# Patient Record
Sex: Female | Born: 1940
Health system: Southern US, Community
[De-identification: ages and names within clinical notes are randomized; demographics above are authoritative.]

## PROBLEM LIST (undated history)

## (undated) DIAGNOSIS — R102 Pelvic and perineal pain unspecified side: Secondary | ICD-10-CM

## (undated) DIAGNOSIS — M5136 Other intervertebral disc degeneration, lumbar region: Secondary | ICD-10-CM

## (undated) DIAGNOSIS — M5126 Other intervertebral disc displacement, lumbar region: Secondary | ICD-10-CM

## (undated) DIAGNOSIS — M51369 Other intervertebral disc degeneration, lumbar region without mention of lumbar back pain or lower extremity pain: Secondary | ICD-10-CM

## (undated) DIAGNOSIS — E039 Hypothyroidism, unspecified: Secondary | ICD-10-CM

## (undated) DIAGNOSIS — K219 Gastro-esophageal reflux disease without esophagitis: Secondary | ICD-10-CM

## (undated) DIAGNOSIS — E7800 Pure hypercholesterolemia, unspecified: Secondary | ICD-10-CM

## (undated) DIAGNOSIS — T148XXA Other injury of unspecified body region, initial encounter: Secondary | ICD-10-CM

## (undated) DIAGNOSIS — M8580 Other specified disorders of bone density and structure, unspecified site: Secondary | ICD-10-CM

## (undated) HISTORY — DX: Other specified disorders of bone density and structure, unspecified site: M85.80

## (undated) HISTORY — DX: Pure hypercholesterolemia, unspecified: E78.00

## (undated) HISTORY — PX: BLADDER SUSPENSION: SHX72

## (undated) HISTORY — DX: Hypothyroidism, unspecified: E03.9

---
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
## 1999-11-11 ENCOUNTER — Encounter: Payer: Self-pay | Admitting: *Deleted

## 1999-11-11 ENCOUNTER — Inpatient Hospital Stay (HOSPITAL_COMMUNITY): Admission: EM | Admit: 1999-11-11 | Discharge: 1999-11-12 | Payer: Self-pay | Admitting: *Deleted

## 2001-02-25 ENCOUNTER — Ambulatory Visit (HOSPITAL_COMMUNITY): Admission: RE | Admit: 2001-02-25 | Discharge: 2001-02-25 | Payer: Self-pay | Admitting: Family Medicine

## 2001-02-25 ENCOUNTER — Encounter: Payer: Self-pay | Admitting: Family Medicine

## 2001-03-04 ENCOUNTER — Other Ambulatory Visit: Admission: RE | Admit: 2001-03-04 | Discharge: 2001-03-04 | Payer: Self-pay | Admitting: Family Medicine

## 2001-04-17 ENCOUNTER — Encounter: Payer: Self-pay | Admitting: Family Medicine

## 2001-04-17 ENCOUNTER — Ambulatory Visit (HOSPITAL_COMMUNITY): Admission: RE | Admit: 2001-04-17 | Discharge: 2001-04-17 | Payer: Self-pay | Admitting: Family Medicine

## 2001-07-07 ENCOUNTER — Ambulatory Visit (HOSPITAL_COMMUNITY): Admission: RE | Admit: 2001-07-07 | Discharge: 2001-07-07 | Payer: Self-pay | Admitting: General Surgery

## 2002-02-19 ENCOUNTER — Encounter: Payer: Self-pay | Admitting: Family Medicine

## 2002-02-19 ENCOUNTER — Ambulatory Visit (HOSPITAL_COMMUNITY): Admission: RE | Admit: 2002-02-19 | Discharge: 2002-02-19 | Payer: Self-pay | Admitting: Family Medicine

## 2002-02-25 ENCOUNTER — Encounter: Payer: Self-pay | Admitting: Family Medicine

## 2002-02-25 ENCOUNTER — Ambulatory Visit (HOSPITAL_COMMUNITY): Admission: RE | Admit: 2002-02-25 | Discharge: 2002-02-25 | Payer: Self-pay | Admitting: Family Medicine

## 2002-03-24 ENCOUNTER — Encounter: Admission: RE | Admit: 2002-03-24 | Discharge: 2002-03-24 | Payer: Self-pay | Admitting: Neurosurgery

## 2002-03-24 ENCOUNTER — Encounter: Payer: Self-pay | Admitting: Neurosurgery

## 2002-04-13 ENCOUNTER — Encounter: Payer: Self-pay | Admitting: Neurosurgery

## 2002-04-13 ENCOUNTER — Encounter: Admission: RE | Admit: 2002-04-13 | Discharge: 2002-04-13 | Payer: Self-pay | Admitting: Neurosurgery

## 2003-05-09 ENCOUNTER — Encounter: Payer: Self-pay | Admitting: Family Medicine

## 2003-05-09 ENCOUNTER — Ambulatory Visit (HOSPITAL_COMMUNITY): Admission: RE | Admit: 2003-05-09 | Discharge: 2003-05-09 | Payer: Self-pay | Admitting: Family Medicine

## 2003-09-01 ENCOUNTER — Ambulatory Visit (HOSPITAL_COMMUNITY): Admission: RE | Admit: 2003-09-01 | Discharge: 2003-09-01 | Payer: Self-pay | Admitting: Pulmonary Disease

## 2003-09-05 ENCOUNTER — Emergency Department (HOSPITAL_COMMUNITY): Admission: EM | Admit: 2003-09-05 | Discharge: 2003-09-05 | Payer: Self-pay | Admitting: Emergency Medicine

## 2004-07-04 ENCOUNTER — Ambulatory Visit (HOSPITAL_COMMUNITY): Admission: RE | Admit: 2004-07-04 | Discharge: 2004-07-04 | Payer: Self-pay | Admitting: Family Medicine

## 2005-07-10 ENCOUNTER — Ambulatory Visit (HOSPITAL_COMMUNITY): Admission: RE | Admit: 2005-07-10 | Discharge: 2005-07-10 | Payer: Self-pay | Admitting: Pulmonary Disease

## 2005-08-20 ENCOUNTER — Other Ambulatory Visit: Admission: RE | Admit: 2005-08-20 | Discharge: 2005-08-20 | Payer: Self-pay | Admitting: Obstetrics and Gynecology

## 2006-08-28 ENCOUNTER — Ambulatory Visit (HOSPITAL_COMMUNITY): Admission: RE | Admit: 2006-08-28 | Discharge: 2006-08-28 | Payer: Self-pay | Admitting: Pulmonary Disease

## 2007-08-31 ENCOUNTER — Ambulatory Visit (HOSPITAL_COMMUNITY): Admission: RE | Admit: 2007-08-31 | Discharge: 2007-08-31 | Payer: Self-pay | Admitting: Family Medicine

## 2007-09-16 ENCOUNTER — Ambulatory Visit (HOSPITAL_COMMUNITY): Admission: RE | Admit: 2007-09-16 | Discharge: 2007-09-16 | Payer: Self-pay | Admitting: Family Medicine

## 2007-11-12 ENCOUNTER — Inpatient Hospital Stay (HOSPITAL_COMMUNITY): Admission: RE | Admit: 2007-11-12 | Discharge: 2007-11-13 | Payer: Self-pay | Admitting: Obstetrics and Gynecology

## 2007-12-26 ENCOUNTER — Emergency Department (HOSPITAL_COMMUNITY): Admission: EM | Admit: 2007-12-26 | Discharge: 2007-12-27 | Payer: Self-pay | Admitting: Emergency Medicine

## 2008-01-22 ENCOUNTER — Ambulatory Visit (HOSPITAL_COMMUNITY): Admission: RE | Admit: 2008-01-22 | Discharge: 2008-01-22 | Payer: Self-pay | Admitting: Orthopedic Surgery

## 2008-01-25 ENCOUNTER — Encounter (HOSPITAL_COMMUNITY): Admission: RE | Admit: 2008-01-25 | Discharge: 2008-02-24 | Payer: Self-pay | Admitting: Orthopedic Surgery

## 2008-02-05 ENCOUNTER — Ambulatory Visit (HOSPITAL_COMMUNITY): Admission: RE | Admit: 2008-02-05 | Discharge: 2008-02-05 | Payer: Self-pay | Admitting: Orthopedic Surgery

## 2008-09-07 ENCOUNTER — Ambulatory Visit (HOSPITAL_COMMUNITY): Admission: RE | Admit: 2008-09-07 | Discharge: 2008-09-07 | Payer: Self-pay | Admitting: Obstetrics and Gynecology

## 2008-12-26 ENCOUNTER — Ambulatory Visit (HOSPITAL_COMMUNITY): Admission: RE | Admit: 2008-12-26 | Discharge: 2008-12-26 | Payer: Self-pay | Admitting: Family Medicine

## 2009-10-02 ENCOUNTER — Ambulatory Visit (HOSPITAL_COMMUNITY): Admission: RE | Admit: 2009-10-02 | Discharge: 2009-10-02 | Payer: Self-pay | Admitting: Family Medicine

## 2009-11-16 ENCOUNTER — Ambulatory Visit (HOSPITAL_COMMUNITY): Admission: RE | Admit: 2009-11-16 | Discharge: 2009-11-16 | Payer: Self-pay | Admitting: Urology

## 2009-11-26 ENCOUNTER — Emergency Department (HOSPITAL_COMMUNITY): Admission: EM | Admit: 2009-11-26 | Discharge: 2009-11-26 | Payer: Self-pay | Admitting: Emergency Medicine

## 2009-11-27 ENCOUNTER — Ambulatory Visit (HOSPITAL_COMMUNITY): Admission: RE | Admit: 2009-11-27 | Discharge: 2009-11-27 | Payer: Self-pay | Admitting: Emergency Medicine

## 2010-01-24 ENCOUNTER — Encounter: Admission: RE | Admit: 2010-01-24 | Discharge: 2010-01-24 | Payer: Self-pay | Admitting: Obstetrics and Gynecology

## 2010-09-30 ENCOUNTER — Encounter: Payer: Self-pay | Admitting: Pulmonary Disease

## 2010-11-13 ENCOUNTER — Other Ambulatory Visit (HOSPITAL_COMMUNITY): Payer: Self-pay | Admitting: Family Medicine

## 2010-11-13 DIAGNOSIS — M8580 Other specified disorders of bone density and structure, unspecified site: Secondary | ICD-10-CM

## 2010-11-20 ENCOUNTER — Ambulatory Visit (HOSPITAL_COMMUNITY)
Admission: RE | Admit: 2010-11-20 | Discharge: 2010-11-20 | Disposition: A | Discharge status: Home / Self Care | Payer: Medicare Other | Source: Ambulatory Visit | Attending: Family Medicine | Admitting: Family Medicine

## 2010-11-20 DIAGNOSIS — M8580 Other specified disorders of bone density and structure, unspecified site: Secondary | ICD-10-CM

## 2010-11-20 DIAGNOSIS — M899 Disorder of bone, unspecified: Secondary | ICD-10-CM | POA: Insufficient documentation

## 2010-11-20 DIAGNOSIS — Z780 Asymptomatic menopausal state: Secondary | ICD-10-CM | POA: Insufficient documentation

## 2010-11-30 ENCOUNTER — Other Ambulatory Visit (HOSPITAL_COMMUNITY): Payer: Self-pay | Admitting: Family Medicine

## 2010-11-30 DIAGNOSIS — Z139 Encounter for screening, unspecified: Secondary | ICD-10-CM

## 2010-12-03 ENCOUNTER — Ambulatory Visit (HOSPITAL_COMMUNITY)
Admission: RE | Admit: 2010-12-03 | Discharge: 2010-12-03 | Disposition: A | Discharge status: Home / Self Care | Payer: Medicare Other | Source: Ambulatory Visit | Attending: Family Medicine | Admitting: Family Medicine

## 2010-12-03 DIAGNOSIS — Z139 Encounter for screening, unspecified: Secondary | ICD-10-CM

## 2010-12-03 DIAGNOSIS — Z1231 Encounter for screening mammogram for malignant neoplasm of breast: Secondary | ICD-10-CM | POA: Insufficient documentation

## 2010-12-03 LAB — DIFFERENTIAL
Basophils Absolute: 0 10*3/uL (ref 0.0–0.1)
Basophils Relative: 1 % (ref 0–1)
Eosinophils Absolute: 0.2 10*3/uL (ref 0.0–0.7)
Eosinophils Relative: 3 % (ref 0–5)
Monocytes Absolute: 0.6 10*3/uL (ref 0.1–1.0)
Monocytes Relative: 11 % (ref 3–12)
Neutro Abs: 2.7 10*3/uL (ref 1.7–7.7)

## 2010-12-03 LAB — BASIC METABOLIC PANEL
CO2: 25 mEq/L (ref 19–32)
Calcium: 9.7 mg/dL (ref 8.4–10.5)
Chloride: 101 mEq/L (ref 96–112)
GFR calc Af Amer: >60 mL/min (ref >60)
Glucose, Bld: 91 mg/dL (ref 70–99)
Sodium: 138 mEq/L (ref 135–145)

## 2010-12-03 LAB — HEPATIC FUNCTION PANEL
Albumin: 4.3 g/dL (ref 3.5–5.2)
Alkaline Phosphatase: 40 U/L (ref 39–117)
Bilirubin, Direct: 0.1 mg/dL (ref 0.0–0.3)
Indirect Bilirubin: 0.9 mg/dL (ref 0.3–0.9)
Total Bilirubin: 1 mg/dL (ref 0.3–1.2)

## 2010-12-03 LAB — CBC
Hemoglobin: 14.2 g/dL (ref 12.0–15.0)
MCHC: 34.6 g/dL (ref 30.0–36.0)
MCV: 91 fL (ref 78.0–100.0)
RBC: 4.51 MIL/uL (ref 3.87–5.11)
RDW: 12.5 % (ref 11.5–15.5)

## 2010-12-03 LAB — LIPASE, BLOOD: Lipase: 24 U/L (ref 11–59)

## 2010-12-03 LAB — POCT CARDIAC MARKERS: Troponin i, poc: <0.05 ng/mL (ref 0.00–0.09)

## 2011-01-22 NOTE — Op Note (Signed)
NAME:  Terry Allen, Terry Allen         ACCOUNT NO.:  [id number]   MEDICAL RECORD NO.:  [id number]          PATIENT TYPE:  OIB   LOCATION:  9302                          FACILITY:  [facility]   PHYSICIAN:  Daleen Bo. Gaetano Net, M.D. DATE OF BIRTH:  20-May-1941   DATE OF PROCEDURE:  DATE OF DISCHARGE:                               OPERATIVE REPORT   PREOPERATIVE DIAGNOSES:  1. Pelvic relaxation.  2. Stress urinary incontinence   POSTOPERATIVE DIAGNOSES:  1. Pelvic relaxation.  2. Stress urinary incontinence.   PROCEDURE:  Anterior vaginal repair with Pinnacle graft, transobturator  mid urethral tape, posterior vaginal repair, and colpopexy.   SURGEON:  Everlene Farrier, MD.   ASSISTANT:  Evette Cristal, MD.   ANESTHESIA:  General with LMA.   ESTIMATED BLOOD LOSS:  100 mL.   INDICATIONS AND CONSENT:  The patient is a 70 year old divorced white  female G1, P1 with complaints of leaking urine as well as discomfort  vaginally.  Details are dictated in the history and physical.  Anterior-  posterior vaginal repair with grafts and a mid urethral sling is  discussed preoperatively.  Potential risks and complications have been  reviewed preoperatively including but not limited to infection, organ  damage, bleeding requiring transfusion of blood products with possible  HIV and hepatitis acquisition, DVT, PE, pneumonia, delayed healing,  erosion, vaginal narrowing, pelvic pain, recurrent relaxation, recurrent  stress urinary incontinence, perineal pain.  Possible prolonged  catheterization and need to return to the OR has also been reviewed.  All questions have been answered and consent is signed on the chart.   PROCEDURE:  The patient is taken to operating room, where she is  identified, placed in dorsal supine position and general anesthesia is  induced via LMA.  She is then placed in the dorsal lithotomy position,  where she is prepped, the bladder is straight catheterized and she is  draped in a  sterile fashion.  The anterior vaginal wall is injected with  0.50% lidocaine with 1:200,000 epinephrine.  The midline incision is  then made approximately 4 cm below the urethral meatus down to a point  about 2 cm superior to the cervix.  This dissection is carried out  bilaterally sharply and bluntly.  The ischial spines and the  sacrospinous ligament can be palpated bilaterally.  Then using the  Pinnacle graft with the Capio needle passer, the graft is passed through  the sacrospinous ligaments bilaterally.  This was done at least 1  fingerbreadth away from the spine.  The white arms are then passed  bilaterally through the white line approximately 1 fingerbreadths away  from the ischial spine as well.  These respective anchors are then  pulled through the tissue which passes the arms of the graft as well.  Prior to placement the posterior half of the graft had been trimmed  away.  After the graft is seated in place, a single 0 Monocryl stitch is  used to anchor the superior portion of the graft in the midline to the  vaginal mucosa.  The distal portion of the graft is trimmed to fit as  well and a single  suture is used in the midline as well distally to hold  it in place.  After it is properly seated, the sheaths are removed.  Excess polypropylene on the arms is trimmed.  The vaginal mucosa is then  closed with a running locking 2-0 Monocryl suture.  A Foley catheter is  placed in the bladder.  The bladder is drained and the catheter is left  in place.  The vaginal mucosa below the urethra, as well as the perineum  at the injection sites, are injected with the same solution.  The  suburethral midline incision is made.  Dissection is carried out  bilaterally to the urogenital diaphragm.  Then using the halo needle,  the needles were passed through the obturator foramen with the passage  of the needle tip being guided by the examining finger bilaterally.  After passage of the needles the  Foley catheter is removed.  Cystoscopy  is then carried out with a 70-degree scope.  Inspection in a 360-degree  manner reveals the bladder to be intact with no evidence of perforation  or foreign body.  A good puff of urine is noted bilaterally from the  ureters.  The cystoscope is removed.  Foley catheter is replaced.  The  bladder is drained and the catheter is left in place.  The polypropylene  mesh is then attached to the needles and they are withdrawn bilaterally  through the perineal incisions.  The sheath is then removed.  Careful  examination reveals zero tension on the graft.  It is flat and there is  at least 2-3 mm below the urethra to the graft.  Excess arms are trimmed  at the level of the skin.  The vaginal mucosa is closed in a running  locking fashion with 2-0 Monocryl suture.  Dermabond is placed on the  skin incisions.  Examination posteriorly revealed some laxity to the  posterior rectovaginal septum.  A small wedge of tissue is then removed  from the perineal body.  Dissection is carried out the midline and  bilaterally, and 0 Monocryl suture is used to plicate the fascia in the  midline.  The mucosa is closed again in a running fashion with locking 2-  0 Monocryl suture.  One inch of vaginal packing with estrogen cream is  placed.  All counts are correct.  The patient is awakened, taken to  recovery room in stable condition.      Daleen Bo Gaetano Net, M.D.  Electronically Signed     JET/MEDQ  D:  11/11/2007  T:  11/11/2007  Job:  [id number]

## 2011-01-22 NOTE — H&P (Signed)
NAME:  Terry Allen, Terry Allen         ACCOUNT NO.:  [id number]   MEDICAL RECORD NO.:  [id number]          PATIENT TYPE:  AMB   LOCATION:  [place]                           FACILITY:  [facility]   PHYSICIAN:  Daleen Bo. Gaetano Net, M.D. DATE OF BIRTH:  Jul 08, 1941   DATE OF ADMISSION:  11/11/2007  DATE OF DISCHARGE:                              HISTORY & PHYSICAL   CHIEF COMPLAINT:  Pelvic relaxation and urinary incontinence.   HISTORY OF PRESENT ILLNESS:  This patient is a 70 year old, divorced,  white female, G1, P1 who complains of leaking urine with coughing or  sneezing.  Urodynamic studies are consistent with stress urinary  incontinence.  On examination, she also is found to have pelvic  relaxation.  After discussion of options, she is being admitted for  anterior and posterior colporrhaphy with grafts and a TOT mid urethral  sling.  The potential risks and complications have been discussed  preoperatively.   PAST MEDICAL HISTORY:  1. Hyperlipidemia.  2. Hypothyroidism.   PAST SURGICAL HISTORY:  Negative.   FAMILY HISTORY:  Positive for diabetes in mother, cancer in father,  chronic hypertension in mother, stroke and heart disease in parents,  lung disease in father.   MEDICATIONS:  1. Simvastatin 40 mg daily.  2. Levothyroxine 75 mcg daily.   ALLERGIES:  No known drug allergies.   SOCIAL HISTORY:  Denies tobacco, alcohol or drug abuse   REVIEW OF SYSTEMS:  NEURO:  Denies headache.  CARDIO:  Denies chest  pain.  PULMONARY:  Denies shortness of breath. GI:  Denies recent  changes in bowel habits.   PHYSICAL EXAMINATION:  Height 5 feet 1-3/4 inches, weight 131 pounds,  blood pressure 136/80.  HEENT:  Without thyromegaly.  LUNGS:  Clear to auscultation.  HEART:  Regular rate and rhythm.  BACK:  Without CVA tenderness.  BREASTS:  Without mass or discharge.  ABDOMEN:  Soft, nontender without masses.  PELVIC:  Vulva, vagina and cervix without lesion.  The anterior vaginal  wall  presents at the vaginal introitus.  The uterus is normal size,  mobile with fair support.  Adnexa nontender without masses.  Rectovaginal exam reveals adequate rectal sphincter tone.  Rectovaginal  septum is attenuated.  EXTREMITIES:  Grossly within normal limits.  NEUROLOGICAL:  Grossly within normal limits.   ASSESSMENT:  Pelvic relaxation and stress urinary continence.   PLAN:  Anterior-posterior vaginal repair with graft and a transobturator  mid urethral sling.      Daleen Bo Gaetano Net, M.D.  Electronically Signed     JET/MEDQ  D:  11/04/2007  T:  11/05/2007  Job:  [id number]

## 2011-01-22 NOTE — Discharge Summary (Signed)
NAME:  Terry Allen, Terry Allen         ACCOUNT NO.:  [id number]   MEDICAL RECORD NO.:  [id number]          PATIENT TYPE:  INP   LOCATION:  9302                          FACILITY:  [facility]   PHYSICIAN:  Daleen Bo. Gaetano Net, M.D. DATE OF BIRTH:  June 01, 1941   DATE OF ADMISSION:  11/11/2007  DATE OF DISCHARGE:  11/13/2007                               DISCHARGE SUMMARY   ADMITTING DIAGNOSIS:  1. Pelvic relaxation.  2. Stress urinary continence.   DISCHARGE DIAGNOSIS:  1. Pelvic relaxation.  2. Stress urinary continence.   PROCEDURE:  On November 11, 2007 anterior colporrhaphy with pinnacle graft,  transobturator mid urethral sling, posterior colporrhaphy and colpopexy.   REASON FOR ADMISSION:  The patient is 69 year old white female with  symptoms of stress urinary continence and pelvic relaxation.  The  details are dictated in the history and physical.  She is admitted for  definitive surgical management.   HOSPITAL COURSE:  The patient is admitted to the hospital and undergoes  the above procedure.  On the evening of surgery, she has good pain  control, stable vital signs with clear urine output.  On the first  postoperative day, she is tolerating a regular diet and passing flatus  with good pain control.  Hemoglobin is 11.6.  Foley catheter was out  that morning.  Vital signs are stable and she is afebrile.  At one point  during the day, she was unable to void.  She was straight catheterized  for approximately 600 mL of urine.  However, following that she has done  well with all residual under 200 mL. She feels good.  She is ambulating  well, tolerating a regular diet. Vital signs remain stable and she is  afebrile.   CONDITION ON DISCHARGE:  Good.   DIET:  Regular as tolerated.   ACTIVITY:  No lifting, no operation of automobiles, no vaginal entry.  She is to call the office for problems including but not limited to  temperature of 101 degrees, heavy vaginal bleeding, persistent  nausea,  vomiting or increasing pain.  Double voiding instructions were reviewed  as well.   MEDICATIONS:  1. Ibuprofen 600 mg q.6 h p.r.n.  2. Percocet 5/325 #40 one to two p.o. q.6 h p.r.n.  3. Colace daily.  4. Multivitamin.   Follow-up is in the office in 2 weeks.      Daleen Bo Gaetano Net, M.D.  Electronically Signed     JET/MEDQ  D:  11/13/2007  T:  11/14/2007  Job:  [id number]

## 2011-01-25 NOTE — Cardiovascular Report (Signed)
[place]. [facility]  Patient:    Terry Allen, Terry Allen                 MRN: [id number] Proc. Date: 11/12/99 Adm. Date:  [id number] Disc. Date: [id number] Attending:  Octavia Heir CC:         Octavia Heir, M.D.             Leslie Andrea, M.D.                        Cardiac Catheterization  PROCEDURES: 1. Left heart catheterization. 2. Coronary angiography. 3. Left ventriculogram.  COMPLICATIONS:  None.  INDICATIONS:  Ms. Niang is a 70 year old white female with a history of hypercholesterolemia and positive family history of CAD, who presented to [facility] Room on November 11, 1999, complaining of substernal chest pain radiating into her left neck.  This was unrelieved with sublingual nitroglycerin. She had no ECG changes.  She was started on IV heparin, given O2 and IV morphine with the relief of her pain.  She is now transferred for cardiac catheterization.  DESCRIPTION OF PROCEDURE:  After given informed written consent, the patient was brought to the cardiac catheterization lab where her right and left groins were  shaved, prepped, and draped in the usual sterile fashion.  ECG monitoring was established.  Using modified Seldinger technique, a #6 French arterial sheath was inserted into the right femoral artery.  A 6 French diagnostic catheter was then used to perform diagnostic angiography.  This revealed a medium sized left main with no significant disease.  The LAD is a medium sized vessel which coursed to the apex and gave rise to two  diagonal branches.  The LAD was noted to have a 20% mid vessel stenotic lesion ust after the takeoff of the second diagonal.  The first and second diagonals are medium sized vessels with no significant disease.  The left circumflex is a medium sized vessel which coursed in the AV groove that gave rise to one obtuse marginal branch.  The AV groove circumflex has no significant  disease.  The first OM is a medium sized vessel with no significant  disease.  The right coronary artery is a medium sized vessel which is dominant and gives ise to both the PDA as well as the posterolateral branch.  The RCA, PDA, and posterolateral branch had no significant disease.  LEFT VENTRICULOGRAM:  The left ventriculogram reveals a preserved EF calculated at 60%.  There is no significant MR.  HEMODYNAMICS:  Systemic arterial pressure 106/58, LV systemic pressure 106/110.  LVEDP of 16.  A Perclose device was then used to close the right femoral artery without complications.  Ancef 1 g was given prophylactically.  CONCLUSIONS: 1. Essentially normal coronary arteries. 2. Normal left ventricular systolic function. 3. Successful closure of the right femoral artery using a Perclose device. DD:  11/12/99 TD:  11/13/99 Job: [id number]

## 2011-01-25 NOTE — H&P (Signed)
[place]. [facility]  Patient:    Terry Allen, Terry Allen                   MRN: [id number] Adm. Date:  11/11/99 Attending:  Alla German, M.D. Dictator:   Otilio Carpen. Dorene Ar, F.N.P.C. CC:         Alla German, M.D.             Dr. Joen Laura, [place], [place]                         History and Physical  CHIEF COMPLAINT:  Chest pain.  HISTORY OF PRESENT ILLNESS:  The patient was transferred from [facility] after presenting to the emergency room there earlier today.  She is a 70 year old white female with treatment of high cholesterol and positive family history of coronary artery disease who presented to [facility] Emergency Room this evening with substernal chest pain and radiation to the left neck.  Resolved with sublingual nitroglycerin.  She had no EKG changes.  The patient was seen in the  office by Dr. Tami Ribas earlier this week with a normal EKG and scheduled for outpatient echocardiogram and Cardiolite for tomorrow.  On 11/11/99, the patient developed pain, lasted approximately two hours and resolved spontaneously.  The pain returned at noon, and the patient presented to the ER t that time.  She had no associated symptoms, was started on heparin and nitroglycerin.  No previous history of coronary disease, no congestive heart failure.  She has had chest discomfort off and on for two weeks, but more frequent episodes this past week.  OUTPATIENT MEDICATIONS: 1. Evista 60 mg daily. 2. Aspirin 325 mg daily. 3. Zocor 20 mg daily. 4. Synthroid 0.075 mg daily. 5. Calcium. 6. Vitamin E.  ALLERGIES:  No known drug allergies.  SOCIAL HISTORY:  Married.  Does not use tobacco or alcohol.  Employed at [employer].  FAMILY HISTORY:  Mother died in her 28s with myocardial infarction and cerebrovascular accident.  PHYSICAL EXAMINATION:  VITAL SIGNS:  Temperature 98.8, pulse 74, respirations 20, blood  pressure 135/72, weight 58.1 kg.  GENERAL:  Alert and oriented white female in no acute distress.  HEENT:  Pupils are equal, round and reactive to light.  Extraocular movements intact.  Pharynx clear.  NECK:  Supple, no JVD, no bruits.  CHEST:  Clear to auscultation bilaterally.  HEART:  Regular rate and rhythm, S1, S2, no S3 or gallop.  ABDOMEN:  Soft, nontender, positive bowel sounds, negative for pulsatile masses.  EXTREMITIES:  No edema, 2+ pedal pulses bilaterally.  LABORATORY DATA:  EKG showed sinus rhythm, a rate of 77, no ST or T-wave changes, questionable old myocardial infarction.  Pleural chest x-ray pending. Hemoglobin 13, hematocrit 40, platelets 249, white blood cell count 6.2, sodium 140, potassium 4.1, BUN 16, creatinine 0.7, glucose 86.  CK 55, troponin-I was negative, MB was 1.  IMPRESSION: 1. Chest pain, rule out cardiac disease, rule out myocardial infarction. 2. Positive hyperlipidemia. 3. Positive family history.  PLAN:  IV heparin and IV nitroglycerin.  Plan for a cardiac catheterization in he morning.  Dr. Tami Ribas saw her and assessed her. DD:  11/11/99 TD:  11/11/99 Job: [id number]

## 2011-01-25 NOTE — Discharge Summary (Signed)
[place]. [facility]  Patient:    Terry Allen, Terry Allen                 MRN: [id number] Adm. Date:  [id number] Disc. Date: [id number] Attending:  Octavia Heir Dictator:   Otilio Carpen. Dorene Ar, F.N.P.C. CC:         Alla German, M.D.             Leslie Andrea, M.D., c/o [facility], [place],                           Discharge Summary  DISCHARGE DIAGNOSES: 1. Chest pain.    a. Negative for myocardial infarction.    b. Normal coronary arteries.    c. Normal left ventricular function. 2. Hyperlipidemia. 3. Positive family history for coronary disease.  DISCHARGE CONDITION:  Improved.  PROCEDURE:  November 12, 1999, combined left heart catheterization by Dr. Alla German.  DISCHARGE MEDICATIONS: 1. Prevacid 30 mg one tablet twice a day. 2. Zocor 20 mg one every evening. 3. Evista 60 mg every evening. 4. Synthroid 75 mcg daily. 5. Hold aspirin for now until you see Dr. Leslie Andrea. 6. Vitamin E as before. 7. Darvocet-N 100 one or two every four to six hours as needed for pain.  DISCHARGE INSTRUCTIONS: 1. No strenuous activity, no lifting over 10 pounds and no sexual activity for    three days. 2. Low-fat, low-salt diet. 3. May shower.  If any bleeding, swelling or drainage at cath site, call the    office.  Do not take tub baths for one week. 4. Proceed with echocardiogram as previously instructed, Thursday at 11 a.m. 5. Follow up with Dr. Karie Kirks this week for possible referral to GI    physician.  HISTORY OF PRESENT ILLNESS:  Patient was transferred from [facility] after presenting there and to the emergency room on November 11, 1999.  She is a 70 year old white female with treatment of high cholesterol and strong family history of coronary disease.  She had seen Dr. Alla German earlier the week of admission with a normal EKG and was scheduled for outpatient echocardiogram and Cardiolite.  On the morning of November 11, 1999, she developed chest pain lasting two hours and resolved spontaneously.  The pain then returned at noon and the patient presented to the ER at [facility].  No associated symptoms.  She was started on IV heparin and IV nitroglycerin.  She had no previous history of coronary disease and no congestive failure.  She has had chest discomfort off and on for two weeks, but more frequent episodes this past week.  OUTPATIENT MEDICATIONS: 1. Evista 60 mg daily. 2. Aspirin 325 mg daily. 3. Zocor 20 mg daily. 4. Synthroid 75 mcg daily. 5. Calcium daily. 6. Vitamin E daily.  ALLERGIES:  No known allergies.  SOCIAL HISTORY AND FAMILY HISTORY:  Please see H&P.  PHYSICAL EXAMINATION AT DISCHARGE:  Blood pressure 104/64, pulse 68, respirations 20, temperature 97.  Room air oxygen saturation 95%.  GENERAL: Alert and oriented white female in no acute distress.  SKIN:  Warm and dry with brisk capillary refill.  LUNGS:  Clear, without rales, rhonchi or wheezing.  ABDOMEN:  Right groin wound is stable.  No hematoma.  EXTREMITIES: Pedals 2+ bilaterally.  She had ambulated without difficulty.  LABORATORY AND X-RAY FINDINGS:  Hemoglobin 11.5, hematocrit 34, WBC 5.8, MCV 92, platelets 210,000.  Pro time 14.5, INR  of 1.3, PTT on heparin was greater than 200.  Chemistries:  Sodium 143, potassium 4.2, chloride 105, CO2 31, glucose 97, BUN 15, creatinine 0.7, calcium 8.8.  Cardiac enzymes:  CK 63, MB 0.7 and troponin less than 0.03 x 2.  Cholesterol levels:  Cholesterol 227, triglycerides 108, HDL 63, LDL 142.  Chest x-ray:  Prominent right hilum, may be vascular in origin; however, PA and lateral chest recommended for further delineation.  EKG:  Sinus bradycardia; otherwise, normal EKG.  Cardiac catheterization, November 12, 1999, revealed 20% LAD stenosis just distal to the second diagonal.  EF was 60%.  HOSPITAL COURSE:  Ms. Harmelink was admitted November 11, 1999, after being transferred from [facility] for substernal chest pain.  She was admitted on IV heparin, IV nitroglycerin and planned for cardiac catheterization on November 12, 1999, which she underwent, and findings were as above.  She was stable and by the evening of November 12, 1999, was discharged home after she ambulated without difficulty.  She was placed on Prevacid and a GI cocktail was given and she was to follow up with her primary care physician.  ADDENDUM:  The GI cocktail did relieve her discomfort. DD:  01/27/00 TD:  01/30/00 Job: [id number]

## 2011-02-25 ENCOUNTER — Ambulatory Visit (HOSPITAL_COMMUNITY)
Admission: RE | Admit: 2011-02-25 | Discharge: 2011-02-25 | Disposition: A | Discharge status: Home / Self Care | Payer: Medicare Other | Source: Ambulatory Visit | Attending: Orthopedic Surgery | Admitting: Orthopedic Surgery

## 2011-02-25 DIAGNOSIS — R262 Difficulty in walking, not elsewhere classified: Secondary | ICD-10-CM | POA: Insufficient documentation

## 2011-02-25 DIAGNOSIS — M25569 Pain in unspecified knee: Secondary | ICD-10-CM | POA: Insufficient documentation

## 2011-02-25 DIAGNOSIS — IMO0001 Reserved for inherently not codable concepts without codable children: Secondary | ICD-10-CM | POA: Insufficient documentation

## 2011-02-25 DIAGNOSIS — M6281 Muscle weakness (generalized): Secondary | ICD-10-CM | POA: Insufficient documentation

## 2011-06-03 LAB — COMPREHENSIVE METABOLIC PANEL
Albumin: 3.6
Alkaline Phosphatase: 45
BUN: 14
Creatinine, Ser: 0.76
Glucose, Bld: 100 — ABNORMAL HIGH
Potassium: 3 — ABNORMAL LOW
Total Protein: 6.7

## 2011-06-03 LAB — CBC
HCT: 41.3
Hemoglobin: 14.1
MCHC: 34.2
MCHC: 34.6
MCV: 91.7
Platelets: 196
RBC: 3.67 — ABNORMAL LOW
RDW: 12.7
RDW: 13

## 2011-06-03 LAB — URINE MICROSCOPIC-ADD ON

## 2011-06-03 LAB — URINALYSIS, ROUTINE W REFLEX MICROSCOPIC
Glucose, UA: NEGATIVE
Leukocytes, UA: NEGATIVE
Protein, ur: NEGATIVE
Specific Gravity, Urine: <1.005 — ABNORMAL LOW
Urobilinogen, UA: 0.2

## 2011-11-19 ENCOUNTER — Telehealth: Payer: Self-pay | Admitting: *Deleted

## 2011-11-19 NOTE — Telephone Encounter (Signed)
A user error has taken place: wrong patient

## 2011-12-14 ENCOUNTER — Other Ambulatory Visit (HOSPITAL_COMMUNITY): Payer: Self-pay | Admitting: Family Medicine

## 2011-12-14 DIAGNOSIS — Z0000 Encounter for general adult medical examination without abnormal findings: Secondary | ICD-10-CM

## 2011-12-17 ENCOUNTER — Ambulatory Visit (HOSPITAL_COMMUNITY)
Admission: RE | Admit: 2011-12-17 | Discharge: 2011-12-17 | Disposition: A | Discharge status: Home / Self Care | Payer: Medicare Other | Source: Ambulatory Visit | Attending: Family Medicine | Admitting: Family Medicine

## 2011-12-17 DIAGNOSIS — Z0000 Encounter for general adult medical examination without abnormal findings: Secondary | ICD-10-CM

## 2011-12-17 DIAGNOSIS — Z1231 Encounter for screening mammogram for malignant neoplasm of breast: Secondary | ICD-10-CM | POA: Insufficient documentation

## 2012-02-24 ENCOUNTER — Other Ambulatory Visit (HOSPITAL_COMMUNITY): Payer: Self-pay | Admitting: Family Medicine

## 2012-02-24 ENCOUNTER — Ambulatory Visit (HOSPITAL_COMMUNITY)
Admission: RE | Admit: 2012-02-24 | Discharge: 2012-02-24 | Disposition: A | Discharge status: Home / Self Care | Payer: Medicare Other | Source: Ambulatory Visit | Attending: Family Medicine | Admitting: Family Medicine

## 2012-02-24 DIAGNOSIS — J209 Acute bronchitis, unspecified: Secondary | ICD-10-CM

## 2012-02-24 DIAGNOSIS — R059 Cough, unspecified: Secondary | ICD-10-CM | POA: Insufficient documentation

## 2012-02-24 DIAGNOSIS — R0602 Shortness of breath: Secondary | ICD-10-CM | POA: Insufficient documentation

## 2012-02-24 DIAGNOSIS — R05 Cough: Secondary | ICD-10-CM | POA: Insufficient documentation

## 2012-10-20 ENCOUNTER — Other Ambulatory Visit (HOSPITAL_COMMUNITY): Payer: Self-pay | Admitting: Family Medicine

## 2012-10-20 ENCOUNTER — Ambulatory Visit (HOSPITAL_COMMUNITY)
Admission: RE | Admit: 2012-10-20 | Discharge: 2012-10-20 | Disposition: A | Discharge status: Home / Self Care | Payer: Medicare Other | Source: Ambulatory Visit | Attending: Family Medicine | Admitting: Family Medicine

## 2012-10-20 DIAGNOSIS — M5430 Sciatica, unspecified side: Secondary | ICD-10-CM

## 2012-10-20 DIAGNOSIS — M545 Low back pain, unspecified: Secondary | ICD-10-CM | POA: Insufficient documentation

## 2012-10-20 DIAGNOSIS — M5137 Other intervertebral disc degeneration, lumbosacral region: Secondary | ICD-10-CM | POA: Insufficient documentation

## 2012-10-20 DIAGNOSIS — M79609 Pain in unspecified limb: Secondary | ICD-10-CM | POA: Insufficient documentation

## 2012-10-20 DIAGNOSIS — M51379 Other intervertebral disc degeneration, lumbosacral region without mention of lumbar back pain or lower extremity pain: Secondary | ICD-10-CM | POA: Insufficient documentation

## 2012-11-04 ENCOUNTER — Other Ambulatory Visit (HOSPITAL_COMMUNITY): Payer: Self-pay | Admitting: Family Medicine

## 2012-11-04 DIAGNOSIS — M5430 Sciatica, unspecified side: Secondary | ICD-10-CM

## 2012-11-09 ENCOUNTER — Ambulatory Visit (HOSPITAL_COMMUNITY)
Admission: RE | Admit: 2012-11-09 | Discharge: 2012-11-09 | Disposition: A | Discharge status: Home / Self Care | Payer: Medicare Other | Source: Ambulatory Visit | Attending: Family Medicine | Admitting: Family Medicine

## 2012-11-09 DIAGNOSIS — M545 Low back pain, unspecified: Secondary | ICD-10-CM | POA: Insufficient documentation

## 2012-11-09 DIAGNOSIS — M5126 Other intervertebral disc displacement, lumbar region: Secondary | ICD-10-CM | POA: Insufficient documentation

## 2012-11-09 DIAGNOSIS — M5430 Sciatica, unspecified side: Secondary | ICD-10-CM

## 2012-11-09 DIAGNOSIS — M79609 Pain in unspecified limb: Secondary | ICD-10-CM | POA: Insufficient documentation

## 2012-11-17 ENCOUNTER — Other Ambulatory Visit: Payer: Self-pay | Admitting: Family Medicine

## 2012-11-17 DIAGNOSIS — M5430 Sciatica, unspecified side: Secondary | ICD-10-CM

## 2012-11-19 ENCOUNTER — Ambulatory Visit
Admission: RE | Admit: 2012-11-19 | Discharge: 2012-11-19 | Disposition: A | Discharge status: Home / Self Care | Payer: Medicare Other | Source: Ambulatory Visit | Attending: Family Medicine | Admitting: Family Medicine

## 2012-11-19 ENCOUNTER — Other Ambulatory Visit: Payer: Medicare Other

## 2012-11-19 VITALS — BP 142/73 | HR 66 | Ht 62.0 in | Wt 132.0 lb

## 2012-11-19 DIAGNOSIS — M5126 Other intervertebral disc displacement, lumbar region: Secondary | ICD-10-CM

## 2012-11-19 DIAGNOSIS — M5430 Sciatica, unspecified side: Secondary | ICD-10-CM

## 2012-11-19 MED ORDER — METHYLPREDNISOLONE ACETATE 40 MG/ML INJ SUSP (RADIOLOG
120.0000 mg | Freq: Once | INTRAMUSCULAR | Status: AC
  Administered 2012-11-19: 120 mg via EPIDURAL

## 2012-11-19 MED ORDER — IOHEXOL 180 MG/ML  SOLN
1.0000 mL | Freq: Once | INTRAMUSCULAR | Status: AC | PRN
  Administered 2012-11-19: 1 mL via EPIDURAL

## 2012-12-08 ENCOUNTER — Other Ambulatory Visit: Payer: Self-pay | Admitting: Family Medicine

## 2012-12-08 DIAGNOSIS — M549 Dorsalgia, unspecified: Secondary | ICD-10-CM

## 2012-12-10 ENCOUNTER — Ambulatory Visit
Admission: RE | Admit: 2012-12-10 | Discharge: 2012-12-10 | Disposition: A | Discharge status: Home / Self Care | Payer: Medicare Other | Source: Ambulatory Visit | Attending: Family Medicine | Admitting: Family Medicine

## 2012-12-10 VITALS — BP 130/66 | HR 63

## 2012-12-10 DIAGNOSIS — M549 Dorsalgia, unspecified: Secondary | ICD-10-CM

## 2012-12-10 MED ORDER — IOHEXOL 180 MG/ML  SOLN
1.0000 mL | Freq: Once | INTRAMUSCULAR | Status: AC | PRN
  Administered 2012-12-10: 1 mL via EPIDURAL

## 2012-12-10 MED ORDER — METHYLPREDNISOLONE ACETATE 40 MG/ML INJ SUSP (RADIOLOG
120.0000 mg | Freq: Once | INTRAMUSCULAR | Status: AC
  Administered 2012-12-10: 120 mg via EPIDURAL

## 2013-01-11 ENCOUNTER — Other Ambulatory Visit (HOSPITAL_COMMUNITY): Payer: Self-pay | Admitting: Family Medicine

## 2013-01-11 DIAGNOSIS — Z139 Encounter for screening, unspecified: Secondary | ICD-10-CM

## 2013-01-12 ENCOUNTER — Ambulatory Visit (HOSPITAL_COMMUNITY)
Admission: RE | Admit: 2013-01-12 | Discharge: 2013-01-12 | Disposition: A | Discharge status: Home / Self Care | Payer: Medicare Other | Source: Ambulatory Visit | Attending: Family Medicine | Admitting: Family Medicine

## 2013-01-12 DIAGNOSIS — Z1231 Encounter for screening mammogram for malignant neoplasm of breast: Secondary | ICD-10-CM | POA: Insufficient documentation

## 2013-01-12 DIAGNOSIS — Z139 Encounter for screening, unspecified: Secondary | ICD-10-CM

## 2013-01-21 ENCOUNTER — Other Ambulatory Visit: Payer: Self-pay | Admitting: Family Medicine

## 2013-01-21 DIAGNOSIS — M549 Dorsalgia, unspecified: Secondary | ICD-10-CM

## 2013-01-26 ENCOUNTER — Ambulatory Visit
Admission: RE | Admit: 2013-01-26 | Discharge: 2013-01-26 | Disposition: A | Discharge status: Home / Self Care | Payer: Medicare Other | Source: Ambulatory Visit | Attending: Family Medicine | Admitting: Family Medicine

## 2013-01-26 DIAGNOSIS — M549 Dorsalgia, unspecified: Secondary | ICD-10-CM

## 2013-01-26 MED ORDER — METHYLPREDNISOLONE ACETATE 40 MG/ML INJ SUSP (RADIOLOG
120.0000 mg | Freq: Once | INTRAMUSCULAR | Status: AC
  Administered 2013-01-26: 120 mg via EPIDURAL

## 2013-01-26 MED ORDER — IOHEXOL 180 MG/ML  SOLN
1.0000 mL | Freq: Once | INTRAMUSCULAR | Status: AC | PRN
  Administered 2013-01-26: 1 mL via EPIDURAL

## 2013-06-21 ENCOUNTER — Emergency Department (HOSPITAL_COMMUNITY)
Admission: EM | Admit: 2013-06-21 | Discharge: 2013-06-21 | Disposition: A | Discharge status: Home / Self Care | Payer: Medicare Other | Attending: Emergency Medicine | Admitting: Emergency Medicine

## 2013-06-21 ENCOUNTER — Encounter (HOSPITAL_COMMUNITY): Payer: Self-pay | Admitting: Emergency Medicine

## 2013-06-21 DIAGNOSIS — R5383 Other fatigue: Secondary | ICD-10-CM | POA: Insufficient documentation

## 2013-06-21 DIAGNOSIS — Z8719 Personal history of other diseases of the digestive system: Secondary | ICD-10-CM | POA: Insufficient documentation

## 2013-06-21 DIAGNOSIS — R109 Unspecified abdominal pain: Secondary | ICD-10-CM | POA: Insufficient documentation

## 2013-06-21 DIAGNOSIS — R197 Diarrhea, unspecified: Secondary | ICD-10-CM | POA: Insufficient documentation

## 2013-06-21 DIAGNOSIS — Z8739 Personal history of other diseases of the musculoskeletal system and connective tissue: Secondary | ICD-10-CM | POA: Insufficient documentation

## 2013-06-21 DIAGNOSIS — R5381 Other malaise: Secondary | ICD-10-CM | POA: Insufficient documentation

## 2013-06-21 HISTORY — DX: Gastro-esophageal reflux disease without esophagitis: K21.9

## 2013-06-21 HISTORY — DX: Other intervertebral disc displacement, lumbar region: M51.26

## 2013-06-21 HISTORY — DX: Other injury of unspecified body region, initial encounter: T14.8XXA

## 2013-06-21 HISTORY — DX: Other intervertebral disc degeneration, lumbar region without mention of lumbar back pain or lower extremity pain: M51.369

## 2013-06-21 HISTORY — DX: Other intervertebral disc degeneration, lumbar region: M51.36

## 2013-06-21 LAB — CBC WITH DIFFERENTIAL/PLATELET
Basophils Absolute: 0.1 10*3/uL (ref 0.0–0.1)
Basophils Relative: 1 % (ref 0–1)
Eosinophils Absolute: 0.2 10*3/uL (ref 0.0–0.7)
Eosinophils Relative: 3 % (ref 0–5)
HCT: 43.3 % (ref 36.0–46.0)
Hemoglobin: 14.2 g/dL (ref 12.0–15.0)
MCH: 31.3 pg (ref 26.0–34.0)
MCHC: 32.8 g/dL (ref 30.0–36.0)
MCV: 95.6 fL (ref 78.0–100.0)
Monocytes Absolute: 0.5 10*3/uL (ref 0.1–1.0)
Monocytes Relative: 10 % (ref 3–12)
Neutro Abs: 3 10*3/uL (ref 1.7–7.7)
RDW: 12.9 % (ref 11.5–15.5)

## 2013-06-21 LAB — BASIC METABOLIC PANEL
BUN: 13 mg/dL (ref 6–23)
Calcium: 9.8 mg/dL (ref 8.4–10.5)
Chloride: 103 mEq/L (ref 96–112)
Creatinine, Ser: 0.67 mg/dL (ref 0.50–1.10)
GFR calc Af Amer: >90 mL/min (ref >90)
GFR calc non Af Amer: 86 mL/min — ABNORMAL LOW (ref >90)

## 2013-06-21 LAB — OCCULT BLOOD, POC DEVICE: Fecal Occult Bld: NEGATIVE

## 2013-06-21 NOTE — ED Notes (Signed)
nad noted prior to dc. Dc instructions reviewed and explained. Pt voiced understanding for f/u care.

## 2013-06-21 NOTE — ED Notes (Signed)
Pt reports black stools daily for  3 weeks, +nausea at times. Denies any dizziness. Has been having decreased energy. Was sent by her pmd for further eval.

## 2013-06-21 NOTE — ED Provider Notes (Signed)
CSN: [id number]     Arrival date & time 06/21/13  1347 History  This chart was scribed for Sharyon Cable, MD by Donato Schultz, ED Scribe. This patient was seen in room APA18/APA18 and the patient's care was started at 5:18 PM.     Chief Complaint  Patient presents with  . Melena    Patient is a 72 y.o. female presenting with diarrhea. The history is provided by the patient. No language interpreter was used.  Diarrhea Quality:  Black and tarry Severity:  Mild Duration:  3 weeks Timing:  Constant Progression:  Unchanged Relieved by:  Nothing Worsened by:  Nothing tried Ineffective treatments:  None tried Associated symptoms: abdominal pain   Associated symptoms: no fever and no vomiting    HPI Comments: Terry Allen is a 73 y.o. female who presents to the Emergency Department complaining of melena that has persisted for the past three weeks.  The patient confirms abdominal pain and weakness as associated symptoms. The patient denies emesis, chest pain, and SOB as associated symptoms.   The patient denies taking Ibuprofen recently.  She states that she has been taking Aspirin but denies being on any other anticoagulants.    The patient's PCP is Dr. Karie Kirks.    Past Medical History  Diagnosis Date  . Bulging lumbar disc   . Acid reflux   . Nerve damage    Past Surgical History  Procedure Laterality Date  . Bladded surgery     No family history on file. History  Substance Use Topics  . Smoking status: Never Smoker   . Smokeless tobacco: Not on file  . Alcohol Use: No   OB History   Grav Para Term Preterm Abortions TAB SAB Ect Mult Living                 Review of Systems  Constitutional: Negative for fever.  Respiratory: Negative for shortness of breath.   Cardiovascular: Negative for chest pain.  Gastrointestinal: Positive for abdominal pain and diarrhea. Negative for vomiting.       Melena   All other systems reviewed and are  negative.    Allergies  Review of patient's allergies indicates no known allergies.  Home Medications  No current outpatient prescriptions on file.  Triage Vitals: BP 127/81  Pulse 75  Temp(Src) 98.3 F (36.8 C) (Oral)  Resp 17  Ht 5\' 2"  (1.575 m)  Wt 132 lb (59.875 kg)  BMI 24.14 kg/m2  SpO2 100%  Physical Exam CONSTITUTIONAL: Well developed/well nourished HEAD: Normocephalic/atraumatic EYES: EOMI/PERRL, conjunctiva pink  ENMT: Mucous membranes moist NECK: supple no meningeal signs SPINE:entire spine nontender CV: S1/S2 noted, no murmurs/rubs/gallops noted LUNGS: Lungs are clear to auscultation bilaterally, no apparent distress ABDOMEN: soft, nontender, no rebound or guarding RECTAL: Stool color dark green, no blood, no melena, hemoccult negative GU:no cva tenderness NEURO: Pt is awake/alert, moves all extremitiesx4 EXTREMITIES: pulses normal, full ROM SKIN: warm, color normal PSYCH: no abnormalities of mood noted  ED Course  Procedures (including critical care time)  DIAGNOSTIC STUDIES: Oxygen Saturation is 100% on room air, normal by my interpretation.    COORDINATION OF CARE:    Labs Review Labs Reviewed  BASIC METABOLIC PANEL - Abnormal; Notable for the following:    GFR calc non Af Amer 86 (*)    All other components within normal limits  CBC WITH DIFFERENTIAL   Imaging Review No results found.  EKG Interpretation   None     no signs  of GI bleed here Advised to hold ASA, and to f/u with PCP and also with GI Her abdominal exam is unremarkable, I doubt acute abdominal process at this time   MDM  No diagnosis found. Nursing notes including past medical history and social history reviewed and considered in documentation Labs/vital reviewed and considered  I personally performed the services described in this documentation, which was scribed in my presence. The recorded information has been reviewed and is accurate.       Sharyon Cable, MD 06/21/13 1900

## 2014-02-28 ENCOUNTER — Other Ambulatory Visit (HOSPITAL_COMMUNITY): Payer: Self-pay | Admitting: Family Medicine

## 2014-02-28 DIAGNOSIS — Z1231 Encounter for screening mammogram for malignant neoplasm of breast: Secondary | ICD-10-CM

## 2014-03-03 ENCOUNTER — Inpatient Hospital Stay (HOSPITAL_COMMUNITY): Admission: RE | Admit: 2014-03-03 | Payer: Medicare Other | Source: Ambulatory Visit

## 2014-03-28 ENCOUNTER — Ambulatory Visit (HOSPITAL_COMMUNITY)
Admission: RE | Admit: 2014-03-28 | Discharge: 2014-03-28 | Disposition: A | Discharge status: Home / Self Care | Payer: Medicare Other | Source: Ambulatory Visit | Attending: Family Medicine | Admitting: Family Medicine

## 2014-03-28 DIAGNOSIS — Z1231 Encounter for screening mammogram for malignant neoplasm of breast: Secondary | ICD-10-CM

## 2014-11-16 ENCOUNTER — Encounter (INDEPENDENT_AMBULATORY_CARE_PROVIDER_SITE_OTHER): Payer: Self-pay | Admitting: *Deleted

## 2014-12-12 ENCOUNTER — Ambulatory Visit (INDEPENDENT_AMBULATORY_CARE_PROVIDER_SITE_OTHER): Payer: Commercial Managed Care - HMO | Admitting: Internal Medicine

## 2014-12-12 VITALS — BP 102/58 | HR 76 | Temp 98.1°F | Ht 62.0 in | Wt 136.1 lb

## 2014-12-12 DIAGNOSIS — E038 Other specified hypothyroidism: Secondary | ICD-10-CM

## 2014-12-12 DIAGNOSIS — K2270 Barrett's esophagus without dysplasia: Secondary | ICD-10-CM | POA: Diagnosis not present

## 2014-12-12 DIAGNOSIS — E7800 Pure hypercholesterolemia, unspecified: Secondary | ICD-10-CM

## 2014-12-12 DIAGNOSIS — K219 Gastro-esophageal reflux disease without esophagitis: Secondary | ICD-10-CM | POA: Diagnosis not present

## 2014-12-13 ENCOUNTER — Encounter (INDEPENDENT_AMBULATORY_CARE_PROVIDER_SITE_OTHER): Payer: Self-pay | Admitting: Internal Medicine

## 2014-12-13 DIAGNOSIS — K2270 Barrett's esophagus without dysplasia: Secondary | ICD-10-CM | POA: Insufficient documentation

## 2014-12-13 DIAGNOSIS — E7800 Pure hypercholesterolemia, unspecified: Secondary | ICD-10-CM | POA: Insufficient documentation

## 2014-12-13 DIAGNOSIS — K219 Gastro-esophageal reflux disease without esophagitis: Secondary | ICD-10-CM | POA: Insufficient documentation

## 2014-12-13 DIAGNOSIS — E039 Hypothyroidism, unspecified: Secondary | ICD-10-CM | POA: Insufficient documentation

## 2014-12-13 NOTE — Progress Notes (Signed)
   Subjective:    Patient ID: Terry Allen, female    DOB: 09/06/1941, 74 y.o.   MRN: [id number]  HPI Referred to our office by Dr. Karie Kirks for GERD/Barrett's esophagus. She tells me she has pain from her upper esophagus to her epigastric rgion.  She feels tired. She is taking Protonix BID which she has been on for a while. She says the Protonix controls her acid reflux.   Her appetite is okay. No weight loss. She avoids spicy foods. Occasionally drinks a Pepsi.  Usually has a BM x 2 a day. No change in her stools.  No melena or BRRB.  She under went an EGD by Dr. Ferdinand Lango at [facility] she thinks in ? 2011 which revealed Barrett's esophagus.  I will try to locate those records.     Review of Systems Divorced. One child in good health with DM.     Past Medical History  Diagnosis Date  . Bulging lumbar disc   . Acid reflux   . Nerve damage   . Hypothyroidism   . High cholesterol     Past Surgical History  Procedure Laterality Date  . Bladded surgery      No Known Allergies  No current outpatient prescriptions on file prior to visit.   No current facility-administered medications on file prior to visit.   Current outpatient prescriptions:  .  amitriptyline (ELAVIL) 50 MG tablet, Take 50 mg by mouth at bedtime., Disp: , Rfl:  .  aspirin 81 MG tablet, Take 81 mg by mouth daily., Disp: , Rfl:  .  calcium carbonate (OS-CAL) 600 MG TABS tablet, Take 600 mg by mouth 2 (two) times daily with a meal., Disp: , Rfl:  .  gabapentin (NEURONTIN) 300 MG capsule, Take 300 mg by mouth 3 (three) times daily., Disp: , Rfl:  .  HYDROcodone-acetaminophen (NORCO) 10-325 MG per tablet, Take 1 tablet by mouth every 6 (six) hours as needed., Disp: , Rfl:  .  levothyroxine (SYNTHROID, LEVOTHROID) 100 MCG tablet, Take 100 mcg by mouth. X 4 a weeks. Skip W-S-Sun, Disp: , Rfl:  .  Multiple Vitamin (MULTIVITAMIN) tablet, Take 1 tablet by mouth daily., Disp: , Rfl:  .  pantoprazole  (PROTONIX) 40 MG tablet, Take 40 mg by mouth 2 (two) times daily., Disp: , Rfl:  .  simvastatin (ZOCOR) 40 MG tablet, Take 40 mg by mouth daily., Disp: , Rfl:        Objective:   Physical Exam Blood pressure 102/58, pulse 76, temperature 98.1 F (36.7 C), height 5\' 2"  (1.575 m), weight 136 lb 1.6 oz (61.735 kg).  Alert and oriented. Skin warm and dry. Oral mucosa is moist.   . Sclera anicteric, conjunctivae is pink. Thyroid not enlarged. No cervical lymphadenopathy. Lungs clear. Heart regular rate and rhythm.  Abdomen is soft. Bowel sounds are positive. No hepatomegaly. No abdominal masses felt. No tenderness.  No edema to lower extremities.        Assessment & Plan:  GERD. Presently taking Protonix BID. Barrett'sL needs surveillance.  I have asked Butch Penny to locate last EGD report.

## 2014-12-13 NOTE — Patient Instructions (Signed)
EGD. The risks and benefits such as perforation, bleeding, and infection were reviewed with the patient and is agreeable. 

## 2014-12-14 ENCOUNTER — Other Ambulatory Visit (INDEPENDENT_AMBULATORY_CARE_PROVIDER_SITE_OTHER): Payer: Self-pay | Admitting: *Deleted

## 2014-12-14 ENCOUNTER — Encounter (INDEPENDENT_AMBULATORY_CARE_PROVIDER_SITE_OTHER): Payer: Self-pay | Admitting: *Deleted

## 2014-12-14 DIAGNOSIS — K219 Gastro-esophageal reflux disease without esophagitis: Secondary | ICD-10-CM

## 2014-12-21 ENCOUNTER — Encounter (HOSPITAL_COMMUNITY): Payer: Self-pay | Admitting: *Deleted

## 2014-12-21 ENCOUNTER — Ambulatory Visit (HOSPITAL_COMMUNITY)
Admission: RE | Admit: 2014-12-21 | Discharge: 2014-12-21 | Disposition: A | Discharge status: Home / Self Care | Payer: Commercial Managed Care - HMO | Source: Ambulatory Visit | Attending: Internal Medicine | Admitting: Internal Medicine

## 2014-12-21 ENCOUNTER — Encounter (HOSPITAL_COMMUNITY)
Admission: RE | Disposition: A | Discharge status: Home / Self Care | Payer: Self-pay | Source: Ambulatory Visit | Attending: Internal Medicine

## 2014-12-21 DIAGNOSIS — K2270 Barrett's esophagus without dysplasia: Secondary | ICD-10-CM | POA: Diagnosis not present

## 2014-12-21 DIAGNOSIS — Z7982 Long term (current) use of aspirin: Secondary | ICD-10-CM | POA: Insufficient documentation

## 2014-12-21 DIAGNOSIS — M5126 Other intervertebral disc displacement, lumbar region: Secondary | ICD-10-CM | POA: Insufficient documentation

## 2014-12-21 DIAGNOSIS — K317 Polyp of stomach and duodenum: Secondary | ICD-10-CM | POA: Insufficient documentation

## 2014-12-21 DIAGNOSIS — K219 Gastro-esophageal reflux disease without esophagitis: Secondary | ICD-10-CM

## 2014-12-21 HISTORY — DX: Pelvic and perineal pain unspecified side: R10.20

## 2014-12-21 HISTORY — PX: ESOPHAGOGASTRODUODENOSCOPY: SHX5428

## 2014-12-21 HISTORY — DX: Pelvic and perineal pain: R10.2

## 2014-12-21 SURGERY — EGD (ESOPHAGOGASTRODUODENOSCOPY)
Anesthesia: Moderate Sedation

## 2014-12-21 MED ORDER — STERILE WATER FOR IRRIGATION IR SOLN
Status: DC | PRN
  Administered 2014-12-21: 14:00:00

## 2014-12-21 MED ORDER — SODIUM CHLORIDE 0.9 % IV SOLN
INTRAVENOUS | Status: DC
  Administered 2014-12-21: 13:00:00 via INTRAVENOUS

## 2014-12-21 MED ORDER — BUTAMBEN-TETRACAINE-BENZOCAINE 2-2-14 % EX AERO
INHALATION_SPRAY | CUTANEOUS | Status: DC | PRN
  Administered 2014-12-21: 2 via TOPICAL

## 2014-12-21 MED ORDER — MIDAZOLAM HCL 5 MG/5ML IJ SOLN
INTRAMUSCULAR | Status: DC | PRN
  Administered 2014-12-21: 1 mg via INTRAVENOUS
  Administered 2014-12-21 (×2): 2 mg via INTRAVENOUS

## 2014-12-21 MED ORDER — MEPERIDINE HCL 50 MG/ML IJ SOLN
INTRAMUSCULAR | Status: DC | PRN
  Administered 2014-12-21: 25 mg via INTRAVENOUS
  Administered 2014-12-21: 15 mg via INTRAVENOUS

## 2014-12-21 MED ORDER — MEPERIDINE HCL 50 MG/ML IJ SOLN
INTRAMUSCULAR | Status: DC
Start: 2014-12-21 — End: 2014-12-21
  Filled 2014-12-21: qty 1

## 2014-12-21 MED ORDER — MIDAZOLAM HCL 5 MG/5ML IJ SOLN
INTRAMUSCULAR | Status: AC
  Filled 2014-12-21: qty 10

## 2014-12-21 NOTE — H&P (Signed)
Terry Allen is an 74 y.o. female.   Chief Complaint: Patient is here for EGD. HPI: Patient is 74 year old Caucasian female was chronic GERD complicated by Barrett's esophagus and is here for surveillance EGD. Her last exam was in [facility] about 5 years ago. She denies heartburn dysphagia nausea vomiting epigastric pain or melena. She does not smoke cigarettes or drink alcohol.  Past Medical History  Diagnosis Date  . Bulging lumbar disc   . Acid reflux   . Nerve damage   . Hypothyroidism   . High cholesterol   . Pelvic pain in female     due to bladder sling    Past Surgical History  Procedure Laterality Date  . Bladder suspension      History reviewed. No pertinent family history. Social History:  reports that she has never smoked. She does not have any smokeless tobacco history on file. She reports that she does not drink alcohol or use illicit drugs.  Allergies: No Known Allergies  Medications Prior to Admission  Medication Sig Dispense Refill  . amitriptyline (ELAVIL) 50 MG tablet Take 50 mg by mouth at bedtime.    Marland Kitchen aspirin 81 MG tablet Take 81 mg by mouth daily.    . calcium carbonate (OS-CAL) 600 MG TABS tablet Take 600 mg by mouth 2 (two) times daily with a meal.    . gabapentin (NEURONTIN) 300 MG capsule Take 300 mg by mouth 3 (three) times daily.    Marland Kitchen HYDROcodone-acetaminophen (NORCO/VICODIN) 5-325 MG per tablet Take 1 tablet by mouth 3 (three) times daily as needed.    Marland Kitchen levothyroxine (SYNTHROID, LEVOTHROID) 100 MCG tablet Take 100 mcg by mouth daily before breakfast. X 4 a weeks. Skip W-S-Sun    . Multiple Vitamin (MULTIVITAMIN) tablet Take 1 tablet by mouth daily.    . pantoprazole (PROTONIX) 40 MG tablet Take 40 mg by mouth 2 (two) times daily.    . simvastatin (ZOCOR) 40 MG tablet Take 40 mg by mouth daily.      No results found for this or any previous visit (from the past 48 hour(s)). No results found.  ROS  Blood pressure  132/85, pulse 97, temperature 98.1 F (36.7 C), temperature source Oral, resp. rate 18, height 5\' 2"  (1.575 m), weight 134 lb (60.782 kg), SpO2 95 %. Physical Exam  Constitutional:  Well-developed thin Caucasian female in NAD.  HENT:  Mouth/Throat: Oropharynx is clear and moist.  Eyes: Conjunctivae are normal. No scleral icterus.  Neck: No thyromegaly present.  Cardiovascular: Normal rate, regular rhythm and normal heart sounds.   No murmur heard. Respiratory: Effort normal and breath sounds normal.  GI: Soft. She exhibits no distension and no mass. There is no tenderness.  Musculoskeletal: She exhibits no edema.  Neurological: She is alert.  Skin: Skin is warm and dry.     Assessment/Plan Chronic GERD complicated by Barrett's esophagus. Surveillance EGD.  REHMAN,NAJEEB U 12/21/2014, 1:56 PM

## 2014-12-21 NOTE — Discharge Instructions (Signed)
Resume usual medications and diet. No driving for 24 hours. Physician will call with biopsy results.     Esophagogastroduodenoscopy Care After Refer to this sheet in the next few weeks. These instructions provide you with information on caring for yourself after your procedure. Your caregiver may also give you more specific instructions. Your treatment has been planned according to current medical practices, but problems sometimes occur. Call your caregiver if you have any problems or questions after your procedure.  HOME CARE INSTRUCTIONS  Do not eat or drink anything until the numbing medicine (local anesthetic) has worn off and your gag reflex has returned. You will know that the local anesthetic has worn off when you can swallow comfortably.  Do not drive for 12 hours after the procedure or as directed by your caregiver.  Only take medicines as directed by your caregiver. SEEK MEDICAL CARE IF:   You cannot stop coughing.  You are not urinating at all or less than usual. SEEK IMMEDIATE MEDICAL CARE IF:  You have difficulty swallowing.  You cannot eat or drink.  You have worsening throat or chest pain.  You have dizziness, lightheadedness, or you faint.  You have nausea or vomiting.  You have chills.  You have a fever.  You have severe abdominal pain.  You have black, tarry, or bloody stools. Document Released: 08/12/2012 Document Reviewed: 08/12/2012 [facility] Patient Information 2015 [place]. This information is not intended to replace advice given to you by your health care provider. Make sure you discuss any questions you have with your health care provider.

## 2014-12-21 NOTE — Op Note (Signed)
EGD PROCEDURE REPORT  PATIENT:  Terry Allen  MR#:  [id number] Birthdate:  Jan 15, 1941, 74 y.o., female Endoscopist:  Dr. Rogene Houston, MD Referred By:  Dr. Robert Bellow, MD  Procedure Date: 12/21/2014  Procedure:   EGD  Indications:  Patient is 74 year old Caucasian female was chronic GERD complicated by Barrett's esophagus who is here for surveillance EGD. Last exam was in [facility] about 5 years ago. Patient is an pantoprazole 40 mg twice a day with satisfactory control of heartburn.            Informed Consent:  The risks, benefits, alternatives & imponderables which include, but are not limited to, bleeding, infection, perforation, drug reaction and potential missed lesion have been reviewed.  The potential for biopsy, lesion removal, esophageal dilation, etc. have also been discussed.  Questions have been answered.  All parties agreeable.  Please see history & physical in medical record for more information.  Medications:  Demerol 40 mg IV Versed 5 mg IV Cetacaine spray topically for oropharyngeal anesthesia  Description of procedure:  The endoscope was introduced through the mouth and advanced to the second portion of the duodenum without difficulty or limitations. The mucosal surfaces were surveyed very carefully during advancement of the scope and upon withdrawal.  Findings:  Esophagus:  Mucosa of the proximal and middle segment was normal. Distally there was 6-7 mm tall patch of Barrett's involving about 60% the circumference. No ring or stricture noted. GEJ:  39 cmcm Stomach:  Stomach was empty and distended very well with insufflation. Folds in the proximal stomach were normal. Examination of mucosa at gastric body revealed two small hyperplastic appearing polyps. These were left alone. Antral mucosa was normal. Pyloric channel was patent. Angularis fundus and cardia with examined by retroflex of the scope and were normal. Duodenum:  Normal bulbar  and post bulbar mucosa.  Therapeutic/Diagnostic Maneuvers Performed:  Multiple biopsies taken from short segment Barrett's for routine histology.  Complications:  None  Impression: Short segment Barrett's esophagus involving 60% of the circumference and only 6-7 mm tall. Multiple biopsies taken for routine histology. No evidence of erosive esophagitis or peptic ulcer disease. Two small hyperplastic appearing polyps in gastric body. These are left alone.  Recommendations:  Standard instructions given. Continue anti-reflux measures and pantoprazole as before. I will be contacting patient with biopsy results and further recommendations.  Sahara Fujimoto U  12/21/2014  2:22 PM  CC: Dr. Robert Bellow, MD & Dr. Rayne Du ref. provider found

## 2014-12-26 ENCOUNTER — Encounter (HOSPITAL_COMMUNITY): Payer: Self-pay | Admitting: Internal Medicine

## 2014-12-26 ENCOUNTER — Encounter (INDEPENDENT_AMBULATORY_CARE_PROVIDER_SITE_OTHER): Payer: Self-pay | Admitting: *Deleted

## 2015-01-30 ENCOUNTER — Encounter (INDEPENDENT_AMBULATORY_CARE_PROVIDER_SITE_OTHER): Payer: Self-pay

## 2015-03-21 ENCOUNTER — Encounter (INDEPENDENT_AMBULATORY_CARE_PROVIDER_SITE_OTHER): Payer: Self-pay

## 2015-06-01 ENCOUNTER — Emergency Department (HOSPITAL_COMMUNITY)
Admission: EM | Admit: 2015-06-01 | Discharge: 2015-06-01 | Disposition: A | Discharge status: Home / Self Care | Payer: Commercial Managed Care - HMO | Attending: Emergency Medicine | Admitting: Emergency Medicine

## 2015-06-01 ENCOUNTER — Ambulatory Visit (INDEPENDENT_AMBULATORY_CARE_PROVIDER_SITE_OTHER): Payer: Commercial Managed Care - HMO | Admitting: Internal Medicine

## 2015-06-01 ENCOUNTER — Emergency Department (HOSPITAL_COMMUNITY): Payer: Commercial Managed Care - HMO

## 2015-06-01 ENCOUNTER — Encounter (HOSPITAL_COMMUNITY): Payer: Self-pay | Admitting: *Deleted

## 2015-06-01 DIAGNOSIS — R110 Nausea: Secondary | ICD-10-CM | POA: Diagnosis not present

## 2015-06-01 DIAGNOSIS — Z79899 Other long term (current) drug therapy: Secondary | ICD-10-CM | POA: Diagnosis not present

## 2015-06-01 DIAGNOSIS — E039 Hypothyroidism, unspecified: Secondary | ICD-10-CM | POA: Diagnosis not present

## 2015-06-01 DIAGNOSIS — F419 Anxiety disorder, unspecified: Secondary | ICD-10-CM | POA: Diagnosis not present

## 2015-06-01 DIAGNOSIS — E780 Pure hypercholesterolemia: Secondary | ICD-10-CM | POA: Diagnosis not present

## 2015-06-01 DIAGNOSIS — K219 Gastro-esophageal reflux disease without esophagitis: Secondary | ICD-10-CM | POA: Insufficient documentation

## 2015-06-01 DIAGNOSIS — Z7982 Long term (current) use of aspirin: Secondary | ICD-10-CM | POA: Diagnosis not present

## 2015-06-01 DIAGNOSIS — Z8739 Personal history of other diseases of the musculoskeletal system and connective tissue: Secondary | ICD-10-CM | POA: Insufficient documentation

## 2015-06-01 DIAGNOSIS — R1010 Upper abdominal pain, unspecified: Secondary | ICD-10-CM

## 2015-06-01 DIAGNOSIS — K5900 Constipation, unspecified: Secondary | ICD-10-CM | POA: Insufficient documentation

## 2015-06-01 DIAGNOSIS — R1013 Epigastric pain: Secondary | ICD-10-CM | POA: Insufficient documentation

## 2015-06-01 DIAGNOSIS — R109 Unspecified abdominal pain: Secondary | ICD-10-CM | POA: Diagnosis not present

## 2015-06-01 LAB — BASIC METABOLIC PANEL
ANION GAP: 7 (ref 5–15)
BUN: 14 mg/dL (ref 6–20)
CALCIUM: 9.1 mg/dL (ref 8.9–10.3)
CO2: 28 mmol/L (ref 22–32)
Chloride: 106 mmol/L (ref 101–111)
Creatinine, Ser: 0.67 mg/dL (ref 0.44–1.00)
GFR calc Af Amer: >60 mL/min (ref >60)
GLUCOSE: 125 mg/dL — AB (ref 65–99)
Potassium: 3.5 mmol/L (ref 3.5–5.1)
Sodium: 141 mmol/L (ref 135–145)

## 2015-06-01 LAB — CBC
HCT: 41.3 % (ref 36.0–46.0)
Hemoglobin: 13.9 g/dL (ref 12.0–15.0)
MCH: 32.3 pg (ref 26.0–34.0)
MCHC: 33.7 g/dL (ref 30.0–36.0)
MCV: 95.8 fL (ref 78.0–100.0)
PLATELETS: 191 10*3/uL (ref 150–400)
RBC: 4.31 MIL/uL (ref 3.87–5.11)
RDW: 12.2 % (ref 11.5–15.5)
WBC: 5.1 10*3/uL (ref 4.0–10.5)

## 2015-06-01 MED ORDER — ONDANSETRON HCL 4 MG PO TABS
4.0000 mg | ORAL_TABLET | Freq: Once | ORAL | Status: AC
Start: 2015-06-01 — End: 2015-06-01
  Administered 2015-06-01: 4 mg via ORAL
  Filled 2015-06-01: qty 1

## 2015-06-01 MED ORDER — POLYETHYLENE GLYCOL 3350 17 G PO PACK: 17.0000 g | PACK | Freq: Every day | ORAL | Status: DC

## 2015-06-01 MED ORDER — ONDANSETRON HCL 4 MG PO TABS: 4.0000 mg | ORAL_TABLET | Freq: Three times a day (TID) | ORAL | Status: DC | PRN

## 2015-06-01 NOTE — Discharge Instructions (Signed)
Abdominal pain could be coming from the constipation you are having. Please see below relief mearures. We discussed taking Miralax once daily and if not working increase to twice daily Refill on ondansetron given for nausea symptoms You need to follow-up with your doctor for further management of symtpoms   For your constipation, we'll need to try several ways to treat this:  Try first:  Drink plenty of fluid, preferably water, throughout the day  Eat foods high in fiber such as fruits, vegetables, and grains  Exercise, such as walking, is a good way to keep your bowels regular  Drink warm fluids, especially warm prune juice, or decaf coffee  Eat a 1/2 cup of real oatmeal (not instant), 1/2 cup applesauce, and 1/2-1 cup warm prune juice every day  If not helping then try:  If needed, you may take Colace (docusate sodium) stool softener once or twice a day to help keep the stool soft.   If you still are having problems with constipation, you may take Miralax once daily as needed to help keep your bowels regular.  Stop taking it if you have diarrhea.  You can increase this to 3 times a day to help you go as well.  If you still haven't gone to the restroom after three days, it's time to try either a suppository or an enema.  This should make you go.  Continue to take the stool softener and Miralax, even if you use the suppository.  Your goal is to have a soft bowel movement once every other day at the least. Once you start going to the bathroom, cut back until you achieve that goal.     Abdominal Pain Many things can cause belly (abdominal) pain. Most times, the belly pain is not dangerous. Many cases of belly pain can be watched and treated at home. HOME CARE  10. Do not take medicines that help you go poop (laxatives) unless told to by your doctor. 11. Only take medicine as told by your doctor. 12. Eat or drink as told by your doctor. Your doctor will tell you if you should be  on a special diet. GET HELP IF:  You do not know what is causing your belly pain.  You have belly pain while you are sick to your stomach (nauseous) or have runny poop (diarrhea).  You have pain while you pee or poop.  Your belly pain wakes you up at night.  You have belly pain that gets worse or better when you eat.  You have belly pain that gets worse when you eat fatty foods.  You have a fever. GET HELP RIGHT AWAY IF:   The pain does not go away within 2 hours.  You keep throwing up (vomiting).  The pain changes and is only in the right or left part of the belly.  You have bloody or tarry looking poop. MAKE SURE YOU:   Understand these instructions.  Will watch your condition.  Will get help right away if you are not doing well or get worse. Document Released: 02/12/2008 Document Revised: 08/31/2013 Document Reviewed: 05/05/2013 [facility] Patient Information 2015 [place], [place]. This information is not intended to replace advice given to you by your health care provider. Make sure you discuss any questions you have with your health care provider.

## 2015-06-01 NOTE — ED Provider Notes (Signed)
CSN: [id number]     Arrival date & time 06/01/15  1116 History   First MD Initiated Contact with Patient 06/01/15 1132     No chief complaint on file.   HPI Comments: Patient stating that she has been having abdominal pain for months. She has been seen by her PCP office for evaluation. She was told she has severe reflux and that stress/anxiety may be making symptoms worse. Denies any reflux symptoms currently. She states that she has nausea without vomiting. Nausea is making her sick and preventing her from eating. Her doctor gave her Zofran prior and patient states that helped her nausea. Used to follow-up with GI but hasn't gone back in a while. Denies any blood in stool. Does state she is constipated.   Patient is a 74 y.o. female presenting with abdominal pain.  Abdominal Pain Pain location:  Epigastric Pain radiates to:  Does not radiate Progression:  Worsening Associated symptoms: constipation and nausea   Associated symptoms: no chest pain, no fever, no shortness of breath and no vomiting     Past Medical History  Diagnosis Date  . Bulging lumbar disc   . Acid reflux   . Nerve damage   . Hypothyroidism   . High cholesterol   . Pelvic pain in female     due to bladder sling   Past Surgical History  Procedure Laterality Date  . Bladder suspension    . Esophagogastroduodenoscopy N/A 12/21/2014    Procedure: ESOPHAGOGASTRODUODENOSCOPY (EGD);  Surgeon: Rogene Houston, MD;  Location: [facility];  Service: Endoscopy;  Laterality: N/A;  210   No family history on file. Social History  Substance Use Topics  . Smoking status: Never Smoker   . Smokeless tobacco: None  . Alcohol Use: No   OB History    No data available     Review of Systems  Constitutional: Positive for appetite change and unexpected weight change. Negative for fever.  Respiratory: Negative for shortness of breath.   Cardiovascular: Negative for chest pain.  Gastrointestinal: Positive for nausea,  abdominal pain and constipation. Negative for vomiting.  Also per HPI  Allergies  Review of patient's allergies indicates no known allergies.  Home Medications   Prior to Admission medications   Medication Sig Start Date End Date Taking? Authorizing Provider  acetaminophen (TYLENOL) 500 MG tablet Take 1,000 mg by mouth every 6 (six) hours as needed.   Yes Historical Provider, MD  aspirin 81 MG tablet Take 81 mg by mouth daily.   Yes Historical Provider, MD  calcium carbonate (OS-CAL) 600 MG TABS tablet Take 600 mg by mouth 2 (two) times daily with a meal.   Yes Historical Provider, MD  gabapentin (NEURONTIN) 300 MG capsule Take 300 mg by mouth 3 (three) times daily.   Yes Historical Provider, MD  HYDROcodone-acetaminophen (NORCO/VICODIN) 5-325 MG per tablet Take 1 tablet by mouth 3 (three) times daily as needed. 12/07/14  Yes Historical Provider, MD  levothyroxine (SYNTHROID, LEVOTHROID) 100 MCG tablet Take 100 mcg by mouth daily before breakfast. X 4 a weeks. Skip W-S-Sun   Yes Historical Provider, MD  Multiple Vitamin (MULTIVITAMIN) tablet Take 1 tablet by mouth daily.   Yes Historical Provider, MD  pantoprazole (PROTONIX) 40 MG tablet Take 40 mg by mouth 2 (two) times daily.   Yes Historical Provider, MD  simvastatin (ZOCOR) 40 MG tablet Take 40 mg by mouth daily.   Yes Historical Provider, MD  ondansetron (ZOFRAN) 4 MG tablet Take 1 tablet (  4 mg total) by mouth every 8 (eight) hours as needed for nausea or vomiting. 06/01/15   Katheren Shams, DO  polyethylene glycol (MIRALAX / GLYCOLAX) packet Take 17 g by mouth daily. 06/01/15   Katheren Shams, DO   BP 141/83 mmHg  Pulse 73  Temp(Src) 98.1 F (36.7 C) (Oral)  Resp 18  Ht 5\' 2"  (1.575 m)  Wt 130 lb (58.968 kg)  BMI 23.77 kg/m2  SpO2 100% Physical Exam  Constitutional: She is oriented to person, place, and time. She appears well-developed and well-nourished. No distress.  HENT:  Head: Normocephalic and atraumatic.  Mouth/Throat:  Oropharynx is clear and moist.  Eyes: EOM are normal.  Neck: Normal range of motion. Neck supple.  Cardiovascular: Normal rate, regular rhythm and intact distal pulses.   Pulmonary/Chest: Effort normal and breath sounds normal.  Abdominal: Soft. Normal appearance and bowel sounds are normal. She exhibits no distension. There is tenderness in the epigastric area.  Musculoskeletal: Normal range of motion. She exhibits no edema.  Neurological: She is alert and oriented to person, place, and time.  Skin: Skin is warm and dry.  Psychiatric: Her mood appears anxious.    ED Course  Procedures (including critical care time) Labs Review Labs Reviewed  BASIC METABOLIC PANEL - Abnormal; Notable for the following:    Glucose, Bld 125 (*)    All other components within normal limits  CBC    Imaging Review Dg Abd 1 View  06/01/2015   CLINICAL DATA:  Abdominal pain.  EXAM: ABDOMEN - 1 VIEW  COMPARISON:  None.  FINDINGS: Moderate amount of stool throughout the colon. There is no bowel dilatation to suggest obstruction. There is no evidence of pneumoperitoneum, portal venous gas or pneumatosis. There are no pathologic calcifications along the expected course of the ureters.The osseous structures are unremarkable.  IMPRESSION: Negative.   Electronically Signed   By: Kathreen Devoid   On: 06/01/2015 12:57   I have personally reviewed and evaluated these images and lab results as part of my medical decision-making.   EKG Interpretation None      MDM   Final diagnoses:  Pain of upper abdomen  Nausea   Patient stating she has pain located in her upper abdomen with associated nausea. She denies any vomiting. Abdominal pain seems chronic in nature as she has been worked up for this several times by GI and PCP. Has history of severe reflux and esophagitis.   Basic labs in ED were normal. DG abdomen with signs of constipation. Most likely due to narcotic pain medication use without stool softener.  Constipation most likely adding to abdominal discomfort. Discussed the use of Miralax with patient.  Will discharge her home in stable condition. Rx for Zofran and Miralax given. Discussed my assessment and plan with patient. She was agreeable. She was encouraged to follow-up with her PCP and gastroenterologist.    Luiz Blare, DO 06/01/2015, 3:28 PM PGY-2, [place], DO 06/01/15 1536  Leonard Schwartz, MD 06/02/15 0730

## 2015-06-01 NOTE — ED Notes (Signed)
Pt comes in with upper abdomen pain starting beginning of the year.  Pt only has pain and nausea. Denies vomiting or diarrhea. Pt tried to make appt with Dr. Laural Golden but needed a primary referral. NAD noted. Pt is anxious upon assessment.

## 2015-06-06 ENCOUNTER — Other Ambulatory Visit (HOSPITAL_COMMUNITY): Payer: Self-pay | Admitting: Family Medicine

## 2015-06-06 DIAGNOSIS — R1013 Epigastric pain: Secondary | ICD-10-CM

## 2015-06-07 ENCOUNTER — Ambulatory Visit (HOSPITAL_COMMUNITY)
Admission: RE | Admit: 2015-06-07 | Discharge: 2015-06-07 | Disposition: A | Discharge status: Home / Self Care | Payer: Commercial Managed Care - HMO | Source: Ambulatory Visit | Attending: Family Medicine | Admitting: Family Medicine

## 2015-06-07 ENCOUNTER — Other Ambulatory Visit (HOSPITAL_COMMUNITY): Payer: Self-pay | Admitting: Family Medicine

## 2015-06-07 DIAGNOSIS — R1013 Epigastric pain: Secondary | ICD-10-CM | POA: Insufficient documentation

## 2015-06-07 DIAGNOSIS — K760 Fatty (change of) liver, not elsewhere classified: Secondary | ICD-10-CM | POA: Diagnosis not present

## 2015-06-07 DIAGNOSIS — Z1231 Encounter for screening mammogram for malignant neoplasm of breast: Secondary | ICD-10-CM

## 2015-06-07 DIAGNOSIS — K5730 Diverticulosis of large intestine without perforation or abscess without bleeding: Secondary | ICD-10-CM | POA: Insufficient documentation

## 2015-06-07 MED ORDER — IOHEXOL 300 MG/ML  SOLN
100.0000 mL | Freq: Once | INTRAMUSCULAR | Status: AC | PRN
  Administered 2015-06-07: 100 mL via INTRAVENOUS

## 2015-06-08 ENCOUNTER — Ambulatory Visit (INDEPENDENT_AMBULATORY_CARE_PROVIDER_SITE_OTHER): Payer: Commercial Managed Care - HMO | Admitting: Internal Medicine

## 2015-06-08 ENCOUNTER — Encounter (INDEPENDENT_AMBULATORY_CARE_PROVIDER_SITE_OTHER): Payer: Self-pay | Admitting: Internal Medicine

## 2015-06-08 VITALS — BP 130/84 | HR 70 | Temp 97.7°F | Resp 18 | Ht 62.0 in | Wt 128.8 lb

## 2015-06-08 DIAGNOSIS — R1013 Epigastric pain: Secondary | ICD-10-CM

## 2015-06-08 DIAGNOSIS — R110 Nausea: Secondary | ICD-10-CM

## 2015-06-08 LAB — EOSINOPHIL COUNT: Eosinophils Absolute: 0.2 10*3/uL (ref 0.0–0.7)

## 2015-06-08 MED ORDER — DICYCLOMINE HCL 10 MG PO CAPS
10.0000 mg | ORAL_CAPSULE | Freq: Two times a day (BID) | ORAL | Status: DC | PRN
Start: 2015-06-08 — End: 2015-06-16

## 2015-06-08 NOTE — Patient Instructions (Signed)
Physician will call with results of blood work and ultrasound and completed. Dicyclomine by mouth 15-30 minutes before breakfast and lunch daily for few days and then on as-needed basis.

## 2015-06-08 NOTE — Progress Notes (Signed)
Presenting complaint;  Epigastric pain nausea and weight loss.  History of present illness:  Patient is 74 year old Caucasian female who has chronic GERD complicated by short segment Barrett's esophagus and was last seen on 12/12/2014 and following that visit she underwent EGD with biopsy on 12/21/2014. She was felt to have short segment Barrett's but biopsy showed changes of reflux esophagitis and no intestinal metaplasia.  Now she is returning for evaluation of epigastric pain nausea and weight loss. Dr. Karie Kirks called office earlier this week and requested for to be seen. Patient presents with three-month history of epigastric pain which she describes as dull aching pains and she has periods when pain is intense and relieved with pain medication. Pain is mainly in epigastric region but at times it radiates into the lower chest. She states she has been evaluated by her cardiologist and felt not to have angina.  She has experienced nausea without vomiting. Nausea generally is worse in the afternoon and after meals and at times she wakes up at 2 AM. Ondansetron helps alleviate this symptom. She has not had any vomiting. She says heartburns well controlled with PPI. She denies dysphagia melena or rectal bleeding. She has had problems with constipation for which she is taking polyethylene glycol. She does not have good appetite. She says she has lost 8-10 pounds since her symptoms began. She is wondering if she has parasites but she denies diarrhea. She was seen in emergency room at [facility] on 06/01/2015. Plain abdominal film was unremarkable and so was CBC and metabolic 7. Serum calcium was 9.1. She has history of IBS and her symptoms usually included diarrhea alternating with constipation which is not the case now. She does not take OTC NSAIDs. There is no history of peptic ulcer disease. She was given lorazepam by Dr. Karie Kirks but she stopped this medication because of side effects. Patient states her  last colonoscopy was at [place] [facility] in [facility] about 5 years ago. She had flexible sigmoidoscopy in October 2013 for rectal pain and no abnormality noted other than internal hemorrhoids. She had abdominopelvic CT with contrast 2 days ago by Dr. Karie Kirks revealing colonic diverticulosis and thickened gastric wall but stomach was not distended.    Current Medications: Outpatient Encounter Prescriptions as of 06/08/2015  Medication Sig  . acetaminophen (TYLENOL) 500 MG tablet Take 1,000 mg by mouth every 6 (six) hours as needed.  Marland Kitchen aspirin 81 MG tablet Take 81 mg by mouth daily.  . calcium carbonate (OS-CAL) 600 MG TABS tablet Take 600 mg by mouth 2 (two) times daily with a meal.  . gabapentin (NEURONTIN) 300 MG capsule Take 300 mg by mouth 3 (three) times daily.  Marland Kitchen HYDROcodone-acetaminophen (NORCO/VICODIN) 5-325 MG per tablet Take 1 tablet by mouth 3 (three) times daily as needed.  Marland Kitchen levothyroxine (SYNTHROID, LEVOTHROID) 100 MCG tablet Take 100 mcg by mouth daily before breakfast. X 4 a weeks. Skip W-S-Sun  . Multiple Vitamin (MULTIVITAMIN) tablet Take 1 tablet by mouth daily.  . ondansetron (ZOFRAN) 4 MG tablet Take 1 tablet (4 mg total) by mouth every 8 (eight) hours as needed for nausea or vomiting.  . pantoprazole (PROTONIX) 40 MG tablet Take 40 mg by mouth 2 (two) times daily.  . polyethylene glycol (MIRALAX / GLYCOLAX) packet Take 17 g by mouth daily.  . simvastatin (ZOCOR) 40 MG tablet Take 40 mg by mouth daily.   No facility-administered encounter medications on file as of 06/08/2015.   Past medical history: She  had appendectomy 1960. Chronic GERD complicated by short segment Barrett's esophagus. Biopsy from distal esophagus was negative for intestinal metaplasia in April 2016. She has chronic pelvic pain felt to be due to nerve damage. History of IBS. Hypothyroidism of more than 20 years duration. Hyperlipidemia. She has been on medication for about 8  years. She had pelvic sling placed 7 years ago and was partially removed in August 2012.  Allergies: No Known Allergies.  Family history: Vital was diagnosed with lung carcinoma at age 18 and died within one month for diagnosis. Mother was treated for breast carcinoma when she was 43 but died of CVA at age 62. One brother died of renal cell carcinoma at age 42 within 5 months of diagnosis. She has one brother age 63 in good health.  Social history: She is divorced. She is retired Optometrist. She has one son who is not in good health. He is diabetic. She does not smoke cigarettes or drink alcohol.   Objective: Blood pressure 130/84, pulse 70, temperature 97.7 F (36.5 C), temperature source Oral, resp. rate 18, height 5\' 2"  (1.575 m), weight 128 lb 12.8 oz (58.423 kg). Patient is alert and in no acute distress. Conjunctiva is pink. Sclera is nonicteric Oropharyngeal mucosa is normal. No neck masses or thyromegaly noted. Cardiac exam with regular rhythm normal S1 and S2. No murmur or gallop noted. Lungs are clear to auscultation. Abdomen is symmetrical. Bowel sounds are normal. No bruits noted. On palpation abdomen is soft with mild midepigastric tenderness. No organomegaly or masses noted. Rectal examination reveals formed stool in the vault and it is guaiac negative. No LE edema or clubbing noted.  Labs/studies Results: Abdominopelvic CT films from 06/06/2015 reviewed with the patient . Thickening of gastric wall but stomach is nondistended. Left-sided colonic diverticulosis without diverticulitis.   Lab data from 06/01/2015 WBC 5.1, H&H 13.9 and 41.3 and platelet count 191K. Differential not performed. Electrolytes within normal limits. BUN 14 and creatinine 0.67 Calcium 9.1.   Assessment:  #1. Epigastric pain of 3 months duration associated with nausea and 8-10 pound weight loss unresponsive to double dose PPI therapy which she is on primarily for GERD. No evidence of  pancreatic mass or cholelithiasis on CT which reveals thickening to gastric wall but it may be due to poor distention. Last EGD was in April this year and no abnormality noted to gastric wall. She will need an EGD unless an etiology found to account for symptoms. Pain is not typical of IBS. She could have peptic ulcer disease. #2. Chronic GERD. Symptoms are well controlled with PPI.   Recommendations:  Eosinophil count. Serum amylase lipase and LFTs. Upper abdominal ultrasound(last ultrasound was in 2011 suggesting sludge or small stones). Dicyclomine 10 mg by mouth twice a day when necessary. If these studies are negative will proceed with diagnostic EGD.

## 2015-06-09 LAB — HEPATIC FUNCTION PANEL
ALT: 21 U/L (ref 6–29)
AST: 29 U/L (ref 10–35)
Albumin: 4.6 g/dL (ref 3.6–5.1)
Alkaline Phosphatase: 43 U/L (ref 33–130)
Bilirubin, Direct: 0.2 mg/dL (ref <=0.2)
Indirect Bilirubin: 0.6 mg/dL (ref 0.2–1.2)
TOTAL PROTEIN: 7.4 g/dL (ref 6.1–8.1)
Total Bilirubin: 0.8 mg/dL (ref 0.2–1.2)

## 2015-06-09 LAB — LIPASE: LIPASE: 31 U/L (ref 7–60)

## 2015-06-09 LAB — AMYLASE: Amylase: 37 U/L (ref 0–105)

## 2015-06-12 ENCOUNTER — Ambulatory Visit (HOSPITAL_COMMUNITY)
Admission: RE | Admit: 2015-06-12 | Discharge: 2015-06-12 | Disposition: A | Discharge status: Home / Self Care | Payer: Commercial Managed Care - HMO | Source: Ambulatory Visit | Attending: Family Medicine | Admitting: Family Medicine

## 2015-06-12 DIAGNOSIS — Z1231 Encounter for screening mammogram for malignant neoplasm of breast: Secondary | ICD-10-CM

## 2015-06-15 ENCOUNTER — Ambulatory Visit (HOSPITAL_COMMUNITY)
Admission: RE | Admit: 2015-06-15 | Discharge: 2015-06-15 | Disposition: A | Discharge status: Home / Self Care | Payer: Commercial Managed Care - HMO | Source: Ambulatory Visit | Attending: Internal Medicine | Admitting: Internal Medicine

## 2015-06-15 ENCOUNTER — Other Ambulatory Visit (INDEPENDENT_AMBULATORY_CARE_PROVIDER_SITE_OTHER): Payer: Self-pay | Admitting: Internal Medicine

## 2015-06-15 DIAGNOSIS — G8929 Other chronic pain: Secondary | ICD-10-CM

## 2015-06-15 DIAGNOSIS — K760 Fatty (change of) liver, not elsewhere classified: Secondary | ICD-10-CM | POA: Insufficient documentation

## 2015-06-15 DIAGNOSIS — R110 Nausea: Secondary | ICD-10-CM

## 2015-06-15 DIAGNOSIS — R1013 Epigastric pain: Secondary | ICD-10-CM | POA: Diagnosis not present

## 2015-06-16 ENCOUNTER — Encounter (HOSPITAL_COMMUNITY)
Admission: RE | Disposition: A | Discharge status: Home / Self Care | Payer: Self-pay | Source: Ambulatory Visit | Attending: Internal Medicine

## 2015-06-16 ENCOUNTER — Encounter (HOSPITAL_COMMUNITY): Payer: Self-pay | Admitting: *Deleted

## 2015-06-16 ENCOUNTER — Ambulatory Visit (HOSPITAL_COMMUNITY)
Admission: RE | Admit: 2015-06-16 | Discharge: 2015-06-16 | Disposition: A | Discharge status: Home / Self Care | Payer: Commercial Managed Care - HMO | Source: Ambulatory Visit | Attending: Internal Medicine | Admitting: Internal Medicine

## 2015-06-16 DIAGNOSIS — Z8051 Family history of malignant neoplasm of kidney: Secondary | ICD-10-CM | POA: Insufficient documentation

## 2015-06-16 DIAGNOSIS — R1013 Epigastric pain: Secondary | ICD-10-CM | POA: Insufficient documentation

## 2015-06-16 DIAGNOSIS — E7800 Pure hypercholesterolemia, unspecified: Secondary | ICD-10-CM | POA: Diagnosis not present

## 2015-06-16 DIAGNOSIS — K219 Gastro-esophageal reflux disease without esophagitis: Secondary | ICD-10-CM | POA: Insufficient documentation

## 2015-06-16 DIAGNOSIS — K2270 Barrett's esophagus without dysplasia: Secondary | ICD-10-CM | POA: Insufficient documentation

## 2015-06-16 DIAGNOSIS — R110 Nausea: Secondary | ICD-10-CM | POA: Insufficient documentation

## 2015-06-16 DIAGNOSIS — Z79899 Other long term (current) drug therapy: Secondary | ICD-10-CM | POA: Diagnosis not present

## 2015-06-16 DIAGNOSIS — R634 Abnormal weight loss: Secondary | ICD-10-CM | POA: Diagnosis not present

## 2015-06-16 DIAGNOSIS — Z7982 Long term (current) use of aspirin: Secondary | ICD-10-CM | POA: Insufficient documentation

## 2015-06-16 DIAGNOSIS — Z801 Family history of malignant neoplasm of trachea, bronchus and lung: Secondary | ICD-10-CM | POA: Insufficient documentation

## 2015-06-16 DIAGNOSIS — K3189 Other diseases of stomach and duodenum: Secondary | ICD-10-CM | POA: Diagnosis not present

## 2015-06-16 DIAGNOSIS — G8929 Other chronic pain: Secondary | ICD-10-CM

## 2015-06-16 DIAGNOSIS — E039 Hypothyroidism, unspecified: Secondary | ICD-10-CM | POA: Insufficient documentation

## 2015-06-16 DIAGNOSIS — R1111 Vomiting without nausea: Secondary | ICD-10-CM | POA: Diagnosis not present

## 2015-06-16 HISTORY — PX: ESOPHAGOGASTRODUODENOSCOPY: SHX5428

## 2015-06-16 SURGERY — EGD (ESOPHAGOGASTRODUODENOSCOPY)
Anesthesia: Moderate Sedation

## 2015-06-16 MED ORDER — SODIUM CHLORIDE 0.9 % IV SOLN
INTRAVENOUS | Status: DC
  Administered 2015-06-16: 1000 mL via INTRAVENOUS

## 2015-06-16 MED ORDER — MEPERIDINE HCL 50 MG/ML IJ SOLN
INTRAMUSCULAR | Status: AC
  Filled 2015-06-16: qty 1

## 2015-06-16 MED ORDER — MIDAZOLAM HCL 5 MG/5ML IJ SOLN
INTRAMUSCULAR | Status: DC | PRN
Start: 2015-06-16 — End: 2015-06-16
  Administered 2015-06-16 (×2): 2 mg via INTRAVENOUS

## 2015-06-16 MED ORDER — HYDROCODONE-ACETAMINOPHEN 5-325 MG PO TABS: 1.0000 | ORAL_TABLET | Freq: Three times a day (TID) | ORAL | Status: DC | PRN

## 2015-06-16 MED ORDER — BUTAMBEN-TETRACAINE-BENZOCAINE 2-2-14 % EX AERO
INHALATION_SPRAY | CUTANEOUS | Status: DC | PRN
  Administered 2015-06-16: 2 via TOPICAL

## 2015-06-16 MED ORDER — STERILE WATER FOR IRRIGATION IR SOLN
Status: DC | PRN
  Administered 2015-06-16: 14:00:00

## 2015-06-16 MED ORDER — MEPERIDINE HCL 50 MG/ML IJ SOLN
INTRAMUSCULAR | Status: DC | PRN
  Administered 2015-06-16 (×2): 25 mg via INTRAVENOUS

## 2015-06-16 MED ORDER — MIDAZOLAM HCL 5 MG/5ML IJ SOLN
INTRAMUSCULAR | Status: AC
  Filled 2015-06-16: qty 10

## 2015-06-16 NOTE — H&P (Signed)
Terry Allen is an 74 y.o. female.   Chief Complaint: Patient is here for EGD. HPI: Patient is 74 year old Caucasian female who presents with 3 history of epigastric pain associated with nausea and she has lost 8-10 pounds. Most of the time pain is dull and aching pain by the time she has intense pain. Pain may or may not get worse with meals. She was seen in ER on one occasion. Pain has been radiating intermittently into her chest but she's been evaluated by cardiologist and felt not to have angina. She has daily nausea without vomiting. She does not have good appetite. She says heartburns well controlled with PPI. She has history of short segment Barrett's esophagus. Last EGD was in April 2016 but biopsy was negative. She should've and abdominopelvic CT last week by Dr. Karie Kirks reveals thickened gastric wall which could be due to underdistention. She had normal by mouth count serum amylase and lipase and LFTs. She underwent upper abdominal ultrasound yesterday and was negative for cholelithiasis. She denies melena or rectal bleeding. She does not take OTC NSAIDs.  Past Medical History  Diagnosis Date  . Bulging lumbar disc   . Acid reflux   . Nerve damage   . Hypothyroidism   . High cholesterol   . Pelvic pain in female     due to bladder sling    Past Surgical History  Procedure Laterality Date  . Bladder suspension    . Esophagogastroduodenoscopy N/A 12/21/2014    Procedure: ESOPHAGOGASTRODUODENOSCOPY (EGD);  Surgeon: Rogene Houston, MD;  Location: [facility];  Service: Endoscopy;  Laterality: N/A;  210    Family History  Problem Relation Age of Onset  . Breast cancer Mother   . Heart disease Mother   . Diabetes Mother   . Lung cancer Father   . Kidney cancer Brother   . Healthy Brother   . Diabetes Son    Social History:  reports that she has never smoked. She has never used smokeless tobacco. She reports that she does not drink alcohol or use illicit  drugs.  Allergies: No Known Allergies  Medications Prior to Admission  Medication Sig Dispense Refill  . acetaminophen (TYLENOL) 500 MG tablet Take 1,000 mg by mouth every 6 (six) hours as needed.    Marland Kitchen aspirin 81 MG tablet Take 81 mg by mouth daily.    . calcium carbonate (OS-CAL) 600 MG TABS tablet Take 600 mg by mouth 2 (two) times daily with a meal.    . dicyclomine (BENTYL) 10 MG capsule Take 1 capsule (10 mg total) by mouth 2 (two) times daily as needed for spasms. 60 capsule 2  . gabapentin (NEURONTIN) 300 MG capsule Take 300 mg by mouth 3 (three) times daily.    Marland Kitchen levothyroxine (SYNTHROID, LEVOTHROID) 100 MCG tablet Take 100 mcg by mouth daily before breakfast. X 4 a weeks. Skip W-S-Sun    . Multiple Vitamin (MULTIVITAMIN) tablet Take 1 tablet by mouth daily.    . ondansetron (ZOFRAN) 4 MG tablet Take 1 tablet (4 mg total) by mouth every 8 (eight) hours as needed for nausea or vomiting. 30 tablet 0  . pantoprazole (PROTONIX) 40 MG tablet Take 40 mg by mouth 2 (two) times daily.    . simvastatin (ZOCOR) 40 MG tablet Take 40 mg by mouth daily.    Marland Kitchen HYDROcodone-acetaminophen (NORCO/VICODIN) 5-325 MG per tablet Take 1 tablet by mouth 3 (three) times daily as needed.    . polyethylene glycol (MIRALAX /  GLYCOLAX) packet Take 17 g by mouth daily. 30 each 0    No results found for this or any previous visit (from the past 48 hour(s)). US Abdomen Complete  06/15/2015   CLINICAL DATA:  Epigastric pain, nausea.  EXAM: ULTRASOUND ABDOMEN COMPLETE  COMPARISON:  Abdominal pelvic CT scan of June 07, 2015  FINDINGS: Gallbladder: The gallbladder is act adequately distended. There is echogenic bile or sludge in the dependent portion of the gallbladder. There is no gallbladder wall thickening, pericholecystic fluid, or positive sonographic Murphy's sign.  Common bile duct: Diameter: 5.5 mm  Liver: There is increased hepatic echotexture diffusely. There is no focal mass or ductal dilation.  IVC: No  abnormality visualized.  Pancreas: Visualized portion unremarkable.  Spleen: Size and appearance within normal limits.  Right Kidney: Length: 8.6 cm. Echogenicity within normal limits. No mass or hydronephrosis visualized.  Left Kidney: Length: 10.5 cm. Echogenicity within normal limits. No mass or hydronephrosis visualized.  Abdominal aorta: No aneurysm visualized.  Other findings: There is no ascites.  IMPRESSION: 1. Small amount of sludge within the gallbladder. No discrete stones. No sonographic evidence of acute cholecystitis. 2. Fatty infiltrative change of the liver. 3. No acute abnormality demonstrated elsewhere within the abdomen.   Electronically Signed   By: David  Martinique M.D.   On: 06/15/2015 10:01    ROS  Blood pressure 142/71, pulse 72, temperature 98.2 F (36.8 C), temperature source Oral, resp. rate 12, height 5\' 2"  (1.575 m), weight 125 lb (56.7 kg), SpO2 100 %. Physical Exam  Constitutional: She appears well-developed and well-nourished.  HENT:  Mouth/Throat: Oropharynx is clear and moist.  Eyes: Conjunctivae are normal. No scleral icterus.  Neck: No thyromegaly present.  Cardiovascular: Normal rate, regular rhythm and normal heart sounds.   No murmur heard. Respiratory: Effort normal and breath sounds normal.  GI:  Abdomen is symmetrical and soft with mild midepigastric tenderness. No organomegaly or masses.  Musculoskeletal: She exhibits no edema.  Lymphadenopathy:    She has no cervical adenopathy.  Neurological: She is alert.  Skin: Skin is warm and dry.     Assessment/Plan Epigastric pain of 3 months duration associated with nausea and 8-10 pound weight loss negative workup as above. Diagnostic EGD.  Edvardo Honse U 06/16/2015, 1:58 PM

## 2015-06-16 NOTE — Op Note (Signed)
EGD PROCEDURE REPORT  PATIENT:  Terry Allen  MR#:  [id number] Birthdate:  08-31-1941, 74 y.o., female Endoscopist:  Dr. Rogene Houston, MD Referred By:  Dr. Estill Bamberg. Karie Kirks, M.D.  Procedure Date: 06/16/2015  Procedure:   EGD  Indications:  Patient is 74 year old Caucasian female was chronic GERD and history of short segment that is esophagus and her symptoms well controlled with PPI. She now presents with three-month history of epigastric pain associated with postprandial nausea in 8-10 pound weight loss. Lab studies been normal including amylase lipase serum calcium and LFTs. Abdominopelvic CT was unremarkable other than thickening to gastric wall possibly from poor distention. She did not get relief with dicyclomine. Ultrasound reveals sludge but no stones in the gallbladder. She is undergoing diagnostic EGD.            Informed Consent:  The risks, benefits, alternatives & imponderables which include, but are not limited to, bleeding, infection, perforation, drug reaction and potential missed lesion have been reviewed.  The potential for biopsy, lesion removal, esophageal dilation, etc. have also been discussed.  Questions have been answered.  All parties agreeable.  Please see history & physical in medical record for more information.  Medications:  Demerol 50 mg IV Versed 4 mg IV Cetacaine spray topically for oropharyngeal anesthesia  Description of procedure:  The endoscope was introduced through the mouth and advanced to the second portion of the duodenum without difficulty or limitations. The mucosal surfaces were surveyed very carefully during advancement of the scope and upon withdrawal.  Findings:  Esophagus: Mucosa of the esophagus was normal. GE junction was wavy with few tiny islands of salmon colored mucosa. No erosions or ulcers noted. GEJ:  38 cm Stomach:  Stomach was empty and distended very well with insufflation. Folds in the proximal stomach were normal.  Examination mucosa gastric body, antrum, pyloric channel, Gladys fundus and cardia was normal. Duodenum:  Normal bulbar and post bulbar mucosa.  Therapeutic/Diagnostic Maneuvers Performed:  None  Complications:  None  EBL: None  Impression: No evidence of peptic ulcer disease or gastritis. Wavy GE junction with tiny islands of salmon colored mucosa secondary to short segment Barrett's esophagus documented previously.  Recommendations:  Standard instructions given. Discontinue dicyclomine as it is not helping. HIDA scan with [facility] next week. New prescription given for hydrocodone/acetaminophen 5/325 one 3 times a day when necessary 30 doses without refill.  REHMAN,NAJEEB U  06/16/2015  2:29 PM  CC: Dr. Robert Bellow, MD & Dr. Rayne Du ref. provider found

## 2015-06-16 NOTE — Discharge Instructions (Signed)
Discontinue dicyclomine. Resume other medications and diet as before. Prescription for hydrocodone/acetaminophen 5/325 given for 30 doses. Take 1 tablet up to 3 times a day as needed for moderate to severe pain. No driving for 24 hours. HIDA scan with [facility] to be scheduled next week. Please keep a symptom diary    Esophagogastroduodenoscopy, Care After Refer to this sheet in the next few weeks. These instructions provide you with information about caring for yourself after your procedure. Your health care provider may also give you more specific instructions. Your treatment has been planned according to current medical practices, but problems sometimes occur. Call your health care provider if you have any problems or questions after your procedure. WHAT TO EXPECT AFTER THE PROCEDURE After your procedure, it is typical to feel:  Soreness in your throat.  Pain with swallowing.  Sick to your stomach (nauseous).  Bloated.  Dizzy.  Fatigued. HOME CARE INSTRUCTIONS  Do not eat or drink anything until the numbing medicine (local anesthetic) has worn off and your gag reflex has returned. You will know that the local anesthetic has worn off when you can swallow comfortably.  Do not drive or operate machinery until directed by your health care provider.  Take medicines only as directed by your health care provider. SEEK MEDICAL CARE IF:   You cannot stop coughing.  You are not urinating at all or less than usual. SEEK IMMEDIATE MEDICAL CARE IF:  You have difficulty swallowing.  You cannot eat or drink.  You have worsening throat or chest pain.  You have dizziness or lightheadedness or you faint.  You have nausea or vomiting.  You have chills.  You have a fever.  You have severe abdominal pain.  You have black, tarry, or bloody stools.   This information is not intended to replace advice given to you by your health care provider. Make sure you discuss any questions you  have with your health care provider.   Document Released: 08/12/2012 Document Revised: 09/16/2014 Document Reviewed: 08/12/2012 Elsevier Interactive Patient Education [employer].

## 2015-06-21 ENCOUNTER — Other Ambulatory Visit (INDEPENDENT_AMBULATORY_CARE_PROVIDER_SITE_OTHER): Payer: Self-pay | Admitting: Internal Medicine

## 2015-06-21 DIAGNOSIS — R110 Nausea: Secondary | ICD-10-CM

## 2015-06-21 DIAGNOSIS — R1013 Epigastric pain: Principal | ICD-10-CM

## 2015-06-21 DIAGNOSIS — R634 Abnormal weight loss: Secondary | ICD-10-CM

## 2015-06-21 DIAGNOSIS — G8929 Other chronic pain: Secondary | ICD-10-CM

## 2015-06-23 ENCOUNTER — Encounter (HOSPITAL_COMMUNITY)
Admission: RE | Admit: 2015-06-23 | Discharge: 2015-06-23 | Disposition: A | Discharge status: Home / Self Care | Payer: Commercial Managed Care - HMO | Source: Ambulatory Visit | Attending: Internal Medicine | Admitting: Internal Medicine

## 2015-06-23 ENCOUNTER — Encounter (HOSPITAL_COMMUNITY): Payer: Self-pay

## 2015-06-23 DIAGNOSIS — R110 Nausea: Secondary | ICD-10-CM

## 2015-06-23 DIAGNOSIS — R634 Abnormal weight loss: Secondary | ICD-10-CM | POA: Insufficient documentation

## 2015-06-23 DIAGNOSIS — G8929 Other chronic pain: Secondary | ICD-10-CM | POA: Diagnosis not present

## 2015-06-23 DIAGNOSIS — R1013 Epigastric pain: Secondary | ICD-10-CM | POA: Insufficient documentation

## 2015-06-23 MED ORDER — STERILE WATER FOR INJECTION IJ SOLN
INTRAMUSCULAR | Status: AC
  Administered 2015-06-23: 5 mL
  Filled 2015-06-23: qty 10

## 2015-06-23 MED ORDER — SINCALIDE 5 MCG IJ SOLR
INTRAMUSCULAR | Status: AC
  Administered 2015-06-23: 1.14 ug
  Filled 2015-06-23: qty 5

## 2015-06-23 MED ORDER — TECHNETIUM TC 99M MEBROFENIN IV KIT
5.0000 | PACK | Freq: Once | INTRAVENOUS | Status: DC | PRN
  Administered 2015-06-23: 5.5 via INTRAVENOUS
  Filled 2015-06-23: qty 6

## 2015-06-26 ENCOUNTER — Encounter (HOSPITAL_COMMUNITY): Payer: Self-pay | Admitting: Internal Medicine

## 2015-09-21 ENCOUNTER — Encounter: Payer: Self-pay | Admitting: Family Medicine

## 2015-09-21 ENCOUNTER — Telehealth: Payer: Self-pay | Admitting: Family Medicine

## 2015-09-21 ENCOUNTER — Encounter: Payer: Self-pay | Admitting: Physician Assistant

## 2015-09-21 ENCOUNTER — Ambulatory Visit (INDEPENDENT_AMBULATORY_CARE_PROVIDER_SITE_OTHER): Payer: PPO | Admitting: Physician Assistant

## 2015-09-21 VITALS — BP 120/82 | HR 76 | Temp 98.0°F | Resp 18 | Ht 61.0 in | Wt 126.0 lb

## 2015-09-21 DIAGNOSIS — R39198 Other difficulties with micturition: Secondary | ICD-10-CM

## 2015-09-21 DIAGNOSIS — Z87440 Personal history of urinary (tract) infections: Secondary | ICD-10-CM | POA: Diagnosis not present

## 2015-09-21 LAB — URINALYSIS, ROUTINE W REFLEX MICROSCOPIC
Bilirubin Urine: NEGATIVE
Glucose, UA: NEGATIVE
KETONES UR: NEGATIVE
LEUKOCYTES UA: NEGATIVE
NITRITE: NEGATIVE
Protein, ur: NEGATIVE
SPECIFIC GRAVITY, URINE: 1.02 (ref 1.001–1.035)
pH: 7.5 (ref 5.0–8.0)

## 2015-09-21 LAB — URINALYSIS, MICROSCOPIC ONLY
CRYSTALS: NONE SEEN [HPF]
Casts: NONE SEEN [LPF]
WBC UA: NONE SEEN WBC/HPF (ref <=5)
Yeast: NONE SEEN [HPF]

## 2015-09-21 NOTE — Progress Notes (Signed)
Patient ID: KADIAN MURREY MRN: [id number], DOB: 1940/10/30, 75 y.o. Date of Encounter: 09/21/2015, 11:33 AM    Chief Complaint:  Chief Complaint  Patient presents with  . c/o UTI    new pt has est care visit coming up, here today for sick visit     HPI: 75 y.o. year old white female presents with above.   Has been accepted as a new patient and is scheduled for a complete physical next month. However prior to being seen for that "new patient visit", she developed this issue and was added to my schedule to be evaluated today.  She says that she has been having some achy discomfort around her low back and low abdomen and points with her finger all around her entire lower abdomen and back. Says that when she took the dogs out, she decided that she would go ahead and go to the bathroom while they were, so she would have to get back up to go to the bathroom herself later. Says that when she went to the bathroom at that time she "had a hard time getting the flow going and only a little bit of urine came out." Says "It might have been just didn't need to go". Says that also here when she went to leave the urine sample she was not able to leave a full cup full. However says that she had only drank 3 or 4 sips this morning.  Says that she does not feel like her bladder is full. Says that she does not feel that she needs to urinate but is unable to do so.  Also discusses that in the past she was told that she had a kidney infection caused by Escherichia coli. She asked me about this and she was concerned and thought that because she had that problem before that she was at increased risk of having Escherichia coli now. Says that in the past she was just told those terms and wasn't given any explanation felt that the word Escherichia coli sound like something really serious.  Has had no pain at the costophrenic angle region. Has had no fevers or chills.  Points to her low back at  around L5-S1 region bilaterally as area of achy discomfort.     Home Meds:   Outpatient Prescriptions Prior to Visit  Medication Sig Dispense Refill  . acetaminophen (TYLENOL) 500 MG tablet Take 1,000 mg by mouth every 6 (six) hours as needed.    Marland Kitchen aspirin 81 MG tablet Take 81 mg by mouth daily.    . calcium carbonate (OS-CAL) 600 MG TABS tablet Take 600 mg by mouth 2 (two) times daily with a meal.    . esomeprazole (NEXIUM) 40 MG capsule Take 40 mg by mouth daily.  0  . gabapentin (NEURONTIN) 300 MG capsule Take 300 mg by mouth 3 (three) times daily.    Marland Kitchen HYDROcodone-acetaminophen (NORCO/VICODIN) 5-325 MG tablet Take 1 tablet by mouth 3 (three) times daily as needed for moderate pain. 30 tablet 0  . levothyroxine (SYNTHROID, LEVOTHROID) 75 MCG tablet take 1 tablet by mouth every morning ON AN EMPTY STOMACH  0  . Multiple Vitamin (MULTIVITAMIN) tablet Take 1 tablet by mouth daily.    . ondansetron (ZOFRAN) 4 MG tablet Take 1 tablet (4 mg total) by mouth every 8 (eight) hours as needed for nausea or vomiting. 30 tablet 0  . polyethylene glycol (MIRALAX / GLYCOLAX) packet Take 17 g by mouth daily. 30 each 0  .  simvastatin (ZOCOR) 40 MG tablet Take 40 mg by mouth daily.     No facility-administered medications prior to visit.    Allergies: No Known Allergies    Review of Systems: See HPI for pertinent ROS. All other ROS negative.    Physical Exam: Blood pressure 120/82, pulse 76, temperature 98 F (36.7 C), temperature source Oral, resp. rate 18, height 5\' 1"  (1.549 m), weight 126 lb (57.153 kg)., Body mass index is 23.82 kg/(m^2). General:  WNWD WF. Appears in no acute distress. Neck: Supple. No thyromegaly. No lymphadenopathy. Lungs: Clear bilaterally to auscultation without wheezes, rales, or rhonchi. Breathing is unlabored. Heart: Regular rhythm. No murmurs, rubs, or gallops. Abdomen: Soft, non-tender, non-distended with normoactive bowel sounds. No hepatomegaly. No  rebound/guarding. No obvious abdominal masses. Msk:  Strength and tone normal for age. No tenderness with percussion of costophrenic angles bilaterally. Extremities/Skin: Warm and dry. Neuro: Alert and oriented X 3. Moves all extremities spontaneously. Gait is normal. CNII-XII grossly in tact. Psych:  Responds to questions appropriately with a normal affect.     ASSESSMENT AND PLAN:  75 y.o. year old female with  1. Difficulty urinating  Reassured patient that urine dipstick and urine microscopic evaluation indicate no infection. She says that in the past the Escherichia coli was shown up on culture so will go ahead and send culture just to give her further reassurance.  Told her to go home and drink large amounts of water. If she has difficulty voiding following this intake, then follow-up.  - Urinalysis, Routine w reflex microscopic (not at [facility]) - Urine culture  2. History of UTI - Urine culture   Signed, [facility] [place], [place], [facility] 09/21/2015 11:33 AM

## 2015-09-22 LAB — URINE CULTURE
Colony Count: NO GROWTH
Organism ID, Bacteria: NO GROWTH

## 2015-10-02 ENCOUNTER — Ambulatory Visit (INDEPENDENT_AMBULATORY_CARE_PROVIDER_SITE_OTHER): Payer: PPO | Admitting: Family Medicine

## 2015-10-02 ENCOUNTER — Encounter: Payer: Self-pay | Admitting: Family Medicine

## 2015-10-02 VITALS — BP 118/70 | HR 77 | Temp 98.2°F | Resp 16 | Ht 65.0 in | Wt 125.0 lb

## 2015-10-02 DIAGNOSIS — E038 Other specified hypothyroidism: Secondary | ICD-10-CM

## 2015-10-02 DIAGNOSIS — Z13220 Encounter for screening for lipoid disorders: Secondary | ICD-10-CM | POA: Diagnosis not present

## 2015-10-02 DIAGNOSIS — Z0000 Encounter for general adult medical examination without abnormal findings: Secondary | ICD-10-CM

## 2015-10-02 DIAGNOSIS — Z124 Encounter for screening for malignant neoplasm of cervix: Secondary | ICD-10-CM | POA: Diagnosis not present

## 2015-10-02 DIAGNOSIS — Z1151 Encounter for screening for human papillomavirus (HPV): Secondary | ICD-10-CM | POA: Diagnosis not present

## 2015-10-02 NOTE — Progress Notes (Signed)
Subjective:    Patient ID: Terry Allen, female    DOB: 1940/10/01, 75 y.o.   MRN: [id number]  HPI Patient is here today to establish care. Mammogram is up-to-date. Colonoscopy is up-to-date per the patient's report. She is due for a Pap smear. She's had Pneumovax 23 along with her flu shot. She is due for Prevnar 13 along with the shingles vaccine. She is also due for a bone density.  She also reports fatigue and she is concerned that she is not on the appropriate dose of levothyroxine.  She also reports chronic pelvic pain. In the past she had a bladder sling surgery. Last year she developed severe burning pelvic pain. Workup included EGD, ultrasound of the abdomen, CT scan of abdomen and pelvis. Workup was unremarkable. She ultimately underwent surgical resection of the bladder sling material by Dr. Burke Keels at [facility] which seemed to help with the burning pain in her pelvis and in her vagina. She was also placed on gabapentin 300 mg by mouth 3 times a day which also helped. Unfortunately the burning pain is returning. The pain is located inside the vagina. It is significantly exacerbated with bimanual exam today and pelvic exam. She reports adnexal tenderness bilaterally, cervical motion tenderness. She is physically tender to palpation near the posterior surgical changes palpated in the posterior fornix.  She denies fever or chills hematuria dysuria or vaginal discharge. She is not sexually active. She is divorced and not dating.  Past Medical History  Diagnosis Date  . Bulging lumbar disc   . Nerve damage   . Hypothyroidism   . High cholesterol   . Pelvic pain in female     due to bladder sling  . Acid reflux     Patient reports Barretts   Past Surgical History  Procedure Laterality Date  . Bladder suspension    . Esophagogastroduodenoscopy N/A 12/21/2014    Procedure: ESOPHAGOGASTRODUODENOSCOPY (EGD);  Surgeon: Rogene Houston, MD;  [facility]: [facility];  Service:  Endoscopy;  Laterality: N/A;  210  . Esophagogastroduodenoscopy N/A 06/16/2015    Procedure: ESOPHAGOGASTRODUODENOSCOPY (EGD);  Surgeon: Rogene Houston, MD;  Location: [facility];  Service: Endoscopy;  Laterality: N/A;  1250  . Appendectomy  1960   Current Outpatient Prescriptions on File Prior to Visit  Medication Sig Dispense Refill  . acetaminophen (TYLENOL) 500 MG tablet Take 1,000 mg by mouth every 6 (six) hours as needed.    Marland Kitchen aspirin 81 MG tablet Take 81 mg by mouth daily.    . calcium carbonate (OS-CAL) 600 MG TABS tablet Take 600 mg by mouth 2 (two) times daily with a meal.    . gabapentin (NEURONTIN) 300 MG capsule Take 300 mg by mouth 3 (three) times daily.    Marland Kitchen HYDROcodone-acetaminophen (NORCO/VICODIN) 5-325 MG tablet Take 1 tablet by mouth 3 (three) times daily as needed for moderate pain. 30 tablet 0  . levothyroxine (SYNTHROID, LEVOTHROID) 75 MCG tablet take 1 tablet by mouth every morning ON AN EMPTY STOMACH  0  . Multiple Vitamin (MULTIVITAMIN) tablet Take 1 tablet by mouth daily.    . ondansetron (ZOFRAN) 4 MG tablet Take 1 tablet (4 mg total) by mouth every 8 (eight) hours as needed for nausea or vomiting. 30 tablet 0   No current facility-administered medications on file prior to visit.   No Known Allergies Social History   Social History  . Marital Status: Divorced    Spouse Name: N/A  . Number of  Children: N/A  . Years of Education: N/A   Occupational History  . Not on file.   Social History Main Topics  . Smoking status: Never Smoker   . Smokeless tobacco: Never Used  . Alcohol Use: No  . Drug Use: No  . Sexual Activity: No   Other Topics Concern  . Not on file   Social History Narrative   Family History  Problem Relation Age of Onset  . Breast cancer Mother   . Heart disease Mother   . Diabetes Mother   . Cancer Mother     breast  . Stroke Mother   . Hypertension Mother   . Lung cancer Father   . Stroke Father   . Cancer Father     oat  cell cancer/lung  . Kidney cancer Brother   . Cancer Brother     renal cell cancer  . Healthy Brother   . Diabetes Son       Review of Systems  All other systems reviewed and are negative.      Objective:   Physical Exam  Constitutional: She is oriented to person, place, and time. She appears well-developed and well-nourished. No distress.  HENT:  Head: Normocephalic and atraumatic.  Right Ear: External ear normal.  Left Ear: External ear normal.  Nose: Nose normal.  Mouth/Throat: Oropharynx is clear and moist. No oropharyngeal exudate.  Eyes: Conjunctivae and EOM are normal. Pupils are equal, round, and reactive to light. Right eye exhibits no discharge. Left eye exhibits no discharge. No scleral icterus.  Neck: Normal range of motion. Neck supple. No JVD present. No tracheal deviation present. No thyromegaly present.  Cardiovascular: Normal rate, regular rhythm, normal heart sounds and intact distal pulses.  Exam reveals no gallop and no friction rub.   No murmur heard. Pulmonary/Chest: Effort normal and breath sounds normal. No stridor. No respiratory distress. She has no wheezes. She has no rales. She exhibits no tenderness.  Abdominal: Soft. Bowel sounds are normal. She exhibits no distension and no mass. There is no tenderness. There is no rebound and no guarding. Hernia confirmed negative in the right inguinal area and confirmed negative in the left inguinal area.  Genitourinary: There is no rash, tenderness, lesion or injury on the right labia. There is no rash, tenderness, lesion or injury on the left labia. Uterus is tender. Cervix exhibits motion tenderness. Right adnexum displays tenderness. Right adnexum displays no mass. Left adnexum displays tenderness. Left adnexum displays no mass. There is tenderness in the vagina.  Musculoskeletal: Normal range of motion. She exhibits no edema or tenderness.  Lymphadenopathy:    She has no cervical adenopathy.       Right: No  inguinal adenopathy present.       Left: No inguinal adenopathy present.  Neurological: She is alert and oriented to person, place, and time. She has normal reflexes. She displays normal reflexes. No cranial nerve deficit. She exhibits normal muscle tone. Coordination normal.  Skin: Skin is warm. No rash noted. She is not diaphoretic. No erythema. No pallor.  Psychiatric: She has a normal mood and affect. Her behavior is normal. Judgment and thought content normal.  Vitals reviewed.         Assessment & Plan:  Routine general medical examination at a health care facility - Plan: PAP, Thin Prep w/HPV rflx HPV Type 16/18  Screening cholesterol level - Plan: CBC with Differential/Platelet, COMPLETE METABOLIC PANEL WITH GFR, Lipid panel  Other specified hypothyroidism - Plan: TSH  I believe the patient's pelvic pain is likely neuropathic in nature. I reviewed the ultrasound reports of the abdomen, the CT reports of the abdomen and pelvis obtained in September which were reassuring. Her pain sounds neuropathic in nature. Her exam certainly supports neuropathic pain as pain is out of proportion any abnormality palpated on exam and she is most tender near postsurgical changes felt in the cervix. Therefore I recommended gradually increasing gabapentin to 600 mg by mouth 3 times a day. I would like her to return fasting for a CBC, CMP, fasting lipid panel, and TSH. I recommended that the patient received Prevnar 13 this fall. Also discussed the shingles vaccine. I will schedule the patient for a bone density test..

## 2015-10-04 ENCOUNTER — Other Ambulatory Visit: Payer: PPO

## 2015-10-04 DIAGNOSIS — E038 Other specified hypothyroidism: Secondary | ICD-10-CM | POA: Diagnosis not present

## 2015-10-04 DIAGNOSIS — Z13220 Encounter for screening for lipoid disorders: Secondary | ICD-10-CM | POA: Diagnosis not present

## 2015-10-04 LAB — CBC WITH DIFFERENTIAL/PLATELET
BASOS PCT: 1 % (ref 0–1)
Basophils Absolute: 0.1 10*3/uL (ref 0.0–0.1)
Eosinophils Absolute: 0.2 10*3/uL (ref 0.0–0.7)
Eosinophils Relative: 3 % (ref 0–5)
HEMATOCRIT: 40.2 % (ref 36.0–46.0)
HEMOGLOBIN: 13.3 g/dL (ref 12.0–15.0)
LYMPHS PCT: 36 % (ref 12–46)
Lymphs Abs: 1.9 10*3/uL (ref 0.7–4.0)
MCH: 31 pg (ref 26.0–34.0)
MCHC: 33.1 g/dL (ref 30.0–36.0)
MCV: 93.7 fL (ref 78.0–100.0)
MONO ABS: 0.5 10*3/uL (ref 0.1–1.0)
MONOS PCT: 9 % (ref 3–12)
MPV: 10.4 fL (ref 8.6–12.4)
NEUTROS ABS: 2.7 10*3/uL (ref 1.7–7.7)
NEUTROS PCT: 51 % (ref 43–77)
Platelets: 229 10*3/uL (ref 150–400)
RBC: 4.29 MIL/uL (ref 3.87–5.11)
RDW: 13.5 % (ref 11.5–15.5)
WBC: 5.2 10*3/uL (ref 4.0–10.5)

## 2015-10-04 LAB — PAP, THIN PREP W/HPV RFLX HPV TYPE 16/18: HPV DNA HIGH RISK: NOT DETECTED

## 2015-10-04 LAB — COMPLETE METABOLIC PANEL WITH GFR
ALBUMIN: 4 g/dL (ref 3.6–5.1)
ALK PHOS: 48 U/L (ref 33–130)
ALT: 14 U/L (ref 6–29)
AST: 26 U/L (ref 10–35)
BILIRUBIN TOTAL: 1.3 mg/dL — AB (ref 0.2–1.2)
BUN: 12 mg/dL (ref 7–25)
CALCIUM: 9.4 mg/dL (ref 8.6–10.4)
CO2: 31 mmol/L (ref 20–31)
Chloride: 101 mmol/L (ref 98–110)
Creat: 0.65 mg/dL (ref 0.60–0.93)
GFR, Est African American: >89 mL/min (ref >=60)
GFR, Est Non African American: 88 mL/min (ref >=60)
GLUCOSE: 71 mg/dL (ref 70–99)
Potassium: 3.7 mmol/L (ref 3.5–5.3)
SODIUM: 141 mmol/L (ref 135–146)
TOTAL PROTEIN: 6.8 g/dL (ref 6.1–8.1)

## 2015-10-04 LAB — LIPID PANEL
CHOLESTEROL: 213 mg/dL — AB (ref 125–200)
HDL: 54 mg/dL (ref >=46)
LDL Cholesterol: 122 mg/dL (ref <130)
Total CHOL/HDL Ratio: 3.9 Ratio (ref <=5.0)
Triglycerides: 187 mg/dL — ABNORMAL HIGH (ref <150)
VLDL: 37 mg/dL — ABNORMAL HIGH (ref <30)

## 2015-10-04 LAB — TSH: TSH: 2.207 u[IU]/mL (ref 0.350–4.500)

## 2015-10-05 ENCOUNTER — Encounter: Payer: Self-pay | Admitting: *Deleted

## 2015-10-05 ENCOUNTER — Encounter: Payer: Self-pay | Admitting: Family Medicine

## 2015-10-10 ENCOUNTER — Ambulatory Visit (INDEPENDENT_AMBULATORY_CARE_PROVIDER_SITE_OTHER): Payer: Commercial Managed Care - HMO | Admitting: Internal Medicine

## 2015-10-10 ENCOUNTER — Other Ambulatory Visit: Payer: Self-pay | Admitting: Family Medicine

## 2015-10-11 ENCOUNTER — Telehealth: Payer: Self-pay | Admitting: Family Medicine

## 2015-10-11 NOTE — Telephone Encounter (Signed)
Dr. Dennard Schaumann wanted pt to take Gabapentin 4x a day. The pharmacy states that they will not be able to fill this quantity without a verbal order from Dr. Dennard Schaumann.  [facility] [place] Pt # [phone]

## 2015-10-11 NOTE — Telephone Encounter (Signed)
?   Ok to give verbally, saw in your last ov notes that you wanted her to gradually take 600mg  TID, is she suppose to take qid?

## 2015-10-12 NOTE — Telephone Encounter (Signed)
^))   tid was my recommendation not qid.

## 2015-10-13 ENCOUNTER — Ambulatory Visit: Payer: PPO | Admitting: Family Medicine

## 2015-10-13 ENCOUNTER — Ambulatory Visit (HOSPITAL_COMMUNITY)
Admission: RE | Admit: 2015-10-13 | Discharge: 2015-10-13 | Disposition: A | Discharge status: Home / Self Care | Payer: PPO | Source: Ambulatory Visit | Attending: Family Medicine | Admitting: Family Medicine

## 2015-10-13 DIAGNOSIS — Z780 Asymptomatic menopausal state: Secondary | ICD-10-CM | POA: Insufficient documentation

## 2015-10-13 DIAGNOSIS — M810 Age-related osteoporosis without current pathological fracture: Secondary | ICD-10-CM | POA: Diagnosis not present

## 2015-10-13 DIAGNOSIS — Z13820 Encounter for screening for osteoporosis: Secondary | ICD-10-CM | POA: Insufficient documentation

## 2015-10-13 DIAGNOSIS — M8580 Other specified disorders of bone density and structure, unspecified site: Secondary | ICD-10-CM | POA: Diagnosis not present

## 2015-10-13 DIAGNOSIS — Z0000 Encounter for general adult medical examination without abnormal findings: Secondary | ICD-10-CM | POA: Diagnosis not present

## 2015-10-16 ENCOUNTER — Encounter: Payer: Self-pay | Admitting: Family Medicine

## 2015-10-17 NOTE — Telephone Encounter (Signed)
Called left message on vm to return my call

## 2015-10-19 ENCOUNTER — Encounter: Payer: Self-pay | Admitting: Family Medicine

## 2015-10-23 ENCOUNTER — Telehealth: Payer: Self-pay | Admitting: Family Medicine

## 2015-10-23 MED ORDER — GABAPENTIN 300 MG PO CAPS: 600.0000 mg | ORAL_CAPSULE | Freq: Three times a day (TID) | ORAL | Status: DC

## 2015-10-23 NOTE — Telephone Encounter (Signed)
Pt states that her Gabapentin was increased to 600mg  tid and needs new rx for this - per LOV it was indeed increased and new rx sent to requested pharm

## 2015-10-23 NOTE — Telephone Encounter (Signed)
[facility] [place]  Patient would like for you to call her regarding her gabapentin and about changing doses  [phone]

## 2015-10-24 ENCOUNTER — Ambulatory Visit (INDEPENDENT_AMBULATORY_CARE_PROVIDER_SITE_OTHER): Payer: PPO | Admitting: Family Medicine

## 2015-10-24 ENCOUNTER — Encounter: Payer: Self-pay | Admitting: Family Medicine

## 2015-10-24 VITALS — BP 146/98 | HR 78 | Temp 97.8°F | Resp 16 | Ht 61.0 in | Wt 132.0 lb

## 2015-10-24 DIAGNOSIS — R102 Pelvic and perineal pain: Secondary | ICD-10-CM | POA: Diagnosis not present

## 2015-10-24 NOTE — Progress Notes (Signed)
Subjective:    Patient ID: Terry Allen, female    DOB: [birth year], 75 y.o.   MRN: [id number]  HPI 10/02/15 Patient is here today to establish care. Mammogram is up-to-date. Colonoscopy is up-to-date per the patient's report. She is due for a Pap smear. She's had Pneumovax 23 along with her flu shot. She is due for Prevnar 13 along with the shingles vaccine. She is also due for a bone density.  She also reports fatigue and she is concerned that she is not on the appropriate dose of levothyroxine.  She also reports chronic pelvic pain. In the past she had a bladder sling surgery. Last year she developed severe burning pelvic pain. Workup included EGD, ultrasound of the abdomen, CT scan of abdomen and pelvis. Workup was unremarkable. She ultimately underwent surgical resection of the bladder sling material by Dr. Burke Keels at [facility] which seemed to help with the burning pain in her pelvis and in her vagina. She was also placed on gabapentin 300 mg by mouth 3 times a day which also helped. Unfortunately the burning pain is returning. The pain is located inside the vagina. It is significantly exacerbated with bimanual exam today and pelvic exam. She reports adnexal tenderness bilaterally, cervical motion tenderness. She is physically tender to palpation near the posterior surgical changes palpated in the posterior fornix.  She denies fever or chills hematuria dysuria or vaginal discharge. She is not sexually active. She is divorced and not dating. At that time, my plan was: I believe the patient's pelvic pain is likely neuropathic in nature. I reviewed the ultrasound reports of the abdomen, the CT reports of the abdomen and pelvis obtained in September which were reassuring. Her pain sounds neuropathic in nature. Her exam certainly supports neuropathic pain as pain is out of proportion any abnormality palpated on exam and she is most tender near postsurgical changes felt in the cervix.  Therefore I recommended gradually increasing gabapentin to 600 mg by mouth 3 times a day. I would like her to return fasting for a CBC, CMP, fasting lipid panel, and TSH. I recommended that the patient received Prevnar 13 this fall. Also discussed the shingles vaccine. I will schedule the patient for a bone density test..  10/24/15  patient continues to have severe pelvic pain. She is also complaining of hemorrhoids. Increasing gabapentin helps a little bit but the pelvic pain seems to be worsening in general. She even has a difficult time sitting. She reports constant burning and stinging inside her vagina. She also has hemorrhoids she complains of perirectal itching and burning with defecation. She is tried Preparation H with no relief Past Medical History  Diagnosis Date  . Bulging lumbar disc   . Nerve damage   . Hypothyroidism   . High cholesterol   . Pelvic pain in female     due to bladder sling  . Acid reflux     Patient reports Barretts  . Osteopenia    Past Surgical History  Procedure Laterality Date  . Bladder suspension    . Esophagogastroduodenoscopy N/A 12/21/2014    Procedure: ESOPHAGOGASTRODUODENOSCOPY (EGD);  Surgeon: Rogene Houston, MD;  [facility]: [facility];  Service: Endoscopy;  Laterality: N/A;  210  . Esophagogastroduodenoscopy N/A 06/16/2015    Procedure: ESOPHAGOGASTRODUODENOSCOPY (EGD);  Surgeon: Rogene Houston, MD;  [facility]: [facility];  Service: Endoscopy;  Laterality: N/A;  1250  . Appendectomy  1960   Current Outpatient Prescriptions on File Prior to Visit  Medication Sig Dispense Refill  . acetaminophen (TYLENOL) 500 MG tablet Take 1,000 mg by mouth every 6 (six) hours as needed.    Marland Kitchen aspirin 81 MG tablet Take 81 mg by mouth daily.    . calcium carbonate (OS-CAL) 600 MG TABS tablet Take 600 mg by mouth 2 (two) times daily with a meal.    . gabapentin (NEURONTIN) 300 MG capsule Take 2 capsules (600 mg total) by mouth 3 (three) times daily. 540 capsule  4  . HYDROcodone-acetaminophen (NORCO/VICODIN) 5-325 MG tablet Take 1 tablet by mouth 3 (three) times daily as needed for moderate pain. 30 tablet 0  . levothyroxine (SYNTHROID, LEVOTHROID) 75 MCG tablet take 1 tablet by mouth every morning ON AN EMPTY STOMACH  0  . Multiple Vitamin (MULTIVITAMIN) tablet Take 1 tablet by mouth daily.    Marland Kitchen omeprazole (PRILOSEC) 20 MG capsule Take 20 mg by mouth daily.    . ondansetron (ZOFRAN) 4 MG tablet Take 1 tablet (4 mg total) by mouth every 8 (eight) hours as needed for nausea or vomiting. 30 tablet 0   No current facility-administered medications on file prior to visit.   No Known Allergies Social History   Social History  . Marital Status: Divorced    Spouse Name: N/A  . Number of Children: N/A  . Years of Education: N/A   Occupational History  . Not on file.   Social History Main Topics  . Smoking status: Never Smoker   . Smokeless tobacco: Never Used  . Alcohol Use: No  . Drug Use: No  . Sexual Activity: No   Other Topics Concern  . Not on file   Social History Narrative   Family History  Problem Relation Age of Onset  . Breast cancer Mother   . Heart disease Mother   . Diabetes Mother   . Cancer Mother     breast  . Stroke Mother   . Hypertension Mother   . Lung cancer Father   . Stroke Father   . Cancer Father     oat cell cancer/lung  . Kidney cancer Brother   . Cancer Brother     renal cell cancer  . Healthy Brother   . Diabetes Son       Review of Systems  All other systems reviewed and are negative.      Objective:   Physical Exam  Constitutional: She is oriented to person, place, and time. She appears well-developed and well-nourished. No distress.  HENT:  Head: Normocephalic and atraumatic.  Right Ear: External ear normal.  Left Ear: External ear normal.  Nose: Nose normal.  Mouth/Throat: Oropharynx is clear and moist. No oropharyngeal exudate.  Eyes: Conjunctivae and EOM are normal. Pupils are  equal, round, and reactive to light. Right eye exhibits no discharge. Left eye exhibits no discharge. No scleral icterus.  Neck: Normal range of motion. Neck supple. No JVD present. No tracheal deviation present. No thyromegaly present.  Cardiovascular: Normal rate, regular rhythm, normal heart sounds and intact distal pulses.  Exam reveals no gallop and no friction rub.   No murmur heard. Pulmonary/Chest: Effort normal and breath sounds normal. No stridor. No respiratory distress. She has no wheezes. She has no rales. She exhibits no tenderness.  Abdominal: Soft. Bowel sounds are normal. She exhibits no distension and no mass. There is no tenderness. There is no rebound and no guarding.  Musculoskeletal: Normal range of motion. She exhibits no edema or tenderness.  Lymphadenopathy:  She has no cervical adenopathy.  Neurological: She is alert and oriented to person, place, and time. She has normal reflexes. No cranial nerve deficit. She exhibits normal muscle tone. Coordination normal.  Skin: Skin is warm. No rash noted. She is not diaphoretic. No erythema. No pallor.  Psychiatric: She has a normal mood and affect. Her behavior is normal. Judgment and thought content normal.  Vitals reviewed.         Assessment & Plan:    patient's pelvic pain is not improving. I will discontinue gabapentin and try the patient on Lyrica 100 mg by mouth twice a day. She can use Anusol HC ointment applied 2-3 times a day to the rectum to help with itching and burning. Recheck in one week

## 2015-10-30 DIAGNOSIS — R102 Pelvic and perineal pain: Secondary | ICD-10-CM | POA: Diagnosis not present

## 2015-10-30 DIAGNOSIS — Z6824 Body mass index (BMI) 24.0-24.9, adult: Secondary | ICD-10-CM | POA: Diagnosis not present

## 2015-10-30 DIAGNOSIS — K594 Anal spasm: Secondary | ICD-10-CM | POA: Diagnosis not present

## 2015-11-01 ENCOUNTER — Ambulatory Visit (INDEPENDENT_AMBULATORY_CARE_PROVIDER_SITE_OTHER): Payer: PPO | Admitting: Internal Medicine

## 2016-01-05 ENCOUNTER — Encounter (INDEPENDENT_AMBULATORY_CARE_PROVIDER_SITE_OTHER): Payer: Self-pay | Admitting: *Deleted

## 2016-02-29 ENCOUNTER — Encounter: Payer: Self-pay | Admitting: Family Medicine

## 2016-02-29 ENCOUNTER — Ambulatory Visit (INDEPENDENT_AMBULATORY_CARE_PROVIDER_SITE_OTHER): Payer: PPO | Admitting: Family Medicine

## 2016-02-29 ENCOUNTER — Encounter (INDEPENDENT_AMBULATORY_CARE_PROVIDER_SITE_OTHER): Payer: Self-pay | Admitting: Internal Medicine

## 2016-02-29 VITALS — BP 142/88 | HR 80 | Temp 98.1°F | Resp 16 | Wt 120.0 lb

## 2016-02-29 DIAGNOSIS — R102 Pelvic and perineal pain: Secondary | ICD-10-CM

## 2016-02-29 DIAGNOSIS — R300 Dysuria: Secondary | ICD-10-CM

## 2016-02-29 DIAGNOSIS — K219 Gastro-esophageal reflux disease without esophagitis: Secondary | ICD-10-CM | POA: Diagnosis not present

## 2016-02-29 LAB — URINALYSIS, MICROSCOPIC ONLY
CASTS: NONE SEEN [LPF]
Crystals: NONE SEEN [HPF]
WBC, UA: >60 WBC/HPF — AB (ref <=5)
YEAST: NONE SEEN [HPF]

## 2016-02-29 LAB — URINALYSIS, ROUTINE W REFLEX MICROSCOPIC
BILIRUBIN URINE: NEGATIVE
GLUCOSE, UA: NEGATIVE
Ketones, ur: NEGATIVE
Nitrite: POSITIVE — AB
Specific Gravity, Urine: 1.025 (ref 1.001–1.035)
pH: 6 (ref 5.0–8.0)

## 2016-02-29 MED ORDER — PANTOPRAZOLE SODIUM 40 MG PO TBEC: 40.0000 mg | DELAYED_RELEASE_TABLET | Freq: Every day | ORAL | Status: DC

## 2016-02-29 MED ORDER — CIPROFLOXACIN HCL 500 MG PO TABS: 500.0000 mg | ORAL_TABLET | Freq: Two times a day (BID) | ORAL | Status: DC

## 2016-02-29 NOTE — Progress Notes (Signed)
Subjective:    Patient ID: Simona Huh, female    DOB: 01-11-1941, 75 y.o.   MRN: [id number]  HPI 10/02/15 Patient is here today to establish care. Mammogram is up-to-date. Colonoscopy is up-to-date per the patient's report. She is due for a Pap smear. She's had Pneumovax 23 along with her flu shot. She is due for Prevnar 13 along with the shingles vaccine. She is also due for a bone density.  She also reports fatigue and she is concerned that she is not on the appropriate dose of levothyroxine.  She also reports chronic pelvic pain. In the past she had a bladder sling surgery. Last year she developed severe burning pelvic pain. Workup included EGD, ultrasound of the abdomen, CT scan of abdomen and pelvis. Workup was unremarkable. She ultimately underwent surgical resection of the bladder sling material by Dr. Burke Keels at [facility] which seemed to help with the burning pain in her pelvis and in her vagina. She was also placed on gabapentin 300 mg by mouth 3 times a day which also helped. Unfortunately the burning pain is returning. The pain is located inside the vagina. It is significantly exacerbated with bimanual exam today and pelvic exam. She reports adnexal tenderness bilaterally, cervical motion tenderness. She is physically tender to palpation near the posterior surgical changes palpated in the posterior fornix.  She denies fever or chills hematuria dysuria or vaginal discharge. She is not sexually active. She is divorced and not dating. At that time, my plan was: I believe the patient's pelvic pain is likely neuropathic in nature. I reviewed the ultrasound reports of the abdomen, the CT reports of the abdomen and pelvis obtained in September which were reassuring. Her pain sounds neuropathic in nature. Her exam certainly supports neuropathic pain as pain is out of proportion any abnormality palpated on exam and she is most tender near postsurgical changes felt in the cervix.  Therefore I recommended gradually increasing gabapentin to 600 mg by mouth 3 times a day. I would like her to return fasting for a CBC, CMP, fasting lipid panel, and TSH. I recommended that the patient received Prevnar 13 this fall. Also discussed the shingles vaccine. I will schedule the patient for a bone density test..  10/24/15  patient continues to have severe pelvic pain. She is also complaining of hemorrhoids. Increasing gabapentin helps a little bit but the pelvic pain seems to be worsening in general. She even has a difficult time sitting. She reports constant burning and stinging inside her vagina. She also has hemorrhoids she complains of perirectal itching and burning with defecation. She is tried Preparation H with no relief.  AT that time, my plan was:  patient's pelvic pain is not improving. I will discontinue gabapentin and try the patient on Lyrica 100 mg by mouth twice a day. She can use Anusol HC ointment applied 2-3 times a day to the rectum to help with itching and burning. Recheck in one week  02/29/16 Patient saw her gynecologist who switched her back to gabapentin 300 mg 3 times a day and added amitriptyline 25 mg daily. This initially helped the pelvic pain although the pain is starting to return. She also reports burning with urination and frequency. Urinalysis is significant for pyuria as well as blood and nitrites. She also reports daily cough and indigestion and substernal discomfort with food. She is on omeprazole 20 mg a day but it no longer seems to be helping Past Medical History  Diagnosis Date  .  Bulging lumbar disc   . Nerve damage   . Hypothyroidism   . High cholesterol   . Pelvic pain in female     due to bladder sling  . Acid reflux     Patient reports Barretts  . Osteopenia    Past Surgical History  Procedure Laterality Date  . Bladder suspension    . Esophagogastroduodenoscopy N/A 12/21/2014    Procedure: ESOPHAGOGASTRODUODENOSCOPY (EGD);  Surgeon: Rogene Houston, MD;  Location: [facility];  Service: Endoscopy;  Laterality: N/A;  210  . Esophagogastroduodenoscopy N/A 06/16/2015    Procedure: ESOPHAGOGASTRODUODENOSCOPY (EGD);  Surgeon: Rogene Houston, MD;  Location: [facility];  Service: Endoscopy;  Laterality: N/A;  1250  . Appendectomy  1960   Current Outpatient Prescriptions on File Prior to Visit  Medication Sig Dispense Refill  . acetaminophen (TYLENOL) 500 MG tablet Take 1,000 mg by mouth every 6 (six) hours as needed.    Marland Kitchen aspirin 81 MG tablet Take 81 mg by mouth daily.    . calcium carbonate (OS-CAL) 600 MG TABS tablet Take 600 mg by mouth 2 (two) times daily with a meal.    . gabapentin (NEURONTIN) 300 MG capsule Take 2 capsules (600 mg total) by mouth 3 (three) times daily. 540 capsule 4  . HYDROcodone-acetaminophen (NORCO/VICODIN) 5-325 MG tablet Take 1 tablet by mouth 3 (three) times daily as needed for moderate pain. 30 tablet 0  . levothyroxine (SYNTHROID, LEVOTHROID) 75 MCG tablet take 1 tablet by mouth every morning ON AN EMPTY STOMACH  0  . Multiple Vitamin (MULTIVITAMIN) tablet Take 1 tablet by mouth daily.    Marland Kitchen omeprazole (PRILOSEC) 20 MG capsule Take 20 mg by mouth daily.    . ondansetron (ZOFRAN) 4 MG tablet Take 1 tablet (4 mg total) by mouth every 8 (eight) hours as needed for nausea or vomiting. 30 tablet 0   No current facility-administered medications on file prior to visit.   No Known Allergies Social History   Social History  . Marital Status: Divorced    Spouse Name: N/A  . Number of Children: N/A  . Years of Education: N/A   Occupational History  . Not on file.   Social History Main Topics  . Smoking status: Never Smoker   . Smokeless tobacco: Never Used  . Alcohol Use: No  . Drug Use: No  . Sexual Activity: No   Other Topics Concern  . Not on file   Social History Narrative   Family History  Problem Relation Age of Onset  . Breast cancer Mother   . Heart disease Mother   . Diabetes  Mother   . Cancer Mother     breast  . Stroke Mother   . Hypertension Mother   . Lung cancer Father   . Stroke Father   . Cancer Father     oat cell cancer/lung  . Kidney cancer Brother   . Cancer Brother     renal cell cancer  . Healthy Brother   . Diabetes Son       Review of Systems  All other systems reviewed and are negative.      Objective:   Physical Exam  Constitutional: She is oriented to person, place, and time. She appears well-developed and well-nourished. No distress.  HENT:  Head: Normocephalic and atraumatic.  Right Ear: External ear normal.  Left Ear: External ear normal.  Nose: Nose normal.  Mouth/Throat: Oropharynx is clear and moist. No oropharyngeal exudate.  Eyes: Conjunctivae and EOM are normal. Pupils are equal, round, and reactive to light. Right eye exhibits no discharge. Left eye exhibits no discharge. No scleral icterus.  Neck: Normal range of motion. Neck supple. No JVD present. No tracheal deviation present. No thyromegaly present.  Cardiovascular: Normal rate, regular rhythm, normal heart sounds and intact distal pulses.  Exam reveals no gallop and no friction rub.   No murmur heard. Pulmonary/Chest: Effort normal and breath sounds normal. No stridor. No respiratory distress. She has no wheezes. She has no rales. She exhibits no tenderness.  Abdominal: Soft. Bowel sounds are normal. She exhibits no distension and no mass. There is no tenderness. There is no rebound and no guarding.  Musculoskeletal: Normal range of motion. She exhibits no edema or tenderness.  Lymphadenopathy:    She has no cervical adenopathy.  Neurological: She is alert and oriented to person, place, and time. She has normal reflexes. No cranial nerve deficit. She exhibits normal muscle tone. Coordination normal.  Skin: Skin is warm. No rash noted. She is not diaphoretic. No erythema. No pallor.  Psychiatric: She has a normal mood and affect. Her behavior is normal. Judgment  and thought content normal.  Vitals reviewed.         Assessment & Plan:  Burning with urination - Plan: Urinalysis, Routine w reflex microscopic (not at [facility]), ciprofloxacin (CIPRO) 500 MG tablet  Gastroesophageal reflux disease without esophagitis - Plan: pantoprazole (PROTONIX) 40 MG tablet  Pelvic pain in female  Begin Cipro 500 mg by mouth twice a day for 3 days for urinary tract infection. Increase amitriptyline to 50 mg a day for her pelvic pain and continue gabapentin. Discontinue omeprazole and replaced with pantoprazole 40 mg by mouth daily for GERD

## 2016-03-13 ENCOUNTER — Telehealth: Payer: Self-pay | Admitting: Family Medicine

## 2016-03-13 NOTE — Telephone Encounter (Signed)
Patient calling to let dr pickard know she is doing better! :)

## 2016-03-28 ENCOUNTER — Ambulatory Visit (INDEPENDENT_AMBULATORY_CARE_PROVIDER_SITE_OTHER): Payer: PPO | Admitting: Internal Medicine

## 2016-04-04 ENCOUNTER — Ambulatory Visit (INDEPENDENT_AMBULATORY_CARE_PROVIDER_SITE_OTHER): Payer: PPO | Admitting: Internal Medicine

## 2016-04-08 DIAGNOSIS — X32XXXA Exposure to sunlight, initial encounter: Secondary | ICD-10-CM | POA: Diagnosis not present

## 2016-04-08 DIAGNOSIS — L820 Inflamed seborrheic keratosis: Secondary | ICD-10-CM | POA: Diagnosis not present

## 2016-04-08 DIAGNOSIS — D225 Melanocytic nevi of trunk: Secondary | ICD-10-CM | POA: Diagnosis not present

## 2016-04-08 DIAGNOSIS — L570 Actinic keratosis: Secondary | ICD-10-CM | POA: Diagnosis not present

## 2016-05-16 ENCOUNTER — Other Ambulatory Visit: Payer: Self-pay | Admitting: Family Medicine

## 2016-05-16 MED ORDER — SIMVASTATIN 40 MG PO TABS: 40.0000 mg | ORAL_TABLET | Freq: Every day | ORAL | 0 refills | Status: DC

## 2016-06-13 ENCOUNTER — Other Ambulatory Visit: Payer: Self-pay | Admitting: Family Medicine

## 2016-06-13 DIAGNOSIS — Z1231 Encounter for screening mammogram for malignant neoplasm of breast: Secondary | ICD-10-CM

## 2016-06-19 ENCOUNTER — Ambulatory Visit (HOSPITAL_COMMUNITY)
Admission: RE | Admit: 2016-06-19 | Discharge: 2016-06-19 | Disposition: A | Discharge status: Home / Self Care | Payer: PPO | Source: Ambulatory Visit | Attending: Family Medicine | Admitting: Family Medicine

## 2016-06-19 DIAGNOSIS — Z1231 Encounter for screening mammogram for malignant neoplasm of breast: Secondary | ICD-10-CM | POA: Diagnosis not present

## 2016-06-30 ENCOUNTER — Other Ambulatory Visit: Payer: Self-pay | Admitting: Family Medicine

## 2016-06-30 DIAGNOSIS — K219 Gastro-esophageal reflux disease without esophagitis: Secondary | ICD-10-CM

## 2016-07-15 ENCOUNTER — Telehealth: Payer: Self-pay | Admitting: Family Medicine

## 2016-07-15 MED ORDER — LEVOTHYROXINE SODIUM 75 MCG PO TABS: ORAL_TABLET | ORAL | 1 refills | Status: DC

## 2016-07-15 NOTE — Telephone Encounter (Signed)
Medication called/sent to requested pharmacy  

## 2016-07-15 NOTE — Telephone Encounter (Signed)
Cb# [phone]  Patient requesting a refill on her synthroid called into [facility] in [place].

## 2016-08-05 ENCOUNTER — Encounter: Payer: Self-pay | Admitting: Physician Assistant

## 2016-08-05 ENCOUNTER — Ambulatory Visit (INDEPENDENT_AMBULATORY_CARE_PROVIDER_SITE_OTHER): Payer: PPO | Admitting: Physician Assistant

## 2016-08-05 VITALS — BP 118/70 | HR 76 | Temp 97.8°F | Resp 16 | Wt 129.0 lb

## 2016-08-05 DIAGNOSIS — N3001 Acute cystitis with hematuria: Secondary | ICD-10-CM | POA: Diagnosis not present

## 2016-08-05 DIAGNOSIS — R309 Painful micturition, unspecified: Secondary | ICD-10-CM

## 2016-08-05 LAB — URINALYSIS, ROUTINE W REFLEX MICROSCOPIC
BILIRUBIN URINE: NEGATIVE
GLUCOSE, UA: NEGATIVE
Ketones, ur: NEGATIVE
Nitrite: NEGATIVE
PH: 7 (ref 5.0–8.0)
Protein, ur: NEGATIVE
SPECIFIC GRAVITY, URINE: 1.01 (ref 1.001–1.035)

## 2016-08-05 LAB — URINALYSIS, MICROSCOPIC ONLY
CRYSTALS: NONE SEEN [HPF]
Casts: NONE SEEN [LPF]
YEAST: NONE SEEN [HPF]

## 2016-08-05 MED ORDER — CIPROFLOXACIN HCL 500 MG PO TABS: 500.0000 mg | ORAL_TABLET | Freq: Two times a day (BID) | ORAL | 0 refills | Status: DC

## 2016-08-05 NOTE — Progress Notes (Signed)
Patient ID: ANNICE JOLLY MRN: [id number], DOB: 1941-05-24, 75 y.o. Date of Encounter: 08/05/2016, 12:46 PM    Chief Complaint:  Chief Complaint  Patient presents with  . painful urination    x 1 wk  . odor     HPI: 75 y.o. year old female presents with above.   Says that she is having pain with urination especially at the end of urination "can hardly stand it ". Has had no fever or chills. No back pain at level of costophrenic angle. No other complaints or concerns.     Home Meds:   Outpatient Medications Prior to Visit  Medication Sig Dispense Refill  . acetaminophen (TYLENOL) 500 MG tablet Take 1,000 mg by mouth every 6 (six) hours as needed.    Marland Kitchen amitriptyline (ELAVIL) 25 MG tablet     . aspirin 81 MG tablet Take 81 mg by mouth daily.    . calcium carbonate (OS-CAL) 600 MG TABS tablet Take 600 mg by mouth 2 (two) times daily with a meal.    . gabapentin (NEURONTIN) 300 MG capsule Take 2 capsules (600 mg total) by mouth 3 (three) times daily. 540 capsule 4  . levothyroxine (SYNTHROID, LEVOTHROID) 75 MCG tablet take 1 tablet by mouth every morning ON AN EMPTY STOMACH 90 tablet 1  . Multiple Vitamin (MULTIVITAMIN) tablet Take 1 tablet by mouth daily.    . pantoprazole (PROTONIX) 40 MG tablet TAKE ONE TABLET BY MOUTH ONCE DAILY 30 tablet 3  . simvastatin (ZOCOR) 40 MG tablet Take 1 tablet (40 mg total) by mouth daily. 30 tablet 0  . ciprofloxacin (CIPRO) 500 MG tablet Take 1 tablet (500 mg total) by mouth 2 (two) times daily. (Patient not taking: Reported on 08/05/2016) 6 tablet 0  . HYDROcodone-acetaminophen (NORCO/VICODIN) 5-325 MG tablet Take 1 tablet by mouth 3 (three) times daily as needed for moderate pain. (Patient not taking: Reported on 08/05/2016) 30 tablet 0  . omeprazole (PRILOSEC) 20 MG capsule Take 20 mg by mouth daily.    . ondansetron (ZOFRAN) 4 MG tablet Take 1 tablet (4 mg total) by mouth every 8 (eight) hours as needed for nausea or vomiting. (Patient  not taking: Reported on 08/05/2016) 30 tablet 0   No facility-administered medications prior to visit.     Allergies: No Known Allergies    Review of Systems: See HPI for pertinent ROS. All other ROS negative.    Physical Exam: Blood pressure 118/70, pulse 76, temperature 97.8 F (36.6 C), temperature source Oral, resp. rate 16, weight 129 lb (58.5 kg), SpO2 98 %., Body mass index is 24.37 kg/m. General:  WNWD WF. Appears in no acute distress. Neck: Supple. No thyromegaly. No lymphadenopathy. Lungs: Clear bilaterally to auscultation without wheezes, rales, or rhonchi. Breathing is unlabored. Heart: Regular rhythm. No murmurs, rubs, or gallops. Abdomen: Soft,  non-distended with normoactive bowel sounds. No hepatomegaly. No rebound/guarding. No obvious abdominal masses. She does report mild tenderness with palpation of the suprapubic region. Msk:  Strength and tone normal for age. No tenderness with percussion to costophrenic angles bilaterally. Extremities/Skin: Warm and dry.  Neuro: Alert and oriented X 3. Moves all extremities spontaneously. Gait is normal. CNII-XII grossly in tact. Psych:  Responds to questions appropriately with a normal affect.   Results for orders placed or performed in visit on 08/05/16  Urinalysis, Routine w reflex microscopic (not at [facility])  Result Value Ref Range   Color, Urine YELLOW YELLOW   APPearance CLOUDY (A) CLEAR  Specific Gravity, Urine 1.010 1.001 - 1.035   pH 7.0 5.0 - 8.0   Glucose, UA NEGATIVE NEGATIVE   Bilirubin Urine NEGATIVE NEGATIVE   Ketones, ur NEGATIVE NEGATIVE   Hgb urine dipstick 2+ (A) NEGATIVE   Protein, ur NEGATIVE NEGATIVE   Nitrite NEGATIVE NEGATIVE   Leukocytes, UA 1+ (A) NEGATIVE  Urine Microscopic  Result Value Ref Range   WBC, UA 40-60 (A) <=5 WBC/HPF   RBC / HPF 3-10 (A) <=2 RBC/HPF   Squamous Epithelial / LPF 0-5 <=5 HPF   Bacteria, UA FEW (A) NONE SEEN HPF   Crystals NONE SEEN NONE SEEN HPF   Casts NONE SEEN  NONE SEEN LPF   Yeast NONE SEEN NONE SEEN HPF     ASSESSMENT AND PLAN:  75 y.o. year old female with  1. Acute cystitis with hematuria She is to start Cipro immediately, take as directed, complete all of it. Will send urine culture and follow-up those results as well. Told her that if she were to develop fever or symptoms worsen significantly then call us immediately. - ciprofloxacin (CIPRO) 500 MG tablet; Take 1 tablet (500 mg total) by mouth 2 (two) times daily.  Dispense: 14 tablet; Refill: 0 - Urine culture  2. Painful urination - Urinalysis, Routine w reflex microscopic (not at [facility]) - Urine culture   Signed, [facility] [place], [place], [facility] 08/05/2016 12:46 PM

## 2016-08-08 LAB — URINE CULTURE

## 2016-09-12 ENCOUNTER — Ambulatory Visit (INDEPENDENT_AMBULATORY_CARE_PROVIDER_SITE_OTHER): Payer: PPO | Admitting: Physician Assistant

## 2016-09-12 ENCOUNTER — Emergency Department (HOSPITAL_COMMUNITY): Payer: PPO

## 2016-09-12 ENCOUNTER — Encounter: Payer: Self-pay | Admitting: Physician Assistant

## 2016-09-12 ENCOUNTER — Encounter (HOSPITAL_COMMUNITY): Payer: Self-pay | Admitting: Emergency Medicine

## 2016-09-12 ENCOUNTER — Emergency Department (HOSPITAL_COMMUNITY)
Admission: EM | Admit: 2016-09-12 | Discharge: 2016-09-12 | Disposition: A | Discharge status: Home / Self Care | Payer: PPO | Attending: Emergency Medicine | Admitting: Emergency Medicine

## 2016-09-12 VITALS — BP 120/70 | HR 82 | Temp 98.1°F | Resp 16 | Wt 128.0 lb

## 2016-09-12 DIAGNOSIS — R079 Chest pain, unspecified: Secondary | ICD-10-CM | POA: Diagnosis not present

## 2016-09-12 DIAGNOSIS — R296 Repeated falls: Secondary | ICD-10-CM | POA: Diagnosis not present

## 2016-09-12 DIAGNOSIS — R05 Cough: Secondary | ICD-10-CM | POA: Diagnosis not present

## 2016-09-12 DIAGNOSIS — S0990XA Unspecified injury of head, initial encounter: Secondary | ICD-10-CM | POA: Diagnosis not present

## 2016-09-12 DIAGNOSIS — E039 Hypothyroidism, unspecified: Secondary | ICD-10-CM | POA: Diagnosis not present

## 2016-09-12 DIAGNOSIS — R531 Weakness: Secondary | ICD-10-CM | POA: Diagnosis not present

## 2016-09-12 DIAGNOSIS — R2689 Other abnormalities of gait and mobility: Secondary | ICD-10-CM | POA: Diagnosis not present

## 2016-09-12 DIAGNOSIS — Z79899 Other long term (current) drug therapy: Secondary | ICD-10-CM | POA: Diagnosis not present

## 2016-09-12 DIAGNOSIS — Z7982 Long term (current) use of aspirin: Secondary | ICD-10-CM | POA: Diagnosis not present

## 2016-09-12 LAB — BASIC METABOLIC PANEL
ANION GAP: 8 (ref 5–15)
BUN: 13 mg/dL (ref 6–20)
CALCIUM: 9.2 mg/dL (ref 8.9–10.3)
CHLORIDE: 99 mmol/L — AB (ref 101–111)
CO2: 31 mmol/L (ref 22–32)
CREATININE: 0.81 mg/dL (ref 0.44–1.00)
GFR calc non Af Amer: >60 mL/min (ref >60)
Glucose, Bld: 106 mg/dL — ABNORMAL HIGH (ref 65–99)
Potassium: 3.3 mmol/L — ABNORMAL LOW (ref 3.5–5.1)
SODIUM: 138 mmol/L (ref 135–145)

## 2016-09-12 LAB — CBC
HCT: 42.1 % (ref 36.0–46.0)
HEMOGLOBIN: 13.9 g/dL (ref 12.0–15.0)
MCH: 32.2 pg (ref 26.0–34.0)
MCHC: 33 g/dL (ref 30.0–36.0)
MCV: 97.5 fL (ref 78.0–100.0)
PLATELETS: 232 10*3/uL (ref 150–400)
RBC: 4.32 MIL/uL (ref 3.87–5.11)
RDW: 13 % (ref 11.5–15.5)
WBC: 7.7 10*3/uL (ref 4.0–10.5)

## 2016-09-12 LAB — TROPONIN I

## 2016-09-12 MED ORDER — LEVOTHYROXINE SODIUM 75 MCG PO TABS: ORAL_TABLET | ORAL | 0 refills | Status: DC

## 2016-09-12 MED ORDER — SIMVASTATIN 40 MG PO TABS: 40.0000 mg | ORAL_TABLET | Freq: Every day | ORAL | 0 refills | Status: DC

## 2016-09-12 MED ORDER — POTASSIUM CHLORIDE CRYS ER 20 MEQ PO TBCR
40.0000 meq | EXTENDED_RELEASE_TABLET | Freq: Once | ORAL | Status: AC
  Administered 2016-09-12: 40 meq via ORAL
  Filled 2016-09-12: qty 2

## 2016-09-12 NOTE — ED Triage Notes (Signed)
Patient states she was sent over from Dr Dennard Schaumann for generalized weakness and falling x 4 months. Also complaining of chest pain and cough "off and on for months." States pain is radiating down left arm today.

## 2016-09-12 NOTE — ED Notes (Signed)
Patient transported to CT 

## 2016-09-12 NOTE — ED Provider Notes (Signed)
[facility] DEPT Provider Note   CSN: [id number] Arrival date & time: 09/12/16  1449  By signing my name below, I, Dora Sims, attest that this documentation has been prepared under the direction and in the presence of physician practitioner, Pattricia Boss, MD. Electronically Signed: Dora Sims, Scribe. 09/12/2016. 8:35 PM.  History   Chief Complaint Chief Complaint  Patient presents with  . Weakness    The history is provided by the patient. No language interpreter was used.     HPI Comments: Terry Allen is a 76 y.o. female brought in by family who presents to the Emergency Department complaining of frequent falls over the last 3 months. She states she has felt "off balance" on her feet but denies any dizziness or lightheadedness. She states she has fallen three times over the last several weeks. Pt reports pain and bruising to her lower extremities as a result of the falls. She denies head trauma or losing consciousness. She notes she has been ambulating with a crutch since 09/02/16 and has not fallen since using the cane. Pt was seen by her PCP for the same earlier today and was advised to come here to be evaluated. She uses several medications on a daily basis and denies any recent medication changes or using new medications. NKDA. She is on aspirin daily. She does not smoke or drink. Pt lives alone at home. She denies focal weakness or any other associated symptoms.  She is also complaining of lower sternal chest pain for several months. She notes associated left shoulder pain. She is unsure if these symptoms are related to her falls. She states she has been eating and drinking normally. She denies SOB, cough, fever, chills, or any other associated symptoms.  She additionally complains of a persistent, worsening headache for about one week. She has tried Tylenol with some improvement of her headache. No other complaints noted at this time.  Past Medical History:    Diagnosis Date  . Acid reflux    Patient reports Barretts  . Bulging lumbar disc   . High cholesterol   . Hypothyroidism   . Nerve damage   . Osteopenia   . Pelvic pain in female    due to bladder sling    Patient Active Problem List   Diagnosis Date Noted  . GERD (gastroesophageal reflux disease) 12/13/2014  . Barrett's esophagus 12/13/2014  . Hypothyroidism 12/13/2014  . High cholesterol 12/13/2014    Past Surgical History:  Procedure Laterality Date  . APPENDECTOMY  1960  . BLADDER SUSPENSION    . ESOPHAGOGASTRODUODENOSCOPY N/A 12/21/2014   Procedure: ESOPHAGOGASTRODUODENOSCOPY (EGD);  Surgeon: Rogene Houston, MD;  [facility];  Service: Endoscopy;  Laterality: N/A;  210  . ESOPHAGOGASTRODUODENOSCOPY N/A 06/16/2015   Procedure: ESOPHAGOGASTRODUODENOSCOPY (EGD);  Surgeon: Rogene Houston, MD;  Location: [facility];  Service: Endoscopy;  Laterality: N/A;  1250    OB History    No data available       Home Medications    Prior to Admission medications   Medication Sig Start Date End Date Taking? Authorizing Provider  acetaminophen (TYLENOL) 500 MG tablet Take 1,000 mg by mouth every 6 (six) hours as needed.    Historical Provider, MD  amitriptyline (ELAVIL) 25 MG tablet  01/29/16   Historical Provider, MD  aspirin 81 MG tablet Take 81 mg by mouth daily.    Historical Provider, MD  calcium carbonate (OS-CAL) 600 MG TABS tablet Take 600 mg by mouth 2 (  two) times daily with a meal.    Historical Provider, MD  ciprofloxacin (CIPRO) 500 MG tablet Take 1 tablet (500 mg total) by mouth 2 (two) times daily. Patient not taking: Reported on 09/12/2016 02/29/16   Susy Frizzle, MD  ciprofloxacin (CIPRO) 500 MG tablet Take 1 tablet (500 mg total) by mouth 2 (two) times daily. Patient not taking: Reported on 09/12/2016 08/05/16   Orlena Sheldon, PA-C  gabapentin (NEURONTIN) 300 MG capsule Take 2 capsules (600 mg total) by mouth 3 (three) times daily. 10/23/15   Susy Frizzle, MD  HYDROcodone-acetaminophen (NORCO/VICODIN) 5-325 MG tablet Take 1 tablet by mouth 3 (three) times daily as needed for moderate pain. 06/16/15   Rogene Houston, MD  levothyroxine (SYNTHROID, LEVOTHROID) 75 MCG tablet take 1 tablet by mouth every morning ON AN EMPTY STOMACH 09/12/16   Orlena Sheldon, PA-C  Multiple Vitamin (MULTIVITAMIN) tablet Take 1 tablet by mouth daily.    Historical Provider, MD  omeprazole (PRILOSEC) 20 MG capsule Take 20 mg by mouth daily.    Historical Provider, MD  ondansetron (ZOFRAN) 4 MG tablet Take 1 tablet (4 mg total) by mouth every 8 (eight) hours as needed for nausea or vomiting. 06/01/15   Katheren Shams, DO  pantoprazole (PROTONIX) 40 MG tablet TAKE ONE TABLET BY MOUTH ONCE DAILY 07/01/16   Susy Frizzle, MD  simvastatin (ZOCOR) 40 MG tablet Take 1 tablet (40 mg total) by mouth daily. 09/12/16   Terry B Dixon, PA-C  ZOSTAVAX 16073 UNT/0.65ML injection Inject 0.65 mLs into the muscle once. 04/28/16   Historical Provider, MD    Family History Family History  Problem Relation Age of Onset  . Breast cancer Mother   . Heart disease Mother   . Diabetes Mother   . Cancer Mother     breast  . Stroke Mother   . Hypertension Mother   . Lung cancer Father   . Stroke Father   . Cancer Father     oat cell cancer/lung  . Kidney cancer Brother   . Cancer Brother     renal cell cancer  . Healthy Brother   . Diabetes Son     Social History Social History  Substance Use Topics  . Smoking status: Never Smoker  . Smokeless tobacco: Never Used  . Alcohol use No     Allergies   Patient has no known allergies.   Review of Systems Review of Systems  All other systems reviewed and are negative.    Physical Exam Updated Vital Signs BP 120/80 (BP Location: Left Arm)   Pulse 74   Temp 98.4 F (36.9 C) (Temporal)   Resp 18   Ht 5\' 2"  (1.575 m)   Wt 128 lb (58.1 kg)   SpO2 99%   BMI 23.41 kg/m   Physical Exam  Constitutional: She is oriented  to person, place, and time. She appears well-developed and well-nourished. No distress.  HENT:  Head: Normocephalic and atraumatic.  Eyes: EOM are normal.  Neck: Normal range of motion.  Cardiovascular: Normal rate, regular rhythm and normal heart sounds.   Pulmonary/Chest: Effort normal and breath sounds normal.  Abdominal: Soft. She exhibits no distension. There is no tenderness.  Musculoskeletal: Normal range of motion.  No tenderness over the C, T, or L spine.  Neurological: She is alert and oriented to person, place, and time.  Steady gait.  Skin: Skin is warm and dry.  Psychiatric: She has a normal mood  and affect. Judgment normal.  Nursing note and vitals reviewed.    ED Treatments / Results  Labs (all labs ordered are listed, but only abnormal results are displayed) Labs Reviewed  BASIC METABOLIC PANEL - Abnormal; Notable for the following:       Result Value   Potassium 3.3 (*)    Chloride 99 (*)    Glucose, Bld 106 (*)    All other components within normal limits  CBC  TROPONIN I    EKG  EKG Interpretation  Date/Time:  Thursday September 12 2016 16:00:56 EST Ventricular Rate:  76 PR Interval:  172 QRS Duration: 74 QT Interval:  396 QTC Calculation: 445 R Axis:   46 Text Interpretation:  Normal sinus rhythm Normal ECG since last tracing no significant change Confirmed by Eulis Foster  MD, ELLIOTT [phone]) on 09/12/2016 5:05:48 PM       Radiology Dg Chest 2 View  Result Date: 09/12/2016 CLINICAL DATA:  The patient reports generalized weakness and episodes of falling for the past 4 months. Intermittent chest pain and cough as well. Patient reports chest pain radiating down the left arm today. EXAM: CHEST  2 VIEW COMPARISON:  Chest x-Sebrena Engh of February 24, 2012 FINDINGS: The lungs are well-expanded. There is no focal infiltrate. There is no pleural effusion. There is minimal linear density in the left lateral costophrenic angle likely reflecting scarring or atelectasis. There is  no pneumothorax or pneumomediastinum. The heart and pulmonary vascularity are normal. The mediastinum is normal in width. There is levocurvature centered at the thoracolumbar junction. There is mild multilevel degenerative disc disease of the thoracic spine. IMPRESSION: Minimal chronic bronchitic changes, stable. There is no acute cardiopulmonary abnormality. Electronically Signed   By: David  Martinique M.D.   On: 09/12/2016 16:26    Procedures Procedures (including critical care time)  DIAGNOSTIC STUDIES: Oxygen Saturation is 99% on RA, normal by my interpretation.    COORDINATION OF CARE: 8:44 PM Will order blood work and basic labs. Will order chest x-Cordia Miklos and CT Head w/o contrast. Will administer potassium chloride for hypokalemia. Discussed treatment plan with pt at bedside and pt agreed to plan.  Medications Ordered in ED Medications - No data to display   Initial Impression / Assessment and Plan / ED Course  I have reviewed the triage vital signs and the nursing notes.  Pertinent labs & imaging results that were available during my care of the patient were reviewed by me and considered in my medical decision making (see chart for details).  Clinical Course    Patient well appearing here and ambulated with normal gait.  Patient advised regarding fall precautions- specifically not to ambulate without her cane or walker.  Patient given neurology referral and voices understanding.   Final Clinical Impressions(s) / ED Diagnoses   Final diagnoses:  Weakness  Balance problem    New Prescriptions New Prescriptions   No medications on file   I personally performed the services described in this documentation, which was scribed in my presence. The recorded information has been reviewed and considered.    Pattricia Boss, MD 09/16/16 [phone]

## 2016-09-12 NOTE — Progress Notes (Signed)
Patient ID: CHARLESTON VIERLING MRN: [id number], DOB: 02-Nov-1940, 76 y.o. Date of Encounter: @DATE @  Chief Complaint:  Chief Complaint  Patient presents with  . Chest Pain  . left arm pain  . Nausea  . having fallen 3 times    right before christmas    HPI: 76 y.o. year old female  presents with above.  She states that she has fallen 3 times within the past 1-1/2 months. Says "it comes on with no warning " I asked if she thinks she is passing out and she says she doesn't think so. Says that she is using a cane now when she walks because she is afraid she will fall. Says that she has been feeling staggery and dizzy for several months.  Says that she thinks that the pain in her left arm and chest are secondary to the falls.   Past Medical History:  Diagnosis Date  . Acid reflux    Patient reports Barretts  . Bulging lumbar disc   . High cholesterol   . Hypothyroidism   . Nerve damage   . Osteopenia   . Pelvic pain in female    due to bladder sling     Home Meds: Outpatient Medications Prior to Visit  Medication Sig Dispense Refill  . acetaminophen (TYLENOL) 500 MG tablet Take 1,000 mg by mouth every 6 (six) hours as needed.    Marland Kitchen amitriptyline (ELAVIL) 25 MG tablet     . aspirin 81 MG tablet Take 81 mg by mouth daily.    . calcium carbonate (OS-CAL) 600 MG TABS tablet Take 600 mg by mouth 2 (two) times daily with a meal.    . gabapentin (NEURONTIN) 300 MG capsule Take 2 capsules (600 mg total) by mouth 3 (three) times daily. 540 capsule 4  . HYDROcodone-acetaminophen (NORCO/VICODIN) 5-325 MG tablet Take 1 tablet by mouth 3 (three) times daily as needed for moderate pain. 30 tablet 0  . levothyroxine (SYNTHROID, LEVOTHROID) 75 MCG tablet take 1 tablet by mouth every morning ON AN EMPTY STOMACH 90 tablet 1  . Multiple Vitamin (MULTIVITAMIN) tablet Take 1 tablet by mouth daily.    Marland Kitchen omeprazole (PRILOSEC) 20 MG capsule Take 20 mg by mouth daily.    . ondansetron (ZOFRAN) 4  MG tablet Take 1 tablet (4 mg total) by mouth every 8 (eight) hours as needed for nausea or vomiting. 30 tablet 0  . pantoprazole (PROTONIX) 40 MG tablet TAKE ONE TABLET BY MOUTH ONCE DAILY 30 tablet 3  . simvastatin (ZOCOR) 40 MG tablet Take 1 tablet (40 mg total) by mouth daily. 30 tablet 0  . ZOSTAVAX 41937 UNT/0.65ML injection Inject 0.65 mLs into the muscle once.    . ciprofloxacin (CIPRO) 500 MG tablet Take 1 tablet (500 mg total) by mouth 2 (two) times daily. (Patient not taking: Reported on 09/12/2016) 6 tablet 0  . ciprofloxacin (CIPRO) 500 MG tablet Take 1 tablet (500 mg total) by mouth 2 (two) times daily. (Patient not taking: Reported on 09/12/2016) 14 tablet 0   No facility-administered medications prior to visit.     Allergies: No Known Allergies  Social History   Social History  . Marital status: Divorced    Spouse name: N/A  . Number of children: N/A  . Years of education: N/A   Occupational History  . Not on file.   Social History Main Topics  . Smoking status: Never Smoker  . Smokeless tobacco: Never Used  . Alcohol use No  .  Drug use: No  . Sexual activity: No   Other Topics Concern  . Not on file   Social History Narrative  . No narrative on file    Family History  Problem Relation Age of Onset  . Breast cancer Mother   . Heart disease Mother   . Diabetes Mother   . Cancer Mother     breast  . Stroke Mother   . Hypertension Mother   . Lung cancer Father   . Stroke Father   . Cancer Father     oat cell cancer/lung  . Kidney cancer Brother   . Cancer Brother     renal cell cancer  . Healthy Brother   . Diabetes Son      Review of Systems:  See HPI for pertinent ROS. All other ROS negative.    Physical Exam: Blood pressure 120/70, pulse 82, temperature 98.1 F (36.7 C), temperature source Oral, resp. rate 16, weight 128 lb (58.1 kg), SpO2 98 %., Body mass index is 24.19 kg/m. General: WNWD WF Appears in no acute distress. Neck: Supple. No  thyromegaly. No lymphadenopathy. Lungs: Clear bilaterally to auscultation without wheezes, rales, or rhonchi. Breathing is unlabored. Heart: RRR with S1 S2. No murmurs, rubs, or gallops. Musculoskeletal:  Strength and tone normal for age. Extremities/Skin: Warm and dry.  Neuro: Alert and oriented X 3. Psych:  Responds to questions appropriately with a normal affect.     ASSESSMENT AND PLAN:  76 y.o. year old female with  1. Multiple falls  2. Chest pain, unspecified type  I have discussed with her that she needs to go immediately to the emergency room her they can do complete evaluation. A female friend drove her here to the visit and is here and can transport her directly to the emergency room. She then says that she at least needs to get her blood work done and asked her what blood work.  Says so that she can get her refills on her thyroid and cholesterol medicine.  She also says that she has to wait a full year for her physical but then she is coming to do physical at the end of the month once it is been full 12 months. I told her I will send in a month refill on her cholesterol and thyroid medicine to hold her over until that physical.  Explained that she needs to go directly to the emergency room for evaluation right now. She voices understanding and agrees.    Marin [place], [place], [facility] 09/12/2016 2:24 PM

## 2016-09-12 NOTE — Discharge Instructions (Signed)
Please call neurology for further evaluation.  Walk only with cane or walker.

## 2016-09-24 ENCOUNTER — Encounter: Payer: Self-pay | Admitting: Family Medicine

## 2016-09-24 ENCOUNTER — Ambulatory Visit (INDEPENDENT_AMBULATORY_CARE_PROVIDER_SITE_OTHER): Payer: PPO | Admitting: Family Medicine

## 2016-09-24 VITALS — BP 130/80 | HR 80 | Temp 98.8°F | Resp 18 | Ht 62.0 in | Wt 131.0 lb

## 2016-09-24 DIAGNOSIS — E876 Hypokalemia: Secondary | ICD-10-CM

## 2016-09-24 DIAGNOSIS — H6123 Impacted cerumen, bilateral: Secondary | ICD-10-CM | POA: Diagnosis not present

## 2016-09-24 DIAGNOSIS — R300 Dysuria: Secondary | ICD-10-CM

## 2016-09-24 LAB — URINALYSIS, ROUTINE W REFLEX MICROSCOPIC
Bilirubin Urine: NEGATIVE
Glucose, UA: NEGATIVE
KETONES UR: NEGATIVE
NITRITE: NEGATIVE
Protein, ur: NEGATIVE
pH: 5.5 (ref 5.0–8.0)

## 2016-09-24 LAB — URINALYSIS, MICROSCOPIC ONLY
CRYSTALS: NONE SEEN [HPF]
Casts: NONE SEEN [LPF]
Yeast: NONE SEEN [HPF]

## 2016-09-24 MED ORDER — SULFAMETHOXAZOLE-TRIMETHOPRIM 800-160 MG PO TABS
1.0000 | ORAL_TABLET | Freq: Two times a day (BID) | ORAL | 0 refills | Status: DC
Start: 2016-09-24 — End: 2016-10-09

## 2016-09-24 NOTE — Progress Notes (Signed)
Subjective:    Patient ID: Terry Allen, female    DOB: Nov 27, 1940, 76 y.o.   MRN: [id number]  HPI Patient is a complaining of bilateral hearing loss secondary to cerumen impactions. She also reports dysuria for several days, increased urinary frequency, hesitancy, as well as some pelvic discomfort. She also mentions that she has discontinued taking her pantoprazole due to the wrist to her kidneys. I explained to the patient that the risk of nephritis is extremely low and unlikely to happen and she should resume the pantoprazole that she is dealing with breakthrough daily acid reflux. She also recently went to emergency room and was told that her potassium was low. I reviewed the emergency room records and found that her potassium was 3.3. Past Medical History:  Diagnosis Date  . Acid reflux    Patient reports Barretts  . Bulging lumbar disc   . High cholesterol   . Hypothyroidism   . Nerve damage   . Osteopenia   . Pelvic pain in female    due to bladder sling   Past Surgical History:  Procedure Laterality Date  . APPENDECTOMY  1960  . BLADDER SUSPENSION    . ESOPHAGOGASTRODUODENOSCOPY N/A 12/21/2014   Procedure: ESOPHAGOGASTRODUODENOSCOPY (EGD);  Surgeon: Rogene Houston, MD;  [facility];  Service: Endoscopy;  Laterality: N/A;  210  . ESOPHAGOGASTRODUODENOSCOPY N/A 06/16/2015   Procedure: ESOPHAGOGASTRODUODENOSCOPY (EGD);  Surgeon: Rogene Houston, MD;  Location: [facility];  Service: Endoscopy;  Laterality: N/A;  1250   Current Outpatient Prescriptions on File Prior to Visit  Medication Sig Dispense Refill  . acetaminophen (TYLENOL) 500 MG tablet Take 1,000 mg by mouth every 6 (six) hours as needed.    Marland Kitchen amitriptyline (ELAVIL) 25 MG tablet Take 25 mg by mouth at bedtime.     Marland Kitchen aspirin 81 MG tablet Take 81 mg by mouth daily.    . calcium carbonate (OS-CAL) 600 MG TABS tablet Take 600 mg by mouth 2 (two) times daily with a meal.    . gabapentin (NEURONTIN)  300 MG capsule Take 2 capsules (600 mg total) by mouth 3 (three) times daily. 540 capsule 4  . levothyroxine (SYNTHROID, LEVOTHROID) 75 MCG tablet take 1 tablet by mouth every morning ON AN EMPTY STOMACH 30 tablet 0  . Multiple Vitamin (MULTIVITAMIN) tablet Take 1 tablet by mouth daily.    . simvastatin (ZOCOR) 40 MG tablet Take 1 tablet (40 mg total) by mouth daily. 30 tablet 0  . pantoprazole (PROTONIX) 40 MG tablet TAKE ONE TABLET BY MOUTH ONCE DAILY (Patient not taking: Reported on 09/24/2016) 30 tablet 3   No current facility-administered medications on file prior to visit.    No Known Allergies Social History   Social History  . Marital status: Divorced    Spouse name: N/A  . Number of children: N/A  . Years of education: N/A   Occupational History  . Not on file.   Social History Main Topics  . Smoking status: Never Smoker  . Smokeless tobacco: Never Used  . Alcohol use No  . Drug use: No  . Sexual activity: No   Other Topics Concern  . Not on file   Social History Narrative  . No narrative on file      Review of Systems  All other systems reviewed and are negative.      Objective:   Physical Exam  Constitutional: She appears well-developed and well-nourished.  Cardiovascular: Normal rate, regular rhythm and  normal heart sounds.   Pulmonary/Chest: Effort normal and breath sounds normal. No respiratory distress. She has no wheezes. She has no rales.  Abdominal: Soft. Bowel sounds are normal. She exhibits no distension. There is no tenderness.   Bilateral cerumen impactions.         Assessment & Plan:  Hypokalemia, dysuria, cerumen impaction  Cerumen impactions were removed with irrigation without difficulty. I will perform a urinalysis to evaluate for urinary tract infection. I will recheck the patient's BMP to follow-up on her hypokalemia.  I recommended that the patient resume her pantoprazole to deal with her daily acid reflux. She does have trace blood  and trace leukocyte esterase in her urine. That coupled with the symptoms she is having suggests urinary tract infection. I will treat the patient with Bactrim double strength tablets by mouth twice a day for 3 days and send the urine culture

## 2016-09-24 NOTE — Addendum Note (Signed)
Addended by: Shary Decamp B on: 09/24/2016 04:22 PM   Modules accepted: Orders

## 2016-09-25 LAB — BASIC METABOLIC PANEL
BUN: 10 mg/dL (ref 7–25)
CALCIUM: 9.6 mg/dL (ref 8.6–10.4)
CO2: 32 mmol/L — ABNORMAL HIGH (ref 20–31)
CREATININE: 0.69 mg/dL (ref 0.60–0.93)
Chloride: 102 mmol/L (ref 98–110)
GLUCOSE: 84 mg/dL (ref 70–99)
Potassium: 3.5 mmol/L (ref 3.5–5.3)
Sodium: 142 mmol/L (ref 135–146)

## 2016-09-26 LAB — URINE CULTURE: ORGANISM ID, BACTERIA: NO GROWTH

## 2016-10-09 ENCOUNTER — Ambulatory Visit (INDEPENDENT_AMBULATORY_CARE_PROVIDER_SITE_OTHER): Payer: PPO | Admitting: Family Medicine

## 2016-10-09 ENCOUNTER — Encounter: Payer: Self-pay | Admitting: Family Medicine

## 2016-10-09 VITALS — BP 132/84 | HR 82 | Temp 98.2°F | Resp 16 | Ht 62.0 in | Wt 130.0 lb

## 2016-10-09 DIAGNOSIS — E039 Hypothyroidism, unspecified: Secondary | ICD-10-CM | POA: Diagnosis not present

## 2016-10-09 DIAGNOSIS — Z0000 Encounter for general adult medical examination without abnormal findings: Secondary | ICD-10-CM

## 2016-10-09 DIAGNOSIS — Z87440 Personal history of urinary (tract) infections: Secondary | ICD-10-CM | POA: Diagnosis not present

## 2016-10-09 DIAGNOSIS — K219 Gastro-esophageal reflux disease without esophagitis: Secondary | ICD-10-CM | POA: Diagnosis not present

## 2016-10-09 DIAGNOSIS — R296 Repeated falls: Secondary | ICD-10-CM | POA: Diagnosis not present

## 2016-10-09 DIAGNOSIS — E7800 Pure hypercholesterolemia, unspecified: Secondary | ICD-10-CM | POA: Diagnosis not present

## 2016-10-09 LAB — URINALYSIS, ROUTINE W REFLEX MICROSCOPIC
BILIRUBIN URINE: NEGATIVE
GLUCOSE, UA: NEGATIVE
Hgb urine dipstick: NEGATIVE
Ketones, ur: NEGATIVE
LEUKOCYTES UA: NEGATIVE
Nitrite: NEGATIVE
PH: 6 (ref 5.0–8.0)
Protein, ur: NEGATIVE
Specific Gravity, Urine: <1.005 (ref 1.001–1.035)

## 2016-10-09 NOTE — Progress Notes (Signed)
Subjective:    Patient ID: Terry Allen, female    DOB: February 17, 1941, 76 y.o.   MRN: [id number]  HPI Here for CPE.  Mammogram was normal in 06/2016.   Was found to have osteopenia on DEXA in 10/2015.   Patient's records are out of date. She insists that she had a flu shot, the shingles vaccine, and a pneumonia shot performed at [facility] within the last year. Therefore she would only be due for a booster on Prevnar 13. She would like to get the records from [facility] to pin this down prior to receiving any vaccines. She states that she's had a colonoscopy as well as a sigmoidoscopy performed within the last 10 years but she's not certain. She does have a history of Barrett's esophagus and recently had an EGD performed by her gastroenterologist in [place]. Based on her age she does not require a Pap smear. Reviewing her past medical records, she has suffered several falls frequently. The patient states that she will bend over and lose her balance easily and topical to the floor. I am concerned that this will lead ultimately to a fracture. She's had a CT scan of the brain that was negative for infarct. Emergency room evaluation lab work as well as physical exam are unrevealing. Therefore I believe she is falling due to deconditioning and weakness in her legs. She would be interested in physical therapy referral   Past Medical History:  Diagnosis Date  . Acid reflux    Patient reports Barretts  . Bulging lumbar disc   . High cholesterol   . Hypothyroidism   . Nerve damage   . Osteopenia   . Pelvic pain in female    due to bladder sling   Past Surgical History:  Procedure Laterality Date  . APPENDECTOMY  1960  . BLADDER SUSPENSION    . ESOPHAGOGASTRODUODENOSCOPY N/A 12/21/2014   Procedure: ESOPHAGOGASTRODUODENOSCOPY (EGD);  Surgeon: Rogene Houston, MD;  [facility];  Service: Endoscopy;  Laterality: N/A;  210  . ESOPHAGOGASTRODUODENOSCOPY N/A 06/16/2015   Procedure:  ESOPHAGOGASTRODUODENOSCOPY (EGD);  Surgeon: Rogene Houston, MD;  [facility];  Service: Endoscopy;  Laterality: N/A;  1250   Current Outpatient Prescriptions on File Prior to Visit  Medication Sig Dispense Refill  . acetaminophen (TYLENOL) 500 MG tablet Take 1,000 mg by mouth every 6 (six) hours as needed.    Marland Kitchen amitriptyline (ELAVIL) 25 MG tablet Take 25 mg by mouth at bedtime.     Marland Kitchen aspirin 81 MG tablet Take 81 mg by mouth daily.    . calcium carbonate (OS-CAL) 600 MG TABS tablet Take 600 mg by mouth 2 (two) times daily with a meal.    . gabapentin (NEURONTIN) 300 MG capsule Take 2 capsules (600 mg total) by mouth 3 (three) times daily. 540 capsule 4  . levothyroxine (SYNTHROID, LEVOTHROID) 75 MCG tablet take 1 tablet by mouth every morning ON AN EMPTY STOMACH 30 tablet 0  . Multiple Vitamin (MULTIVITAMIN) tablet Take 1 tablet by mouth daily.    . pantoprazole (PROTONIX) 40 MG tablet TAKE ONE TABLET BY MOUTH ONCE DAILY (Patient not taking: Reported on 09/24/2016) 30 tablet 3  . simvastatin (ZOCOR) 40 MG tablet Take 1 tablet (40 mg total) by mouth daily. 30 tablet 0  . sulfamethoxazole-trimethoprim (BACTRIM DS,SEPTRA DS) 800-160 MG tablet Take 1 tablet by mouth 2 (two) times daily. 6 tablet 0   No current facility-administered medications on file prior to visit.  No Known Allergies Social History   Social History  . Marital status: Divorced    Spouse name: N/A  . Number of children: N/A  . Years of education: N/A   Occupational History  . Not on file.   Social History Main Topics  . Smoking status: Never Smoker  . Smokeless tobacco: Never Used  . Alcohol use No  . Drug use: No  . Sexual activity: No   Other Topics Concern  . Not on file   Social History Narrative  . No narrative on file   Family History  Problem Relation Age of Onset  . Breast cancer Mother   . Heart disease Mother   . Diabetes Mother   . Cancer Mother     breast  . Stroke Mother   .  Hypertension Mother   . Lung cancer Father   . Stroke Father   . Cancer Father     oat cell cancer/lung  . Kidney cancer Brother   . Cancer Brother     renal cell cancer  . Healthy Brother   . Diabetes Son      Review of Systems  All other systems reviewed and are negative.      Objective:   Physical Exam  Constitutional: She is oriented to person, place, and time. She appears well-developed and well-nourished. No distress.  HENT:  Head: Normocephalic and atraumatic.  Right Ear: External ear normal.  Left Ear: External ear normal.  Nose: Nose normal.  Mouth/Throat: Oropharynx is clear and moist. No oropharyngeal exudate.  Eyes: Conjunctivae and EOM are normal. Pupils are equal, round, and reactive to light. Right eye exhibits no discharge. Left eye exhibits no discharge. No scleral icterus.  Neck: Normal range of motion. Neck supple. No JVD present. No tracheal deviation present. No thyromegaly present.  Cardiovascular: Normal rate, regular rhythm, normal heart sounds and intact distal pulses.  Exam reveals no gallop and no friction rub.   No murmur heard. Pulmonary/Chest: Effort normal and breath sounds normal. No stridor. No respiratory distress. She has no wheezes. She has no rales. She exhibits no tenderness.  Abdominal: Soft. Bowel sounds are normal. She exhibits no distension and no mass. There is no tenderness. There is no rebound and no guarding.  Musculoskeletal: Normal range of motion. She exhibits no edema, tenderness or deformity.  Lymphadenopathy:    She has no cervical adenopathy.  Neurological: She is alert and oriented to person, place, and time. She has normal reflexes. She displays normal reflexes. No cranial nerve deficit. She exhibits normal muscle tone. Coordination normal.  Skin: Skin is warm. No rash noted. She is not diaphoretic. No erythema. No pallor.  Psychiatric: She has a normal mood and affect. Her behavior is normal. Judgment and thought content  normal.  Vitals reviewed.         Assessment & Plan:  Routine general medical examination at a health care facility - Plan: Urinalysis, Routine w reflex microscopic  Gastroesophageal reflux disease without esophagitis  Hypothyroidism, unspecified type  Hx: UTI (urinary tract infection) - Plan: Urinalysis, Routine w reflex microscopic Repeat urinalysis shows clearing of urinary tract infection the patient just had. I would like her to return fasting for a CBC, CMP, fasting lipid panel, and a TSH. I want to get the patient's past medical records to determine when she is due for her next colonoscopy. She signed a release of information form for this today. Patient will go by [facility] and get a copy of her  shot record. Sounds like she is due for a booster on Prevnar 13. We will defer any immunizations until pressure. I will refer the patient for physical therapy. I'm concerned by the frequent falls that she is having.

## 2016-10-10 ENCOUNTER — Encounter: Payer: PPO | Admitting: Family Medicine

## 2016-10-10 ENCOUNTER — Other Ambulatory Visit: Payer: PPO

## 2016-10-10 DIAGNOSIS — K219 Gastro-esophageal reflux disease without esophagitis: Secondary | ICD-10-CM | POA: Diagnosis not present

## 2016-10-10 DIAGNOSIS — Z0000 Encounter for general adult medical examination without abnormal findings: Secondary | ICD-10-CM | POA: Diagnosis not present

## 2016-10-10 DIAGNOSIS — E7800 Pure hypercholesterolemia, unspecified: Secondary | ICD-10-CM | POA: Diagnosis not present

## 2016-10-10 DIAGNOSIS — E039 Hypothyroidism, unspecified: Secondary | ICD-10-CM | POA: Diagnosis not present

## 2016-10-10 LAB — COMPLETE METABOLIC PANEL WITH GFR
ALBUMIN: 4.2 g/dL (ref 3.6–5.1)
ALT: 14 U/L (ref 6–29)
AST: 25 U/L (ref 10–35)
Alkaline Phosphatase: 39 U/L (ref 33–130)
BUN: 10 mg/dL (ref 7–25)
CALCIUM: 10.1 mg/dL (ref 8.6–10.4)
CO2: 31 mmol/L (ref 20–31)
Chloride: 100 mmol/L (ref 98–110)
Creat: 0.84 mg/dL (ref 0.60–0.93)
GFR, EST AFRICAN AMERICAN: 79 mL/min (ref >=60)
GFR, EST NON AFRICAN AMERICAN: 69 mL/min (ref >=60)
GLUCOSE: 88 mg/dL (ref 70–99)
POTASSIUM: 3.2 mmol/L — AB (ref 3.5–5.3)
SODIUM: 145 mmol/L (ref 135–146)
Total Bilirubin: 1.1 mg/dL (ref 0.2–1.2)
Total Protein: 6.9 g/dL (ref 6.1–8.1)

## 2016-10-10 LAB — CBC WITH DIFFERENTIAL/PLATELET
BASOS ABS: 0 {cells}/uL (ref 0–200)
Basophils Relative: 0 %
EOS ABS: 282 {cells}/uL (ref 15–500)
EOS PCT: 6 %
HCT: 40 % (ref 35.0–45.0)
HEMOGLOBIN: 13.2 g/dL (ref 12.0–15.0)
LYMPHS ABS: 1786 {cells}/uL (ref 850–3900)
Lymphocytes Relative: 38 %
MCH: 31.1 pg (ref 27.0–33.0)
MCHC: 33 g/dL (ref 32.0–36.0)
MCV: 94.3 fL (ref 80.0–100.0)
MPV: 10 fL (ref 7.5–12.5)
Monocytes Absolute: 470 cells/uL (ref 200–950)
Monocytes Relative: 10 %
NEUTROS ABS: 2162 {cells}/uL (ref 1500–7800)
NEUTROS PCT: 46 %
Platelets: 219 10*3/uL (ref 140–400)
RBC: 4.24 MIL/uL (ref 3.80–5.10)
RDW: 14 % (ref 11.0–15.0)
WBC: 4.7 10*3/uL (ref 3.8–10.8)

## 2016-10-10 LAB — TSH: TSH: 9.38 mIU/L — ABNORMAL HIGH

## 2016-10-10 LAB — LIPID PANEL
CHOL/HDL RATIO: 2.7 ratio (ref <5.0)
CHOLESTEROL: 174 mg/dL (ref <200)
HDL: 65 mg/dL (ref >50)
LDL Cholesterol: 74 mg/dL (ref <100)
TRIGLYCERIDES: 177 mg/dL — AB (ref <150)
VLDL: 35 mg/dL — ABNORMAL HIGH (ref <30)

## 2016-10-15 ENCOUNTER — Encounter (HOSPITAL_COMMUNITY): Payer: Self-pay | Admitting: Physical Therapy

## 2016-10-15 ENCOUNTER — Ambulatory Visit (HOSPITAL_COMMUNITY): Payer: PPO | Attending: Family Medicine | Admitting: Physical Therapy

## 2016-10-15 DIAGNOSIS — M6281 Muscle weakness (generalized): Secondary | ICD-10-CM | POA: Insufficient documentation

## 2016-10-15 DIAGNOSIS — Z9181 History of falling: Secondary | ICD-10-CM | POA: Diagnosis not present

## 2016-10-15 DIAGNOSIS — R2681 Unsteadiness on feet: Secondary | ICD-10-CM | POA: Insufficient documentation

## 2016-10-15 NOTE — Therapy (Addendum)
[place], [place], [address] Phone: [phone]   Fax:  [phone]  Physical Therapy Evaluation  Patient Details  Name: Terry Allen MRN: [id number] Date of Birth: [birth year] Referring Provider: Jenna Luo, MD  Encounter Date: 10/15/2016      PT End of Session - 10/15/16 1549    Visit Number 1   Number of Visits 13   Date for PT Re-Evaluation 11/05/16   Authorization Type [employer]   Authorization Time Period 10/15/16 to 11/26/16   PT Start Time 1301   PT Stop Time 1345   PT Time Calculation (min) 44 min   Activity Tolerance Patient tolerated treatment well;No increased pain   Behavior During Therapy WFL for tasks assessed/performed      Past Medical History:  Diagnosis Date  . Acid reflux    Patient reports Barretts  . Bulging lumbar disc   . High cholesterol   . Hypothyroidism   . Nerve damage   . Osteopenia   . Pelvic pain in female    due to bladder sling    Past Surgical History:  Procedure Laterality Date  . APPENDECTOMY  1960  . BLADDER SUSPENSION    . ESOPHAGOGASTRODUODENOSCOPY N/A 12/21/2014   Procedure: ESOPHAGOGASTRODUODENOSCOPY (EGD);  Surgeon: Rogene Houston, MD;  [facility];  Service: Endoscopy;  Laterality: N/A;  210  . ESOPHAGOGASTRODUODENOSCOPY N/A 06/16/2015   Procedure: ESOPHAGOGASTRODUODENOSCOPY (EGD);  Surgeon: Rogene Houston, MD;  [facility];  Service: Endoscopy;  Laterality: N/A;  1250    There were no vitals filed for this visit.       Subjective Assessment - 10/15/16 1303    Subjective Pt reports going to her PCP who referred her to PT due to increasing falls over the past year. She feels that she may fall ~1x each month. She has recently tried to use a [facility] because she is afraid she might break a hip.    Pertinent History acid reflux, high cholesterol, osteopenia.   Limitations Walking   Patient Stated Goals improve her  balance    Currently in Pain? No/denies            [facility] PT Assessment - 10/15/16 0001      Assessment   Medical Diagnosis Multiple Falls   Referring Provider Jenna Luo, MD   Onset Date/Surgical Date --  ~1 year ago    Next MD Visit none as of now    Prior Therapy none     Precautions   Precautions None     Balance Screen   Has the patient fallen in the past 6 months Yes   How many times? 6-7 times   Has the patient had a decrease in activity level because of a fear of falling?  No   Is the patient reluctant to leave their home because of a fear of falling?  No     Home Environment   Living Environment Private residence   Additional [place] and just bought a house which she is trying to fix up      Prior Function   Level of Independence Independent     Cognition   Overall Cognitive Status Within Functional Limits for tasks assessed     ROM / Strength   AROM / PROM / Strength Strength     Strength   Strength Assessment Site Hip;Knee;Ankle   Right/Left Hip Right;Left   Right Hip Flexion  5/5   Right Hip Extension 4/5   Right Hip ABduction 4/5   Left Hip Flexion 5/5   Left Hip Extension 4/5   Left Hip ABduction 4/5   Right/Left Knee Right;Left   Right Knee Flexion 4/5   Right Knee Extension 5/5   Left Knee Flexion 4/5   Left Knee Extension 5/5   Right/Left Ankle Right;Left   Right Ankle Dorsiflexion 4/5   Right Ankle Plantar Flexion 4/5   Left Ankle Dorsiflexion 4/5     Transfers   Five time sit to stand comments  16.3, no UE support     Standardized Balance Assessment   Standardized Balance Assessment Berg Balance Test     Berg Balance Test   Sit to Stand Able to stand  independently using hands   Standing Unsupported Able to stand safely 2 minutes   Sitting with Back Unsupported but Feet Supported on Floor or Stool Able to sit safely and securely 2 minutes   Stand to Sit Sits safely with minimal use of hands   Transfers Able to  transfer safely, definite need of hands   Standing Unsupported with Eyes Closed Able to stand 10 seconds with supervision   Standing Ubsupported with Feet Together Able to place feet together independently and stand for 1 minute with supervision   From Standing, Reach Forward with Outstretched Arm Can reach confidently >25 cm (10")   From Standing Position, Pick up Object from Floor Able to pick up shoe safely and easily   From Standing Position, Turn to Look Behind Over each Shoulder Needs supervision when turning  1 LOB requiring assistance to recover   Turn 360 Degrees Able to turn 360 degrees safely in 4 seconds or less   Standing Unsupported, Alternately Place Feet on Step/Stool Able to stand independently and safely and complete 8 steps in 20 seconds   Standing Unsupported, One Foot in Front Able to take small step independently and hold 30 seconds  Lt 9 sec, Rt 18 sec    Standing on One Leg Able to lift leg independently and hold 5-10 seconds  Lt 6 sec , Rt 9 sec    Total Score 46                   [facility] Adult PT Treatment/Exercise - 10/15/16 0001      Exercises   Exercises Knee/Hip     Knee/Hip Exercises: Standing   Other Standing Knee Exercises gastroc stretch standing against wall, x20 sec each      Knee/Hip Exercises: Supine   Bridges Both;1 set;5 reps                PT Education - 10/15/16 1548    Education provided Yes   Education Details eval findings/POC; HEP initiated; importance of avoiding prolonged sleeping in the recliner   Person(s) Educated Patient   Methods Explanation;Handout;Verbal cues   Comprehension Verbalized understanding;Returned demonstration          PT Short Term Goals - 10/15/16 1611      PT SHORT TERM GOAL #1   Title Pt will demo consistency and independence with her HEP to improve strength and balance.    Time 2   Period Weeks   Status New     PT SHORT TERM GOAL #2   Title Pt will demo improved ankle DF AROM to  atleast 10 deg, to aid in balance reactions.    Time 3   Period Weeks   Status New  PT Long Term Goals - 10-18-16 1613      PT LONG TERM GOAL #1   Title Pt will demo improved BLE strength to atleast 4+/5 MMT which will increase her safety with functional activity.    Time 6   Period Weeks   Status New     PT LONG TERM GOAL #2   Title Pt will score atleast an 8 point improvement in her Berg balance test, to indicate a signficant improvement in her balance.    Time 6   Period Weeks   Status New     PT LONG TERM GOAL #3   Title Pt will perform 5x sit to stand in atleast 12 sec without UE support, to demonstrate an improvement in functional strength and power.   Time 6   Period Weeks   Status New     PT LONG TERM GOAL #4   Title Pt will demo consistency and understanding of her advanced HEP to prepare for discharge home.    Time 6   Period Weeks   Status New               Plan - Oct 18, 2016 1552    Clinical Impression Statement Pt is a pleasant 76yo F referred to OPPT with history of multiple falls, stating she has fallen atleast 7 times in the past several months. She presents today with limitations in BLE strength, ankle ROM and proprioception limiting her balance and functional strength. She scored a 46/56 on the Berg balance test, placing her at a moderate risk of falls. Therapist discussed eval findings/POC and reviewed her HEP at this time and she was able to return demonstration of proper technique. She would benefit from skilled PT to address her limitations in strength/balance/mobility to improve her safety with daily activity and decrease her risk of falls and injury to herself.    PT Treatment/Interventions Vestibular;Passive range of motion;Manual techniques;Patient/family education;Neuromuscular re-education;Balance training;Therapeutic exercise;Therapeutic activities;Functional mobility training;Stair training;Gait training;ADLs/Self Care Home Management    PT Next Visit Plan hip abductor/extensor strengthening; ankle 4 way strengthening; introduce static balance activity.    PT Home Exercise Plan supine bridge x10 reps, gastroc stretch 3x30 sec each   Recommended Other Services none    Consulted and Agree with Plan of Care Patient    PT frequency: 2x/week for 6 weeks   Patient will benefit from skilled therapeutic intervention in order to improve the following deficits and impairments:    Abnormal gait; Decreased activity tolerance; Decreased strength; Impaired flexibility; Pain; Postural dysfunction; Improper body mechanics; Decreased range of motion; Decreased balance; Decreased mobility; Increased muscle spasms; Hypomobility  Visit Diagnosis: Unsteadiness on feet  History of falling  Muscle weakness (generalized)      G-Codes - 2016-10-18 1620    Functional Assessment Tool Used Clinical judgement based on assessment of ROM, strength, mobility and balance   Functional Limitation Mobility: Walking and moving around   Mobility: Walking and Moving Around Current Status [phone]) At least 40 percent but less than 60 percent impaired, limited or restricted   Mobility: Walking and Moving Around Goal Status [phone]) At least 20 percent but less than 40 percent impaired, limited or restricted       Problem List Patient Active Problem List   Diagnosis Date Noted  . GERD (gastroesophageal reflux disease) 12/13/2014  . Barrett's esophagus 12/13/2014  . Hypothyroidism 12/13/2014  . High cholesterol 12/13/2014   4:22 PM,10/18/16 Elly Modena PT, DPT [facility] [place]  [facility] [address] [place], [place], [address] Phone: [phone]   Fax:  [phone]  Name: Terry Allen MRN: [id number] Date of Birth: [birth year]   *Addendum made to include PT frequency and pt benefits from skilled intervention. Also had to resend PT certification to referring  MD.  1:05 PM,10/28/16 Elly Modena PT, DPT [facility] [phone]

## 2016-10-17 ENCOUNTER — Ambulatory Visit (HOSPITAL_COMMUNITY): Payer: PPO | Admitting: Physical Therapy

## 2016-10-17 ENCOUNTER — Encounter (HOSPITAL_COMMUNITY): Payer: Self-pay | Admitting: Physical Therapy

## 2016-10-17 DIAGNOSIS — Z9181 History of falling: Secondary | ICD-10-CM

## 2016-10-17 DIAGNOSIS — R2681 Unsteadiness on feet: Secondary | ICD-10-CM

## 2016-10-17 DIAGNOSIS — M6281 Muscle weakness (generalized): Secondary | ICD-10-CM

## 2016-10-17 NOTE — Therapy (Signed)
[place], [place], [address] Phone: [phone]   Fax:  [phone]  Physical Therapy Treatment  Patient Details  Name: Terry Allen MRN: [id number] Date of Birth: March 08, 1941 Referring Provider: Jenna Luo, MD  Encounter Date: 10/17/2016      PT End of Session - 10/17/16 1342    Visit Number 2   Number of Visits 13   Date for PT Re-Evaluation 11/05/16   Authorization Type [employer]   Authorization Time Period 10/15/16 to 11/26/16   PT Start Time 1345   PT Stop Time 1430   PT Time Calculation (min) 45 min   Activity Tolerance Patient tolerated treatment well;No increased pain   Behavior During Therapy WFL for tasks assessed/performed      Past Medical History:  Diagnosis Date  . Acid reflux    Patient reports Barretts  . Bulging lumbar disc   . High cholesterol   . Hypothyroidism   . Nerve damage   . Osteopenia   . Pelvic pain in female    due to bladder sling    Past Surgical History:  Procedure Laterality Date  . APPENDECTOMY  1960  . BLADDER SUSPENSION    . ESOPHAGOGASTRODUODENOSCOPY N/A 12/21/2014   Procedure: ESOPHAGOGASTRODUODENOSCOPY (EGD);  Surgeon: Rogene Houston, MD;  [facility];  Service: Endoscopy;  Laterality: N/A;  210  . ESOPHAGOGASTRODUODENOSCOPY N/A 06/16/2015   Procedure: ESOPHAGOGASTRODUODENOSCOPY (EGD);  Surgeon: Rogene Houston, MD;  [facility];  Service: Endoscopy;  Laterality: N/A;  1250    There were no vitals filed for this visit.      Subjective Assessment - 10/17/16 1347    Subjective Pt states that her HEP went well. She denies any close falls since last session.   Pertinent History acid reflux, high cholesterol, osteopenia.   Limitations Walking   Patient Stated Goals improve her balance    Currently in Pain? Yes   Pain Score 4    Pain Location Back   Pain Orientation Lower   Pain Descriptors / Indicators Dull   Pain Type Chronic pain   Pain Frequency Intermittent                         [facility] Adult PT Treatment/Exercise - 10/17/16 0001      Therapeutic Activites    Therapeutic Activities --   Other Therapeutic Activities --     Exercises   Exercises Ankle     Knee/Hip Exercises: Standing   Hip Abduction Both;3 sets;10 reps  RTB   Hip Extension Both;3 sets;10 reps  RTB     Ankle Exercises: Seated   Other Seated Ankle Exercises 4-way ankle with RTB, 2x10 BLE             Balance Exercises - 10/17/16 1818      Balance Exercises: Standing   Standing Eyes Closed Foam/compliant surface;Narrow base of support (BOS);5 reps;10 secs   Tandem Stance Foam/compliant surface;Eyes open;3 reps;10 secs  bil staggered stance   Marching Limitations cone taps (3 cones) on firm surface x 5 each; cone taps on firm surface with dual task (thinking of female/female names) x 10 each   Other Standing Exercises staggered stance on firm surface (LLE forward) with OH raises with 2# weight bar x 10 (too easy) so progressed pt to performing with RLE elevated on airex x 10 OH raises with 2# weight bar; NBOS on airex circles in/out  with 2# weight bar x 10 each           PT Education - 10/17/16 1832    Education provided Yes   Education Details reviewed evaluation and goals; added ankle and hip strengthening to HEP   Person(s) Educated Patient   Methods Explanation;Handout;Demonstration   Comprehension Verbalized understanding;Returned demonstration          PT Short Term Goals - 10/15/16 1611      PT SHORT TERM GOAL #1   Title Pt will demo consistency and independence with her HEP to improve strength and balance.    Time 2   Period Weeks   Status New     PT SHORT TERM GOAL #2   Title Pt will demo improved ankle DF AROM to atleast 10 deg, to aid in balance reactions.    Time 3   Period Weeks   Status New           PT Long Term Goals - 10/15/16 1613      PT LONG TERM  GOAL #1   Title Pt will demo improved BLE strength to atleast 4+/5 MMT which will increase her safety with functional activity.    Time 6   Period Weeks   Status New     PT LONG TERM GOAL #2   Title Pt will score atleast an 8 point improvement in her Berg balance test, to indicate a signficant improvement in her balance.    Time 6   Period Weeks   Status New     PT LONG TERM GOAL #3   Title Pt will perform 5x sit to stand in atleast 12 sec without UE support, to demonstrate an improvement in functional strength and power.   Time 6   Period Weeks   Status New     PT LONG TERM GOAL #4   Title Pt will demo consistency and understanding of her advanced HEP to prepare for discharge home.    Time 6   Period Weeks   Status New               Plan - 10/17/16 1827    Clinical Impression Statement Pt making progress towards goals as evidenced by her performance during balance activities. She was able to tolerate balance acitivites on compliant surface but had some difficulty with EC on compliant surface. Pt given ankle and hip strengthening exercises this date and she tolerated those well. She was given updated HEP to maximize hip and ankle strength.   PT Treatment/Interventions Vestibular;Passive range of motion;Manual techniques;Patient/family education;Neuromuscular re-education;Balance training;Therapeutic exercise;Therapeutic activities;Functional mobility training;Stair training;Gait training;ADLs/Self Care Home Management   PT Next Visit Plan hip abductor/extensor strengthening; ankle 4 way strengthening; introduce static balance activity.    PT Home Exercise Plan supine bridge x10 reps, gastroc stretch 3x30 sec each   Consulted and Agree with Plan of Care Patient      Patient will benefit from skilled therapeutic intervention in order to improve the following deficits and impairments:     Visit Diagnosis: Unsteadiness on feet  History of falling  Muscle weakness  (generalized)     Problem List Patient Active Problem List   Diagnosis Date Noted  . GERD (gastroesophageal reflux disease) 12/13/2014  . Barrett's esophagus 12/13/2014  . Hypothyroidism 12/13/2014  . High cholesterol 12/13/2014   Geraldine Solar PT, DPT  [place] [address] [place], [place], [address] Phone: [phone]   Fax:  [phone]  Name:  Terry Allen MRN: [id number] Date of Birth: 15-May-1941

## 2016-10-17 NOTE — Patient Instructions (Addendum)
  Ankle 4way with TB  All theraband exercise is slow and controlled. Do not let the band "bounce" back. A. Plantarflexion: "gas pedal." Keep knee straight.Band around "ball of foot" and press it away as far as possible and slowly return to neutral. Repeat. B. Dorsiflexion: start in neutral and pull theraband back toward you as far as possible. pause. return slowly. keep knee straight. C.Inversion: start neutral and bring band toward your midline without bending or twisting knee. D. Eversion: start neutral and press band out without bending or twisting knee.  Perform 2-3 sets of 10 reps, 1x/day with the red theraband    LOOPED ELASTIC BAND HIP ABDUCTION  Perform at your kitchen sick with a chair behind you just in case you need to sit down.  While standing with an elastic band looped around your ankles, move the target leg out to the side as shown.   Perform 2-3 sets of 10, perform every other day   LOOPED ELASTIC BAND HIP EXTENSION  While standing with an elastic band looped around your ankles, move the target leg back as shown.   Keep your knees straight the entire time.   Perform 2-3 sets of 10, perform every other day

## 2016-10-21 ENCOUNTER — Encounter: Payer: Self-pay | Admitting: Family Medicine

## 2016-10-21 ENCOUNTER — Other Ambulatory Visit: Payer: Self-pay | Admitting: Family Medicine

## 2016-10-21 MED ORDER — LEVOTHYROXINE SODIUM 112 MCG PO TABS: 112.0000 ug | ORAL_TABLET | Freq: Every day | ORAL | 3 refills | Status: DC

## 2016-10-23 ENCOUNTER — Ambulatory Visit (HOSPITAL_COMMUNITY): Payer: PPO

## 2016-10-23 DIAGNOSIS — R2681 Unsteadiness on feet: Secondary | ICD-10-CM | POA: Diagnosis not present

## 2016-10-23 DIAGNOSIS — Z9181 History of falling: Secondary | ICD-10-CM

## 2016-10-23 DIAGNOSIS — M6281 Muscle weakness (generalized): Secondary | ICD-10-CM

## 2016-10-23 NOTE — Therapy (Signed)
[place], [place], [address] Phone: [phone]   Fax:  [phone]  Physical Therapy Treatment  Patient Details  Name: Terry Allen MRN: [id number] Date of Birth: [birth year] Referring Provider: Jenna Luo, MD  Encounter Date: 10/23/2016      PT End of Session - 10/23/16 1304    Visit Number 3   Number of Visits 13   Date for PT Re-Evaluation 11/05/16   Authorization Type [employer]   Authorization Time Period 10/15/16 to 11/26/16   PT Start Time 1300   PT Stop Time 1345   PT Time Calculation (min) 45 min   Activity Tolerance Patient tolerated treatment well;No increased pain   Behavior During Therapy WFL for tasks assessed/performed      Past Medical History:  Diagnosis Date  . Acid reflux    Patient reports Barretts  . Bulging lumbar disc   . High cholesterol   . Hypothyroidism   . Nerve damage   . Osteopenia   . Pelvic pain in female    due to bladder sling    Past Surgical History:  Procedure Laterality Date  . APPENDECTOMY  1960  . BLADDER SUSPENSION    . ESOPHAGOGASTRODUODENOSCOPY N/A 12/21/2014   Procedure: ESOPHAGOGASTRODUODENOSCOPY (EGD);  Surgeon: Rogene Houston, MD;  [facility];  Service: Endoscopy;  Laterality: N/A;  210  . ESOPHAGOGASTRODUODENOSCOPY N/A 06/16/2015   Procedure: ESOPHAGOGASTRODUODENOSCOPY (EGD);  Surgeon: Rogene Houston, MD;  [facility];  Service: Endoscopy;  Laterality: N/A;  1250    There were no vitals filed for this visit.      Subjective Assessment - 10/23/16 1303    Subjective Pt reports compliance with HEP, a little bit of productive soreness.  No reports of pain today.     Pertinent History acid reflux, high cholesterol, osteopenia.   Patient Stated Goals improve her balance    Currently in Pain? No/denies             [facility] Adult PT Treatment/Exercise - 10/23/16 0001      Knee/Hip Exercises: Standing    Heel Raises 15 reps   Heel Raises Limitations toe raises   Hip Abduction 15 reps;Knee straight  RTB   Hip Extension 15 reps;Knee straight             Balance Exercises - 10/23/16 1331      Balance Exercises: Standing   Tandem Stance Foam/compliant surface;Eyes open;3 reps;10 secs   SLS 3 reps  Rt 16", LT 13"   Marching Limitations cone taps (6 cones) on firm surface with dual task (thinking of female/female names) x 10 each             PT Short Term Goals - 10/15/16 1611      PT SHORT TERM GOAL #1   Title Pt will demo consistency and independence with her HEP to improve strength and balance.    Time 2   Period Weeks   Status New     PT SHORT TERM GOAL #2   Title Pt will demo improved ankle DF AROM to atleast 10 deg, to aid in balance reactions.    Time 3   Period Weeks   Status New           PT Long Term Goals - 10/15/16 1613      PT LONG TERM GOAL #1   Title Pt will demo improved BLE strength to atleast 4+/5 MMT which  will increase her safety with functional activity.    Time 6   Period Weeks   Status New     PT LONG TERM GOAL #2   Title Pt will score atleast an 8 point improvement in her Berg balance test, to indicate a signficant improvement in her balance.    Time 6   Period Weeks   Status New     PT LONG TERM GOAL #3   Title Pt will perform 5x sit to stand in atleast 12 sec without UE support, to demonstrate an improvement in functional strength and power.   Time 6   Period Weeks   Status New     PT LONG TERM GOAL #4   Title Pt will demo consistency and understanding of her advanced HEP to prepare for discharge home.    Time 6   Period Weeks   Status New               Plan - 10/23/16 1539    Clinical Impression Statement Session focus on improving LE strengthening and static balance activaities.  Added gastroc strengthening therex to improve gait mechanics and continued with theraband strengthening 4way ankle and hip  strengtheing.  Min cueing for proper form to improve activation of proper mm.  No reports of pain through session, was limited by fatigue.     PT Treatment/Interventions Vestibular;Passive range of motion;Manual techniques;Patient/family education;Neuromuscular re-education;Balance training;Therapeutic exercise;Therapeutic activities;Functional mobility training;Stair training;Gait training;ADLs/Self Care Home Management   PT Next Visit Plan Begin sidelying abduction/ extensor strengthening next session.  continue with ankle 4way strengthening and static balance activity.   PT Home Exercise Plan supine bridge x10 reps, gastroc stretch 3x30 sec each      Patient will benefit from skilled therapeutic intervention in order to improve the following deficits and impairments:     Visit Diagnosis: Unsteadiness on feet  History of falling  Muscle weakness (generalized)     Problem List Patient Active Problem List   Diagnosis Date Noted  . GERD (gastroesophageal reflux disease) 12/13/2014  . Barrett's esophagus 12/13/2014  . Hypothyroidism 12/13/2014  . High cholesterol 12/13/2014   Ihor Austin, LPTA; [place]  Aldona Lento 10/23/2016, 3:50 PM  [place], [place], [address] Phone: [phone]   Fax:  [phone]  Name: Terry Allen MRN: [id number] Date of Birth: 05/24/1941

## 2016-10-25 ENCOUNTER — Ambulatory Visit (HOSPITAL_COMMUNITY): Payer: PPO

## 2016-10-25 DIAGNOSIS — M6281 Muscle weakness (generalized): Secondary | ICD-10-CM

## 2016-10-25 DIAGNOSIS — R2681 Unsteadiness on feet: Secondary | ICD-10-CM

## 2016-10-25 DIAGNOSIS — Z9181 History of falling: Secondary | ICD-10-CM

## 2016-10-25 NOTE — Patient Instructions (Addendum)
Abduction    Lift leg up toward ceiling. Return. Repeat 10 times each leg. Do 2 sessions per day.  http://gt2.exer.us/386   Copyright  VHI. All rights reserved.   Extension    Lift leg up in the air and bring it back down. Repeat with other leg. Repeat 10 times. Do 2 sessions per day.  http://gt2.exer.us/388   Copyright  VHI. All rights reserved.   Single Leg Balance: Eyes Open    Stand on right leg with eyes open.  3-5 reps  per day.  http://ggbe.exer.us/5   Copyright  VHI. All rights reserved.

## 2016-10-25 NOTE — Therapy (Signed)
[place], [place], [address] Phone: [phone]   Fax:  [phone]  Physical Therapy Treatment  Patient Details  Name: Terry Allen MRN: [id number] Date of Birth: 1941/03/02 Referring Provider: Jenna Luo, MD  Encounter Date: 10/25/2016      PT End of Session - 10/25/16 1038    Visit Number 4   Number of Visits 13   Date for PT Re-Evaluation 11/05/16   Authorization Type [employer]   Authorization Time Period 10/15/16 to 11/26/16   PT Start Time 1032   PT Stop Time 1116   PT Time Calculation (min) 44 min   Equipment Utilized During Treatment Gait belt   Activity Tolerance Patient tolerated treatment well;No increased pain   Behavior During Therapy WFL for tasks assessed/performed      Past Medical History:  Diagnosis Date  . Acid reflux    Patient reports Barretts  . Bulging lumbar disc   . High cholesterol   . Hypothyroidism   . Nerve damage   . Osteopenia   . Pelvic pain in female    due to bladder sling    Past Surgical History:  Procedure Laterality Date  . APPENDECTOMY  1960  . BLADDER SUSPENSION    . ESOPHAGOGASTRODUODENOSCOPY N/A 12/21/2014   Procedure: ESOPHAGOGASTRODUODENOSCOPY (EGD);  Surgeon: Rogene Houston, MD;  [facility];  Service: Endoscopy;  Laterality: N/A;  210  . ESOPHAGOGASTRODUODENOSCOPY N/A 06/16/2015   Procedure: ESOPHAGOGASTRODUODENOSCOPY (EGD);  Surgeon: Rogene Houston, MD;  [facility];  Service: Endoscopy;  Laterality: N/A;  1250    There were no vitals filed for this visit.      Subjective Assessment - 10/25/16 1035    Subjective Pt stated compliance wiht HEP and reports increased ease with tandem stance.  No reports of pain today.  Reports she worked out in yard bending over [place] yesterday and is stiff in back, no pain today.     Pertinent History acid reflux, high cholesterol, osteopenia.   Patient Stated Goals  improve her balance    Currently in Pain? No/denies                         [facility] Adult PT Treatment/Exercise - 10/25/16 0001      Knee/Hip Exercises: Standing   Heel Raises 15 reps   Heel Raises Limitations toe raises     Knee/Hip Exercises: Seated   Sit to Sand 10 reps;without UE support     Knee/Hip Exercises: Sidelying   Hip ABduction Both;10 reps     Knee/Hip Exercises: Prone   Hip Extension Both;10 reps             Balance Exercises - 10/25/16 1100      Balance Exercises: Standing   Tandem Stance Foam/compliant surface;Eyes open;3 reps;10 secs   SLS 3 reps  Rt 18", Lt 33" max of 3   SLS with Vectors 2 reps;Intermittent upper extremity assist  vector stance 2x5" bil LE with intermittent HHA   Marching Limitations cone taps (6 cones) on firm surface with dual task (thinking of female/female names) x 10 each             PT Short Term Goals - 10/15/16 1611      PT SHORT TERM GOAL #1   Title Pt will demo consistency and independence with her HEP to improve strength and balance.    Time 2  Period Weeks   Status New     PT SHORT TERM GOAL #2   Title Pt will demo improved ankle DF AROM to atleast 10 deg, to aid in balance reactions.    Time 3   Period Weeks   Status New           PT Long Term Goals - 10/15/16 1613      PT LONG TERM GOAL #1   Title Pt will demo improved BLE strength to atleast 4+/5 MMT which will increase her safety with functional activity.    Time 6   Period Weeks   Status New     PT LONG TERM GOAL #2   Title Pt will score atleast an 8 point improvement in her Berg balance test, to indicate a signficant improvement in her balance.    Time 6   Period Weeks   Status New     PT LONG TERM GOAL #3   Title Pt will perform 5x sit to stand in atleast 12 sec without UE support, to demonstrate an improvement in functional strength and power.   Time 6   Period Weeks   Status New     PT LONG TERM GOAL #4   Title Pt  will demo consistency and understanding of her advanced HEP to prepare for discharge home.    Time 6   Period Weeks   Status New               Plan - 10/25/16 1215    Clinical Impression Statement Added proximal hip exercises to POC for LE strenghtening with min cueing for form and appropriate hold times.  Continued session focus on LE strenghtening and static balance activities.  Pt improving stability with tandem stance on stable surfaces and improve SLS BLE.  Progressed to vector stance for hip strengthening with SLS position.  EOS pt limited by fatigue, no reports of pain through session.  Added hip abduction, extension and SLS to HEP.     PT Treatment/Interventions Vestibular;Passive range of motion;Manual techniques;Patient/family education;Neuromuscular re-education;Balance training;Therapeutic exercise;Therapeutic activities;Functional mobility training;Stair training;Gait training;ADLs/Self Care Home Management   PT Next Visit Plan Continue to strengthening hip musculature, ankle strengthening with theraband and progress static balance activities.     PT Home Exercise Plan supine bridge x10 reps, gastroc stretch 3x30 sec each; 10/25/2016 sidelying abd, prone hip extension and SLS       Patient will benefit from skilled therapeutic intervention in order to improve the following deficits and impairments:     Visit Diagnosis: Unsteadiness on feet  History of falling  Muscle weakness (generalized)     Problem List Patient Active Problem List   Diagnosis Date Noted  . GERD (gastroesophageal reflux disease) 12/13/2014  . Barrett's esophagus 12/13/2014  . Hypothyroidism 12/13/2014  . High cholesterol 12/13/2014   Ihor Austin, LPTA; [place]  Aldona Lento 10/25/2016, 12:20 PM  [place], [place], [address] Phone: [phone]   Fax:  [phone]  Name: Terry Allen MRN:  [id number] Date of Birth: 04-02-1941

## 2016-10-28 NOTE — Addendum Note (Signed)
Addended by: Elly Modena E on: 10/28/2016 01:05 PM   Modules accepted: Orders

## 2016-10-29 ENCOUNTER — Ambulatory Visit (HOSPITAL_COMMUNITY): Payer: PPO | Admitting: Physical Therapy

## 2016-10-29 DIAGNOSIS — M6281 Muscle weakness (generalized): Secondary | ICD-10-CM

## 2016-10-29 DIAGNOSIS — R2681 Unsteadiness on feet: Secondary | ICD-10-CM

## 2016-10-29 DIAGNOSIS — Z9181 History of falling: Secondary | ICD-10-CM

## 2016-10-29 NOTE — Therapy (Signed)
[place], [place], [address] Phone: [phone]   Fax:  [phone]  Physical Therapy Treatment  Patient Details  Name: Terry Allen MRN: [id number] Date of Birth: 1941/08/30 Referring Provider: Jenna Luo, MD  Encounter Date: 10/29/2016      PT End of Session - 10/29/16 1426    Visit Number 5   Number of Visits 13   Date for PT Re-Evaluation 11/05/16   Authorization Type [employer]   Authorization Time Period 10/15/16 to 11/26/16   PT Start Time 1650   PT Stop Time 1732   PT Time Calculation (min) 42 min   Equipment Utilized During Treatment Gait belt   Activity Tolerance Patient tolerated treatment well;No increased pain   Behavior During Therapy WFL for tasks assessed/performed      Past Medical History:  Diagnosis Date  . Acid reflux    Patient reports Barretts  . Bulging lumbar disc   . High cholesterol   . Hypothyroidism   . Nerve damage   . Osteopenia   . Pelvic pain in female    due to bladder sling    Past Surgical History:  Procedure Laterality Date  . APPENDECTOMY  1960  . BLADDER SUSPENSION    . ESOPHAGOGASTRODUODENOSCOPY N/A 12/21/2014   Procedure: ESOPHAGOGASTRODUODENOSCOPY (EGD);  Surgeon: Rogene Houston, MD;  [facility];  Service: Endoscopy;  Laterality: N/A;  210  . ESOPHAGOGASTRODUODENOSCOPY N/A 06/16/2015   Procedure: ESOPHAGOGASTRODUODENOSCOPY (EGD);  Surgeon: Rogene Houston, MD;  [facility];  Service: Endoscopy;  Laterality: N/A;  1250    There were no vitals filed for this visit.      Subjective Assessment - 10/29/16 1354    Subjective Pt states she is currently having no pain.  STates she is weaker in her Lt LE than her Rt.   Currently in Pain? No/denies                         [facility] Adult PT Treatment/Exercise - 10/29/16 0001      Knee/Hip Exercises: Standing   Heel Raises 20 reps   Heel Raises  Limitations toe raises 20 reps   Hip Abduction 20 reps;Both   Hip Extension 20 reps;Both             Balance Exercises - 10/29/16 1401      Balance Exercises: Standing   SLS 30 secs;Eyes open   SLS with Vectors 5 reps;Upper extremity assist 1  5 second holds   Gait with Head Turns 2 reps   Tandem Gait 2 reps   Retro Gait 2 reps   Sidestepping 2 reps  with red theraband             PT Short Term Goals - 10/15/16 1611      PT SHORT TERM GOAL #1   Title Pt will demo consistency and independence with her HEP to improve strength and balance.    Time 2   Period Weeks   Status New     PT SHORT TERM GOAL #2   Title Pt will demo improved ankle DF AROM to atleast 10 deg, to aid in balance reactions.    Time 3   Period Weeks   Status New           PT Long Term Goals - 10/15/16 1613      PT LONG TERM GOAL #1   Title Pt  will demo improved BLE strength to atleast 4+/5 MMT which will increase her safety with functional activity.    Time 6   Period Weeks   Status New     PT LONG TERM GOAL #2   Title Pt will score atleast an 8 point improvement in her Berg balance test, to indicate a signficant improvement in her balance.    Time 6   Period Weeks   Status New     PT LONG TERM GOAL #3   Title Pt will perform 5x sit to stand in atleast 12 sec without UE support, to demonstrate an improvement in functional strength and power.   Time 6   Period Weeks   Status New     PT LONG TERM GOAL #4   Title Pt will demo consistency and understanding of her advanced HEP to prepare for discharge home.    Time 6   Period Weeks   Status New               Plan - 10/29/16 1435    Clinical Impression Statement Continued with main focus on LE strengthening and balance.  Able to increase reps of vector stance and progress to dynamic balance.  Pt with multiple LOB completing tandem and retro ambulation. min-mod assist needed to re-establish balance.  Cues needed for posture  and form while completing activities.     PT Treatment/Interventions Vestibular;Passive range of motion;Manual techniques;Patient/family education;Neuromuscular re-education;Balance training;Therapeutic exercise;Therapeutic activities;Functional mobility training;Stair training;Gait training;ADLs/Self Care Home Management   PT Next Visit Plan Continue to strengthening LE musculature and improve balance.       PT Home Exercise Plan supine bridge x10 reps, gastroc stretch 3x30 sec each; 10/25/2016 sidelying abd, prone hip extension and SLS       Patient will benefit from skilled therapeutic intervention in order to improve the following deficits and impairments:     Visit Diagnosis: Unsteadiness on feet  History of falling  Muscle weakness (generalized)     Problem List Patient Active Problem List   Diagnosis Date Noted  . GERD (gastroesophageal reflux disease) 12/13/2014  . Barrett's esophagus 12/13/2014  . Hypothyroidism 12/13/2014  . High cholesterol 12/13/2014    Teena Irani, PTA/CLT [phone]  10/29/2016, 2:51 PM  [place] [address] [place], [place], [address] Phone: [phone]   Fax:  [phone]  Name: Terry Allen MRN: [id number] Date of Birth: 08-15-1941

## 2016-10-31 ENCOUNTER — Ambulatory Visit (HOSPITAL_COMMUNITY): Payer: PPO

## 2016-10-31 DIAGNOSIS — R2681 Unsteadiness on feet: Secondary | ICD-10-CM

## 2016-10-31 DIAGNOSIS — Z9181 History of falling: Secondary | ICD-10-CM

## 2016-10-31 DIAGNOSIS — M6281 Muscle weakness (generalized): Secondary | ICD-10-CM

## 2016-10-31 NOTE — Therapy (Signed)
[place], [place], [address] Phone: [phone]   Fax:  [phone]  Physical Therapy Treatment  Patient Details  Name: Terry Allen MRN: [id number] Date of Birth: 09-28-1940 Referring Provider: Jenna Luo, MD  Encounter Date: 10/31/2016      PT End of Session - 10/31/16 1309    Visit Number 6   Number of Visits 13   Date for PT Re-Evaluation 11/05/16   Authorization Type [employer]   Authorization Time Period 10/15/16 to 11/26/16   PT Start Time 1303   PT Stop Time 1344   PT Time Calculation (min) 41 min   Equipment Utilized During Treatment Gait belt   Activity Tolerance Patient tolerated treatment well;No increased pain   Behavior During Therapy WFL for tasks assessed/performed      Past Medical History:  Diagnosis Date  . Acid reflux    Patient reports Barretts  . Bulging lumbar disc   . High cholesterol   . Hypothyroidism   . Nerve damage   . Osteopenia   . Pelvic pain in female    due to bladder sling    Past Surgical History:  Procedure Laterality Date  . APPENDECTOMY  1960  . BLADDER SUSPENSION    . ESOPHAGOGASTRODUODENOSCOPY N/A 12/21/2014   Procedure: ESOPHAGOGASTRODUODENOSCOPY (EGD);  Surgeon: Rogene Houston, MD;  [facility];  Service: Endoscopy;  Laterality: N/A;  210  . ESOPHAGOGASTRODUODENOSCOPY N/A 06/16/2015   Procedure: ESOPHAGOGASTRODUODENOSCOPY (EGD);  Surgeon: Rogene Houston, MD;  [facility];  Service: Endoscopy;  Laterality: N/A;  1250    There were no vitals filed for this visit.      Subjective Assessment - 10/31/16 1307    Subjective Pt stated she is feeling good today, no reports of pain.  Continues to be compliant with HEP daily.  Reports she worked out in her yard trimming rose bush and was a little sore following yesterday.     Patient Stated Goals improve her balance    Currently in Pain? No/denies                          [facility] Adult PT Treatment/Exercise - 10/31/16 0001      Knee/Hip Exercises: Standing   Heel Raises 20 reps   Heel Raises Limitations toe raises 20 reps on slope   Hip Abduction 20 reps;Both;Knee straight   Abduction Limitations RTB and mirror to improve form and      Knee/Hip Exercises: Seated   Sit to Sand 10 reps;without UE support  RTB to reduce valgus             Balance Exercises - 10/31/16 1331      Balance Exercises: Standing   Tandem Stance Foam/compliant surface;Eyes open;3 reps;10 secs   SLS Eyes open;3 reps  Lt 14'. Rt 13   SLS with Vectors Solid surface;Intermittent upper extremity assist;3 reps  3x 5" bil LE   Balance Beam foward tandem 2RT on balance beam; retro gait 1RT   Gait with Head Turns 2 reps   Tandem Gait 1 rep;Foam/compliant surface;2 reps   Sidestepping 2 reps             PT Short Term Goals - 10/15/16 1611      PT SHORT TERM GOAL #1   Title Pt will demo consistency and independence with her HEP to improve strength and balance.    Time 2  Period Weeks   Status New     PT SHORT TERM GOAL #2   Title Pt will demo improved ankle DF AROM to atleast 10 deg, to aid in balance reactions.    Time 3   Period Weeks   Status New           PT Long Term Goals - 10/15/16 1613      PT LONG TERM GOAL #1   Title Pt will demo improved BLE strength to atleast 4+/5 MMT which will increase her safety with functional activity.    Time 6   Period Weeks   Status New     PT LONG TERM GOAL #2   Title Pt will score atleast an 8 point improvement in her Berg balance test, to indicate a signficant improvement in her balance.    Time 6   Period Weeks   Status New     PT LONG TERM GOAL #3   Title Pt will perform 5x sit to stand in atleast 12 sec without UE support, to demonstrate an improvement in functional strength and power.   Time 6   Period Weeks   Status New     PT LONG TERM GOAL #4   Title Pt will  demo consistency and understanding of her advanced HEP to prepare for discharge home.    Time 6   Period Weeks   Status New               Plan - 10/31/16 1457    Clinical Impression Statement Continued session focus on with proximal LE strengthening and balance.  Added resistance with isolated hip strengtening exercises and able to progress balance gait activities to dynamic surface.  Pt with muliple LOB with static balance activities requiring min A for safety.  Improved abilities with tandem gait with ability to complete independently withno LOB, progressed to balance beam with min A required for safety.  No reports of pain through pain, was limited by fatigue.     Rehab Potential Good   PT Frequency 2x / week   PT Duration 6 weeks   PT Treatment/Interventions Vestibular;Passive range of motion;Manual techniques;Patient/family education;Neuromuscular re-education;Balance training;Therapeutic exercise;Therapeutic activities;Functional mobility training;Stair training;Gait training;ADLs/Self Care Home Management   PT Next Visit Plan Continue to strengthening LE musculature and improve balance.       PT Home Exercise Plan supine bridge x10 reps, gastroc stretch 3x30 sec each; 10/25/2016 sidelying abd, prone hip extension and SLS       Patient will benefit from skilled therapeutic intervention in order to improve the following deficits and impairments:  Abnormal gait, Decreased activity tolerance, Decreased strength, Impaired flexibility, Pain, Postural dysfunction, Improper body mechanics, Decreased range of motion, Decreased balance, Decreased mobility, Increased muscle spasms, Hypomobility  Visit Diagnosis: Unsteadiness on feet  History of falling  Muscle weakness (generalized)     Problem List Patient Active Problem List   Diagnosis Date Noted  . GERD (gastroesophageal reflux disease) 12/13/2014  . Barrett's esophagus 12/13/2014  . Hypothyroidism 12/13/2014  . High  cholesterol 12/13/2014   Terry Allen, LPTA; [place]  Aldona Lento 10/31/2016, 3:03 PM  [place] [address] [place], [place], [address] Phone: [phone]   Fax:  [phone]  Name: Terry Allen MRN: [id number] Date of Birth: 02-Mar-1941

## 2016-11-05 ENCOUNTER — Ambulatory Visit (HOSPITAL_COMMUNITY): Payer: PPO | Admitting: Physical Therapy

## 2016-11-05 ENCOUNTER — Other Ambulatory Visit: Payer: Self-pay | Admitting: Physician Assistant

## 2016-11-05 DIAGNOSIS — M6281 Muscle weakness (generalized): Secondary | ICD-10-CM

## 2016-11-05 DIAGNOSIS — R2681 Unsteadiness on feet: Secondary | ICD-10-CM

## 2016-11-05 DIAGNOSIS — Z9181 History of falling: Secondary | ICD-10-CM

## 2016-11-05 NOTE — Therapy (Addendum)
[facility] [address] [place], [place], [address] Phone: [phone]   Fax:  [phone]  Physical Therapy Treatment/Reassessment/Discharge  Patient Details  Name: Terry Allen MRN: [id number] Date of Birth: 12/15/1940 Referring Provider: Warren Pickard, MD  Encounter Date: 11/05/2016      PT End of Session - 11/05/16 1351    Visit Number 7   Number of Visits 13   Date for PT Re-Evaluation 11/26/16   Authorization Type [employer]   Authorization Time Period 10/15/16 to 11/26/16   PT Start Time 1302   PT Stop Time 1345   PT Time Calculation (min) 43 min   Equipment Utilized During Treatment Gait belt   Activity Tolerance Patient tolerated treatment well;No increased pain   Behavior During Therapy WFL for tasks assessed/performed      Past Medical History:  Diagnosis Date  . Acid reflux    Patient reports Barretts  . Bulging lumbar disc   . High cholesterol   . Hypothyroidism   . Nerve damage   . Osteopenia   . Pelvic pain in female    due to bladder sling    Past Surgical History:  Procedure Laterality Date  . APPENDECTOMY  1960  . BLADDER SUSPENSION    . ESOPHAGOGASTRODUODENOSCOPY N/A 12/21/2014   Procedure: ESOPHAGOGASTRODUODENOSCOPY (EGD);  Surgeon: Najeeb U Rehman, MD;  [facility];  Service: Endoscopy;  Laterality: N/A;  210  . ESOPHAGOGASTRODUODENOSCOPY N/A 06/16/2015   Procedure: ESOPHAGOGASTRODUODENOSCOPY (EGD);  Surgeon: Najeeb U Rehman, MD;  [facility];  Service: Endoscopy;  Laterality: N/A;  1250    There were no vitals filed for this visit.      Subjective Assessment - 11/05/16 1306    Subjective Pt feels that she is improving since beginning PT. She still has some activities that are pretty difficult but overall she is very pleased.    Pertinent History acid reflux, high cholesterol, osteopenia.   Patient Stated Goals improve her balance    Currently in Pain?  No/denies            [facility] PT Assessment - 11/05/16 0001      Assessment   Medical Diagnosis Multiple Falls   Referring Provider Warren Pickard, MD   Onset Date/Surgical Date --  ~1 year ago    Next MD Visit none as of now    Prior Therapy none     Precautions   Precautions None     Balance Screen   Has the patient fallen in the past 6 months No   How many times? No fall since beginning PT   Has the patient had a decrease in activity level because of a fear of falling?  No   Is the patient reluctant to leave their home because of a fear of falling?  No     Home Environment   Living Environment Private residence   Additional [place] and just bought a house which she is trying to fix up      Prior Function   Level of Independence Independent     Cognition   Overall Cognitive Status Within Functional Limits for tasks assessed     Strength   Right Hip Flexion 5/5   Right Hip Extension 5/5   Right Hip ABduction 4+/5   Left Hip Flexion 5/5   Left Hip Extension 5/5   Left Hip ABduction 4/5   Right Knee Flexion 4+/5   Right Knee   Extension 5/5   Left Knee Flexion 4+/5   Left Knee Extension 5/5   Right Ankle Dorsiflexion 5/5   Right Ankle Plantar Flexion 5/5  completed 25 reps    Left Ankle Dorsiflexion 4+/5     Transfers   Five time sit to stand comments  7 sec, no UE support     Standardized Balance Assessment   Standardized Balance Assessment Berg Balance Test;Dynamic Gait Index     Berg Balance Test   Sit to Stand Able to stand without using hands and stabilize independently   Standing Unsupported Able to stand safely 2 minutes   Sitting with Back Unsupported but Feet Supported on Floor or Stool Able to sit safely and securely 2 minutes   Stand to Sit Sits safely with minimal use of hands   Transfers Able to transfer safely, minor use of hands   Standing Unsupported with Eyes Closed Able to stand 10 seconds safely   Standing Ubsupported with  Feet Together Able to place feet together independently and stand 1 minute safely   From Standing, Reach Forward with Outstretched Arm Can reach confidently >25 cm (10")   From Standing Position, Pick up Object from Floor Able to pick up shoe safely and easily   From Standing Position, Turn to Look Behind Over each Shoulder Looks behind from both sides and weight shifts well   Turn 360 Degrees Able to turn 360 degrees safely in 4 seconds or less   Standing Unsupported, Alternately Place Feet on Step/Stool Able to stand independently and safely and complete 8 steps in 20 seconds   Standing Unsupported, One Foot in Front Able to place foot tandem independently and hold 30 seconds  Lt 30 sec, Rt 30 sec    Standing on One Leg Able to lift leg independently and hold > 10 seconds  Lt 15 sec , Rt 15 sec    Total Score 56     Dynamic Gait Index   Level Surface Normal   Change in Gait Speed Normal   Gait with Horizontal Head Turns Mild Impairment   Gait with Vertical Head Turns Mild Impairment   Gait and Pivot Turn Normal   Step Over Obstacle Mild Impairment   Step Around Obstacles Normal   Steps Normal   Total Score 21       *Standing gastroc stretch demonstration for HEP performance x30 sec on the Lt.                      PT Education - 11/05/16 1353    Education provided Yes   Education Details discussed goals met and overall improvements in ROM, strength, balance and functional mobility; discussed decrease in PT frequency and possible d/c at next session assuming no issues arise; addition to HEP   Person(s) Educated Patient   Methods Explanation;Demonstration;Handout   Comprehension Verbalized understanding;Returned demonstration          PT Short Term Goals - 11/05/16 1337      PT SHORT TERM GOAL #1   Title Pt will demo consistency and independence with her HEP to improve strength and balance.    Time 2   Period Weeks   Status Achieved     PT SHORT TERM  GOAL #2   Title Pt will demo improved ankle DF AROM to atleast 10 deg, to aid in balance reactions.    Baseline Lt 5 deg DF, Rt 10 deg   Time 3   Period Weeks     Status Partially Met           PT Long Term Goals - 11/05/16 1337      PT LONG TERM GOAL #1   Title Pt will demo improved BLE strength to atleast 4+/5 MMT which will increase her safety with functional activity.    Time 6   Period Weeks   Status Achieved     PT LONG TERM GOAL #2   Title Pt will score atleast an 8 point improvement in her Berg balance test, to indicate a signficant improvement in her balance.    Time 6   Period Weeks   Status Achieved     PT LONG TERM GOAL #3   Title Pt will perform 5x sit to stand in atleast 12 sec without UE support, to demonstrate an improvement in functional strength and power.   Baseline 7 sec   Time 6   Period Weeks   Status Achieved     PT LONG TERM GOAL #4   Title Pt will demo consistency and understanding of her advanced HEP to prepare for discharge home.    Time 6   Period Weeks   Status Achieved               Plan - 11/05/16 1354    Clinical Impression Statement Pt was reassessed this visit having made excellent progress towards all goals with increases in strength, ankle ROM, balance and overall mobility. She scored a 56/56 on the Berg balance and a 21/24 on the DGI which places her at a low risk of falling in the community or at home. She continues to perform her HEP regularly and has made the recommended adjustments to her sleeping habits and daily routine to improve her overall flexibility, strength and safety. Due to her progress, we discussed decreasing her PT frequency to allow her to practice independence with her HEP and prepare for upcoming discharge. She verbalized understanding at this time.    Rehab Potential Good   PT Frequency 2x / week   PT Duration 6 weeks   PT Treatment/Interventions Vestibular;Passive range of motion;Manual  techniques;Patient/family education;Neuromuscular re-education;Balance training;Therapeutic exercise;Therapeutic activities;Functional mobility training;Stair training;Gait training;ADLs/Self Care Home Management   PT Next Visit Plan possibel d/c with advanced HEP; discuss follow up with vestibular evaluation if pt interested; gastroc stretch; dynamic balance with cognitive component   PT Home Exercise Plan supine bridge x10 reps, gastroc stretch 3x30 sec each; 10/25/2016 sidelying abd, prone hip extension and SLS    Consulted and Agree with Plan of Care Patient      Patient will benefit from skilled therapeutic intervention in order to improve the following deficits and impairments:  Abnormal gait, Decreased activity tolerance, Decreased strength, Impaired flexibility, Pain, Postural dysfunction, Improper body mechanics, Decreased range of motion, Decreased balance, Decreased mobility, Increased muscle spasms, Hypomobility  Visit Diagnosis: Unsteadiness on feet  History of falling  Muscle weakness (generalized)       G-Codes - 11/05/16 1350    Functional Assessment Tool Used (Outpatient Only) Clinical judgement based on assessment of ROM, strength, mobility and balance   Functional Limitation Mobility: Walking and moving around   Mobility: Walking and Moving Around Current Status (G8978) At least 1 percent but less than 20 percent impaired, limited or restricted   Mobility: Walking and Moving Around Goal Status (G8979) At least 20 percent but less than 40 percent impaired, limited or restricted      Problem List Patient Active Problem List   Diagnosis Date   Noted  . GERD (gastroesophageal reflux disease) 12/13/2014  . Barrett's esophagus 12/13/2014  . Hypothyroidism 12/13/2014  . High cholesterol 12/13/2014    4:44 PM,11/05/16 Elly Modena PT, DPT [facility] [address] [place], [place], [address] Phone: [phone]   Fax:  [phone]  Name: RAFFAELLA EDISON MRN: [id number] Date of Birth: 02-15-1941  *addendum to resolve episode of care and d/c pt from [place]  Visits from Start of Care: 7  Current functional level related to goals / functional outcomes: See above for more details    Remaining deficits: See above for more details    Education / Equipment: See above for more details  Plan: Patient agrees to discharge.  Patient goals were met. Patient is being discharged due to not returning since the last visit.  ?????    10:26 AM,04/22/17 Elly Modena PT, [facility] [phone]

## 2016-11-06 NOTE — Telephone Encounter (Signed)
Refill appropriate 

## 2016-11-07 ENCOUNTER — Ambulatory Visit (HOSPITAL_COMMUNITY): Payer: PPO | Admitting: Physical Therapy

## 2016-11-11 ENCOUNTER — Telehealth (HOSPITAL_COMMUNITY): Payer: Self-pay | Admitting: Family Medicine

## 2016-11-11 NOTE — Telephone Encounter (Signed)
11/11/16 pt called to cx her appt and said that she had been sick with the flu.  It was the last appt on the schedule and when I asked if she wanted to reschedule she said she didn't know when she could get up here.  She said that she would get up here and straighten up her account.

## 2016-11-12 ENCOUNTER — Ambulatory Visit (HOSPITAL_COMMUNITY): Payer: PPO | Admitting: Physical Therapy

## 2016-11-14 ENCOUNTER — Encounter (HOSPITAL_COMMUNITY): Payer: PPO

## 2016-12-13 ENCOUNTER — Encounter: Payer: Self-pay | Admitting: Family Medicine

## 2016-12-13 ENCOUNTER — Ambulatory Visit (INDEPENDENT_AMBULATORY_CARE_PROVIDER_SITE_OTHER): Payer: PPO | Admitting: Family Medicine

## 2016-12-13 VITALS — BP 110/70 | HR 93 | Temp 97.6°F | Resp 16 | Wt 123.0 lb

## 2016-12-13 DIAGNOSIS — R1314 Dysphagia, pharyngoesophageal phase: Secondary | ICD-10-CM | POA: Diagnosis not present

## 2016-12-13 DIAGNOSIS — R5383 Other fatigue: Secondary | ICD-10-CM

## 2016-12-13 LAB — CBC WITH DIFFERENTIAL/PLATELET
BASOS ABS: 0 {cells}/uL (ref 0–200)
BASOS PCT: 0 %
EOS PCT: 3 %
Eosinophils Absolute: 186 cells/uL (ref 15–500)
HCT: 39.4 % (ref 35.0–45.0)
HEMOGLOBIN: 13.2 g/dL (ref 12.0–15.0)
LYMPHS ABS: 1426 {cells}/uL (ref 850–3900)
Lymphocytes Relative: 23 %
MCH: 31.2 pg (ref 27.0–33.0)
MCHC: 33.5 g/dL (ref 32.0–36.0)
MCV: 93.1 fL (ref 80.0–100.0)
MONOS PCT: 11 %
MPV: 10.2 fL (ref 7.5–12.5)
Monocytes Absolute: 682 cells/uL (ref 200–950)
NEUTROS ABS: 3906 {cells}/uL (ref 1500–7800)
Neutrophils Relative %: 63 %
PLATELETS: 260 10*3/uL (ref 140–400)
RBC: 4.23 MIL/uL (ref 3.80–5.10)
RDW: 13.1 % (ref 11.0–15.0)
WBC: 6.2 10*3/uL (ref 3.8–10.8)

## 2016-12-13 MED ORDER — FLUTICASONE PROPIONATE HFA 220 MCG/ACT IN AERO: 2.0000 | INHALATION_SPRAY | Freq: Two times a day (BID) | RESPIRATORY_TRACT | 0 refills | Status: DC

## 2016-12-13 NOTE — Progress Notes (Signed)
Subjective:    Patient ID: Terry Allen, female    DOB: 08/31/1941, 76 y.o.   MRN: [id number]  HPI Patient has a history of Barrett's esophagus. She had an EGD performed in 2016 the revealed no significant abnormalities. Over the last 2-3 weeks, the patient reports increasing dysphasia. Recently she had to calcium tablets become stuck in her throat. Eventually the past but ever since that time she reports pain in her esophagus. She states that she has to strain to swallow. She denies any food sticking in her esophagus but she does complain of odynophasia and dysphasia. She also continues to complain of fatigue. At her last visit, her TSH was found to be elevated at 9 and her dose of levothyroxine was subtherapeutic. I increased the dose but she is due to recheck her TSH today Past Medical History:  Diagnosis Date  . Acid reflux    Patient reports Barretts  . Bulging lumbar disc   . High cholesterol   . Hypothyroidism   . Nerve damage   . Osteopenia   . Pelvic pain in female    due to bladder sling   Past Surgical History:  Procedure Laterality Date  . APPENDECTOMY  1960  . BLADDER SUSPENSION    . ESOPHAGOGASTRODUODENOSCOPY N/A 12/21/2014   Procedure: ESOPHAGOGASTRODUODENOSCOPY (EGD);  Surgeon: Rogene Houston, MD;  [facility];  Service: Endoscopy;  Laterality: N/A;  210  . ESOPHAGOGASTRODUODENOSCOPY N/A 06/16/2015   Procedure: ESOPHAGOGASTRODUODENOSCOPY (EGD);  Surgeon: Rogene Houston, MD;  [facility];  Service: Endoscopy;  Laterality: N/A;  1250   Current Outpatient Prescriptions on File Prior to Visit  Medication Sig Dispense Refill  . acetaminophen (TYLENOL) 500 MG tablet Take 1,000 mg by mouth every 6 (six) hours as needed.    Marland Kitchen amitriptyline (ELAVIL) 50 MG tablet Take 50 mg by mouth at bedtime.    Marland Kitchen aspirin 81 MG tablet Take 81 mg by mouth daily.    . calcium carbonate (OS-CAL) 600 MG TABS tablet Take 600 mg by mouth 2 (two) times daily with a  meal.    . gabapentin (NEURONTIN) 300 MG capsule Take 2 capsules (600 mg total) by mouth 3 (three) times daily. 540 capsule 4  . levothyroxine (SYNTHROID, LEVOTHROID) 112 MCG tablet Take 1 tablet (112 mcg total) by mouth daily. 90 tablet 3  . Multiple Vitamin (MULTIVITAMIN) tablet Take 1 tablet by mouth daily.    . pantoprazole (PROTONIX) 40 MG tablet TAKE ONE TABLET BY MOUTH ONCE DAILY 30 tablet 3  . simvastatin (ZOCOR) 40 MG tablet TAKE ONE TABLET BY MOUTH ONCE DAILY 90 tablet 0   No current facility-administered medications on file prior to visit.    No Known Allergies Social History   Social History  . Marital status: Divorced    Spouse name: N/A  . Number of children: N/A  . Years of education: N/A   Occupational History  . Not on file.   Social History Main Topics  . Smoking status: Never Smoker  . Smokeless tobacco: Never Used  . Alcohol use No  . Drug use: No  . Sexual activity: No   Other Topics Concern  . Not on file   Social History Narrative  . No narrative on file      Review of Systems  All other systems reviewed and are negative.      Objective:   Physical Exam  Constitutional: She appears well-developed and well-nourished.  Neck: Neck supple. No  JVD present. No thyromegaly present.  Cardiovascular: Normal rate, regular rhythm and normal heart sounds.   No murmur heard. Pulmonary/Chest: Effort normal and breath sounds normal. No respiratory distress. She has no wheezes. She has no rales.  Abdominal: Soft. Bowel sounds are normal. She exhibits no distension. There is no tenderness. There is no rebound and no guarding.  Musculoskeletal: She exhibits no edema.  Lymphadenopathy:    She has no cervical adenopathy.  Vitals reviewed.         Assessment & Plan:  Pharyngoesophageal dysphagia - Plan: fluticasone (FLOVENT HFA) 220 MCG/ACT inhaler  Fatigue, unspecified type - Plan: CBC with Differential/Platelet, COMPLETE METABOLIC PANEL WITH GFR,  TSH  The patient is already taking protonix and has been taking Mylanta on a daily basis with no relief. I'm concerned that she may have eosinophilic esophagitis. I will try the patient on Flovent 220 g per actuation, 2 puffs swallowed twice a day, not inhaled. Recheck in 2 weeks to see if symptoms are improving. If not, recommend repeat EGD or GI consultation. Regarding her fatigue I will recheck her TSH to ensure appropriate dose of levothyroxine along with a CBC and a CMP.

## 2016-12-14 LAB — COMPLETE METABOLIC PANEL WITH GFR
ALBUMIN: 3.8 g/dL (ref 3.6–5.1)
ALK PHOS: 43 U/L (ref 33–130)
ALT: 12 U/L (ref 6–29)
AST: 26 U/L (ref 10–35)
BILIRUBIN TOTAL: 0.9 mg/dL (ref 0.2–1.2)
BUN: 11 mg/dL (ref 7–25)
CO2: 26 mmol/L (ref 20–31)
CREATININE: 0.78 mg/dL (ref 0.60–0.93)
Calcium: 9.5 mg/dL (ref 8.6–10.4)
Chloride: 103 mmol/L (ref 98–110)
GFR, EST NON AFRICAN AMERICAN: 75 mL/min (ref >=60)
GFR, Est African American: 87 mL/min (ref >=60)
GLUCOSE: 94 mg/dL (ref 70–99)
Potassium: 4.2 mmol/L (ref 3.5–5.3)
SODIUM: 143 mmol/L (ref 135–146)
TOTAL PROTEIN: 6.8 g/dL (ref 6.1–8.1)

## 2016-12-14 LAB — TSH: TSH: 0.02 mIU/L — ABNORMAL LOW

## 2016-12-26 ENCOUNTER — Other Ambulatory Visit: Payer: Self-pay | Admitting: Family Medicine

## 2016-12-26 ENCOUNTER — Encounter: Payer: Self-pay | Admitting: Family Medicine

## 2016-12-26 MED ORDER — LEVOTHYROXINE SODIUM 100 MCG PO TABS: 100.0000 ug | ORAL_TABLET | Freq: Every day | ORAL | 3 refills | Status: DC

## 2017-01-28 ENCOUNTER — Encounter: Payer: Self-pay | Admitting: Family Medicine

## 2017-01-28 ENCOUNTER — Ambulatory Visit (INDEPENDENT_AMBULATORY_CARE_PROVIDER_SITE_OTHER): Payer: PPO | Admitting: Family Medicine

## 2017-01-28 VITALS — BP 136/80 | HR 76 | Temp 97.9°F | Resp 18 | Ht 62.0 in | Wt 124.0 lb

## 2017-01-28 DIAGNOSIS — R0789 Other chest pain: Secondary | ICD-10-CM

## 2017-01-28 DIAGNOSIS — R1314 Dysphagia, pharyngoesophageal phase: Secondary | ICD-10-CM

## 2017-01-28 DIAGNOSIS — R300 Dysuria: Secondary | ICD-10-CM | POA: Diagnosis not present

## 2017-01-28 LAB — URINALYSIS, MICROSCOPIC ONLY
Bacteria, UA: NONE SEEN [HPF]
CASTS: NONE SEEN [LPF]
Crystals: NONE SEEN [HPF]
Yeast: NONE SEEN [HPF]

## 2017-01-28 LAB — URINALYSIS, ROUTINE W REFLEX MICROSCOPIC
Bilirubin Urine: NEGATIVE
Glucose, UA: NEGATIVE
KETONES UR: NEGATIVE
NITRITE: NEGATIVE
PH: 6.5 (ref 5.0–8.0)
Protein, ur: NEGATIVE
SPECIFIC GRAVITY, URINE: 1.01 (ref 1.001–1.035)

## 2017-01-28 MED ORDER — CIPROFLOXACIN HCL 500 MG PO TABS: 500.0000 mg | ORAL_TABLET | Freq: Two times a day (BID) | ORAL | 0 refills | Status: DC

## 2017-01-28 MED ORDER — CLONAZEPAM 0.5 MG PO TABS: 0.5000 mg | ORAL_TABLET | Freq: Two times a day (BID) | ORAL | 1 refills | Status: DC | PRN

## 2017-01-28 NOTE — Progress Notes (Signed)
Subjective:    Patient ID: Terry Allen, female    DOB: 1940/10/13, 76 y.o.   MRN: [id number]  HPI  12/13/16 Patient has a history of Barrett's esophagus. She had an EGD performed in 2016 the revealed no significant abnormalities. Over the last 2-3 weeks, the patient reports increasing dysphasia. Recently she had to calcium tablets become stuck in her throat. Eventually they pass but ever since that time she reports pain in her esophagus. She states that she has to strain to swallow. She denies any food sticking in her esophagus but she does complain of odynophasia and dysphasia. She also continues to complain of fatigue. At her last visit, her TSH was found to be elevated at 9 and her dose of levothyroxine was subtherapeutic. I increased the dose but she is due to recheck her TSH today.  At that time, my plan was: The patient is already taking protonix and has been taking Mylanta on a daily basis with no relief. I'm concerned that she may have eosinophilic esophagitis. I will try the patient on Flovent 220 g per actuation, 2 puffs swallowed twice a day, not inhaled. Recheck in 2 weeks to see if symptoms are improving. If not, recommend repeat EGD or GI consultation. Regarding her fatigue I will recheck her TSH to ensure appropriate dose of levothyroxine along with a CBC and a CMP.  01/28/17 TSH revealed that her dose of levothyroxine was supratherapeutic recommended reducing her dose of levothyroxine back to 100 g a day and rechecking her TSH in 2 months. She is only been on this dose for 1 month. She is here primarily today complaining of 2 weeks of dysuria. Urinalysis shows no nitrites but leukocyte esterase is present along with trace blood and occasional white blood cells seen per high-powered field. She continues to endorse substernal chest pain. There is no exacerbating or alleviating factors. Food does not make it worse. Now she denies any odynophagia. I do not believe that she ever took  Flovent however she denies any improvement. She does not feel that the [place] medication is helping. She's had an extensive workup to date. She denies any angina or shortness of breath or dyspnea on exertion. Past Medical History:  Diagnosis Date  . Acid reflux    Patient reports Barretts  . Bulging lumbar disc   . High cholesterol   . Hypothyroidism   . Nerve damage   . Osteopenia   . Pelvic pain in female    due to bladder sling   Past Surgical History:  Procedure Laterality Date  . APPENDECTOMY  1960  . BLADDER SUSPENSION    . ESOPHAGOGASTRODUODENOSCOPY N/A 12/21/2014   Procedure: ESOPHAGOGASTRODUODENOSCOPY (EGD);  Surgeon: Rogene Houston, MD;  [facility];  Service: Endoscopy;  Laterality: N/A;  210  . ESOPHAGOGASTRODUODENOSCOPY N/A 06/16/2015   Procedure: ESOPHAGOGASTRODUODENOSCOPY (EGD);  Surgeon: Rogene Houston, MD;  [facility];  Service: Endoscopy;  Laterality: N/A;  1250   Current Outpatient Prescriptions on File Prior to Visit  Medication Sig Dispense Refill  . acetaminophen (TYLENOL) 500 MG tablet Take 1,000 mg by mouth every 6 (six) hours as needed.    Marland Kitchen amitriptyline (ELAVIL) 50 MG tablet Take 50 mg by mouth at bedtime.    Marland Kitchen aspirin 81 MG tablet Take 81 mg by mouth daily.    . calcium carbonate (OS-CAL) 600 MG TABS tablet Take 600 mg by mouth 2 (two) times daily with a meal.    . fluticasone (  FLOVENT HFA) 220 MCG/ACT inhaler Inhale 2 puffs into the lungs 2 (two) times daily. Do not inhale, swallow the spray. 1 Inhaler 0  . gabapentin (NEURONTIN) 300 MG capsule Take 2 capsules (600 mg total) by mouth 3 (three) times daily. 540 capsule 4  . levothyroxine (SYNTHROID, LEVOTHROID) 100 MCG tablet Take 1 tablet (100 mcg total) by mouth daily. 90 tablet 3  . Multiple Vitamin (MULTIVITAMIN) tablet Take 1 tablet by mouth daily.    . pantoprazole (PROTONIX) 40 MG tablet TAKE ONE TABLET BY MOUTH ONCE DAILY 30 tablet 3  . simvastatin (ZOCOR) 40 MG tablet TAKE  ONE TABLET BY MOUTH ONCE DAILY 90 tablet 0   No current facility-administered medications on file prior to visit.    No Known Allergies Social History   Social History  . Marital status: Divorced    Spouse name: N/A  . Number of children: N/A  . Years of education: N/A   Occupational History  . Not on file.   Social History Main Topics  . Smoking status: Never Smoker  . Smokeless tobacco: Never Used  . Alcohol use No  . Drug use: No  . Sexual activity: No   Other Topics Concern  . Not on file   Social History Narrative  . No narrative on file      Review of Systems  All other systems reviewed and are negative.      Objective:   Physical Exam  Constitutional: She appears well-developed and well-nourished.  Neck: Neck supple. No JVD present. No thyromegaly present.  Cardiovascular: Normal rate, regular rhythm and normal heart sounds.   No murmur heard. Pulmonary/Chest: Effort normal and breath sounds normal. No respiratory distress. She has no wheezes. She has no rales.  Abdominal: Soft. Bowel sounds are normal. She exhibits no distension. There is no tenderness. There is no rebound and no guarding.  Musculoskeletal: She exhibits no edema.  Lymphadenopathy:    She has no cervical adenopathy.  Vitals reviewed.         Assessment & Plan:  Burning with urination - Plan: Urinalysis, Routine w reflex microscopic, ciprofloxacin (CIPRO) 500 MG tablet  Pharyngoesophageal dysphagia  Other chest pain  I'm starting to believe a lot of this could be anxiety. I asked the patient to take Klonopin 0.5 mg twice a day as needed for chest pain and then recheck with me in one week to see if her symptoms have improved. If chest pain is improving, it could be musculoskeletal or possibly psychosomatic related to anxiety. She states that it is possible. She does complain that the pain is also a tight sensation and it could be due to muscle tension. Therefore she is willing to try  the Klonopin. I would like to recheck a TSH in one additional month. I will treat the patient for possible bladder infection given the findings on her urinalysis with Cipro 500 mg by mouth twice a day for 5 days.

## 2017-01-28 NOTE — Addendum Note (Signed)
Addended by: Shary Decamp B on: 01/28/2017 04:36 PM   Modules accepted: Orders

## 2017-01-29 LAB — URINE CULTURE: Organism ID, Bacteria: NO GROWTH

## 2017-02-11 ENCOUNTER — Other Ambulatory Visit: Payer: Self-pay | Admitting: Physician Assistant

## 2017-02-11 NOTE — Telephone Encounter (Signed)
Refill appropriate 

## 2017-03-03 ENCOUNTER — Other Ambulatory Visit: Payer: Self-pay

## 2017-03-11 DIAGNOSIS — Z6822 Body mass index (BMI) 22.0-22.9, adult: Secondary | ICD-10-CM | POA: Diagnosis not present

## 2017-03-11 DIAGNOSIS — R102 Pelvic and perineal pain: Secondary | ICD-10-CM | POA: Diagnosis not present

## 2017-03-14 DIAGNOSIS — R102 Pelvic and perineal pain: Secondary | ICD-10-CM | POA: Diagnosis not present

## 2017-04-01 DIAGNOSIS — E039 Hypothyroidism, unspecified: Secondary | ICD-10-CM | POA: Diagnosis not present

## 2017-04-01 DIAGNOSIS — E782 Mixed hyperlipidemia: Secondary | ICD-10-CM | POA: Diagnosis not present

## 2017-04-01 DIAGNOSIS — R300 Dysuria: Secondary | ICD-10-CM | POA: Diagnosis not present

## 2017-04-01 DIAGNOSIS — R102 Pelvic and perineal pain: Secondary | ICD-10-CM | POA: Diagnosis not present

## 2017-04-01 DIAGNOSIS — K210 Gastro-esophageal reflux disease with esophagitis: Secondary | ICD-10-CM | POA: Diagnosis not present

## 2017-04-01 DIAGNOSIS — K2270 Barrett's esophagus without dysplasia: Secondary | ICD-10-CM | POA: Diagnosis not present

## 2017-04-01 DIAGNOSIS — Z6822 Body mass index (BMI) 22.0-22.9, adult: Secondary | ICD-10-CM | POA: Diagnosis not present

## 2017-04-15 DIAGNOSIS — E782 Mixed hyperlipidemia: Secondary | ICD-10-CM | POA: Diagnosis not present

## 2017-04-15 DIAGNOSIS — E039 Hypothyroidism, unspecified: Secondary | ICD-10-CM | POA: Diagnosis not present

## 2017-04-16 DIAGNOSIS — Z6821 Body mass index (BMI) 21.0-21.9, adult: Secondary | ICD-10-CM | POA: Diagnosis not present

## 2017-04-16 DIAGNOSIS — K2270 Barrett's esophagus without dysplasia: Secondary | ICD-10-CM | POA: Diagnosis not present

## 2017-04-16 DIAGNOSIS — R300 Dysuria: Secondary | ICD-10-CM | POA: Diagnosis not present

## 2017-04-16 DIAGNOSIS — K210 Gastro-esophageal reflux disease with esophagitis: Secondary | ICD-10-CM | POA: Diagnosis not present

## 2017-04-16 DIAGNOSIS — E782 Mixed hyperlipidemia: Secondary | ICD-10-CM | POA: Diagnosis not present

## 2017-04-16 DIAGNOSIS — E039 Hypothyroidism, unspecified: Secondary | ICD-10-CM | POA: Diagnosis not present

## 2017-04-16 DIAGNOSIS — R102 Pelvic and perineal pain: Secondary | ICD-10-CM | POA: Diagnosis not present

## 2017-04-30 DIAGNOSIS — H524 Presbyopia: Secondary | ICD-10-CM | POA: Diagnosis not present

## 2017-04-30 DIAGNOSIS — H5203 Hypermetropia, bilateral: Secondary | ICD-10-CM | POA: Diagnosis not present

## 2017-04-30 DIAGNOSIS — H52223 Regular astigmatism, bilateral: Secondary | ICD-10-CM | POA: Diagnosis not present

## 2017-04-30 DIAGNOSIS — H25813 Combined forms of age-related cataract, bilateral: Secondary | ICD-10-CM | POA: Diagnosis not present

## 2017-05-10 DIAGNOSIS — R5383 Other fatigue: Secondary | ICD-10-CM | POA: Diagnosis not present

## 2017-05-10 DIAGNOSIS — R102 Pelvic and perineal pain: Secondary | ICD-10-CM | POA: Diagnosis not present

## 2017-05-10 DIAGNOSIS — E559 Vitamin D deficiency, unspecified: Secondary | ICD-10-CM | POA: Diagnosis not present

## 2017-05-10 DIAGNOSIS — E039 Hypothyroidism, unspecified: Secondary | ICD-10-CM | POA: Diagnosis not present

## 2017-05-10 DIAGNOSIS — N390 Urinary tract infection, site not specified: Secondary | ICD-10-CM | POA: Diagnosis not present

## 2017-05-15 DIAGNOSIS — S30860A Insect bite (nonvenomous) of lower back and pelvis, initial encounter: Secondary | ICD-10-CM | POA: Diagnosis not present

## 2017-05-15 DIAGNOSIS — R300 Dysuria: Secondary | ICD-10-CM | POA: Diagnosis not present

## 2017-05-15 DIAGNOSIS — N390 Urinary tract infection, site not specified: Secondary | ICD-10-CM | POA: Diagnosis not present

## 2017-05-15 DIAGNOSIS — E039 Hypothyroidism, unspecified: Secondary | ICD-10-CM | POA: Diagnosis not present

## 2017-05-15 DIAGNOSIS — Z6821 Body mass index (BMI) 21.0-21.9, adult: Secondary | ICD-10-CM | POA: Diagnosis not present

## 2017-05-20 ENCOUNTER — Other Ambulatory Visit: Payer: Self-pay | Admitting: Family Medicine

## 2017-05-22 DIAGNOSIS — K594 Anal spasm: Secondary | ICD-10-CM | POA: Diagnosis not present

## 2017-05-28 DIAGNOSIS — Z6821 Body mass index (BMI) 21.0-21.9, adult: Secondary | ICD-10-CM | POA: Diagnosis not present

## 2017-05-28 DIAGNOSIS — E039 Hypothyroidism, unspecified: Secondary | ICD-10-CM | POA: Diagnosis not present

## 2017-05-28 DIAGNOSIS — R110 Nausea: Secondary | ICD-10-CM | POA: Diagnosis not present

## 2017-06-06 ENCOUNTER — Other Ambulatory Visit (HOSPITAL_COMMUNITY): Payer: Self-pay | Admitting: Internal Medicine

## 2017-06-06 DIAGNOSIS — R110 Nausea: Secondary | ICD-10-CM

## 2017-06-06 DIAGNOSIS — R1084 Generalized abdominal pain: Secondary | ICD-10-CM

## 2017-06-09 ENCOUNTER — Ambulatory Visit (HOSPITAL_COMMUNITY)
Admission: RE | Admit: 2017-06-09 | Discharge: 2017-06-09 | Disposition: A | Discharge status: Home / Self Care | Payer: PPO | Source: Ambulatory Visit | Attending: Internal Medicine | Admitting: Internal Medicine

## 2017-06-09 DIAGNOSIS — D259 Leiomyoma of uterus, unspecified: Secondary | ICD-10-CM | POA: Diagnosis not present

## 2017-06-09 DIAGNOSIS — R110 Nausea: Secondary | ICD-10-CM

## 2017-06-09 DIAGNOSIS — R109 Unspecified abdominal pain: Secondary | ICD-10-CM | POA: Diagnosis not present

## 2017-06-09 DIAGNOSIS — R1084 Generalized abdominal pain: Secondary | ICD-10-CM

## 2017-06-09 DIAGNOSIS — K5730 Diverticulosis of large intestine without perforation or abscess without bleeding: Secondary | ICD-10-CM | POA: Diagnosis not present

## 2017-06-09 MED ORDER — IOPAMIDOL (ISOVUE-300) INJECTION 61%
100.0000 mL | Freq: Once | INTRAVENOUS | Status: AC | PRN
  Administered 2017-06-09: 100 mL via INTRAVENOUS

## 2017-06-10 DIAGNOSIS — E039 Hypothyroidism, unspecified: Secondary | ICD-10-CM | POA: Diagnosis not present

## 2017-06-10 DIAGNOSIS — Z6822 Body mass index (BMI) 22.0-22.9, adult: Secondary | ICD-10-CM | POA: Diagnosis not present

## 2017-06-10 DIAGNOSIS — R110 Nausea: Secondary | ICD-10-CM | POA: Diagnosis not present

## 2017-06-10 DIAGNOSIS — R946 Abnormal results of thyroid function studies: Secondary | ICD-10-CM | POA: Diagnosis not present

## 2017-06-10 DIAGNOSIS — R1031 Right lower quadrant pain: Secondary | ICD-10-CM | POA: Diagnosis not present

## 2017-06-10 DIAGNOSIS — R1084 Generalized abdominal pain: Secondary | ICD-10-CM | POA: Diagnosis not present

## 2017-06-16 ENCOUNTER — Encounter (INDEPENDENT_AMBULATORY_CARE_PROVIDER_SITE_OTHER): Payer: Self-pay | Admitting: Internal Medicine

## 2017-06-16 ENCOUNTER — Encounter (INDEPENDENT_AMBULATORY_CARE_PROVIDER_SITE_OTHER): Payer: Self-pay

## 2017-06-18 DIAGNOSIS — R102 Pelvic and perineal pain: Secondary | ICD-10-CM | POA: Diagnosis not present

## 2017-06-20 ENCOUNTER — Other Ambulatory Visit (HOSPITAL_COMMUNITY): Payer: Self-pay | Admitting: Internal Medicine

## 2017-06-20 DIAGNOSIS — Z1231 Encounter for screening mammogram for malignant neoplasm of breast: Secondary | ICD-10-CM

## 2017-06-25 DIAGNOSIS — R102 Pelvic and perineal pain: Secondary | ICD-10-CM | POA: Diagnosis not present

## 2017-06-27 ENCOUNTER — Ambulatory Visit (HOSPITAL_COMMUNITY): Payer: PPO

## 2017-06-30 ENCOUNTER — Encounter (INDEPENDENT_AMBULATORY_CARE_PROVIDER_SITE_OTHER): Payer: Self-pay | Admitting: Internal Medicine

## 2017-06-30 ENCOUNTER — Ambulatory Visit (INDEPENDENT_AMBULATORY_CARE_PROVIDER_SITE_OTHER): Payer: PPO | Admitting: Internal Medicine

## 2017-06-30 VITALS — BP 116/70 | HR 80 | Temp 97.8°F | Ht 62.0 in | Wt 120.3 lb

## 2017-06-30 DIAGNOSIS — R110 Nausea: Secondary | ICD-10-CM | POA: Diagnosis not present

## 2017-06-30 LAB — HEPATIC FUNCTION PANEL
AG RATIO: 1.6 (calc) (ref 1.0–2.5)
ALKALINE PHOSPHATASE (APISO): 41 U/L (ref 33–130)
ALT: 16 U/L (ref 6–29)
AST: 25 U/L (ref 10–35)
Albumin: 4.1 g/dL (ref 3.6–5.1)
BILIRUBIN INDIRECT: 0.8 mg/dL (ref 0.2–1.2)
Bilirubin, Direct: 0.2 mg/dL (ref 0.0–0.2)
Globulin: 2.6 g/dL (calc) (ref 1.9–3.7)
TOTAL PROTEIN: 6.7 g/dL (ref 6.1–8.1)
Total Bilirubin: 1 mg/dL (ref 0.2–1.2)

## 2017-06-30 LAB — CBC WITH DIFFERENTIAL/PLATELET
BASOS ABS: 36 {cells}/uL (ref 0–200)
Basophils Relative: 0.3 %
EOS ABS: 262 {cells}/uL (ref 15–500)
Eosinophils Relative: 2.2 %
HCT: 39 % (ref 35.0–45.0)
HEMOGLOBIN: 13.2 g/dL (ref 11.7–15.5)
Lymphs Abs: 916 cells/uL (ref 850–3900)
MCH: 31.7 pg (ref 27.0–33.0)
MCHC: 33.8 g/dL (ref 32.0–36.0)
MCV: 93.8 fL (ref 80.0–100.0)
MONOS PCT: 9.7 %
MPV: 11.3 fL (ref 7.5–12.5)
NEUTROS ABS: 9532 {cells}/uL — AB (ref 1500–7800)
NEUTROS PCT: 80.1 %
Platelets: 219 10*3/uL (ref 140–400)
RBC: 4.16 10*6/uL (ref 3.80–5.10)
RDW: 12.8 % (ref 11.0–15.0)
TOTAL LYMPHOCYTE: 7.7 %
WBC mixed population: 1154 cells/uL — ABNORMAL HIGH (ref 200–950)
WBC: 11.9 10*3/uL — ABNORMAL HIGH (ref 3.8–10.8)

## 2017-06-30 NOTE — Progress Notes (Signed)
Subjective:    Patient ID: Terry Allen, female    DOB: [birth year], 76 y.o.   MRN: [id number]  HPI Received referral from Dr. Delphina Cahill for  nausea. She tells me she has chronic nausea x 3 months. She actually has had chronic nausea for several years.  Possible chlecystitis. She has nausea every day. She went to Wednesday last night and started to have chest pain.  She has nausea after she eats. She tells me she has constipation.  She has a BM x 1 a day. No melena or BRRB.  Flex sigmoidoscopy in October of 2013 by Dr. Ruthell Rummage was normal.  She takes 4 Aleve a a day and 8 Tylenol a day for pelvic pain.  Liver enzymes in April were normal.   Underwent a CT abodmen/pelvis with CM in October (generalized abdominal pain and nausea): IMPRESSION: Mild sigmoid diverticulosis. No radiographic evidence of diverticulitis or other acute findings,  1 cm uterine fibroid. CBC    Component Value Date/Time   WBC 6.2 12/13/2016 1604   RBC 4.23 12/13/2016 1604   HGB 13.2 12/13/2016 1604   HCT 39.4 12/13/2016 1604   PLT 260 12/13/2016 1604   MCV 93.1 12/13/2016 1604   MCH 31.2 12/13/2016 1604   MCHC 33.5 12/13/2016 1604   RDW 13.1 12/13/2016 1604   LYMPHSABS 1,426 12/13/2016 1604   MONOABS 682 12/13/2016 1604   EOSABS 186 12/13/2016 1604   BASOSABS 0 12/13/2016 1604   Hepatic Function Panel     Component Value Date/Time   PROT 6.8 12/13/2016 1604   ALBUMIN 3.8 12/13/2016 1604   AST 26 12/13/2016 1604   ALT 12 12/13/2016 1604   ALKPHOS 43 12/13/2016 1604   BILITOT 0.9 12/13/2016 1604   BILIDIR 0.2 06/08/2015 1705   IBILI 0.6 06/08/2015 1705     Last EGD in October of 2016: chnroic GERD, Short segment Barretts, with 3 months hx of epigastric pain associated with post prandial nausea, wt loss:   Impression: No evidence of peptic ulcer disease or gastritis. Wavy GE junction with tiny islands of salmon colored mucosa secondary to short segment Barrett's esophagus documented  previously.   Review of Systems Past Medical History:  Diagnosis Date  . Acid reflux    Patient reports Barretts  . Bulging lumbar disc   . High cholesterol   . Hypothyroidism   . Nerve damage   . Osteopenia   . Pelvic pain in female    due to bladder sling    Past Surgical History:  Procedure Laterality Date  . APPENDECTOMY  1960  . BLADDER SUSPENSION    . ESOPHAGOGASTRODUODENOSCOPY N/A 12/21/2014   Procedure: ESOPHAGOGASTRODUODENOSCOPY (EGD);  Surgeon: Rogene Houston, MD;  [facility]: [facility];  Service: Endoscopy;  Laterality: N/A;  210  . ESOPHAGOGASTRODUODENOSCOPY N/A 06/16/2015   Procedure: ESOPHAGOGASTRODUODENOSCOPY (EGD);  Surgeon: Rogene Houston, MD;  Location: [facility];  Service: Endoscopy;  Laterality: N/A;  1250    No Known Allergies  Current Outpatient Prescriptions on File Prior to Visit  Medication Sig Dispense Refill  . acetaminophen (TYLENOL) 500 MG tablet Take 1,000 mg by mouth every 6 (six) hours as needed.    Marland Kitchen amitriptyline (ELAVIL) 50 MG tablet Take 50 mg by mouth at bedtime.    Marland Kitchen aspirin 81 MG tablet Take 81 mg by mouth daily.    . calcium carbonate (OS-CAL) 600 MG TABS tablet Take 600 mg by mouth 2 (two) times daily with a meal.    .  gabapentin (NEURONTIN) 300 MG capsule Take 2 capsules (600 mg total) by mouth 3 (three) times daily. (Patient taking differently: Take 300 mg by mouth 3 (three) times daily. ) 540 capsule 4  . levothyroxine (SYNTHROID, LEVOTHROID) 100 MCG tablet Take 1 tablet (100 mcg total) by mouth daily. 90 tablet 3  . Multiple Vitamin (MULTIVITAMIN) tablet Take 1 tablet by mouth daily.    . pantoprazole (PROTONIX) 40 MG tablet TAKE ONE TABLET BY MOUTH ONCE DAILY 30 tablet 3  . simvastatin (ZOCOR) 40 MG tablet TAKE 1 TABLET BY MOUTH ONCE DAILY 90 tablet 0  . ciprofloxacin (CIPRO) 500 MG tablet Take 1 tablet (500 mg total) by mouth 2 (two) times daily. 10 tablet 0   No current facility-administered medications on file prior to  visit.         Objective:   Physical Exam Blood pressure 116/70, pulse 80, temperature 97.8 F (36.6 C), height 5\' 2"  (1.575 m), weight 120 lb 4.8 oz (54.6 kg). Alert and oriented. Skin warm and dry. Oral mucosa is moist.   . Sclera anicteric, conjunctivae is pink. Thyroid not enlarged. No cervical lymphadenopathy. Lungs clear. Heart regular rate and rhythm.  Abdomen is soft. Bowel sounds are positive. No hepatomegaly. No abdomina pain. No edema to lower extremities.         Assessment & Plan:  Chronic nausea. Am going to rule out GB disease.  Further recommendations to follow.

## 2017-06-30 NOTE — Patient Instructions (Signed)
HIDA scan. Further recommendations to follow.  

## 2017-07-02 ENCOUNTER — Encounter (HOSPITAL_COMMUNITY): Payer: Self-pay

## 2017-07-02 ENCOUNTER — Encounter (HOSPITAL_COMMUNITY)
Admission: RE | Admit: 2017-07-02 | Discharge: 2017-07-02 | Disposition: A | Discharge status: Home / Self Care | Payer: PPO | Source: Ambulatory Visit | Attending: Internal Medicine | Admitting: Internal Medicine

## 2017-07-02 DIAGNOSIS — R110 Nausea: Secondary | ICD-10-CM

## 2017-07-02 DIAGNOSIS — R109 Unspecified abdominal pain: Secondary | ICD-10-CM | POA: Diagnosis not present

## 2017-07-02 MED ORDER — TECHNETIUM TC 99M MEBROFENIN IV KIT
5.0000 | PACK | Freq: Once | INTRAVENOUS | Status: AC | PRN
  Administered 2017-07-02: 5.3 via INTRAVENOUS

## 2017-07-03 DIAGNOSIS — R102 Pelvic and perineal pain: Secondary | ICD-10-CM | POA: Diagnosis not present

## 2017-07-03 DIAGNOSIS — E782 Mixed hyperlipidemia: Secondary | ICD-10-CM | POA: Diagnosis not present

## 2017-07-03 DIAGNOSIS — Z87440 Personal history of urinary (tract) infections: Secondary | ICD-10-CM | POA: Diagnosis not present

## 2017-07-03 DIAGNOSIS — E039 Hypothyroidism, unspecified: Secondary | ICD-10-CM | POA: Diagnosis not present

## 2017-07-08 DIAGNOSIS — N390 Urinary tract infection, site not specified: Secondary | ICD-10-CM | POA: Diagnosis not present

## 2017-07-09 ENCOUNTER — Emergency Department (HOSPITAL_COMMUNITY)
Admission: EM | Admit: 2017-07-09 | Discharge: 2017-07-09 | Disposition: A | Discharge status: Home / Self Care | Payer: PPO | Attending: Emergency Medicine | Admitting: Emergency Medicine

## 2017-07-09 ENCOUNTER — Encounter (HOSPITAL_COMMUNITY): Payer: Self-pay | Admitting: Emergency Medicine

## 2017-07-09 DIAGNOSIS — Z7982 Long term (current) use of aspirin: Secondary | ICD-10-CM | POA: Insufficient documentation

## 2017-07-09 DIAGNOSIS — N3081 Other cystitis with hematuria: Secondary | ICD-10-CM | POA: Diagnosis not present

## 2017-07-09 DIAGNOSIS — E039 Hypothyroidism, unspecified: Secondary | ICD-10-CM | POA: Insufficient documentation

## 2017-07-09 DIAGNOSIS — R102 Pelvic and perineal pain: Secondary | ICD-10-CM | POA: Diagnosis not present

## 2017-07-09 DIAGNOSIS — E860 Dehydration: Secondary | ICD-10-CM

## 2017-07-09 DIAGNOSIS — N3091 Cystitis, unspecified with hematuria: Secondary | ICD-10-CM | POA: Diagnosis not present

## 2017-07-09 DIAGNOSIS — R300 Dysuria: Secondary | ICD-10-CM | POA: Diagnosis not present

## 2017-07-09 DIAGNOSIS — Z79899 Other long term (current) drug therapy: Secondary | ICD-10-CM | POA: Diagnosis not present

## 2017-07-09 DIAGNOSIS — R319 Hematuria, unspecified: Secondary | ICD-10-CM | POA: Diagnosis present

## 2017-07-09 LAB — CBC WITH DIFFERENTIAL/PLATELET
Basophils Absolute: 0 10*3/uL (ref 0.0–0.1)
Basophils Relative: 0 %
Eosinophils Absolute: 0.1 10*3/uL (ref 0.0–0.7)
Eosinophils Relative: 1 %
HEMATOCRIT: 45.4 % (ref 36.0–46.0)
HEMOGLOBIN: 15.1 g/dL — AB (ref 12.0–15.0)
LYMPHS ABS: 1 10*3/uL (ref 0.7–4.0)
LYMPHS PCT: 14 %
MCH: 32.5 pg (ref 26.0–34.0)
MCHC: 33.3 g/dL (ref 30.0–36.0)
MCV: 97.6 fL (ref 78.0–100.0)
MONOS PCT: 5 %
Monocytes Absolute: 0.4 10*3/uL (ref 0.1–1.0)
NEUTROS ABS: 5.8 10*3/uL (ref 1.7–7.7)
NEUTROS PCT: 80 %
Platelets: 229 10*3/uL (ref 150–400)
RBC: 4.65 MIL/uL (ref 3.87–5.11)
RDW: 13.4 % (ref 11.5–15.5)
WBC: 7.4 10*3/uL (ref 4.0–10.5)

## 2017-07-09 LAB — URINALYSIS, ROUTINE W REFLEX MICROSCOPIC
BILIRUBIN URINE: NEGATIVE
Glucose, UA: NEGATIVE mg/dL
Ketones, ur: NEGATIVE mg/dL
Nitrite: NEGATIVE
PH: 5 (ref 5.0–8.0)
Protein, ur: >=300 mg/dL — AB
SPECIFIC GRAVITY, URINE: 1.023 (ref 1.005–1.030)

## 2017-07-09 LAB — BASIC METABOLIC PANEL
Anion gap: 14 (ref 5–15)
BUN: 20 mg/dL (ref 6–20)
CHLORIDE: 95 mmol/L — AB (ref 101–111)
CO2: 28 mmol/L (ref 22–32)
Calcium: 10.7 mg/dL — ABNORMAL HIGH (ref 8.9–10.3)
Creatinine, Ser: 1.08 mg/dL — ABNORMAL HIGH (ref 0.44–1.00)
GFR calc non Af Amer: 49 mL/min — ABNORMAL LOW (ref >60)
GFR, EST AFRICAN AMERICAN: 57 mL/min — AB (ref >60)
Glucose, Bld: 79 mg/dL (ref 65–99)
POTASSIUM: 4 mmol/L (ref 3.5–5.1)
Sodium: 137 mmol/L (ref 135–145)

## 2017-07-09 MED ORDER — PHENAZOPYRIDINE HCL 100 MG PO TABS
100.0000 mg | ORAL_TABLET | Freq: Once | ORAL | Status: AC
  Administered 2017-07-09: 100 mg via ORAL
  Filled 2017-07-09: qty 1

## 2017-07-09 MED ORDER — DEXTROSE 5 % IV SOLN
1.0000 g | Freq: Once | INTRAVENOUS | Status: AC
  Administered 2017-07-09: 1 g via INTRAVENOUS
  Filled 2017-07-09: qty 10

## 2017-07-09 MED ORDER — MORPHINE SULFATE (PF) 4 MG/ML IV SOLN
4.0000 mg | Freq: Once | INTRAVENOUS | Status: AC
  Administered 2017-07-09: 4 mg via INTRAVENOUS
  Filled 2017-07-09: qty 1

## 2017-07-09 MED ORDER — PHENAZOPYRIDINE HCL 100 MG PO TABS: 100.0000 mg | ORAL_TABLET | Freq: Two times a day (BID) | ORAL | 0 refills | Status: AC | PRN

## 2017-07-09 MED ORDER — SODIUM CHLORIDE 0.9 % IV BOLUS (SEPSIS)
500.0000 mL | Freq: Once | INTRAVENOUS | Status: AC
  Administered 2017-07-09: 500 mL via INTRAVENOUS

## 2017-07-09 NOTE — Discharge Instructions (Signed)
Drink plenty of fluids.  Get rechecked immediately if you can't pee or have new concerning symptoms.    Continue your antibiotics as prescribed by your family doctor.

## 2017-07-09 NOTE — ED Provider Notes (Signed)
[facility] EMERGENCY DEPARTMENT Provider Note   CSN: [id number] Arrival date & time: 07/09/17  1458     History   Chief Complaint Chief Complaint  Patient presents with  . Hematuria    HPI Terry Allen is a 76 y.o. female.  The history is provided by the patient. No language interpreter was used.  Hematuria     Terry Allen is a 76 y.o. female who presents to the Emergency Department complaining of dysuria and hematuria.  She reports about 1-2 weeks of pelvic pain that is sharp in nature.  Pain is waxing and waning and worse with movement as well as positioning and urination.  Over the last 2 days she has had increased pain followed by bloody urine.  She reports bright red blood in her urine.  No vaginal bleeding, no additional bleeding.  She has associated nausea and mild lower abdominal discomfort.  She saw her doctor yesterday and was given a shot of pain medication, nausea medication and an antibiotic.  She states that she is taking anabolic pills but does not know the name of them.  She presents today due to ongoing pain.  She has been trying heating pads at home with partial improvement in her pain.  Earlier this month she had a CT abdomen pelvis performed that demonstrated no aortic aneurysm or kidney stones.  No abdominal masses.  Past Medical History:  Diagnosis Date  . Acid reflux    Patient reports Barretts  . Bulging lumbar disc   . High cholesterol   . Hypothyroidism   . Nerve damage   . Osteopenia   . Pelvic pain in female    due to bladder sling    Patient Active Problem List   Diagnosis Date Noted  . GERD (gastroesophageal reflux disease) 12/13/2014  . Barrett's esophagus 12/13/2014  . Hypothyroidism 12/13/2014  . High cholesterol 12/13/2014    Past Surgical History:  Procedure Laterality Date  . APPENDECTOMY  1960  . BLADDER SUSPENSION    . ESOPHAGOGASTRODUODENOSCOPY N/A 12/21/2014   Procedure: ESOPHAGOGASTRODUODENOSCOPY (EGD);   Surgeon: Rogene Houston, MD;  [facility];  Service: Endoscopy;  Laterality: N/A;  210  . ESOPHAGOGASTRODUODENOSCOPY N/A 06/16/2015   Procedure: ESOPHAGOGASTRODUODENOSCOPY (EGD);  Surgeon: Rogene Houston, MD;  [facility]: [facility];  Service: Endoscopy;  Laterality: N/A;  1250    OB History    No data available       Home Medications    Prior to Admission medications   Medication Sig Start Date End Date Taking? Authorizing Provider  acetaminophen (TYLENOL) 500 MG tablet Take 1,000 mg by mouth every 6 (six) hours as needed.    [provider]  amitriptyline (ELAVIL) 50 MG tablet Take 50 mg by mouth at bedtime.    [provider]  aspirin 81 MG tablet Take 81 mg by mouth daily.    [provider]  calcium carbonate (OS-CAL) 600 MG TABS tablet Take 600 mg by mouth 2 (two) times daily with a meal.    [provider]  ciprofloxacin (CIPRO) 500 MG tablet Take 1 tablet (500 mg total) by mouth 2 (two) times daily. 01/28/17   Susy Frizzle, MD  gabapentin (NEURONTIN) 300 MG capsule Take 2 capsules (600 mg total) by mouth 3 (three) times daily. Patient taking differently: Take 300 mg by mouth 3 (three) times daily.  10/23/15   Susy Frizzle, MD  levothyroxine (SYNTHROID, LEVOTHROID) 100 MCG tablet Take 1 tablet (  100 mcg total) by mouth daily. 12/26/16   Susy Frizzle, MD  Multiple Vitamin (MULTIVITAMIN) tablet Take 1 tablet by mouth daily.    [provider]  naproxen sodium (ANAPROX) 220 MG tablet Take 220 mg by mouth 2 (two) times daily with a meal.    [provider]  ondansetron (ZOFRAN) 4 MG tablet Take 4 mg by mouth every 8 (eight) hours as needed for nausea or vomiting.    [provider]  pantoprazole (PROTONIX) 40 MG tablet TAKE ONE TABLET BY MOUTH ONCE DAILY 07/01/16   Susy Frizzle, MD  phenazopyridine (PYRIDIUM) 100 MG tablet Take 1 tablet (100 mg total) by mouth 2 (two) times daily as needed for  pain. 07/09/17 07/11/17  Quintella Reichert, MD  simvastatin (ZOCOR) 40 MG tablet TAKE 1 TABLET BY MOUTH ONCE DAILY 05/20/17   Susy Frizzle, MD    Family History Family History  Problem Relation Age of Onset  . Breast cancer Mother   . Heart disease Mother   . Diabetes Mother   . Cancer Mother        breast  . Stroke Mother   . Hypertension Mother   . Lung cancer Father   . Stroke Father   . Cancer Father        oat cell cancer/lung  . Kidney cancer Brother   . Cancer Brother        renal cell cancer  . Healthy Brother   . Diabetes Son     Social History Social History  Substance Use Topics  . Smoking status: Never Smoker  . Smokeless tobacco: Never Used  . Alcohol use No     Allergies   Patient has no known allergies.   Review of Systems Review of Systems  Genitourinary: Positive for hematuria.  All other systems reviewed and are negative.    Physical Exam Updated Vital Signs BP (!) 145/76 (BP Location: Left Arm)   Pulse (!) 102   Temp 98.4 F (36.9 C) (Oral)   Resp 18   Ht 5\' 2"  (1.575 m)   Wt 54.4 kg (120 lb)   SpO2 99%   BMI 21.95 kg/m   Physical Exam  Constitutional: She is oriented to person, place, and time. She appears well-developed and well-nourished.  HENT:  Head: Normocephalic and atraumatic.  Cardiovascular: Normal rate and regular rhythm.   No murmur heard. Pulmonary/Chest: Effort normal and breath sounds normal. No respiratory distress.  Abdominal: Soft. There is no rebound and no guarding.  Mild suprapubic discomfort  Genitourinary:  Genitourinary Comments: External pelvic exam with no vulvar erythema or edema.  No frank blood.  Musculoskeletal: She exhibits no edema or tenderness.  Neurological: She is alert and oriented to person, place, and time.  Skin: Skin is warm and dry.  Psychiatric: She has a normal mood and affect. Her behavior is normal.  Nursing note and vitals reviewed.    ED Treatments / Results  Labs (all  labs ordered are listed, but only abnormal results are displayed) Labs Reviewed  URINALYSIS, ROUTINE W REFLEX MICROSCOPIC - Abnormal; Notable for the following:       Result Value   APPearance TURBID (*)    Hgb urine dipstick LARGE (*)    Protein, ur >=300 (*)    Leukocytes, UA MODERATE (*)    Bacteria, UA RARE (*)    Squamous Epithelial / LPF 0-5 (*)    Non Squamous Epithelial 0-5 (*)    All other components  within normal limits  BASIC METABOLIC PANEL - Abnormal; Notable for the following:    Chloride 95 (*)    Creatinine, Ser 1.08 (*)    Calcium 10.7 (*)    GFR calc non Af Amer 49 (*)    GFR calc Af Amer 57 (*)    All other components within normal limits  CBC WITH DIFFERENTIAL/PLATELET - Abnormal; Notable for the following:    Hemoglobin 15.1 (*)    All other components within normal limits  URINE CULTURE    EKG  EKG Interpretation None       Radiology No results found.  Procedures Procedures (including critical care time)  Medications Ordered in ED Medications  sodium chloride 0.9 % bolus 500 mL (0 mLs Intravenous Stopped 07/09/17 1746)  morphine 4 MG/ML injection 4 mg (4 mg Intravenous Given 07/09/17 1611)  cefTRIAXone (ROCEPHIN) 1 g in dextrose 5 % 50 mL IVPB (0 g Intravenous Stopped 07/09/17 1816)  phenazopyridine (PYRIDIUM) tablet 100 mg (100 mg Oral Given 07/09/17 1745)     Initial Impression / Assessment and Plan / ED Course  I have reviewed the triage vital signs and the nursing notes.  Pertinent labs & imaging results that were available during my care of the patient were reviewed by me and considered in my medical decision making (see chart for details).     Pt here for pelvic pain, dysuria.  UA is c/w UTI.  Pt is nontoxic on exam with no peritoneal findings.  Reviewed records and recent CT scan.  Presentation is not c/w renal colic, AAA, dissection.  D/w pt home care for UTI and importance of urology as well as PCP follow up.   Final Clinical  Impressions(s) / ED Diagnoses   Final diagnoses:  Hemorrhagic cystitis  Dehydration    New Prescriptions Discharge Medication List as of 07/09/2017  5:11 PM    START taking these medications   Details  phenazopyridine (PYRIDIUM) 100 MG tablet Take 1 tablet (100 mg total) by mouth 2 (two) times daily as needed for pain., Starting Wed 07/09/2017, Until Fri 07/11/2017, Print         Quintella Reichert, MD 07/10/17 0021

## 2017-07-09 NOTE — ED Triage Notes (Signed)
Pt reports hematuria and pelvic pain for last several weeks. Pt reports consulted PCP and was told to come here. Pt denies being on blood thinner.

## 2017-07-11 ENCOUNTER — Encounter (INDEPENDENT_AMBULATORY_CARE_PROVIDER_SITE_OTHER): Payer: Self-pay | Admitting: Internal Medicine

## 2017-07-11 LAB — URINE CULTURE: CULTURE: NO GROWTH

## 2017-07-11 NOTE — Progress Notes (Signed)
Patient was given an appointment for 10/13/17 at 1:45pm. A letter was mailed to the patient.

## 2017-07-17 DIAGNOSIS — N390 Urinary tract infection, site not specified: Secondary | ICD-10-CM | POA: Diagnosis not present

## 2017-07-31 DIAGNOSIS — R102 Pelvic and perineal pain: Secondary | ICD-10-CM | POA: Diagnosis not present

## 2017-07-31 DIAGNOSIS — K2270 Barrett's esophagus without dysplasia: Secondary | ICD-10-CM | POA: Diagnosis not present

## 2017-07-31 DIAGNOSIS — N390 Urinary tract infection, site not specified: Secondary | ICD-10-CM | POA: Diagnosis not present

## 2017-07-31 DIAGNOSIS — E039 Hypothyroidism, unspecified: Secondary | ICD-10-CM | POA: Diagnosis not present

## 2017-07-31 DIAGNOSIS — R300 Dysuria: Secondary | ICD-10-CM | POA: Diagnosis not present

## 2017-07-31 DIAGNOSIS — K210 Gastro-esophageal reflux disease with esophagitis: Secondary | ICD-10-CM | POA: Diagnosis not present

## 2017-08-15 DIAGNOSIS — N3941 Urge incontinence: Secondary | ICD-10-CM | POA: Diagnosis not present

## 2017-08-15 DIAGNOSIS — N3020 Other chronic cystitis without hematuria: Secondary | ICD-10-CM | POA: Diagnosis not present

## 2017-09-10 ENCOUNTER — Other Ambulatory Visit: Payer: Self-pay | Admitting: Family Medicine

## 2017-10-03 ENCOUNTER — Ambulatory Visit (HOSPITAL_COMMUNITY)
Admission: RE | Admit: 2017-10-03 | Discharge: 2017-10-03 | Disposition: A | Discharge status: Home / Self Care | Payer: PPO | Source: Ambulatory Visit | Attending: Internal Medicine | Admitting: Internal Medicine

## 2017-10-03 DIAGNOSIS — Z1231 Encounter for screening mammogram for malignant neoplasm of breast: Secondary | ICD-10-CM

## 2017-10-13 ENCOUNTER — Encounter (INDEPENDENT_AMBULATORY_CARE_PROVIDER_SITE_OTHER): Payer: Self-pay | Admitting: Internal Medicine

## 2017-10-13 ENCOUNTER — Ambulatory Visit (INDEPENDENT_AMBULATORY_CARE_PROVIDER_SITE_OTHER): Payer: PPO | Admitting: Internal Medicine

## 2017-10-13 VITALS — BP 120/84 | HR 72 | Temp 98.1°F | Ht 62.0 in | Wt 121.0 lb

## 2017-10-13 DIAGNOSIS — R110 Nausea: Secondary | ICD-10-CM

## 2017-10-13 NOTE — Patient Instructions (Signed)
Continue the medication. OV in 1 year.

## 2017-10-13 NOTE — Progress Notes (Signed)
Subjective:    Patient ID: Terry Allen, female    DOB: October 17, 1940, 77 y.o.   MRN: [id number] Ht 62, Wt 120 06/30/2017 HPI Here today for f/u. Hx of chronic nausea x 3 months. At [place] in October she stated she had had nausea for year. She tells me today she has been seen by [facility]. New hx of hematuria/UTI.   She tells me she is doing good. Her appetite has rmained good. She has maintained her weight. BMs are normal.  NO melena or BRRB.  Flex sigmoidoscopy in October of 2013 by Dr. Ruthell Rummage was normal.   Underwent a CT abodmen/pelvis with CM in October (generalized abdominal pain and nausea): IMPRESSION: Mild sigmoid diverticulosis. No radiographic evidence of diverticulitis or other acute findings,   Last EGD in October of 2016: chnroic GERD, Short segment Barretts, with 3 months hx of epigastric pain associated with post prandial nausea, wt loss:   Impression: No evidence of peptic ulcer disease or gastritis. Wavy GE junction with tiny islands of salmon colored mucosa secondary to short segment Barrett's esophagus documented previously.  07/02/2017 HIDA scan: EF 55% Normal.  When results given to patient for the HIDA scan she stated her symptoms had resolved.  Review of Systems Past Medical History:  Diagnosis Date  . Acid reflux    Patient reports Barretts  . Bulging lumbar disc   . High cholesterol   . Hypothyroidism   . Nerve damage   . Osteopenia   . Pelvic pain in female    due to bladder sling    Past Surgical History:  Procedure Laterality Date  . APPENDECTOMY  1960  . BLADDER SUSPENSION    . ESOPHAGOGASTRODUODENOSCOPY N/A 12/21/2014   Procedure: ESOPHAGOGASTRODUODENOSCOPY (EGD);  Surgeon: Rogene Houston, MD;  [facility];  Service: Endoscopy;  Laterality: N/A;  210  . ESOPHAGOGASTRODUODENOSCOPY N/A 06/16/2015   Procedure: ESOPHAGOGASTRODUODENOSCOPY (EGD);  Surgeon: Rogene Houston, MD;  [facility];  Service: Endoscopy;   Laterality: N/A;  1250    No Known Allergies  Current Outpatient Medications on File Prior to Visit  Medication Sig Dispense Refill  . acetaminophen (TYLENOL) 500 MG tablet Take 1,000 mg by mouth every 6 (six) hours as needed.    Marland Kitchen amitriptyline (ELAVIL) 50 MG tablet Take 50 mg by mouth at bedtime.    Marland Kitchen aspirin 81 MG tablet Take 81 mg by mouth daily.    . calcium carbonate (OS-CAL) 600 MG TABS tablet Take 600 mg by mouth 2 (two) times daily with a meal.    . ciprofloxacin (CIPRO) 500 MG tablet Take 1 tablet (500 mg total) by mouth 2 (two) times daily. 10 tablet 0  . gabapentin (NEURONTIN) 300 MG capsule Take 2 capsules (600 mg total) by mouth 3 (three) times daily. (Patient taking differently: Take 300 mg by mouth 3 (three) times daily. ) 540 capsule 4  . levothyroxine (SYNTHROID, LEVOTHROID) 100 MCG tablet Take 1 tablet (100 mcg total) by mouth daily. 90 tablet 3  . Multiple Vitamin (MULTIVITAMIN) tablet Take 1 tablet by mouth daily.    . naproxen sodium (ANAPROX) 220 MG tablet Take 220 mg by mouth 2 (two) times daily with a meal.    . ondansetron (ZOFRAN) 4 MG tablet Take 4 mg by mouth every 8 (eight) hours as needed for nausea or vomiting.    . pantoprazole (PROTONIX) 40 MG tablet TAKE ONE TABLET BY MOUTH ONCE DAILY 30 tablet 3  . simvastatin (ZOCOR) 40  MG tablet TAKE 1 TABLET BY MOUTH ONCE DAILY 90 tablet 0   No current facility-administered medications on file prior to visit.         Objective:   Physical Exam Blood pressure 120/84, pulse 72, temperature 98.1 F (36.7 C), height 5\' 2"  (1.575 m), weight 121 lb (54.9 kg). Alert and oriented. Skin warm and dry. Oral mucosa is moist.   . Sclera anicteric, conjunctivae is pink. Thyroid not enlarged. No cervical lymphadenopathy. Lungs clear. Heart regular rate and rhythm.  Abdomen is soft. Bowel sounds are positive. No hepatomegaly. No abdominal masses felt. No tenderness.  No edema to lower extremities.          Assessment & Plan:    Nausea. She is much better. She has maintained her weight. She will follow up in one year.

## 2017-11-13 DIAGNOSIS — N3941 Urge incontinence: Secondary | ICD-10-CM | POA: Diagnosis not present

## 2017-11-13 DIAGNOSIS — N3020 Other chronic cystitis without hematuria: Secondary | ICD-10-CM | POA: Diagnosis not present

## 2017-12-22 DIAGNOSIS — R102 Pelvic and perineal pain: Secondary | ICD-10-CM | POA: Diagnosis not present

## 2017-12-22 DIAGNOSIS — L259 Unspecified contact dermatitis, unspecified cause: Secondary | ICD-10-CM | POA: Diagnosis not present

## 2017-12-22 DIAGNOSIS — A848 Other tick-borne viral encephalitis: Secondary | ICD-10-CM | POA: Diagnosis not present

## 2017-12-22 DIAGNOSIS — Z6821 Body mass index (BMI) 21.0-21.9, adult: Secondary | ICD-10-CM | POA: Diagnosis not present

## 2017-12-22 DIAGNOSIS — N390 Urinary tract infection, site not specified: Secondary | ICD-10-CM | POA: Diagnosis not present

## 2018-01-05 DIAGNOSIS — R110 Nausea: Secondary | ICD-10-CM | POA: Diagnosis not present

## 2018-01-05 DIAGNOSIS — R102 Pelvic and perineal pain: Secondary | ICD-10-CM | POA: Diagnosis not present

## 2018-01-05 DIAGNOSIS — N390 Urinary tract infection, site not specified: Secondary | ICD-10-CM | POA: Diagnosis not present

## 2018-01-05 DIAGNOSIS — Z6821 Body mass index (BMI) 21.0-21.9, adult: Secondary | ICD-10-CM | POA: Diagnosis not present

## 2018-01-05 DIAGNOSIS — L259 Unspecified contact dermatitis, unspecified cause: Secondary | ICD-10-CM | POA: Diagnosis not present

## 2018-01-05 DIAGNOSIS — R1013 Epigastric pain: Secondary | ICD-10-CM | POA: Diagnosis not present

## 2018-01-12 ENCOUNTER — Other Ambulatory Visit (HOSPITAL_COMMUNITY): Payer: Self-pay | Admitting: Adult Health Nurse Practitioner

## 2018-01-12 ENCOUNTER — Ambulatory Visit (HOSPITAL_COMMUNITY)
Admission: RE | Admit: 2018-01-12 | Discharge: 2018-01-12 | Disposition: A | Discharge status: Home / Self Care | Payer: PPO | Source: Ambulatory Visit | Attending: Adult Health Nurse Practitioner | Admitting: Adult Health Nurse Practitioner

## 2018-01-12 DIAGNOSIS — M419 Scoliosis, unspecified: Secondary | ICD-10-CM | POA: Insufficient documentation

## 2018-01-12 DIAGNOSIS — M4316 Spondylolisthesis, lumbar region: Secondary | ICD-10-CM | POA: Insufficient documentation

## 2018-01-12 DIAGNOSIS — M1288 Other specific arthropathies, not elsewhere classified, other specified site: Secondary | ICD-10-CM | POA: Insufficient documentation

## 2018-01-12 DIAGNOSIS — M5136 Other intervertebral disc degeneration, lumbar region: Secondary | ICD-10-CM | POA: Diagnosis not present

## 2018-01-12 DIAGNOSIS — M545 Low back pain: Secondary | ICD-10-CM

## 2018-01-12 DIAGNOSIS — R1013 Epigastric pain: Secondary | ICD-10-CM | POA: Diagnosis not present

## 2018-01-12 DIAGNOSIS — R110 Nausea: Secondary | ICD-10-CM | POA: Diagnosis not present

## 2018-01-12 DIAGNOSIS — Z6822 Body mass index (BMI) 22.0-22.9, adult: Secondary | ICD-10-CM | POA: Diagnosis not present

## 2018-02-03 DIAGNOSIS — R1013 Epigastric pain: Secondary | ICD-10-CM | POA: Diagnosis not present

## 2018-02-03 DIAGNOSIS — Z6822 Body mass index (BMI) 22.0-22.9, adult: Secondary | ICD-10-CM | POA: Diagnosis not present

## 2018-02-03 DIAGNOSIS — R102 Pelvic and perineal pain: Secondary | ICD-10-CM | POA: Diagnosis not present

## 2018-02-03 DIAGNOSIS — R51 Headache: Secondary | ICD-10-CM | POA: Diagnosis not present

## 2018-02-03 DIAGNOSIS — R110 Nausea: Secondary | ICD-10-CM | POA: Diagnosis not present

## 2018-02-03 DIAGNOSIS — L259 Unspecified contact dermatitis, unspecified cause: Secondary | ICD-10-CM | POA: Diagnosis not present

## 2018-02-03 DIAGNOSIS — N390 Urinary tract infection, site not specified: Secondary | ICD-10-CM | POA: Diagnosis not present

## 2018-02-03 DIAGNOSIS — Z6821 Body mass index (BMI) 21.0-21.9, adult: Secondary | ICD-10-CM | POA: Diagnosis not present

## 2018-02-03 DIAGNOSIS — M545 Low back pain: Secondary | ICD-10-CM | POA: Diagnosis not present

## 2018-02-12 DIAGNOSIS — N3020 Other chronic cystitis without hematuria: Secondary | ICD-10-CM | POA: Diagnosis not present

## 2018-02-12 DIAGNOSIS — N3941 Urge incontinence: Secondary | ICD-10-CM | POA: Diagnosis not present

## 2018-02-24 DIAGNOSIS — R102 Pelvic and perineal pain: Secondary | ICD-10-CM | POA: Diagnosis not present

## 2018-03-03 DIAGNOSIS — R102 Pelvic and perineal pain: Secondary | ICD-10-CM | POA: Diagnosis not present

## 2018-03-10 DIAGNOSIS — K2270 Barrett's esophagus without dysplasia: Secondary | ICD-10-CM | POA: Diagnosis not present

## 2018-03-10 DIAGNOSIS — K5900 Constipation, unspecified: Secondary | ICD-10-CM | POA: Diagnosis not present

## 2018-03-10 DIAGNOSIS — N3020 Other chronic cystitis without hematuria: Secondary | ICD-10-CM | POA: Diagnosis not present

## 2018-03-10 DIAGNOSIS — Z6821 Body mass index (BMI) 21.0-21.9, adult: Secondary | ICD-10-CM | POA: Diagnosis not present

## 2018-03-10 DIAGNOSIS — R102 Pelvic and perineal pain: Secondary | ICD-10-CM | POA: Diagnosis not present

## 2018-04-01 DIAGNOSIS — K219 Gastro-esophageal reflux disease without esophagitis: Secondary | ICD-10-CM | POA: Diagnosis not present

## 2018-04-01 DIAGNOSIS — N3020 Other chronic cystitis without hematuria: Secondary | ICD-10-CM | POA: Diagnosis not present

## 2018-04-01 DIAGNOSIS — E039 Hypothyroidism, unspecified: Secondary | ICD-10-CM | POA: Diagnosis not present

## 2018-04-01 DIAGNOSIS — E785 Hyperlipidemia, unspecified: Secondary | ICD-10-CM | POA: Diagnosis not present

## 2018-04-06 DIAGNOSIS — K5900 Constipation, unspecified: Secondary | ICD-10-CM | POA: Diagnosis not present

## 2018-04-06 DIAGNOSIS — N3020 Other chronic cystitis without hematuria: Secondary | ICD-10-CM | POA: Diagnosis not present

## 2018-04-06 DIAGNOSIS — Z6821 Body mass index (BMI) 21.0-21.9, adult: Secondary | ICD-10-CM | POA: Diagnosis not present

## 2018-04-06 DIAGNOSIS — R0789 Other chest pain: Secondary | ICD-10-CM | POA: Diagnosis not present

## 2018-04-06 DIAGNOSIS — N390 Urinary tract infection, site not specified: Secondary | ICD-10-CM | POA: Diagnosis not present

## 2018-04-06 DIAGNOSIS — E039 Hypothyroidism, unspecified: Secondary | ICD-10-CM | POA: Diagnosis not present

## 2018-04-06 DIAGNOSIS — R51 Headache: Secondary | ICD-10-CM | POA: Diagnosis not present

## 2018-04-06 DIAGNOSIS — R110 Nausea: Secondary | ICD-10-CM | POA: Diagnosis not present

## 2018-04-06 DIAGNOSIS — Z6820 Body mass index (BMI) 20.0-20.9, adult: Secondary | ICD-10-CM | POA: Diagnosis not present

## 2018-04-06 DIAGNOSIS — M545 Low back pain: Secondary | ICD-10-CM | POA: Diagnosis not present

## 2018-04-06 DIAGNOSIS — R1013 Epigastric pain: Secondary | ICD-10-CM | POA: Diagnosis not present

## 2018-04-06 DIAGNOSIS — L259 Unspecified contact dermatitis, unspecified cause: Secondary | ICD-10-CM | POA: Diagnosis not present

## 2018-04-06 DIAGNOSIS — K2270 Barrett's esophagus without dysplasia: Secondary | ICD-10-CM | POA: Diagnosis not present

## 2018-04-06 DIAGNOSIS — R102 Pelvic and perineal pain: Secondary | ICD-10-CM | POA: Diagnosis not present

## 2018-04-06 DIAGNOSIS — Z6822 Body mass index (BMI) 22.0-22.9, adult: Secondary | ICD-10-CM | POA: Diagnosis not present

## 2018-05-08 ENCOUNTER — Other Ambulatory Visit: Payer: Self-pay | Admitting: Internal Medicine

## 2018-05-08 DIAGNOSIS — L259 Unspecified contact dermatitis, unspecified cause: Secondary | ICD-10-CM | POA: Diagnosis not present

## 2018-05-08 DIAGNOSIS — M545 Low back pain: Secondary | ICD-10-CM | POA: Diagnosis not present

## 2018-05-08 DIAGNOSIS — Z6821 Body mass index (BMI) 21.0-21.9, adult: Secondary | ICD-10-CM | POA: Diagnosis not present

## 2018-05-08 DIAGNOSIS — R1310 Dysphagia, unspecified: Secondary | ICD-10-CM

## 2018-05-08 DIAGNOSIS — N3020 Other chronic cystitis without hematuria: Secondary | ICD-10-CM | POA: Diagnosis not present

## 2018-05-08 DIAGNOSIS — N390 Urinary tract infection, site not specified: Secondary | ICD-10-CM | POA: Diagnosis not present

## 2018-05-08 DIAGNOSIS — Z6822 Body mass index (BMI) 22.0-22.9, adult: Secondary | ICD-10-CM | POA: Diagnosis not present

## 2018-05-08 DIAGNOSIS — R0789 Other chest pain: Secondary | ICD-10-CM | POA: Diagnosis not present

## 2018-05-08 DIAGNOSIS — E039 Hypothyroidism, unspecified: Secondary | ICD-10-CM | POA: Diagnosis not present

## 2018-05-08 DIAGNOSIS — Z6820 Body mass index (BMI) 20.0-20.9, adult: Secondary | ICD-10-CM | POA: Diagnosis not present

## 2018-05-08 DIAGNOSIS — K5900 Constipation, unspecified: Secondary | ICD-10-CM | POA: Diagnosis not present

## 2018-05-08 DIAGNOSIS — R110 Nausea: Secondary | ICD-10-CM | POA: Diagnosis not present

## 2018-05-08 DIAGNOSIS — K2270 Barrett's esophagus without dysplasia: Secondary | ICD-10-CM | POA: Diagnosis not present

## 2018-05-15 ENCOUNTER — Ambulatory Visit (HOSPITAL_COMMUNITY)
Admission: RE | Admit: 2018-05-15 | Discharge: 2018-05-15 | Disposition: A | Discharge status: Home / Self Care | Payer: PPO | Source: Ambulatory Visit | Attending: Internal Medicine | Admitting: Internal Medicine

## 2018-05-15 DIAGNOSIS — K2270 Barrett's esophagus without dysplasia: Secondary | ICD-10-CM | POA: Diagnosis not present

## 2018-05-15 DIAGNOSIS — R1310 Dysphagia, unspecified: Secondary | ICD-10-CM | POA: Diagnosis not present

## 2018-05-15 DIAGNOSIS — E785 Hyperlipidemia, unspecified: Secondary | ICD-10-CM | POA: Diagnosis not present

## 2018-05-15 DIAGNOSIS — R05 Cough: Secondary | ICD-10-CM | POA: Diagnosis not present

## 2018-05-15 DIAGNOSIS — R1013 Epigastric pain: Secondary | ICD-10-CM | POA: Diagnosis not present

## 2018-05-15 DIAGNOSIS — E039 Hypothyroidism, unspecified: Secondary | ICD-10-CM | POA: Diagnosis not present

## 2018-05-15 DIAGNOSIS — K222 Esophageal obstruction: Secondary | ICD-10-CM | POA: Insufficient documentation

## 2018-05-15 DIAGNOSIS — M545 Low back pain: Secondary | ICD-10-CM | POA: Diagnosis not present

## 2018-05-15 DIAGNOSIS — N3020 Other chronic cystitis without hematuria: Secondary | ICD-10-CM | POA: Diagnosis not present

## 2018-05-20 ENCOUNTER — Encounter (INDEPENDENT_AMBULATORY_CARE_PROVIDER_SITE_OTHER): Payer: Self-pay | Admitting: *Deleted

## 2018-05-20 ENCOUNTER — Encounter (INDEPENDENT_AMBULATORY_CARE_PROVIDER_SITE_OTHER): Payer: Self-pay | Admitting: Internal Medicine

## 2018-05-20 ENCOUNTER — Ambulatory Visit (INDEPENDENT_AMBULATORY_CARE_PROVIDER_SITE_OTHER): Payer: PPO | Admitting: Internal Medicine

## 2018-05-20 VITALS — BP 132/72 | HR 76 | Temp 97.9°F | Ht 62.0 in | Wt 113.6 lb

## 2018-05-20 DIAGNOSIS — R1310 Dysphagia, unspecified: Secondary | ICD-10-CM | POA: Insufficient documentation

## 2018-05-20 DIAGNOSIS — R1319 Other dysphagia: Secondary | ICD-10-CM

## 2018-05-20 NOTE — Progress Notes (Signed)
Subjective:    Patient ID: Terry Allen, female    DOB: 1941/01/23, 77 y.o.   MRN: [id number]  HPI Referred by Dr. Allyn Kenner for esophageal stricture. Last seen in February of this year. Wt in February 121. She tells me she is having trouble swallowing pills. Dysphagia x 6 weeks.  No problems with solid foods. Her appetite is okay. She has lost from 121 to 113.6. GERD controlled with Protonix.  Her BMs move okay. No prior hx of dysphagia.  Underwent a DG esophagram 05/15/2018 which revealed. 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  Last EGD in October of 2016: chnroic GERD, Short segment Barretts, with 3 months hx of epigastric pain associated with post prandial nausea, wt loss:  Impression: No evidence of peptic ulcer disease or gastritis. Wavy GE junction with tiny islands of salmon colored mucosa secondary to short segment Barrett's esophagus documented previously.  Review of Systems  Past Medical History:  Diagnosis Date  . Acid reflux    Patient reports Barretts  . Bulging lumbar disc   . High cholesterol   . Hypothyroidism   . Nerve damage   . Osteopenia   . Pelvic pain in female    due to bladder sling    Past Surgical History:  Procedure Laterality Date  . APPENDECTOMY  1960  . BLADDER SUSPENSION    . ESOPHAGOGASTRODUODENOSCOPY N/A 12/21/2014   Procedure: ESOPHAGOGASTRODUODENOSCOPY (EGD);  Surgeon: Rogene Houston, MD;  [facility];  Service: Endoscopy;  Laterality: N/A;  210  . ESOPHAGOGASTRODUODENOSCOPY N/A 06/16/2015   Procedure: ESOPHAGOGASTRODUODENOSCOPY (EGD);  Surgeon: Rogene Houston, MD;  [facility];  Service: Endoscopy;  Laterality: N/A;  1250    No Known Allergies  Current Outpatient Medications on File Prior to Visit  Medication Sig Dispense Refill  . acetaminophen (TYLENOL) 500  MG tablet Take 1,000 mg by mouth every 6 (six) hours as needed.    Marland Kitchen amitriptyline (ELAVIL) 50 MG tablet Take 50 mg by mouth at bedtime.    . calcium carbonate (OS-CAL) 600 MG TABS tablet Take 600 mg by mouth 2 (two) times daily with a meal.    . gabapentin (NEURONTIN) 300 MG capsule Take 2 capsules (600 mg total) by mouth 3 (three) times daily. (Patient taking differently: Take 300 mg by mouth 3 (three) times daily. ) 540 capsule 4  . levothyroxine (SYNTHROID, LEVOTHROID) 100 MCG tablet Take 1 tablet (100 mcg total) by mouth daily. 90 tablet 3  . Multiple Vitamin (MULTIVITAMIN) tablet Take 1 tablet by mouth daily.    . pantoprazole (PROTONIX) 40 MG tablet TAKE ONE TABLET BY MOUTH ONCE DAILY 30 tablet 3  . simvastatin (ZOCOR) 40 MG tablet TAKE 1 TABLET BY MOUTH ONCE DAILY 90 tablet 0  . tolterodine (DETROL LA) 4 MG 24 hr capsule Take 4 mg by mouth daily.     No current facility-administered medications on file prior to visit.         Objective:   Physical Exam Blood pressure 132/72, pulse 76, temperature 97.9 F (36.6 C), height 5\' 2"  (1.575 m), weight 113 lb 9.6 oz (51.5 kg). Alert and oriented. Skin warm and dry. Oral mucosa is moist.   . Sclera anicteric, conjunctivae is pink. Thyroid not enlarged. No cervical lymphadenopathy. Lungs clear. Heart regular rate and rhythm.  Abdomen is soft. Bowel sounds are positive. No  hepatomegaly. No abdominal masses felt. No tenderness.  No edema to lower extremities.           Assessment & Plan:  Pill dysphagia. Esophageal Web. EGD/ED. The risks of bleeding, perforation and infection were reviewed with patient.

## 2018-05-20 NOTE — Patient Instructions (Signed)
EGD/ED. The risks of bleeding, perforation and infection were reviewed with patient.  

## 2018-05-27 ENCOUNTER — Ambulatory Visit (HOSPITAL_COMMUNITY)
Admission: RE | Admit: 2018-05-27 | Discharge: 2018-05-27 | Disposition: A | Discharge status: Home / Self Care | Payer: PPO | Source: Ambulatory Visit | Attending: Internal Medicine | Admitting: Internal Medicine

## 2018-05-27 ENCOUNTER — Other Ambulatory Visit: Payer: Self-pay

## 2018-05-27 ENCOUNTER — Encounter (HOSPITAL_COMMUNITY): Payer: Self-pay | Admitting: *Deleted

## 2018-05-27 ENCOUNTER — Encounter (HOSPITAL_COMMUNITY)
Admission: RE | Disposition: A | Discharge status: Home / Self Care | Payer: Self-pay | Source: Ambulatory Visit | Attending: Internal Medicine

## 2018-05-27 DIAGNOSIS — Z79899 Other long term (current) drug therapy: Secondary | ICD-10-CM | POA: Insufficient documentation

## 2018-05-27 DIAGNOSIS — E039 Hypothyroidism, unspecified: Secondary | ICD-10-CM | POA: Diagnosis not present

## 2018-05-27 DIAGNOSIS — E7800 Pure hypercholesterolemia, unspecified: Secondary | ICD-10-CM | POA: Insufficient documentation

## 2018-05-27 DIAGNOSIS — R1310 Dysphagia, unspecified: Secondary | ICD-10-CM | POA: Insufficient documentation

## 2018-05-27 DIAGNOSIS — Z791 Long term (current) use of non-steroidal anti-inflammatories (NSAID): Secondary | ICD-10-CM | POA: Insufficient documentation

## 2018-05-27 DIAGNOSIS — Q394 Esophageal web: Secondary | ICD-10-CM | POA: Insufficient documentation

## 2018-05-27 DIAGNOSIS — Z79890 Hormone replacement therapy (postmenopausal): Secondary | ICD-10-CM | POA: Diagnosis not present

## 2018-05-27 DIAGNOSIS — K219 Gastro-esophageal reflux disease without esophagitis: Secondary | ICD-10-CM | POA: Diagnosis not present

## 2018-05-27 DIAGNOSIS — R102 Pelvic and perineal pain: Secondary | ICD-10-CM | POA: Diagnosis not present

## 2018-05-27 DIAGNOSIS — R1319 Other dysphagia: Secondary | ICD-10-CM

## 2018-05-27 DIAGNOSIS — R1314 Dysphagia, pharyngoesophageal phase: Secondary | ICD-10-CM | POA: Diagnosis not present

## 2018-05-27 DIAGNOSIS — K228 Other specified diseases of esophagus: Secondary | ICD-10-CM | POA: Diagnosis not present

## 2018-05-27 HISTORY — PX: ESOPHAGEAL DILATION: SHX303

## 2018-05-27 HISTORY — PX: ESOPHAGOGASTRODUODENOSCOPY: SHX5428

## 2018-05-27 SURGERY — EGD (ESOPHAGOGASTRODUODENOSCOPY)
Anesthesia: Moderate Sedation

## 2018-05-27 MED ORDER — MIDAZOLAM HCL 5 MG/5ML IJ SOLN
INTRAMUSCULAR | Status: AC
  Filled 2018-05-27: qty 10

## 2018-05-27 MED ORDER — HYDROCODONE-ACETAMINOPHEN 7.5-325 MG/15ML PO SOLN: 10.0000 mL | Freq: Four times a day (QID) | ORAL | 0 refills | Status: AC | PRN

## 2018-05-27 MED ORDER — MEPERIDINE HCL 50 MG/ML IJ SOLN
INTRAMUSCULAR | Status: DC | PRN
  Administered 2018-05-27: 25 mg via INTRAVENOUS

## 2018-05-27 MED ORDER — MIDAZOLAM HCL 5 MG/5ML IJ SOLN
INTRAMUSCULAR | Status: DC | PRN
  Administered 2018-05-27: 1 mg via INTRAVENOUS
  Administered 2018-05-27: 2 mg via INTRAVENOUS

## 2018-05-27 MED ORDER — SODIUM CHLORIDE 0.9 % IV SOLN
INTRAVENOUS | Status: DC
  Administered 2018-05-27: 07:00:00 via INTRAVENOUS

## 2018-05-27 MED ORDER — MEPERIDINE HCL 50 MG/ML IJ SOLN
INTRAMUSCULAR | Status: AC
  Filled 2018-05-27: qty 1

## 2018-05-27 MED ORDER — STERILE WATER FOR IRRIGATION IR SOLN
Status: DC | PRN
  Administered 2018-05-27: 100 mL

## 2018-05-27 MED ORDER — LIDOCAINE VISCOUS HCL 2 % MT SOLN
OROMUCOSAL | Status: AC
  Filled 2018-05-27: qty 15

## 2018-05-27 NOTE — H&P (Signed)
Terry Allen is an 77 y.o. female.   Chief Complaint: Patient is here for EGD and ED. HPI: Patient is 77 year old Caucasian female who has chronic GERD who presents with 2 to 62-month history of dysphagia to solids.  She points to upper sternal area as site of bolus obstruction.  She had barium study recently and periumbilical got obstructed at the level of the proximal esophagus where she was felt to have esophageal valve.  She complains of anorexia and weight loss.  She has lost 8 pounds since February.  She says heartburn is well controlled with therapy.  She denies abdominal pain melena or rectal bleeding.  She is on meloxicam daily for pelvic pain and she is also taking Aleve on as-needed basis. She has had 2 EGDs in 2016 but the esophagus was not dilated.  Past Medical History:  Diagnosis Date  . Acid reflux    Biopsy neg for Barretts.  . Bulging lumbar disc   . High cholesterol   . Hypothyroidism   . Nerve damage   . Osteopenia   . Pelvic pain in female    due to bladder sling    Past Surgical History:  Procedure Laterality Date  . APPENDECTOMY  1960  . BLADDER SUSPENSION    . ESOPHAGOGASTRODUODENOSCOPY N/A 12/21/2014   Procedure: ESOPHAGOGASTRODUODENOSCOPY (EGD);  Surgeon: Rogene Houston, MD;  Location: [facility];  Service: Endoscopy;  Laterality: N/A;  210  . ESOPHAGOGASTRODUODENOSCOPY N/A 06/16/2015   Procedure: ESOPHAGOGASTRODUODENOSCOPY (EGD);  Surgeon: Rogene Houston, MD;  Location: [facility];  Service: Endoscopy;  Laterality: N/A;  1250    Family History  Problem Relation Age of Onset  . Breast cancer Mother   . Heart disease Mother   . Diabetes Mother   . Cancer Mother        breast  . Stroke Mother   . Hypertension Mother   . Lung cancer Father   . Stroke Father   . Cancer Father        oat cell cancer/lung  . Kidney cancer Brother   . Cancer Brother        renal cell cancer  . Healthy Brother   . Diabetes Son    Social History:  reports  that she has never smoked. She has never used smokeless tobacco. She reports that she does not drink alcohol or use drugs.  Allergies: No Known Allergies  Medications Prior to Admission  Medication Sig Dispense Refill  . acetaminophen (TYLENOL) 500 MG tablet Take 1,000 mg by mouth every 6 (six) hours as needed (for pain.).     Marland Kitchen amitriptyline (ELAVIL) 25 MG tablet Take 25 mg by mouth at bedtime.  2  . gabapentin (NEURONTIN) 300 MG capsule Take 2 capsules (600 mg total) by mouth 3 (three) times daily. (Patient taking differently: Take 300 mg by mouth 3 (three) times daily. Morning, evening, & night.) 540 capsule 4  . levothyroxine (SYNTHROID, LEVOTHROID) 50 MCG tablet Take 50 mcg by mouth daily before breakfast.  4  . meloxicam (MOBIC) 15 MG tablet Take 15 mg by mouth daily.  5  . Multiple Vitamin (MULTIVITAMIN WITH MINERALS) TABS tablet Take 1 tablet by mouth daily. One-A-Day    . naproxen sodium (ALEVE) 220 MG tablet Take 220 mg by mouth 2 (two) times daily as needed (for pain.).    Marland Kitchen pantoprazole (PROTONIX) 40 MG tablet TAKE ONE TABLET BY MOUTH ONCE DAILY (Patient taking differently: Take 40 mg by mouth at bedtime. )  30 tablet 3  . simvastatin (ZOCOR) 40 MG tablet TAKE 1 TABLET BY MOUTH ONCE DAILY (Patient taking differently: Take 40 mg by mouth every evening. ) 90 tablet 0  . tolterodine (DETROL LA) 4 MG 24 hr capsule Take 4 mg by mouth daily.      No results found for this or any previous visit (from the past 48 hour(s)). No results found.  ROS  Blood pressure (!) 117/53, pulse 83, temperature 98.5 F (36.9 C), temperature source Oral, resp. rate 15, height 5\' 2"  (1.575 m), weight 51.5 kg, SpO2 98 %. Physical Exam  Constitutional:  Well-developed thin Caucasian female in NAD.  HENT:  Mouth/Throat: Oropharynx is clear and moist.  Eyes: Conjunctivae are normal. No scleral icterus.  Neck: No thyromegaly present.  Cardiovascular: Normal rate, regular rhythm and normal heart sounds.   No murmur heard. Respiratory: Effort normal and breath sounds normal.  GI: Soft. She exhibits no distension and no mass. There is no tenderness.  Musculoskeletal: She exhibits no edema.  Lymphadenopathy:    She has no cervical adenopathy.  Neurological: She is alert.  Skin: Skin is warm and dry.     Assessment/Plan Esophageal dysphagia and abnormal esophagogram. EGD with ED.  Hildred Laser, MD 05/27/2018, 7:32 AM

## 2018-05-27 NOTE — Op Note (Signed)
[facility] Patient Name: Terry Allen Procedure Date: 05/27/2018 7:04 AM MRN: [id number] Date of Birth: Jan 22, 1941 Attending MD: Hildred Laser , MD CSN: [id number] Age: 77 Admit Type: Outpatient Procedure:                Upper GI endoscopy Indications:              Therapeutic procedure, Esophageal dysphagia Providers:                Hildred Laser, MD, Rosina Lowenstein, RN, Aram Candela Referring MD:             Delphina Cahill, MD Medicines:                Lidocaine spray, Meperidine 25 mg IV, Midazolam 3                            mg IV Complications:            No immediate complications. Estimated Blood Loss:     Estimated blood loss was minimal. Procedure:                Pre-Anesthesia Assessment:                           - Prior to the procedure, a History and Physical                            was performed, and patient medications and                            allergies were reviewed. The patient's tolerance of                            previous anesthesia was also reviewed. The risks                            and benefits of the procedure and the sedation                            options and risks were discussed with the patient.                            All questions were answered, and informed consent                            was obtained. Prior Anticoagulants: The patient                            last took previous NSAID medication 1 day prior to                            the procedure. ASA Grade Assessment: II - A patient                            with mild systemic disease. After reviewing the  risks and benefits, the patient was deemed in                            satisfactory condition to undergo the procedure.                           After obtaining informed consent, the endoscope was                            passed under direct vision. Throughout the                            procedure, the patient's blood pressure,  pulse, and                            oxygen saturations were monitored continuously. The                            [id number]-H190 ([id number]) scope was introduced through the                            mouth, and advanced to the second part of duodenum.                            The upper GI endoscopy was accomplished without                            difficulty. The patient tolerated the procedure                            well. Scope In: 7:41:01 AM Scope Out: 7:48:45 AM Total Procedure Duration: 0 hours 7 minutes 44 seconds  Findings:      A web was found in the proximal esophagus. The scope was withdrawn.       Dilation was performed with a Maloney dilator with mild resistance at 48       Fr. The dilation site was examined following endoscope reinsertion and       showed moderate mucosal disruption, moderate improvement in luminal       narrowing and no perforation.      The exam of the esophagus was otherwise normal.      The Z-line was irregular and was found 36 cm from the incisors.      The entire examined stomach was normal.      The duodenal bulb and second portion of the duodenum were normal. Impression:               - Web in the proximal esophagus. Dilated.                           - Z-line irregular, 36 cm from the incisors.                           - Normal stomach.                           -  Normal duodenal bulb and second portion of the                            duodenum.                           - No specimens collected. Moderate Sedation:      Moderate (conscious) sedation was administered by the endoscopy nurse       and supervised by the endoscopist. The following parameters were       monitored: oxygen saturation, heart rate, blood pressure, CO2       capnography and response to care. Total physician intraservice time was       10 minutes. Recommendation:           - Patient has a contact number available for                            emergencies. The signs  and symptoms of potential                            delayed complications were discussed with the                            patient. Return to normal activities tomorrow.                            Written discharge instructions were provided to the                            patient.                           - Mechanical soft diet for 2 days.                           - Continue present medications.                           - No aspirin, ibuprofen, naproxen, or other                            non-steroidal anti-inflammatory drugs for 1 day.                           - Hycet liquid 10 ml po qid prn.                           - Repeat upper endoscopy PRN. Procedure Code(s):        --- Professional ---                           [phone], Esophagogastroduodenoscopy, flexible,                            transoral; diagnostic, including collection of  specimen(s) by brushing or washing, when performed                            (separate procedure)                           43450, Dilation of esophagus, by unguided sound or                            bougie, single or multiple passes                           G0500, Moderate sedation services provided by the                            same physician or other qualified health care                            professional performing a gastrointestinal                            endoscopic service that sedation supports,                            requiring the presence of an independent trained                            observer to assist in the monitoring of the                            patient's level of consciousness and physiological                            status; initial 15 minutes of intra-service time;                            patient age 53 years or older (additional time may                            be reported with [phone], as appropriate) Diagnosis Code(s):        --- Professional ---                            Q39.4, Esophageal web                           K22.8, Other specified diseases of esophagus                           R13.14, Dysphagia, pharyngoesophageal phase CPT copyright 2017 American Medical Association. All rights reserved. The codes documented in this report are preliminary and upon coder review may  be revised to meet current compliance requirements. Hildred Laser, MD Hildred Laser, MD 05/27/2018 8:03:35 AM This report has been signed electronically. Number of Addenda: 0

## 2018-05-27 NOTE — Discharge Instructions (Signed)
Do not take Aleve aspirin or other NSAIDs while you are taking meloxicam. Resume meloxicam on 05/28/2018. Hycet liquid 2 teaspoonful every 6 hours as needed for pain.  (prescription given) Soft foods for 2 days. No driving for 24 hours. Pleas call office with progress report in 1 week.  Esophagogastroduodenoscopy, Care After Refer to this sheet in the next few weeks. These instructions provide you with information about caring for yourself after your procedure. Your health care provider may also give you more specific instructions. Your treatment has been planned according to current medical practices, but problems sometimes occur. Call your health care provider if you have any problems or questions after your procedure.  (Dr Laural Golden:  [phone]; After hours and weekends call the hospital and have the GI doctor on call paged; they will call you back.) What can I expect after the procedure? After the procedure, it is common to have:  A sore throat.  Nausea.  Bloating.  Dizziness.  Fatigue.  Follow these instructions at home:  Do not eat or drink anything until the numbing medicine (local anesthetic) has worn off and your gag reflex has returned. You will know that the local anesthetic has worn off when you can swallow comfortably.  Do not drive for 24 hours if you received a medicine to help you relax (sedative).  Keep all follow-up visits as told by your health care provider. This is important. Contact a health care provider if:  You cannot stop coughing.  You are not urinating.  You are urinating less than usual. Get help right away if:  You have trouble swallowing.  You cannot eat or drink.  You have throat or chest pain that gets worse.  You faint.  You have nausea or vomiting.  You have chills.  You have a fever.  You have severe abdominal pain.  You have black, tarry, or bloody stools. This information is not intended to replace advice given to you by your  health care provider. Make sure you discuss any questions you have with your health care provider. Document Released: 08/12/2012 Document Revised: 02/01/2016 Document Reviewed: 07/20/2015 Elsevier Interactive Patient Education  [employer].

## 2018-05-29 DIAGNOSIS — Z6822 Body mass index (BMI) 22.0-22.9, adult: Secondary | ICD-10-CM | POA: Diagnosis not present

## 2018-05-29 DIAGNOSIS — K5900 Constipation, unspecified: Secondary | ICD-10-CM | POA: Diagnosis not present

## 2018-05-29 DIAGNOSIS — R0789 Other chest pain: Secondary | ICD-10-CM | POA: Diagnosis not present

## 2018-05-29 DIAGNOSIS — E039 Hypothyroidism, unspecified: Secondary | ICD-10-CM | POA: Diagnosis not present

## 2018-05-29 DIAGNOSIS — E785 Hyperlipidemia, unspecified: Secondary | ICD-10-CM | POA: Diagnosis not present

## 2018-05-29 DIAGNOSIS — R3989 Other symptoms and signs involving the genitourinary system: Secondary | ICD-10-CM | POA: Diagnosis not present

## 2018-05-29 DIAGNOSIS — R1310 Dysphagia, unspecified: Secondary | ICD-10-CM | POA: Diagnosis not present

## 2018-05-29 DIAGNOSIS — N3020 Other chronic cystitis without hematuria: Secondary | ICD-10-CM | POA: Diagnosis not present

## 2018-05-29 DIAGNOSIS — M545 Low back pain: Secondary | ICD-10-CM | POA: Diagnosis not present

## 2018-05-29 DIAGNOSIS — Z6820 Body mass index (BMI) 20.0-20.9, adult: Secondary | ICD-10-CM | POA: Diagnosis not present

## 2018-05-29 DIAGNOSIS — L259 Unspecified contact dermatitis, unspecified cause: Secondary | ICD-10-CM | POA: Diagnosis not present

## 2018-05-29 DIAGNOSIS — R300 Dysuria: Secondary | ICD-10-CM | POA: Diagnosis not present

## 2018-05-29 DIAGNOSIS — N390 Urinary tract infection, site not specified: Secondary | ICD-10-CM | POA: Diagnosis not present

## 2018-05-29 DIAGNOSIS — K2270 Barrett's esophagus without dysplasia: Secondary | ICD-10-CM | POA: Diagnosis not present

## 2018-06-01 ENCOUNTER — Encounter (HOSPITAL_COMMUNITY): Payer: Self-pay | Admitting: Internal Medicine

## 2018-06-11 DIAGNOSIS — E039 Hypothyroidism, unspecified: Secondary | ICD-10-CM | POA: Diagnosis not present

## 2018-06-11 DIAGNOSIS — E785 Hyperlipidemia, unspecified: Secondary | ICD-10-CM | POA: Diagnosis not present

## 2018-06-12 DIAGNOSIS — Z6820 Body mass index (BMI) 20.0-20.9, adult: Secondary | ICD-10-CM | POA: Diagnosis not present

## 2018-06-12 DIAGNOSIS — K089 Disorder of teeth and supporting structures, unspecified: Secondary | ICD-10-CM | POA: Diagnosis not present

## 2018-06-12 DIAGNOSIS — N390 Urinary tract infection, site not specified: Secondary | ICD-10-CM | POA: Diagnosis not present

## 2018-07-20 ENCOUNTER — Encounter (INDEPENDENT_AMBULATORY_CARE_PROVIDER_SITE_OTHER): Payer: Self-pay | Admitting: *Deleted

## 2018-07-20 DIAGNOSIS — E785 Hyperlipidemia, unspecified: Secondary | ICD-10-CM | POA: Diagnosis not present

## 2018-07-20 DIAGNOSIS — K219 Gastro-esophageal reflux disease without esophagitis: Secondary | ICD-10-CM | POA: Diagnosis not present

## 2018-07-20 DIAGNOSIS — E039 Hypothyroidism, unspecified: Secondary | ICD-10-CM | POA: Diagnosis not present

## 2018-07-20 DIAGNOSIS — N390 Urinary tract infection, site not specified: Secondary | ICD-10-CM | POA: Diagnosis not present

## 2018-07-28 DIAGNOSIS — N3941 Urge incontinence: Secondary | ICD-10-CM | POA: Diagnosis not present

## 2018-07-28 DIAGNOSIS — N3020 Other chronic cystitis without hematuria: Secondary | ICD-10-CM | POA: Diagnosis not present

## 2018-07-29 DIAGNOSIS — Z6820 Body mass index (BMI) 20.0-20.9, adult: Secondary | ICD-10-CM | POA: Diagnosis not present

## 2018-07-29 DIAGNOSIS — R296 Repeated falls: Secondary | ICD-10-CM | POA: Diagnosis not present

## 2018-07-29 DIAGNOSIS — E860 Dehydration: Secondary | ICD-10-CM | POA: Diagnosis not present

## 2018-07-29 DIAGNOSIS — E440 Moderate protein-calorie malnutrition: Secondary | ICD-10-CM | POA: Diagnosis not present

## 2018-08-11 DIAGNOSIS — E785 Hyperlipidemia, unspecified: Secondary | ICD-10-CM | POA: Diagnosis not present

## 2018-08-11 DIAGNOSIS — K219 Gastro-esophageal reflux disease without esophagitis: Secondary | ICD-10-CM | POA: Diagnosis not present

## 2018-08-11 DIAGNOSIS — E039 Hypothyroidism, unspecified: Secondary | ICD-10-CM | POA: Diagnosis not present

## 2018-08-20 DIAGNOSIS — R3989 Other symptoms and signs involving the genitourinary system: Secondary | ICD-10-CM | POA: Diagnosis not present

## 2018-08-20 DIAGNOSIS — K219 Gastro-esophageal reflux disease without esophagitis: Secondary | ICD-10-CM | POA: Diagnosis not present

## 2018-08-20 DIAGNOSIS — K2270 Barrett's esophagus without dysplasia: Secondary | ICD-10-CM | POA: Diagnosis not present

## 2018-08-20 DIAGNOSIS — Z0000 Encounter for general adult medical examination without abnormal findings: Secondary | ICD-10-CM | POA: Diagnosis not present

## 2018-08-20 DIAGNOSIS — Z6820 Body mass index (BMI) 20.0-20.9, adult: Secondary | ICD-10-CM | POA: Diagnosis not present

## 2018-08-20 DIAGNOSIS — E860 Dehydration: Secondary | ICD-10-CM | POA: Diagnosis not present

## 2018-08-20 DIAGNOSIS — R296 Repeated falls: Secondary | ICD-10-CM | POA: Diagnosis not present

## 2018-08-20 DIAGNOSIS — E039 Hypothyroidism, unspecified: Secondary | ICD-10-CM | POA: Diagnosis not present

## 2018-08-20 DIAGNOSIS — N3020 Other chronic cystitis without hematuria: Secondary | ICD-10-CM | POA: Diagnosis not present

## 2018-08-20 DIAGNOSIS — K089 Disorder of teeth and supporting structures, unspecified: Secondary | ICD-10-CM | POA: Diagnosis not present

## 2018-08-20 DIAGNOSIS — E440 Moderate protein-calorie malnutrition: Secondary | ICD-10-CM | POA: Diagnosis not present

## 2018-08-20 DIAGNOSIS — R5381 Other malaise: Secondary | ICD-10-CM | POA: Diagnosis not present

## 2018-08-20 DIAGNOSIS — E785 Hyperlipidemia, unspecified: Secondary | ICD-10-CM | POA: Diagnosis not present

## 2018-09-17 DIAGNOSIS — Z0000 Encounter for general adult medical examination without abnormal findings: Secondary | ICD-10-CM | POA: Diagnosis not present

## 2018-09-17 DIAGNOSIS — E440 Moderate protein-calorie malnutrition: Secondary | ICD-10-CM | POA: Diagnosis not present

## 2018-09-17 DIAGNOSIS — E785 Hyperlipidemia, unspecified: Secondary | ICD-10-CM | POA: Diagnosis not present

## 2018-09-17 DIAGNOSIS — K2270 Barrett's esophagus without dysplasia: Secondary | ICD-10-CM | POA: Diagnosis not present

## 2018-09-17 DIAGNOSIS — N3020 Other chronic cystitis without hematuria: Secondary | ICD-10-CM | POA: Diagnosis not present

## 2018-09-17 DIAGNOSIS — K219 Gastro-esophageal reflux disease without esophagitis: Secondary | ICD-10-CM | POA: Diagnosis not present

## 2018-09-17 DIAGNOSIS — R5381 Other malaise: Secondary | ICD-10-CM | POA: Diagnosis not present

## 2018-09-17 DIAGNOSIS — K089 Disorder of teeth and supporting structures, unspecified: Secondary | ICD-10-CM | POA: Diagnosis not present

## 2018-09-17 DIAGNOSIS — E860 Dehydration: Secondary | ICD-10-CM | POA: Diagnosis not present

## 2018-09-17 DIAGNOSIS — E039 Hypothyroidism, unspecified: Secondary | ICD-10-CM | POA: Diagnosis not present

## 2018-09-17 DIAGNOSIS — Z6820 Body mass index (BMI) 20.0-20.9, adult: Secondary | ICD-10-CM | POA: Diagnosis not present

## 2018-09-17 DIAGNOSIS — R3989 Other symptoms and signs involving the genitourinary system: Secondary | ICD-10-CM | POA: Diagnosis not present

## 2018-09-23 DIAGNOSIS — H8110 Benign paroxysmal vertigo, unspecified ear: Secondary | ICD-10-CM | POA: Diagnosis not present

## 2018-09-23 DIAGNOSIS — R42 Dizziness and giddiness: Secondary | ICD-10-CM | POA: Diagnosis not present

## 2018-09-23 DIAGNOSIS — Z9181 History of falling: Secondary | ICD-10-CM | POA: Diagnosis not present

## 2018-09-24 DIAGNOSIS — N3941 Urge incontinence: Secondary | ICD-10-CM | POA: Diagnosis not present

## 2018-09-24 DIAGNOSIS — N3020 Other chronic cystitis without hematuria: Secondary | ICD-10-CM | POA: Diagnosis not present

## 2018-10-23 DIAGNOSIS — R102 Pelvic and perineal pain: Secondary | ICD-10-CM | POA: Diagnosis not present

## 2018-10-23 DIAGNOSIS — N3020 Other chronic cystitis without hematuria: Secondary | ICD-10-CM | POA: Diagnosis not present

## 2018-10-23 DIAGNOSIS — N3000 Acute cystitis without hematuria: Secondary | ICD-10-CM | POA: Diagnosis not present

## 2018-11-04 DIAGNOSIS — K219 Gastro-esophageal reflux disease without esophagitis: Secondary | ICD-10-CM | POA: Diagnosis not present

## 2018-11-04 DIAGNOSIS — E785 Hyperlipidemia, unspecified: Secondary | ICD-10-CM | POA: Diagnosis not present

## 2018-11-04 DIAGNOSIS — E039 Hypothyroidism, unspecified: Secondary | ICD-10-CM | POA: Diagnosis not present

## 2018-11-04 DIAGNOSIS — K2270 Barrett's esophagus without dysplasia: Secondary | ICD-10-CM | POA: Diagnosis not present

## 2018-11-12 DIAGNOSIS — E039 Hypothyroidism, unspecified: Secondary | ICD-10-CM | POA: Diagnosis not present

## 2018-11-12 DIAGNOSIS — E785 Hyperlipidemia, unspecified: Secondary | ICD-10-CM | POA: Diagnosis not present

## 2018-11-12 DIAGNOSIS — R3989 Other symptoms and signs involving the genitourinary system: Secondary | ICD-10-CM | POA: Diagnosis not present

## 2018-11-12 DIAGNOSIS — K219 Gastro-esophageal reflux disease without esophagitis: Secondary | ICD-10-CM | POA: Diagnosis not present

## 2018-11-12 DIAGNOSIS — N3020 Other chronic cystitis without hematuria: Secondary | ICD-10-CM | POA: Diagnosis not present

## 2018-11-12 DIAGNOSIS — K089 Disorder of teeth and supporting structures, unspecified: Secondary | ICD-10-CM | POA: Diagnosis not present

## 2018-11-12 DIAGNOSIS — E860 Dehydration: Secondary | ICD-10-CM | POA: Diagnosis not present

## 2018-11-12 DIAGNOSIS — Z0000 Encounter for general adult medical examination without abnormal findings: Secondary | ICD-10-CM | POA: Diagnosis not present

## 2018-11-12 DIAGNOSIS — N3000 Acute cystitis without hematuria: Secondary | ICD-10-CM | POA: Diagnosis not present

## 2018-11-12 DIAGNOSIS — R296 Repeated falls: Secondary | ICD-10-CM | POA: Diagnosis not present

## 2018-11-12 DIAGNOSIS — R5381 Other malaise: Secondary | ICD-10-CM | POA: Diagnosis not present

## 2018-11-12 DIAGNOSIS — E440 Moderate protein-calorie malnutrition: Secondary | ICD-10-CM | POA: Diagnosis not present

## 2018-11-12 DIAGNOSIS — K2270 Barrett's esophagus without dysplasia: Secondary | ICD-10-CM | POA: Diagnosis not present

## 2018-11-12 DIAGNOSIS — R110 Nausea: Secondary | ICD-10-CM | POA: Diagnosis not present

## 2018-11-17 DIAGNOSIS — E785 Hyperlipidemia, unspecified: Secondary | ICD-10-CM | POA: Diagnosis not present

## 2018-11-17 DIAGNOSIS — K219 Gastro-esophageal reflux disease without esophagitis: Secondary | ICD-10-CM | POA: Diagnosis not present

## 2018-11-17 DIAGNOSIS — K2270 Barrett's esophagus without dysplasia: Secondary | ICD-10-CM | POA: Diagnosis not present

## 2018-11-17 DIAGNOSIS — E039 Hypothyroidism, unspecified: Secondary | ICD-10-CM | POA: Diagnosis not present

## 2018-11-19 DIAGNOSIS — K219 Gastro-esophageal reflux disease without esophagitis: Secondary | ICD-10-CM | POA: Diagnosis not present

## 2018-11-19 DIAGNOSIS — K2270 Barrett's esophagus without dysplasia: Secondary | ICD-10-CM | POA: Diagnosis not present

## 2018-11-19 DIAGNOSIS — E039 Hypothyroidism, unspecified: Secondary | ICD-10-CM | POA: Diagnosis not present

## 2018-11-19 DIAGNOSIS — E782 Mixed hyperlipidemia: Secondary | ICD-10-CM | POA: Diagnosis not present

## 2018-11-20 DIAGNOSIS — N3000 Acute cystitis without hematuria: Secondary | ICD-10-CM | POA: Diagnosis not present

## 2018-11-20 DIAGNOSIS — R300 Dysuria: Secondary | ICD-10-CM | POA: Diagnosis not present

## 2018-11-20 DIAGNOSIS — N3020 Other chronic cystitis without hematuria: Secondary | ICD-10-CM | POA: Diagnosis not present

## 2018-11-25 DIAGNOSIS — K2270 Barrett's esophagus without dysplasia: Secondary | ICD-10-CM | POA: Diagnosis not present

## 2018-11-25 DIAGNOSIS — K210 Gastro-esophageal reflux disease with esophagitis: Secondary | ICD-10-CM | POA: Diagnosis not present

## 2018-11-25 DIAGNOSIS — R110 Nausea: Secondary | ICD-10-CM | POA: Diagnosis not present

## 2018-11-26 ENCOUNTER — Encounter (INDEPENDENT_AMBULATORY_CARE_PROVIDER_SITE_OTHER): Payer: Self-pay | Admitting: Internal Medicine

## 2018-12-08 ENCOUNTER — Ambulatory Visit (INDEPENDENT_AMBULATORY_CARE_PROVIDER_SITE_OTHER): Payer: PPO | Admitting: Internal Medicine

## 2018-12-08 ENCOUNTER — Encounter (INDEPENDENT_AMBULATORY_CARE_PROVIDER_SITE_OTHER): Payer: Self-pay | Admitting: Internal Medicine

## 2018-12-08 ENCOUNTER — Other Ambulatory Visit: Payer: Self-pay

## 2018-12-08 DIAGNOSIS — R1310 Dysphagia, unspecified: Secondary | ICD-10-CM

## 2018-12-08 DIAGNOSIS — R1319 Other dysphagia: Secondary | ICD-10-CM

## 2018-12-08 NOTE — Patient Instructions (Signed)
DG esophagram. Further recommendations to follow.

## 2018-12-08 NOTE — Progress Notes (Addendum)
Subjective:    Patient ID: Terry Allen, female    DOB: 28-Nov-1940, 78 y.o.   MRN: [id number]  Patient request to speak to me. I am in the office. Started 1245 pm. I spent a total of 20 minutes with this nice patient.  Telephone OV due to the COVID-19. Marland Kitchen Unable to to Video OV.  HPI Patient is at home. Agrees to this visit. Having chest pain from her chest to belly button. Hurts to take a deep breath. She says her chest is sore.  She has a cough 3-4 months.  Foods do not make her sick. Sometimes now she has dysphagia.  If she keep swallowing, foods will go down. Pills feel like they are lodging.   Her appetite is good. No weight loss per patient. No fever.  She had an EGD/ED in September for dysphagia (see below). No reflux was noted.  Hx of Short segment barrett GERD controlled with Protonix.  Her last EGD was in September of 2019 for dysphagia.  Impression:               - Web in the proximal esophagus. Dilated.                           - Z-line irregular, 36 cm from the incisors.                           - Normal stomach.                           - Normal duodenal bulb and second portion of the                            duodenum.                           - No specimens collected.   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 GE reflux:  Not witnessed during exam  06/16/2015 EGD: chronic GERD and hx of short segment Barrett's, Three month hx of epigastric pain associated with post-prandial nausea and 8-10 pound wt loss.  Abdominopelvic CT was unremarkable other than thickening to gastric wall possibly from poor distention. She did not get relief from Dicyclomine. US reveals sludge but no stones in the GB.   Impression: No evidence of peptic ulcer disease or gastritis. Wavy GE junction with tiny islands  of salmon colored mucosa secondary to short segment Barrett's esophagus documented previously.      Review of Systems Past Medical History:  Diagnosis Date  . Acid reflux    Patient reports Barretts  . Bulging lumbar disc   . High cholesterol   . Hypothyroidism   . Nerve damage   . Osteopenia   . Pelvic pain in female    due to bladder sling    Past Surgical History:  Procedure Laterality Date  . APPENDECTOMY  1960  . BLADDER SUSPENSION    . ESOPHAGEAL DILATION N/A 05/27/2018   Procedure: ESOPHAGEAL DILATION;  Surgeon: Rogene Houston, MD;  Location: [facility];  Service: Endoscopy;  Laterality: N/A;  . ESOPHAGOGASTRODUODENOSCOPY N/A 12/21/2014   Procedure: ESOPHAGOGASTRODUODENOSCOPY (EGD);  Surgeon: Rogene Houston, MD;  Location: [facility];  Service: Endoscopy;  Laterality: N/A;  210  . ESOPHAGOGASTRODUODENOSCOPY N/A 06/16/2015   Procedure: ESOPHAGOGASTRODUODENOSCOPY (EGD);  Surgeon: Rogene Houston, MD;  Location: [facility];  Service: Endoscopy;  Laterality: N/A;  1250  . ESOPHAGOGASTRODUODENOSCOPY N/A 05/27/2018   Procedure: ESOPHAGOGASTRODUODENOSCOPY (EGD);  Surgeon: Rogene Houston, MD;  Location: [facility];  Service: Endoscopy;  Laterality: N/A;  7:30    No Known Allergies  Current Outpatient Medications on File Prior to Visit  Medication Sig Dispense Refill  . acetaminophen (TYLENOL) 500 MG tablet Take 1,000 mg by mouth every 6 (six) hours as needed (for pain.).     Marland Kitchen amitriptyline (ELAVIL) 25 MG tablet Take 25 mg by mouth at bedtime.  2  . atorvastatin (LIPITOR) 40 MG tablet Take 40 mg by mouth daily.    Marland Kitchen gabapentin (NEURONTIN) 300 MG capsule Take 2 capsules (600 mg total) by mouth 3 (three) times daily. (Patient taking differently: Take 300 mg by mouth 3 (three) times daily. Morning, evening, & night.) 540 capsule 4  . levothyroxine (SYNTHROID, LEVOTHROID) 50 MCG tablet Take 62.5 mcg by mouth daily before breakfast.   4  . meloxicam (MOBIC) 15 MG  tablet Take 1 tablet (15 mg total) by mouth daily.  5  . Multiple Vitamin (MULTIVITAMIN WITH MINERALS) TABS tablet Take 1 tablet by mouth daily. One-A-Day    . nitrofurantoin (MACRODANTIN) 100 MG capsule Take 100 mg by mouth daily.    . ondansetron (ZOFRAN) 4 MG tablet Take 4 mg by mouth every 8 (eight) hours as needed for nausea or vomiting.    . pantoprazole (PROTONIX) 40 MG tablet TAKE ONE TABLET BY MOUTH ONCE DAILY (Patient taking differently: Take 40 mg by mouth at bedtime. ) 30 tablet 3  . tolterodine (DETROL LA) 4 MG 24 hr capsule Take 4 mg by mouth daily.     No current facility-administered medications on file prior to visit.         Objective:   Physical Exam  deferred.         Assessment & Plan:  Dysphagia. Am going to get an Esophagram.  Continue the Protonix. Further recommendations to follow.

## 2018-12-14 ENCOUNTER — Ambulatory Visit (HOSPITAL_COMMUNITY): Payer: PPO

## 2018-12-15 ENCOUNTER — Telehealth (INDEPENDENT_AMBULATORY_CARE_PROVIDER_SITE_OTHER): Payer: Self-pay | Admitting: Internal Medicine

## 2018-12-15 NOTE — Telephone Encounter (Signed)
Patient came by office stated she thought she was to have a procedure this morning - told her it was yesterday and it looks like it was cancelled - please call her

## 2018-12-15 NOTE — Telephone Encounter (Signed)
Ann is aware

## 2018-12-21 DIAGNOSIS — K219 Gastro-esophageal reflux disease without esophagitis: Secondary | ICD-10-CM | POA: Diagnosis not present

## 2018-12-21 DIAGNOSIS — K2270 Barrett's esophagus without dysplasia: Secondary | ICD-10-CM | POA: Diagnosis not present

## 2018-12-21 DIAGNOSIS — E785 Hyperlipidemia, unspecified: Secondary | ICD-10-CM | POA: Diagnosis not present

## 2018-12-21 DIAGNOSIS — E039 Hypothyroidism, unspecified: Secondary | ICD-10-CM | POA: Diagnosis not present

## 2018-12-22 ENCOUNTER — Encounter (INDEPENDENT_AMBULATORY_CARE_PROVIDER_SITE_OTHER): Payer: Self-pay | Admitting: *Deleted

## 2018-12-22 ENCOUNTER — Telehealth (INDEPENDENT_AMBULATORY_CARE_PROVIDER_SITE_OTHER): Payer: Self-pay | Admitting: Internal Medicine

## 2018-12-22 DIAGNOSIS — Z23 Encounter for immunization: Secondary | ICD-10-CM | POA: Diagnosis not present

## 2018-12-22 DIAGNOSIS — E1140 Type 2 diabetes mellitus with diabetic neuropathy, unspecified: Secondary | ICD-10-CM | POA: Diagnosis not present

## 2018-12-22 DIAGNOSIS — I5022 Chronic systolic (congestive) heart failure: Secondary | ICD-10-CM | POA: Diagnosis not present

## 2018-12-22 DIAGNOSIS — E039 Hypothyroidism, unspecified: Secondary | ICD-10-CM | POA: Diagnosis not present

## 2018-12-22 DIAGNOSIS — N184 Chronic kidney disease, stage 4 (severe): Secondary | ICD-10-CM | POA: Diagnosis not present

## 2018-12-22 DIAGNOSIS — R1310 Dysphagia, unspecified: Secondary | ICD-10-CM

## 2018-12-22 DIAGNOSIS — D689 Coagulation defect, unspecified: Secondary | ICD-10-CM | POA: Diagnosis not present

## 2018-12-22 DIAGNOSIS — N186 End stage renal disease: Secondary | ICD-10-CM | POA: Diagnosis not present

## 2018-12-22 DIAGNOSIS — S72001D Fracture of unspecified part of neck of right femur, subsequent encounter for closed fracture with routine healing: Secondary | ICD-10-CM | POA: Diagnosis not present

## 2018-12-22 DIAGNOSIS — D631 Anemia in chronic kidney disease: Secondary | ICD-10-CM | POA: Diagnosis not present

## 2018-12-22 DIAGNOSIS — Z992 Dependence on renal dialysis: Secondary | ICD-10-CM | POA: Diagnosis not present

## 2018-12-22 DIAGNOSIS — N2581 Secondary hyperparathyroidism of renal origin: Secondary | ICD-10-CM | POA: Diagnosis not present

## 2018-12-22 DIAGNOSIS — E785 Hyperlipidemia, unspecified: Secondary | ICD-10-CM | POA: Diagnosis not present

## 2018-12-22 DIAGNOSIS — R2689 Other abnormalities of gait and mobility: Secondary | ICD-10-CM | POA: Diagnosis not present

## 2018-12-22 DIAGNOSIS — E1122 Type 2 diabetes mellitus with diabetic chronic kidney disease: Secondary | ICD-10-CM | POA: Diagnosis not present

## 2018-12-22 DIAGNOSIS — I119 Hypertensive heart disease without heart failure: Secondary | ICD-10-CM | POA: Diagnosis not present

## 2018-12-22 DIAGNOSIS — D539 Nutritional anemia, unspecified: Secondary | ICD-10-CM | POA: Diagnosis not present

## 2018-12-22 DIAGNOSIS — R41841 Cognitive communication deficit: Secondary | ICD-10-CM | POA: Diagnosis not present

## 2018-12-22 DIAGNOSIS — R1319 Other dysphagia: Secondary | ICD-10-CM

## 2018-12-22 DIAGNOSIS — E440 Moderate protein-calorie malnutrition: Secondary | ICD-10-CM | POA: Diagnosis not present

## 2018-12-22 DIAGNOSIS — N401 Enlarged prostate with lower urinary tract symptoms: Secondary | ICD-10-CM | POA: Diagnosis not present

## 2018-12-22 DIAGNOSIS — I255 Ischemic cardiomyopathy: Secondary | ICD-10-CM | POA: Diagnosis not present

## 2018-12-22 DIAGNOSIS — R278 Other lack of coordination: Secondary | ICD-10-CM | POA: Diagnosis not present

## 2018-12-22 NOTE — Telephone Encounter (Signed)
Lelon Frohlich, DG esophagram

## 2018-12-22 NOTE — Telephone Encounter (Signed)
Esophagram sch'd 02/10/19 at 10 (945), npo 3 hrs, letter mailed to patient

## 2018-12-31 DIAGNOSIS — R110 Nausea: Secondary | ICD-10-CM | POA: Diagnosis not present

## 2018-12-31 DIAGNOSIS — E039 Hypothyroidism, unspecified: Secondary | ICD-10-CM | POA: Diagnosis not present

## 2019-01-04 DIAGNOSIS — Z0000 Encounter for general adult medical examination without abnormal findings: Secondary | ICD-10-CM | POA: Diagnosis not present

## 2019-01-11 DIAGNOSIS — E785 Hyperlipidemia, unspecified: Secondary | ICD-10-CM | POA: Diagnosis not present

## 2019-01-11 DIAGNOSIS — E039 Hypothyroidism, unspecified: Secondary | ICD-10-CM | POA: Diagnosis not present

## 2019-01-11 DIAGNOSIS — K219 Gastro-esophageal reflux disease without esophagitis: Secondary | ICD-10-CM | POA: Diagnosis not present

## 2019-01-25 DIAGNOSIS — Z6820 Body mass index (BMI) 20.0-20.9, adult: Secondary | ICD-10-CM | POA: Diagnosis not present

## 2019-01-25 DIAGNOSIS — R3989 Other symptoms and signs involving the genitourinary system: Secondary | ICD-10-CM | POA: Diagnosis not present

## 2019-01-25 DIAGNOSIS — N3000 Acute cystitis without hematuria: Secondary | ICD-10-CM | POA: Diagnosis not present

## 2019-01-25 DIAGNOSIS — K2270 Barrett's esophagus without dysplasia: Secondary | ICD-10-CM | POA: Diagnosis not present

## 2019-01-25 DIAGNOSIS — E440 Moderate protein-calorie malnutrition: Secondary | ICD-10-CM | POA: Diagnosis not present

## 2019-01-25 DIAGNOSIS — E860 Dehydration: Secondary | ICD-10-CM | POA: Diagnosis not present

## 2019-01-25 DIAGNOSIS — E785 Hyperlipidemia, unspecified: Secondary | ICD-10-CM | POA: Diagnosis not present

## 2019-01-25 DIAGNOSIS — W57XXXD Bitten or stung by nonvenomous insect and other nonvenomous arthropods, subsequent encounter: Secondary | ICD-10-CM | POA: Diagnosis not present

## 2019-01-25 DIAGNOSIS — R110 Nausea: Secondary | ICD-10-CM | POA: Diagnosis not present

## 2019-01-25 DIAGNOSIS — R630 Anorexia: Secondary | ICD-10-CM | POA: Diagnosis not present

## 2019-01-25 DIAGNOSIS — K089 Disorder of teeth and supporting structures, unspecified: Secondary | ICD-10-CM | POA: Diagnosis not present

## 2019-01-25 DIAGNOSIS — R5381 Other malaise: Secondary | ICD-10-CM | POA: Diagnosis not present

## 2019-01-25 DIAGNOSIS — E039 Hypothyroidism, unspecified: Secondary | ICD-10-CM | POA: Diagnosis not present

## 2019-01-25 DIAGNOSIS — N3020 Other chronic cystitis without hematuria: Secondary | ICD-10-CM | POA: Diagnosis not present

## 2019-01-27 DIAGNOSIS — R1013 Epigastric pain: Secondary | ICD-10-CM | POA: Diagnosis not present

## 2019-01-27 DIAGNOSIS — R1311 Dysphagia, oral phase: Secondary | ICD-10-CM | POA: Diagnosis not present

## 2019-02-10 ENCOUNTER — Other Ambulatory Visit (INDEPENDENT_AMBULATORY_CARE_PROVIDER_SITE_OTHER): Payer: Self-pay | Admitting: Internal Medicine

## 2019-02-10 ENCOUNTER — Telehealth (INDEPENDENT_AMBULATORY_CARE_PROVIDER_SITE_OTHER): Payer: Self-pay | Admitting: Internal Medicine

## 2019-02-10 ENCOUNTER — Encounter (INDEPENDENT_AMBULATORY_CARE_PROVIDER_SITE_OTHER): Payer: Self-pay | Admitting: Internal Medicine

## 2019-02-10 ENCOUNTER — Other Ambulatory Visit: Payer: Self-pay

## 2019-02-10 ENCOUNTER — Ambulatory Visit (HOSPITAL_COMMUNITY)
Admission: RE | Admit: 2019-02-10 | Discharge: 2019-02-10 | Disposition: A | Discharge status: Home / Self Care | Payer: PPO | Source: Ambulatory Visit | Attending: Internal Medicine | Admitting: Internal Medicine

## 2019-02-10 DIAGNOSIS — R1310 Dysphagia, unspecified: Secondary | ICD-10-CM | POA: Diagnosis not present

## 2019-02-10 DIAGNOSIS — R1319 Other dysphagia: Secondary | ICD-10-CM

## 2019-02-10 NOTE — Telephone Encounter (Signed)
No answer at home and unable to leave message.

## 2019-02-11 ENCOUNTER — Other Ambulatory Visit (INDEPENDENT_AMBULATORY_CARE_PROVIDER_SITE_OTHER): Payer: Self-pay | Admitting: Internal Medicine

## 2019-02-11 ENCOUNTER — Other Ambulatory Visit (HOSPITAL_COMMUNITY)
Admission: RE | Admit: 2019-02-11 | Discharge: 2019-02-11 | Disposition: A | Discharge status: Home / Self Care | Payer: PPO | Source: Ambulatory Visit | Attending: Internal Medicine | Admitting: Internal Medicine

## 2019-02-11 ENCOUNTER — Telehealth (INDEPENDENT_AMBULATORY_CARE_PROVIDER_SITE_OTHER): Payer: Self-pay | Admitting: Internal Medicine

## 2019-02-11 DIAGNOSIS — Z1159 Encounter for screening for other viral diseases: Secondary | ICD-10-CM | POA: Diagnosis not present

## 2019-02-11 DIAGNOSIS — Z79899 Other long term (current) drug therapy: Secondary | ICD-10-CM | POA: Diagnosis not present

## 2019-02-11 DIAGNOSIS — Z833 Family history of diabetes mellitus: Secondary | ICD-10-CM | POA: Diagnosis not present

## 2019-02-11 DIAGNOSIS — M8580 Other specified disorders of bone density and structure, unspecified site: Secondary | ICD-10-CM | POA: Diagnosis not present

## 2019-02-11 DIAGNOSIS — Z8051 Family history of malignant neoplasm of kidney: Secondary | ICD-10-CM | POA: Diagnosis not present

## 2019-02-11 DIAGNOSIS — E039 Hypothyroidism, unspecified: Secondary | ICD-10-CM | POA: Diagnosis not present

## 2019-02-11 DIAGNOSIS — R1319 Other dysphagia: Secondary | ICD-10-CM

## 2019-02-11 DIAGNOSIS — K589 Irritable bowel syndrome without diarrhea: Secondary | ICD-10-CM | POA: Diagnosis not present

## 2019-02-11 DIAGNOSIS — Z823 Family history of stroke: Secondary | ICD-10-CM | POA: Diagnosis not present

## 2019-02-11 DIAGNOSIS — Z803 Family history of malignant neoplasm of breast: Secondary | ICD-10-CM | POA: Diagnosis not present

## 2019-02-11 DIAGNOSIS — Z791 Long term (current) use of non-steroidal anti-inflammatories (NSAID): Secondary | ICD-10-CM | POA: Diagnosis not present

## 2019-02-11 DIAGNOSIS — M5126 Other intervertebral disc displacement, lumbar region: Secondary | ICD-10-CM | POA: Diagnosis not present

## 2019-02-11 DIAGNOSIS — E7800 Pure hypercholesterolemia, unspecified: Secondary | ICD-10-CM | POA: Diagnosis not present

## 2019-02-11 DIAGNOSIS — Z801 Family history of malignant neoplasm of trachea, bronchus and lung: Secondary | ICD-10-CM | POA: Diagnosis not present

## 2019-02-11 DIAGNOSIS — R1314 Dysphagia, pharyngoesophageal phase: Secondary | ICD-10-CM | POA: Diagnosis not present

## 2019-02-11 DIAGNOSIS — R1310 Dysphagia, unspecified: Secondary | ICD-10-CM

## 2019-02-11 DIAGNOSIS — K219 Gastro-esophageal reflux disease without esophagitis: Secondary | ICD-10-CM | POA: Diagnosis not present

## 2019-02-11 DIAGNOSIS — Z8249 Family history of ischemic heart disease and other diseases of the circulatory system: Secondary | ICD-10-CM | POA: Diagnosis not present

## 2019-02-11 DIAGNOSIS — Q394 Esophageal web: Secondary | ICD-10-CM | POA: Diagnosis not present

## 2019-02-11 LAB — SARS CORONAVIRUS 2 BY RT PCR (HOSPITAL ORDER, PERFORMED IN ~~LOC~~ HOSPITAL LAB): SARS Coronavirus 2: NEGATIVE

## 2019-02-11 NOTE — Telephone Encounter (Signed)
Terry Allen, EGD/ED/

## 2019-02-11 NOTE — Telephone Encounter (Signed)
EGD/ED sch'd 02/12/19 at 130 (1230), patient aware, verbal instructions given

## 2019-02-12 ENCOUNTER — Encounter (HOSPITAL_COMMUNITY): Payer: Self-pay | Admitting: *Deleted

## 2019-02-12 ENCOUNTER — Encounter (HOSPITAL_COMMUNITY)
Admission: RE | Disposition: A | Discharge status: Home / Self Care | Payer: Self-pay | Source: Home / Self Care | Attending: Internal Medicine

## 2019-02-12 ENCOUNTER — Other Ambulatory Visit: Payer: Self-pay

## 2019-02-12 ENCOUNTER — Ambulatory Visit (HOSPITAL_COMMUNITY)
Admission: RE | Admit: 2019-02-12 | Discharge: 2019-02-12 | Disposition: A | Discharge status: Home / Self Care | Payer: PPO | Attending: Internal Medicine | Admitting: Internal Medicine

## 2019-02-12 DIAGNOSIS — K589 Irritable bowel syndrome without diarrhea: Secondary | ICD-10-CM | POA: Insufficient documentation

## 2019-02-12 DIAGNOSIS — R1310 Dysphagia, unspecified: Secondary | ICD-10-CM

## 2019-02-12 DIAGNOSIS — Q394 Esophageal web: Secondary | ICD-10-CM | POA: Insufficient documentation

## 2019-02-12 DIAGNOSIS — Z79899 Other long term (current) drug therapy: Secondary | ICD-10-CM | POA: Insufficient documentation

## 2019-02-12 DIAGNOSIS — Z823 Family history of stroke: Secondary | ICD-10-CM | POA: Insufficient documentation

## 2019-02-12 DIAGNOSIS — M8580 Other specified disorders of bone density and structure, unspecified site: Secondary | ICD-10-CM | POA: Insufficient documentation

## 2019-02-12 DIAGNOSIS — R1319 Other dysphagia: Secondary | ICD-10-CM

## 2019-02-12 DIAGNOSIS — Z801 Family history of malignant neoplasm of trachea, bronchus and lung: Secondary | ICD-10-CM | POA: Insufficient documentation

## 2019-02-12 DIAGNOSIS — Z8249 Family history of ischemic heart disease and other diseases of the circulatory system: Secondary | ICD-10-CM | POA: Insufficient documentation

## 2019-02-12 DIAGNOSIS — Z803 Family history of malignant neoplasm of breast: Secondary | ICD-10-CM | POA: Insufficient documentation

## 2019-02-12 DIAGNOSIS — K219 Gastro-esophageal reflux disease without esophagitis: Secondary | ICD-10-CM | POA: Insufficient documentation

## 2019-02-12 DIAGNOSIS — E7800 Pure hypercholesterolemia, unspecified: Secondary | ICD-10-CM | POA: Insufficient documentation

## 2019-02-12 DIAGNOSIS — Z1159 Encounter for screening for other viral diseases: Secondary | ICD-10-CM | POA: Insufficient documentation

## 2019-02-12 DIAGNOSIS — R1314 Dysphagia, pharyngoesophageal phase: Secondary | ICD-10-CM | POA: Insufficient documentation

## 2019-02-12 DIAGNOSIS — E039 Hypothyroidism, unspecified: Secondary | ICD-10-CM | POA: Insufficient documentation

## 2019-02-12 DIAGNOSIS — Z833 Family history of diabetes mellitus: Secondary | ICD-10-CM | POA: Insufficient documentation

## 2019-02-12 DIAGNOSIS — Z8051 Family history of malignant neoplasm of kidney: Secondary | ICD-10-CM | POA: Insufficient documentation

## 2019-02-12 DIAGNOSIS — Z791 Long term (current) use of non-steroidal anti-inflammatories (NSAID): Secondary | ICD-10-CM | POA: Insufficient documentation

## 2019-02-12 DIAGNOSIS — M5126 Other intervertebral disc displacement, lumbar region: Secondary | ICD-10-CM | POA: Insufficient documentation

## 2019-02-12 HISTORY — PX: ESOPHAGOGASTRODUODENOSCOPY: SHX5428

## 2019-02-12 HISTORY — PX: ESOPHAGEAL DILATION: SHX303

## 2019-02-12 SURGERY — EGD (ESOPHAGOGASTRODUODENOSCOPY)
Anesthesia: Moderate Sedation

## 2019-02-12 MED ORDER — SODIUM CHLORIDE 0.9 % IV SOLN
INTRAVENOUS | Status: DC
  Administered 2019-02-12: 13:00:00 via INTRAVENOUS

## 2019-02-12 MED ORDER — MEPERIDINE HCL 50 MG/ML IJ SOLN
INTRAMUSCULAR | Status: DC | PRN
  Administered 2019-02-12: 10 mg via INTRAVENOUS
  Administered 2019-02-12: 15 mg via INTRAVENOUS
  Administered 2019-02-12: 25 mg via INTRAVENOUS

## 2019-02-12 MED ORDER — LIDOCAINE VISCOUS HCL 2 % MT SOLN
OROMUCOSAL | Status: AC
  Filled 2019-02-12: qty 15

## 2019-02-12 MED ORDER — HYDROCODONE-ACETAMINOPHEN 7.5-325 MG/15ML PO SOLN: 10.0000 mL | Freq: Four times a day (QID) | ORAL | 0 refills | Status: DC | PRN

## 2019-02-12 MED ORDER — LIDOCAINE VISCOUS HCL 2 % MT SOLN
OROMUCOSAL | Status: DC | PRN
  Administered 2019-02-12: 6 mL via OROMUCOSAL

## 2019-02-12 MED ORDER — MIDAZOLAM HCL 5 MG/5ML IJ SOLN
INTRAMUSCULAR | Status: AC
  Filled 2019-02-12: qty 10

## 2019-02-12 MED ORDER — MIDAZOLAM HCL 5 MG/5ML IJ SOLN
INTRAMUSCULAR | Status: DC | PRN
  Administered 2019-02-12: 2 mg via INTRAVENOUS
  Administered 2019-02-12: 1 mg via INTRAVENOUS

## 2019-02-12 MED ORDER — MEPERIDINE HCL 50 MG/ML IJ SOLN
INTRAMUSCULAR | Status: AC
  Filled 2019-02-12: qty 1

## 2019-02-12 NOTE — Op Note (Signed)
[facility] Patient Name: Terry Allen Procedure Date: 02/12/2019 12:44 PM MRN: [id number] Date of Birth: [birth year] Attending MD: Hildred Laser , MD CSN: [id number] Age: 78 Admit Type: Outpatient Procedure:                Upper GI endoscopy Indications:              Esophageal dysphagia Providers:                Hildred Laser, MD, Hinton Rao, RN, Randa Spike, Technician Referring MD:             Delphina Cahill, MD Medicines:                Lidocaine spray, Meperidine 50 mg IV, Midazolam 3                            mg IV Complications:            No immediate complications. Estimated Blood Loss:     Estimated blood loss was minimal. Procedure:                Pre-Anesthesia Assessment:                           - Prior to the procedure, a History and Physical                            was performed, and patient medications and                            allergies were reviewed. The patient's tolerance of                            previous anesthesia was also reviewed. The risks                            and benefits of the procedure and the sedation                            options and risks were discussed with the patient.                            All questions were answered, and informed consent                            was obtained. Prior Anticoagulants: The patient has                            taken no previous anticoagulant or antiplatelet                            agents except for NSAID medication. ASA Grade  Assessment: III - A patient with severe systemic                            disease. After reviewing the risks and benefits,                            the patient was deemed in satisfactory condition to                            undergo the procedure.                           After obtaining informed consent, the endoscope was                            passed under direct vision. Throughout  the                            procedure, the patient's blood pressure, pulse, and                            oxygen saturations were monitored continuously. The                            [id number]-[id number] ([id number]) scope was introduced through the                            mouth, and advanced to the second part of duodenum.                            The upper GI endoscopy was accomplished without                            difficulty. The patient tolerated the procedure                            well. Scope In: 1:23:02 PM Scope Out: 1:31:18 PM Total Procedure Duration: 0 hours 8 minutes 16 seconds  Findings:      A web was found in the upper third of the esophagus. The scope was       withdrawn. Dilation was performed with a Maloney dilator with moderate       resistance at 48 Fr. The dilation site was examined following endoscope       reinsertion and showed moderate mucosal disruption, moderate improvement       in luminal narrowing and no perforation. Estimated blood loss was       minimal.      The exam of the esophagus was otherwise normal.      The Z-line was regular and was found 38 cm from the incisors.      The entire examined stomach was normal.      The duodenal bulb and second portion of the duodenum were normal. Impression:               - Web in the upper third of the esophagus. Dilated.                           -  Z-line regular, 38 cm from the incisors.                           - Normal stomach.                           - Normal duodenal bulb and second portion of the                            duodenum.                           - No specimens collected. Moderate Sedation:      Moderate (conscious) sedation was administered by the endoscopy nurse       and supervised by the endoscopist. The following parameters were       monitored: oxygen saturation, heart rate, blood pressure, CO2       capnography and response to care. Total physician intraservice time was       13  minutes. Recommendation:           - Patient has a contact number available for                            emergencies. The signs and symptoms of potential                            delayed complications were discussed with the                            patient. Return to normal activities tomorrow.                            Written discharge instructions were provided to the                            patient.                           - Mechanical soft diet today.                           - Resume previous diet for 1 day.                           - Continue present medications.                           - No aspirin, ibuprofen, naproxen, or other                            non-steroidal anti-inflammatory drugs for 3 days                           - Barium Study in 2 weeks.                           - Hycet  liquis 10 ml po q6 hour prn. Procedure Code(s):        --- Professional ---                           [phone], Esophagogastroduodenoscopy, flexible,                            transoral; diagnostic, including collection of                            specimen(s) by brushing or washing, when performed                            (separate procedure)                           43450, Dilation of esophagus, by unguided sound or                            bougie, single or multiple passes                           G0500, Moderate sedation services provided by the                            same physician or other qualified health care                            professional performing a gastrointestinal                            endoscopic service that sedation supports,                            requiring the presence of an independent trained                            observer to assist in the monitoring of the                            patient's level of consciousness and physiological                            status; initial 15 minutes of intra-service time;                             patient age 36 years or older (additional time may                            be reported with [phone], as appropriate) Diagnosis Code(s):        --- Professional ---                           Q39.4, Esophageal web  R13.14, Dysphagia, pharyngoesophageal phase CPT copyright 2019 American Medical Association. All rights reserved. The codes documented in this report are preliminary and upon coder review may  be revised to meet current compliance requirements. Hildred Laser, MD Hildred Laser, MD 02/12/2019 1:49:30 PM This report has been signed electronically. Number of Addenda: 0

## 2019-02-12 NOTE — Discharge Instructions (Signed)
No aspirin or NSAIDs for 3 days. Resume other medications as before. Hycet liquid 10 mL or 2 teaspoonful by mouth up to 4 times a day for esophageal pain. Please do not take hydrocodone Blietz when you take Hycet liquid. Mechanical soft food for 24 hours and thereafter usual diet. No driving for 24 hours. We will schedule follow-up barium test in 2 weeks.  Office will call.      Upper Endoscopy, Adult, Care After This sheet gives you information about how to care for yourself after your procedure. Your health care provider may also give you more specific instructions. If you have problems or questions, contact your health care provider. What can I expect after the procedure? After the procedure, it is common to have:  A sore throat.  Mild stomach pain or discomfort.  Bloating.  Nausea. Follow these instructions at home:   Follow instructions from your health care provider about what to eat or drink after your procedure.  Return to your normal activities as told by your health care provider. Ask your health care provider what activities are safe for you.  Take over-the-counter and prescription medicines only as told by your health care provider.  Do not drive for 24 hours if you were given a sedative during your procedure.  Keep all follow-up visits as told by your health care provider. This is important. Contact a health care provider if you have:  A sore throat that lasts longer than one day.  Trouble swallowing. Get help right away if:  You vomit blood or your vomit looks like coffee grounds.  You have: ? A fever. ? Bloody, black, or tarry stools. ? A severe sore throat or you cannot swallow. ? Difficulty breathing. ? Severe pain in your chest or abdomen. Summary  After the procedure, it is common to have a sore throat, mild stomach discomfort, bloating, and nausea.  Do not drive for 24 hours if you were given a sedative during the procedure.  Follow  instructions from your health care provider about what to eat or drink after your procedure.  Return to your normal activities as told by your health care provider. This information is not intended to replace advice given to you by your health care provider. Make sure you discuss any questions you have with your health care provider. Document Released: 02/25/2012 Document Revised: 01/26/2018 Document Reviewed: 01/26/2018 Elsevier Interactive Patient Education  2019 [place] A soft-food eating plan includes foods that are safe and easy to chew and swallow. Your health care provider or dietitian can help you find foods and flavors that fit into this plan. Follow this plan until your health care provider or dietitian says it is safe to start eating other foods and food textures. What are tips for following this plan? General guidelines   Take small bites of food, or cut food into pieces about  inch or smaller. Bite-sized pieces of food are easier to chew and swallow.  Eat moist foods. Avoid overly dry foods.  Avoid foods that: ? Are difficult to swallow, such as dry, chunky, crispy, or sticky foods. ? Are difficult to chew, such as hard, tough, or stringy foods. ? Contain nuts, seeds, or fruits.  Follow instructions from your dietitian about the types of liquids that are safe for you to swallow. You may be allowed to have: ? Thick liquids only. This includes only liquids that are thicker than honey. ? Thin and thick  liquids. This includes all beverages and foods that become liquid at room temperature.  To make thick liquids: ? Purchase a commercial liquid thickening powder. These are available at grocery stores and pharmacies. ? Mix the thickener into liquids according to instructions on the label. ? Purchase ready-made thickened liquids. ? Thicken soup by pureeing, straining to remove chunks, and adding flour, potato flakes, or corn starch. ? Add  commercial thickener to foods that become liquid at room temperature, such as milk shakes, yogurt, ice cream, gelatin, and sherbet.  Ask your health care provider whether you need to take a fiber supplement. Cooking  Cook meats so they stay tender and moist. Use methods like braising, stewing, or baking in liquid.  Cook vegetables and fruit until they are soft enough to be mashed with a fork.  Peel soft, fresh fruits such as peaches, nectarines, and melons.  When making soup, make sure chunks of meat and vegetables are smaller than  inch.  Reheat leftover foods slowly so that a tough crust does not form. What foods are allowed? The items listed below may not be a complete list. Talk with your dietitian about what dietary choices are best for you. Grains Breads, muffins, pancakes, or waffles moistened with syrup, jelly, or butter. Dry cereals well-moistened with milk. Moist, cooked cereals. Well-cooked pasta and rice. Vegetables All soft-cooked vegetables. Shredded lettuce. Fruits All canned and cooked fruits. Soft, peeled fresh fruits. Strawberries. Dairy Milk. Cream. Yogurt. Cottage cheese. Soft cheese without the rind. Meats and other protein foods Tender, moist ground meat, poultry, or fish. Meat cooked in gravy or sauces. Eggs. Sweets and desserts Ice cream. Milk shakes. Sherbet. Pudding. Fats and oils Butter. Margarine. Olive, canola, sunflower, and grapeseed oil. Smooth salad dressing. Smooth cream cheese. Mayonnaise. Gravy. What foods are not allowed? The items listed bemay not be a complete list. Talk with your dietitian about what dietary choices are best for you. Grains Coarse or dry cereals, such as bran, granola, and shredded wheat. Tough or chewy crusty breads, such as [place] bread or baguettes. Breads with nuts, seeds, or fruit. Vegetables All raw vegetables. Cooked corn. Cooked vegetables that are tough or stringy. Tough, crisp, fried potatoes and potato  skins. Fruits Fresh fruits with skins or seeds, or both, such as apples, pears, and grapes. Stringy, high-pulp fruits, such as papaya, pineapple, coconut, and mango. Fruit leather and all dried fruit. Dairy Yogurt with nuts or coconut. Meats and other protein foods Hard, dry sausages. Dry meat, poultry, or fish. Meats with gristle. Fish with bones. Fried meat or fish. Lunch meat and hotdogs. Nuts and seeds. Chunky peanut butter or other nut butters. Sweets and desserts Cakes or cookies that are very dry or chewy. Desserts with dried fruit, nuts, or coconut. Fried pastries. Very rich pastries. Fats and oils Cream cheese with fruit or nuts. Salad dressings with seeds or chunks. Summary  A soft-food eating plan includes foods that are safe and easy to swallow. Generally, the foods should be soft enough to be mashed with a fork.  Avoid foods that are dry, hard to chew, crunchy, sticky, stringy, or crispy.  Ask your health care provider whether you need to thicken your liquids and if you need to take a fiber supplement. This information is not intended to replace advice given to you by your health care provider. Make sure you discuss any questions you have with your health care provider. Document Released: 12/03/2007 Document Revised: 10/29/2016 Document Reviewed: 10/29/2016 Elsevier Interactive Patient Education  2019 Elsevier Inc. ° °

## 2019-02-12 NOTE — H&P (Signed)
Terry Allen is an 78 y.o. female.   Chief Complaint: Patient is here for EGD and ED. HPI: Patient is 78 year old Caucasian female with multiple medical problems including chronic GERD under went EGD with EGD in September last year when she was found to have esophageal web.  Esophagus was dilated by passing 48 [place] Maloney dilator.  She reports resolution of dysphagia but only lasted for 3 to 4 months.  She was seen in the office recently and underwent barium study which reveals very prominent esophageal valve and proximal esophagus.  Barium pill did not pass this narrow area.  She also complains of nausea and abdominal pain.  She has chronic pelvic pain for which she takes gabapentin and amitriptyline.  She also complains of epigastric pain.  She says she has lost 8 pounds this year.  She apparently has been evaluated for abdominal pain in the past.  Noted she had CT in 2018 also had CT in 2016.  She states she has IBS but she denies melena or rectal bleeding.  She has chronic low back pain and has been on meloxicam and therefore may have peptic ulcer disease.  Past Medical History:  Diagnosis Date  . Acid reflux    Patient reports Barretts  . Bulging lumbar disc   . High cholesterol   . Hypothyroidism   . Nerve damage   . Osteopenia   . Pelvic pain in female    due to bladder sling    Past Surgical History:  Procedure Laterality Date  . APPENDECTOMY  1960  . BLADDER SUSPENSION    . ESOPHAGEAL DILATION N/A 05/27/2018   Procedure: ESOPHAGEAL DILATION;  Surgeon: Rogene Houston, MD;  Location: [facility];  Service: Endoscopy;  Laterality: N/A;  . ESOPHAGOGASTRODUODENOSCOPY N/A 12/21/2014   Procedure: ESOPHAGOGASTRODUODENOSCOPY (EGD);  Surgeon: Rogene Houston, MD;  Location: [facility];  Service: Endoscopy;  Laterality: N/A;  210  . ESOPHAGOGASTRODUODENOSCOPY N/A 06/16/2015   Procedure: ESOPHAGOGASTRODUODENOSCOPY (EGD);  Surgeon: Rogene Houston, MD;  Location: [facility];   Service: Endoscopy;  Laterality: N/A;  1250  . ESOPHAGOGASTRODUODENOSCOPY N/A 05/27/2018   Procedure: ESOPHAGOGASTRODUODENOSCOPY (EGD);  Surgeon: Rogene Houston, MD;  Location: [facility];  Service: Endoscopy;  Laterality: N/A;  7:30    Family History  Problem Relation Age of Onset  . Breast cancer Mother   . Heart disease Mother   . Diabetes Mother   . Cancer Mother        breast  . Stroke Mother   . Hypertension Mother   . Lung cancer Father   . Stroke Father   . Cancer Father        oat cell cancer/lung  . Kidney cancer Brother   . Cancer Brother        renal cell cancer  . Healthy Brother   . Diabetes Son    Social History:  reports that she has never smoked. She has never used smokeless tobacco. She reports that she does not drink alcohol or use drugs.  Allergies: No Known Allergies  Medications Prior to Admission  Medication Sig Dispense Refill  . acetaminophen (TYLENOL) 500 MG tablet Take 1,000 mg by mouth every 6 (six) hours as needed (for pain.).     Marland Kitchen amitriptyline (ELAVIL) 25 MG tablet Take 25 mg by mouth at bedtime.  2  . atorvastatin (LIPITOR) 40 MG tablet Take 40 mg by mouth at bedtime.     . baclofen (LIORESAL) 10 MG tablet  Take 10 mg by mouth 2 (two) times a day.    . Calcium Carb-Cholecalciferol (CALCIUM 600 + D PO) Take 1 tablet by mouth 2 (two) times a day.    . gabapentin (NEURONTIN) 300 MG capsule Take 2 capsules (600 mg total) by mouth 3 (three) times daily. (Patient taking differently: Take 300 mg by mouth 3 (three) times daily after meals. ) 540 capsule 4  . HYDROcodone-acetaminophen (NORCO/VICODIN) 5-325 MG tablet Take 1 tablet by mouth every 4 (four) hours as needed for pain.    Marland Kitchen levothyroxine (SYNTHROID, LEVOTHROID) 50 MCG tablet Take 50 mcg by mouth daily before breakfast.   4  . meloxicam (MOBIC) 15 MG tablet Take 15 mg by mouth daily with breakfast.   5  . Multiple Vitamin (MULTIVITAMIN WITH MINERALS) TABS tablet Take 1 tablet by mouth daily.      Marland Kitchen omeprazole (PRILOSEC) 40 MG capsule Take 40 mg by mouth 2 (two) times a day.    . oxybutynin (DITROPAN-XL) 10 MG 24 hr tablet Take 10 mg by mouth daily.    Marland Kitchen trimethoprim (TRIMPEX) 100 MG tablet Take 100 mg by mouth at bedtime.    . pantoprazole (PROTONIX) 40 MG tablet TAKE ONE TABLET BY MOUTH ONCE DAILY (Patient not taking: No sig reported) 30 tablet 3    Results for orders placed or performed during the hospital encounter of 02/12/19 (from the past 48 hour(s))  SARS Coronavirus 2 (CEPHEID - Performed in [place] hospital lab), Hosp Order     Status: None   Collection Time: 02/11/19 11:10 AM  Result Value Ref Range   SARS Coronavirus 2 NEGATIVE NEGATIVE    Comment: (NOTE) If result is NEGATIVE SARS-CoV-2 target nucleic acids are NOT DETECTED. The SARS-CoV-2 RNA is generally detectable in upper and lower  respiratory specimens during the acute phase of infection. The lowest  concentration of SARS-CoV-2 viral copies this assay can detect is 250  copies / mL. A negative result does not preclude SARS-CoV-2 infection  and should not be used as the sole basis for treatment or other  patient management decisions.  A negative result may occur with  improper specimen collection / handling, submission of specimen other  than nasopharyngeal swab, presence of viral mutation(s) within the  areas targeted by this assay, and inadequate number of viral copies  (<250 copies / mL). A negative result must be combined with clinical  observations, patient history, and epidemiological information. If result is POSITIVE SARS-CoV-2 target nucleic acids are DETECTED. The SARS-CoV-2 RNA is generally detectable in upper and lower  respiratory specimens dur ing the acute phase of infection.  Positive  results are indicative of active infection with SARS-CoV-2.  Clinical  correlation with patient history and other diagnostic information is  necessary to determine patient infection status.  Positive  results do  not rule out bacterial infection or co-infection with other viruses. If result is PRESUMPTIVE POSTIVE SARS-CoV-2 nucleic acids MAY BE PRESENT.   A presumptive positive result was obtained on the submitted specimen  and confirmed on repeat testing.  While 2019 novel coronavirus  (SARS-CoV-2) nucleic acids may be present in the submitted sample  additional confirmatory testing may be necessary for epidemiological  and / or clinical management purposes  to differentiate between  SARS-CoV-2 and other Sarbecovirus currently known to infect humans.  If clinically indicated additional testing with an alternate test  methodology [phone]) is advised. The SARS-CoV-2 RNA is generally  detectable in upper and lower respiratory sp ecimens  during the acute  phase of infection. The expected result is Negative. Fact Sheet for Patients:  StrictlyIdeas.no Fact Sheet for Healthcare Providers: BankingDealers.co.za This test is not yet approved or cleared by the [place] FDA and has been authorized for detection and/or diagnosis of SARS-CoV-2 by FDA under an Emergency Use Authorization (EUA).  This EUA will remain in effect (meaning this test can be used) for the duration of the COVID-19 declaration under Section 564(b)(1) of the Act, 21 U.S.C. section 360bbb-3(b)(1), unless the authorization is terminated or revoked sooner. Performed at [facility], [address] [place], [place] [address]    No results found.  ROS  Blood pressure 111/90, pulse 77, temperature 98.5 F (36.9 C), temperature source Oral, resp. rate 19, height 5\' 2"  (1.575 m), weight 50.8 kg, SpO2 100 %. Physical Exam  Constitutional:  Well-developed thin Caucasian female in NAD.  HENT:  Mouth/Throat: Oropharynx is clear and moist.  Eyes: Conjunctivae are normal. No scleral icterus.  Neck: No thyromegaly present.  Cardiovascular: Normal rate, regular rhythm and  normal heart sounds.  No murmur heard. Respiratory: Effort normal and breath sounds normal.  GI:  Abdomen is symmetrical soft with mild midepigastric tenderness no organomegaly or masses noted.  Musculoskeletal:        General: No edema.  Lymphadenopathy:    She has no cervical adenopathy.  Neurological: She is alert.  Skin: Skin is warm and dry.     Assessment/Plan Esophageal dysphagia secondary to recurrent esophageal web and proximal esophagus. Nausea and chronic abdominal pain. EGD with ED.  Hildred Laser, MD 02/12/2019, 1:12 PM

## 2019-02-15 ENCOUNTER — Other Ambulatory Visit (INDEPENDENT_AMBULATORY_CARE_PROVIDER_SITE_OTHER): Payer: Self-pay | Admitting: *Deleted

## 2019-02-15 DIAGNOSIS — R1310 Dysphagia, unspecified: Secondary | ICD-10-CM

## 2019-02-16 DIAGNOSIS — E039 Hypothyroidism, unspecified: Secondary | ICD-10-CM | POA: Diagnosis not present

## 2019-02-16 DIAGNOSIS — E785 Hyperlipidemia, unspecified: Secondary | ICD-10-CM | POA: Diagnosis not present

## 2019-02-16 DIAGNOSIS — K219 Gastro-esophageal reflux disease without esophagitis: Secondary | ICD-10-CM | POA: Diagnosis not present

## 2019-02-19 ENCOUNTER — Encounter (HOSPITAL_COMMUNITY): Payer: Self-pay | Admitting: Internal Medicine

## 2019-02-24 DIAGNOSIS — R5381 Other malaise: Secondary | ICD-10-CM | POA: Diagnosis not present

## 2019-02-24 DIAGNOSIS — E860 Dehydration: Secondary | ICD-10-CM | POA: Diagnosis not present

## 2019-02-24 DIAGNOSIS — E440 Moderate protein-calorie malnutrition: Secondary | ICD-10-CM | POA: Diagnosis not present

## 2019-02-24 DIAGNOSIS — R3989 Other symptoms and signs involving the genitourinary system: Secondary | ICD-10-CM | POA: Diagnosis not present

## 2019-02-24 DIAGNOSIS — E785 Hyperlipidemia, unspecified: Secondary | ICD-10-CM | POA: Diagnosis not present

## 2019-02-24 DIAGNOSIS — R630 Anorexia: Secondary | ICD-10-CM | POA: Diagnosis not present

## 2019-02-24 DIAGNOSIS — E039 Hypothyroidism, unspecified: Secondary | ICD-10-CM | POA: Diagnosis not present

## 2019-02-24 DIAGNOSIS — K2270 Barrett's esophagus without dysplasia: Secondary | ICD-10-CM | POA: Diagnosis not present

## 2019-02-24 DIAGNOSIS — R1013 Epigastric pain: Secondary | ICD-10-CM | POA: Diagnosis not present

## 2019-02-24 DIAGNOSIS — K089 Disorder of teeth and supporting structures, unspecified: Secondary | ICD-10-CM | POA: Diagnosis not present

## 2019-02-24 DIAGNOSIS — R110 Nausea: Secondary | ICD-10-CM | POA: Diagnosis not present

## 2019-02-24 DIAGNOSIS — Z6820 Body mass index (BMI) 20.0-20.9, adult: Secondary | ICD-10-CM | POA: Diagnosis not present

## 2019-02-24 DIAGNOSIS — R1311 Dysphagia, oral phase: Secondary | ICD-10-CM | POA: Diagnosis not present

## 2019-02-24 DIAGNOSIS — N3020 Other chronic cystitis without hematuria: Secondary | ICD-10-CM | POA: Diagnosis not present

## 2019-02-26 ENCOUNTER — Other Ambulatory Visit (INDEPENDENT_AMBULATORY_CARE_PROVIDER_SITE_OTHER): Payer: Self-pay | Admitting: Internal Medicine

## 2019-02-26 ENCOUNTER — Ambulatory Visit (HOSPITAL_COMMUNITY)
Admission: RE | Admit: 2019-02-26 | Discharge: 2019-02-26 | Disposition: A | Discharge status: Home / Self Care | Payer: PPO | Source: Ambulatory Visit | Attending: Internal Medicine | Admitting: Internal Medicine

## 2019-02-26 ENCOUNTER — Other Ambulatory Visit: Payer: Self-pay

## 2019-02-26 DIAGNOSIS — R1310 Dysphagia, unspecified: Secondary | ICD-10-CM

## 2019-02-26 DIAGNOSIS — E782 Mixed hyperlipidemia: Secondary | ICD-10-CM | POA: Diagnosis not present

## 2019-02-26 DIAGNOSIS — R1311 Dysphagia, oral phase: Secondary | ICD-10-CM | POA: Diagnosis not present

## 2019-02-26 DIAGNOSIS — R110 Nausea: Secondary | ICD-10-CM | POA: Diagnosis not present

## 2019-02-26 DIAGNOSIS — E039 Hypothyroidism, unspecified: Secondary | ICD-10-CM | POA: Diagnosis not present

## 2019-02-26 DIAGNOSIS — R1013 Epigastric pain: Secondary | ICD-10-CM | POA: Diagnosis not present

## 2019-03-15 ENCOUNTER — Other Ambulatory Visit (HOSPITAL_COMMUNITY): Payer: Self-pay | Admitting: Internal Medicine

## 2019-03-15 DIAGNOSIS — Z1231 Encounter for screening mammogram for malignant neoplasm of breast: Secondary | ICD-10-CM

## 2019-03-24 ENCOUNTER — Other Ambulatory Visit: Payer: Self-pay

## 2019-03-24 ENCOUNTER — Ambulatory Visit (HOSPITAL_COMMUNITY): Payer: PPO

## 2019-03-24 ENCOUNTER — Ambulatory Visit (HOSPITAL_COMMUNITY)
Admission: RE | Admit: 2019-03-24 | Discharge: 2019-03-24 | Disposition: A | Discharge status: Home / Self Care | Payer: PPO | Source: Ambulatory Visit | Attending: Internal Medicine | Admitting: Internal Medicine

## 2019-03-24 DIAGNOSIS — Z1231 Encounter for screening mammogram for malignant neoplasm of breast: Secondary | ICD-10-CM

## 2019-03-26 DIAGNOSIS — K219 Gastro-esophageal reflux disease without esophagitis: Secondary | ICD-10-CM | POA: Diagnosis not present

## 2019-03-26 DIAGNOSIS — E785 Hyperlipidemia, unspecified: Secondary | ICD-10-CM | POA: Diagnosis not present

## 2019-03-26 DIAGNOSIS — K2270 Barrett's esophagus without dysplasia: Secondary | ICD-10-CM | POA: Diagnosis not present

## 2019-03-26 DIAGNOSIS — E039 Hypothyroidism, unspecified: Secondary | ICD-10-CM | POA: Diagnosis not present

## 2019-03-29 ENCOUNTER — Other Ambulatory Visit (HOSPITAL_COMMUNITY): Payer: Self-pay | Admitting: Internal Medicine

## 2019-03-29 DIAGNOSIS — R928 Other abnormal and inconclusive findings on diagnostic imaging of breast: Secondary | ICD-10-CM

## 2019-03-30 ENCOUNTER — Ambulatory Visit (HOSPITAL_COMMUNITY)
Admission: RE | Admit: 2019-03-30 | Discharge: 2019-03-30 | Disposition: A | Discharge status: Home / Self Care | Payer: PPO | Source: Ambulatory Visit | Attending: Internal Medicine | Admitting: Internal Medicine

## 2019-03-30 ENCOUNTER — Other Ambulatory Visit: Payer: Self-pay

## 2019-03-30 DIAGNOSIS — N6321 Unspecified lump in the left breast, upper outer quadrant: Secondary | ICD-10-CM | POA: Diagnosis not present

## 2019-03-30 DIAGNOSIS — R928 Other abnormal and inconclusive findings on diagnostic imaging of breast: Secondary | ICD-10-CM

## 2019-03-30 DIAGNOSIS — N6012 Diffuse cystic mastopathy of left breast: Secondary | ICD-10-CM | POA: Diagnosis not present

## 2019-04-12 DIAGNOSIS — E039 Hypothyroidism, unspecified: Secondary | ICD-10-CM | POA: Diagnosis not present

## 2019-04-12 DIAGNOSIS — K219 Gastro-esophageal reflux disease without esophagitis: Secondary | ICD-10-CM | POA: Diagnosis not present

## 2019-04-12 DIAGNOSIS — K2270 Barrett's esophagus without dysplasia: Secondary | ICD-10-CM | POA: Diagnosis not present

## 2019-04-12 DIAGNOSIS — E785 Hyperlipidemia, unspecified: Secondary | ICD-10-CM | POA: Diagnosis not present

## 2019-05-06 DIAGNOSIS — H25813 Combined forms of age-related cataract, bilateral: Secondary | ICD-10-CM | POA: Diagnosis not present

## 2019-05-06 DIAGNOSIS — H25811 Combined forms of age-related cataract, right eye: Secondary | ICD-10-CM | POA: Diagnosis not present

## 2019-05-12 DIAGNOSIS — E039 Hypothyroidism, unspecified: Secondary | ICD-10-CM | POA: Diagnosis not present

## 2019-05-12 DIAGNOSIS — E785 Hyperlipidemia, unspecified: Secondary | ICD-10-CM | POA: Diagnosis not present

## 2019-05-12 DIAGNOSIS — K2270 Barrett's esophagus without dysplasia: Secondary | ICD-10-CM | POA: Diagnosis not present

## 2019-05-12 DIAGNOSIS — K219 Gastro-esophageal reflux disease without esophagitis: Secondary | ICD-10-CM | POA: Diagnosis not present

## 2019-05-26 NOTE — H&P (Signed)
Surgical History & Physical  Patient Name: Terry Allen DOB: [birth year]  Surgery: Cataract extraction with intraocular lens implant phacoemulsification; Right Eye  Surgeon: Baruch Goldmann MD Surgery Date:  06/07/2019 Pre-Op Date:  05/26/2019  HPI: A 63 Yr. old female patient Cataract eval per Dr. Jorja Loa 1. 1. The patient complains of difficulty when driving, which began 1 years ago. Both eyes are affected. The patient describes glare and hazy symptoms affecting their eyes/vision. The condition's severity decreased since last visit with Dr. Jorja Loa. Symptoms occur when the patient is driving and reading. Patient d/c driving at night due to significant glare and halo with oncoming car lights. OD worse c/w OS overall. This is negatively affecting the patient's quality of life.No significant or sudden changes noted. Patient had new glasses made with [employer] ~ 3 months ago. Sees better without glasses for distance and reading. Occasional binocular diplopia with and without glasses. *H/O hypertensive retinopathy. HPI was performed by Baruch Goldmann .  Medical History: Cataracts Hypertensive Retinopathy LDL Thyroid Problems bladder sling Acid reflux  Review of Systems Negative Allergic/Immunologic Negative Cardiovascular Negative Constitutional Negative Ear, Nose, Mouth & Throat Negative Endocrine Negative Eyes Negative Gastrointestinal Negative Genitourinary Negative Hemotologic/Lymphatic Negative Integumentary Negative Musculoskeletal Negative Neurological Negative Psychiatry Negative Respiratory  Social   Never smoked   Medication Levothyroxine Sodium, Atorvastatin, Trimethoprim, Gabapentin, Omeprazole, Calcium, Oxybutynin Chloride,   Sx/Procedures Bladder Sling,   Drug Allergies   NKDA  History & Physical: Heent:  Cataract, Right eye NECK: supple without bruits LUNGS: lungs clear to auscultation CV: regular rate and rhythm Abdomen: soft and non-tender  Impression &  Plan: Assessment: 1.  COMBINED FORMS AGE RELATED CATARACT; Both Eyes (H25.813)  Plan: 1.  Cataract accounts for the patient's decreased vision. This visual impairment is not correctable with a tolerable change in glasses or contact lenses. Cataract surgery with an implantation of a new lens should significantly improve the visual and functional status of the patient. Discussed all risks, benefits, alternatives, and potential complications. Discussed the procedures and recovery. Patient desires to have surgery. A-scan ordered and performed today for intra-ocular lens calculations. The surgery will be performed in order to improve vision for driving, reading, and for eye examinations. Recommend phacoemulsification with intra-ocular lens. Right eye worse - first. Dilates well - shugarcaine by protocol.

## 2019-05-31 ENCOUNTER — Ambulatory Visit (INDEPENDENT_AMBULATORY_CARE_PROVIDER_SITE_OTHER): Payer: PPO | Admitting: Nurse Practitioner

## 2019-05-31 DIAGNOSIS — R110 Nausea: Secondary | ICD-10-CM | POA: Diagnosis not present

## 2019-05-31 DIAGNOSIS — R1013 Epigastric pain: Secondary | ICD-10-CM | POA: Diagnosis not present

## 2019-06-02 ENCOUNTER — Other Ambulatory Visit: Payer: Self-pay

## 2019-06-02 ENCOUNTER — Encounter (HOSPITAL_COMMUNITY): Payer: Self-pay

## 2019-06-03 ENCOUNTER — Other Ambulatory Visit (HOSPITAL_COMMUNITY)
Admission: RE | Admit: 2019-06-03 | Discharge: 2019-06-03 | Disposition: A | Discharge status: Home / Self Care | Payer: PPO | Source: Ambulatory Visit | Attending: Ophthalmology | Admitting: Ophthalmology

## 2019-06-03 ENCOUNTER — Encounter (HOSPITAL_COMMUNITY)
Admission: RE | Admit: 2019-06-03 | Discharge: 2019-06-03 | Disposition: A | Discharge status: Home / Self Care | Payer: PPO | Source: Ambulatory Visit | Attending: Ophthalmology | Admitting: Ophthalmology

## 2019-06-03 DIAGNOSIS — Z20828 Contact with and (suspected) exposure to other viral communicable diseases: Secondary | ICD-10-CM | POA: Insufficient documentation

## 2019-06-03 DIAGNOSIS — Z01812 Encounter for preprocedural laboratory examination: Secondary | ICD-10-CM | POA: Insufficient documentation

## 2019-06-03 LAB — SARS CORONAVIRUS 2 (TAT 6-24 HRS): SARS Coronavirus 2: NEGATIVE

## 2019-06-06 ENCOUNTER — Encounter (HOSPITAL_COMMUNITY): Payer: Self-pay

## 2019-06-06 ENCOUNTER — Emergency Department (HOSPITAL_COMMUNITY)
Admission: EM | Admit: 2019-06-06 | Discharge: 2019-06-06 | Discharge status: Against Medical Advice | Payer: PPO | Attending: Emergency Medicine | Admitting: Emergency Medicine

## 2019-06-06 ENCOUNTER — Other Ambulatory Visit: Payer: Self-pay

## 2019-06-06 DIAGNOSIS — Z5321 Procedure and treatment not carried out due to patient leaving prior to being seen by health care provider: Secondary | ICD-10-CM | POA: Diagnosis not present

## 2019-06-06 DIAGNOSIS — R5381 Other malaise: Secondary | ICD-10-CM | POA: Diagnosis present

## 2019-06-06 NOTE — ED Triage Notes (Signed)
Pt states that she is "sick, Just sick. " unable to go into any detail on how she is feeling . Denies GU/GI/resp symptoms.  Said she has been feeling "sick" all day

## 2019-06-06 NOTE — ED Triage Notes (Signed)
Denies any pain.

## 2019-06-07 ENCOUNTER — Encounter (HOSPITAL_COMMUNITY)
Admission: RE | Disposition: A | Discharge status: Home / Self Care | Payer: Self-pay | Source: Home / Self Care | Attending: Ophthalmology

## 2019-06-07 ENCOUNTER — Other Ambulatory Visit: Payer: Self-pay

## 2019-06-07 ENCOUNTER — Ambulatory Visit (HOSPITAL_COMMUNITY): Payer: PPO | Admitting: Anesthesiology

## 2019-06-07 ENCOUNTER — Encounter (HOSPITAL_COMMUNITY): Payer: Self-pay

## 2019-06-07 ENCOUNTER — Ambulatory Visit (HOSPITAL_COMMUNITY)
Admission: RE | Admit: 2019-06-07 | Discharge: 2019-06-07 | Disposition: A | Discharge status: Home / Self Care | Payer: PPO | Attending: Ophthalmology | Admitting: Ophthalmology

## 2019-06-07 DIAGNOSIS — H532 Diplopia: Secondary | ICD-10-CM | POA: Insufficient documentation

## 2019-06-07 DIAGNOSIS — Z79899 Other long term (current) drug therapy: Secondary | ICD-10-CM | POA: Insufficient documentation

## 2019-06-07 DIAGNOSIS — H25811 Combined forms of age-related cataract, right eye: Secondary | ICD-10-CM | POA: Diagnosis not present

## 2019-06-07 DIAGNOSIS — K219 Gastro-esophageal reflux disease without esophagitis: Secondary | ICD-10-CM | POA: Diagnosis not present

## 2019-06-07 DIAGNOSIS — E039 Hypothyroidism, unspecified: Secondary | ICD-10-CM | POA: Diagnosis not present

## 2019-06-07 HISTORY — PX: CATARACT EXTRACTION W/PHACO: SHX586

## 2019-06-07 SURGERY — PHACOEMULSIFICATION, CATARACT, WITH IOL INSERTION
Anesthesia: Monitor Anesthesia Care | Site: Eye | Laterality: Right

## 2019-06-07 MED ORDER — PROVISC 10 MG/ML IO SOLN
INTRAOCULAR | Status: DC | PRN
  Administered 2019-06-07: 0.85 mL via INTRAOCULAR

## 2019-06-07 MED ORDER — LACTATED RINGERS IV SOLN: INTRAVENOUS | Status: DC

## 2019-06-07 MED ORDER — EPINEPHRINE PF 1 MG/ML IJ SOLN
INTRAMUSCULAR | Status: AC
  Filled 2019-06-07: qty 2

## 2019-06-07 MED ORDER — BSS IO SOLN
INTRAOCULAR | Status: DC | PRN
  Administered 2019-06-07: 15 mL via INTRAOCULAR

## 2019-06-07 MED ORDER — POVIDONE-IODINE 5 % OP SOLN
OPHTHALMIC | Status: DC | PRN
  Administered 2019-06-07: 1 via OPHTHALMIC

## 2019-06-07 MED ORDER — PHENYLEPHRINE HCL 2.5 % OP SOLN
1.0000 [drp] | OPHTHALMIC | Status: AC | PRN
  Administered 2019-06-07 (×3): 1 [drp] via OPHTHALMIC

## 2019-06-07 MED ORDER — CYCLOPENTOLATE-PHENYLEPHRINE 0.2-1 % OP SOLN
1.0000 [drp] | OPHTHALMIC | Status: AC | PRN
  Administered 2019-06-07 (×3): 1 [drp] via OPHTHALMIC

## 2019-06-07 MED ORDER — EPINEPHRINE PF 1 MG/ML IJ SOLN
INTRAOCULAR | Status: DC | PRN
  Administered 2019-06-07: 500 mL

## 2019-06-07 MED ORDER — LIDOCAINE HCL (PF) 1 % IJ SOLN
INTRAOCULAR | Status: DC | PRN
  Administered 2019-06-07: 1 mL via OPHTHALMIC

## 2019-06-07 MED ORDER — TETRACAINE HCL 0.5 % OP SOLN
1.0000 [drp] | OPHTHALMIC | Status: AC | PRN
  Administered 2019-06-07 (×3): 1 [drp] via OPHTHALMIC

## 2019-06-07 MED ORDER — NEOMYCIN-POLYMYXIN-DEXAMETH 3.5-10000-0.1 OP SUSP
OPHTHALMIC | Status: DC | PRN
  Administered 2019-06-07: 2 [drp] via OPHTHALMIC

## 2019-06-07 MED ORDER — LIDOCAINE HCL 3.5 % OP GEL
1.0000 "application " | Freq: Once | OPHTHALMIC | Status: AC
  Administered 2019-06-07: 1 via OPHTHALMIC

## 2019-06-07 MED ORDER — EPINEPHRINE PF 1 MG/ML IJ SOLN
INTRAMUSCULAR | Status: AC
  Filled 2019-06-07: qty 1

## 2019-06-07 MED ORDER — SODIUM HYALURONATE 23 MG/ML IO SOLN
INTRAOCULAR | Status: DC | PRN
  Administered 2019-06-07: 0.6 mL via INTRAOCULAR

## 2019-06-07 SURGICAL SUPPLY — 14 items
CLOTH BEACON ORANGE TIMEOUT ST (SAFETY) ×1 IMPLANT
EYE SHIELD UNIVERSAL CLEAR (GAUZE/BANDAGES/DRESSINGS) ×1 IMPLANT
GLOVE BIOGEL PI IND STRL 7.0 (GLOVE) IMPLANT
GLOVE BIOGEL PI INDICATOR 7.0 (GLOVE) ×2
LENS ALC ACRYL/TECN (Ophthalmic Related) ×1 IMPLANT
NDL HYPO 18GX1.5 BLUNT FILL (NEEDLE) IMPLANT
NEEDLE HYPO 18GX1.5 BLUNT FILL (NEEDLE) ×2 IMPLANT
PAD ARMBOARD 7.5X6 YLW CONV (MISCELLANEOUS) ×1 IMPLANT
RING MALYGIN (MISCELLANEOUS) IMPLANT
SYR TB 1ML LL NO SAFETY (SYRINGE) ×1 IMPLANT
TAPE SURG TRANSPORE 1 IN (GAUZE/BANDAGES/DRESSINGS) IMPLANT
TAPE SURGICAL TRANSPORE 1 IN (GAUZE/BANDAGES/DRESSINGS) ×1
VISCOELASTIC ADDITIONAL (OPHTHALMIC RELATED) ×1 IMPLANT
WATER STERILE IRR 250ML POUR (IV SOLUTION) ×1 IMPLANT

## 2019-06-07 NOTE — Anesthesia Postprocedure Evaluation (Signed)
Anesthesia Post Note  Patient: Terry Allen  Procedure(s) Performed: CATARACT EXTRACTION PHACO AND INTRAOCULAR LENS PLACEMENT (IOC) (Right Eye)  Patient location during evaluation: Short Stay Anesthesia Type: MAC Level of consciousness: awake and alert, oriented and patient cooperative Pain management: pain level controlled Vital Signs Assessment: post-procedure vital signs reviewed and stable Respiratory status: spontaneous breathing Cardiovascular status: stable Postop Assessment: no apparent nausea or vomiting Anesthetic complications: no     Last Vitals:  Vitals:   06/07/19 0705  BP: (!) 146/69  Pulse: 63  Resp: 16  Temp: 36.8 C  SpO2: 100%    Last Pain:  Vitals:   06/07/19 0705  TempSrc: Oral  PainSc: 0-No pain                 ADAMS, AMY A

## 2019-06-07 NOTE — Transfer of Care (Signed)
Immediate Anesthesia Transfer of Care Note  Patient: Terry Allen  Procedure(s) Performed: CATARACT EXTRACTION PHACO AND INTRAOCULAR LENS PLACEMENT (I[facility]) (Right Eye)  Patient Location: Short Stay  Anesthesia Type:MAC  Level of Consciousness: awake, alert , oriented and patient cooperative  Airway & Oxygen Therapy: Patient Spontanous Breathing  Post-op Assessment: Report given to RN and Post -op Vital signs reviewed and stable  Post vital signs: Reviewed and stable  Last Vitals:  Vitals Value Taken Time  BP    Temp    Pulse    Resp    SpO2      Last Pain:  Vitals:   06/07/19 0705  TempSrc: Oral  PainSc: 0-No pain      Patients Stated Pain Goal: 7 (56/70/14 1030)  Complications: No apparent anesthesia complications

## 2019-06-07 NOTE — Anesthesia Preprocedure Evaluation (Signed)
Anesthesia Evaluation  Patient identified by MRN, date of birth, ID band Patient awake    Reviewed: Allergy & Precautions, NPO status , Patient's Chart, lab work & pertinent test results  Airway Mallampati: III  TM Distance: >3 FB Neck ROM: Full    Dental no notable dental hx. (+) Teeth Intact   Pulmonary neg pulmonary ROS,    Pulmonary exam normal breath sounds clear to auscultation       Cardiovascular Exercise Tolerance: Good negative cardio ROS Normal cardiovascular examI Rhythm:Regular Rate:Normal     Neuro/Psych negative neurological ROS  negative psych ROS   GI/Hepatic Neg liver ROS, GERD  Medicated and Controlled,  Endo/Other  Hypothyroidism   Renal/GU negative Renal ROS  negative genitourinary   Musculoskeletal negative musculoskeletal ROS (+)   Abdominal   Peds negative pediatric ROS (+)  Hematology negative hematology ROS (+)   Anesthesia Other Findings   Reproductive/Obstetrics negative OB ROS                             Anesthesia Physical Anesthesia Plan  ASA: II  Anesthesia Plan: MAC   Post-op Pain Management:    Induction: Intravenous  PONV Risk Score and Plan: 2 and TIVA, Ondansetron and Treatment may vary due to age or medical condition  Airway Management Planned: Nasal Cannula and Simple Face Mask  Additional Equipment:   Intra-op Plan:   Post-operative Plan:   Informed Consent: I have reviewed the patients History and Physical, chart, labs and discussed the procedure including the risks, benefits and alternatives for the proposed anesthesia with the patient or authorized representative who has indicated his/her understanding and acceptance.     Dental advisory given  Plan Discussed with: CRNA  Anesthesia Plan Comments: (Plan Full PPE use  Plan MAC as tolerated, with GETA as needed d/w pt -WTP with same after Q&A)        Anesthesia Quick  Evaluation

## 2019-06-07 NOTE — Discharge Instructions (Addendum)
Please discharge patient when stable, will follow up today with Dr. Wrzosek at the [facility] office immediately following discharge.  Leave shield in place until visit.  All paperwork with discharge instructions will be given at the office. ° ° °PATIENT INSTRUCTIONS °POST-ANESTHESIA ° °IMMEDIATELY FOLLOWING SURGERY:  Do not drive or operate machinery for the first twenty four hours after surgery.  Do not make any important decisions for twenty four hours after surgery or while taking narcotic pain medications or sedatives.  If you develop intractable nausea and vomiting or a severe headache please notify your doctor immediately. ° °FOLLOW-UP:  Please make an appointment with your surgeon as instructed. You do not need to follow up with anesthesia unless specifically instructed to do so. ° °WOUND CARE INSTRUCTIONS (if applicable):  Keep a dry clean dressing on the anesthesia/puncture wound site if there is drainage.  Once the wound has quit draining you may leave it open to air.  Generally you should leave the bandage intact for twenty four hours unless there is drainage.  If the epidural site drains for more than 36-48 hours please call the anesthesia department. ° °QUESTIONS?:  Please feel free to call your physician or the hospital operator if you have any questions, and they will be happy to assist you.    ° ° ° °

## 2019-06-07 NOTE — Interval H&P Note (Signed)
History and Physical Interval Note: The H and P was reviewed and updated. The patient was examined.  No changes were found after exam.  The surgical eye was marked.  06/07/2019 7:52 AM  Terry Allen  has presented today for surgery, with the diagnosis of Nuclear sclerotic cataract - Right eye.  The various methods of treatment have been discussed with the patient and family. After consideration of risks, benefits and other options for treatment, the patient has consented to  Procedure(s) with comments: CATARACT EXTRACTION PHACO AND INTRAOCULAR LENS PLACEMENT (IOC) (Right) - right as a surgical intervention.  The patient's history has been reviewed, patient examined, no change in status, stable for surgery.  I have reviewed the patient's chart and labs.  Questions were answered to the patient's satisfaction.     Baruch Goldmann

## 2019-06-07 NOTE — Anesthesia Procedure Notes (Signed)
Procedure Name: MAC Date/Time: 06/07/2019 7:56 AM Performed by: Andree Elk Zorion Nims A, CRNA Pre-anesthesia Checklist: Patient identified, Emergency Drugs available, Suction available, Timeout performed and Patient being monitored Patient Re-evaluated:Patient Re-evaluated prior to induction Oxygen Delivery Method: Nasal Cannula

## 2019-06-07 NOTE — Op Note (Signed)
Date of procedure: 06/07/19  Pre-operative diagnosis:  Visually significant combined form age-related cataract, Right Eye (H25.811)  Post-operative diagnosis:  Visually significant combined form age-related cataract, Right Eye (H25.811)  Procedure: Removal of cataract via phacoemulsification and insertion of intra-ocular lens [employer] PCB00  +24.5D into the capsular bag of the Right Eye  Attending surgeon: Gerda Diss. Hilman Kissling, MD, MA  Anesthesia: MAC, Topical Akten  Complications: None  Estimated Blood Loss: <63m (minimal)  Specimens: None  Implants: As above  Indications:  Visually significant age-related cataract, Right Eye  Procedure:  The patient was seen and identified in the pre-operative area. The operative eye was identified and dilated.  The operative eye was marked.  Topical anesthesia was administered to the operative eye.     The patient was then to the operative suite and placed in the supine position.  A timeout was performed confirming the patient, procedure to be performed, and all other relevant information.   The patient's face was prepped and draped in the usual fashion for intra-ocular surgery.  A lid speculum was placed into the operative eye and the surgical microscope moved into place and focused.  A superotemporal paracentesis was created using a 20 gauge paracentesis blade.  Shugarcaine was injected into the anterior chamber.  Viscoelastic was injected into the anterior chamber.  A temporal clear-corneal main wound incision was created using a 2.483mmicrokeratome.  A continuous curvilinear capsulorrhexis was initiated using an irrigating cystitome and completed using capsulorrhexis forceps.  Hydrodissection and hydrodeliniation were performed.  Viscoelastic was injected into the anterior chamber.  A phacoemulsification handpiece and a chopper as a second instrument were used to remove the nucleus and epinucleus. The irrigation/aspiration handpiece was  used to remove any remaining cortical material.   The capsular bag was reinflated with viscoelastic, checked, and found to be intact.  The intraocular lens was inserted into the capsular bag.  The irrigation/aspiration handpiece was used to remove any remaining viscoelastic.  The clear corneal wound and paracentesis wounds were then hydrated and checked with Weck-Cels to be watertight.  The lid-speculum and drape was removed, and the patient's face was cleaned with a wet and dry 4x4.  Maxitrol was instilled in the eye before a clear shield was taped over the eye. The patient was taken to the post-operative care unit in good condition, having tolerated the procedure well.  Post-Op Instructions: [facility] and will receive all other orders and instructions.

## 2019-06-08 ENCOUNTER — Other Ambulatory Visit: Payer: Self-pay | Admitting: Internal Medicine

## 2019-06-08 ENCOUNTER — Other Ambulatory Visit (HOSPITAL_COMMUNITY): Payer: Self-pay | Admitting: Internal Medicine

## 2019-06-08 ENCOUNTER — Encounter (HOSPITAL_COMMUNITY): Payer: Self-pay | Admitting: Ophthalmology

## 2019-06-08 ENCOUNTER — Ambulatory Visit (HOSPITAL_COMMUNITY)
Admission: RE | Admit: 2019-06-08 | Discharge: 2019-06-08 | Disposition: A | Discharge status: Home / Self Care | Payer: PPO | Source: Ambulatory Visit | Attending: Internal Medicine | Admitting: Internal Medicine

## 2019-06-08 DIAGNOSIS — R636 Underweight: Secondary | ICD-10-CM | POA: Diagnosis not present

## 2019-06-08 DIAGNOSIS — R634 Abnormal weight loss: Secondary | ICD-10-CM | POA: Diagnosis not present

## 2019-06-08 DIAGNOSIS — R1013 Epigastric pain: Secondary | ICD-10-CM

## 2019-06-08 DIAGNOSIS — R1110 Vomiting, unspecified: Secondary | ICD-10-CM | POA: Diagnosis not present

## 2019-06-08 DIAGNOSIS — R110 Nausea: Secondary | ICD-10-CM | POA: Diagnosis not present

## 2019-06-08 DIAGNOSIS — Z681 Body mass index (BMI) 19 or less, adult: Secondary | ICD-10-CM | POA: Diagnosis not present

## 2019-06-08 DIAGNOSIS — E440 Moderate protein-calorie malnutrition: Secondary | ICD-10-CM | POA: Diagnosis not present

## 2019-06-08 LAB — POCT I-STAT CREATININE: Creatinine, Ser: 0.7 mg/dL (ref 0.44–1.00)

## 2019-06-08 MED ORDER — IOHEXOL 300 MG/ML  SOLN
100.0000 mL | Freq: Once | INTRAMUSCULAR | Status: AC | PRN
  Administered 2019-06-08: 80 mL via INTRAVENOUS

## 2019-06-10 DIAGNOSIS — E039 Hypothyroidism, unspecified: Secondary | ICD-10-CM | POA: Diagnosis not present

## 2019-06-10 DIAGNOSIS — K2270 Barrett's esophagus without dysplasia: Secondary | ICD-10-CM | POA: Diagnosis not present

## 2019-06-10 DIAGNOSIS — E785 Hyperlipidemia, unspecified: Secondary | ICD-10-CM | POA: Diagnosis not present

## 2019-06-10 DIAGNOSIS — K219 Gastro-esophageal reflux disease without esophagitis: Secondary | ICD-10-CM | POA: Diagnosis not present

## 2019-06-16 ENCOUNTER — Encounter (HOSPITAL_COMMUNITY): Payer: Self-pay

## 2019-06-16 ENCOUNTER — Other Ambulatory Visit: Payer: Self-pay

## 2019-06-17 ENCOUNTER — Encounter (HOSPITAL_COMMUNITY)
Admission: RE | Admit: 2019-06-17 | Discharge: 2019-06-17 | Disposition: A | Discharge status: Home / Self Care | Payer: PPO | Source: Ambulatory Visit | Attending: Ophthalmology | Admitting: Ophthalmology

## 2019-06-17 DIAGNOSIS — H25812 Combined forms of age-related cataract, left eye: Secondary | ICD-10-CM | POA: Diagnosis not present

## 2019-06-17 NOTE — H&P (Signed)
Surgical History & Physical  Patient Name: Terry Allen DOB: 01/20/1941  Surgery: Cataract extraction with intraocular lens implant phacoemulsification; Left Eye  Surgeon: Baruch Goldmann MD Surgery Date:  06/21/2019 Pre-Op Date:  06/17/2019  HPI: A 40 Yr. old female patient 1. 1. The patient complains of difficulty when viewing TV, reading closed caption, news scrolls on TV, which began 1 year ago. The left eye is affected. The episode is gradual. The condition's severity increased since last visit. Symptoms occur when the patient is inside and outside. This is negatively affecting the patient's quality of life. 2. The patient is returning after cataract post-op. The right eye is affected. Status post cataract post-op, which began 1 week ago: Since the last visit, the affected area is doing well. The patient's vision is improved at distance, states blurry vision upclose yesterday. Patient is following medication instructions. HPI was performed by Baruch Goldmann .  Medical History: Cataracts Hypertensive Retinopathy LDL Thyroid Problems bladder sling Acid reflux  Review of Systems Negative Allergic/Immunologic Negative Cardiovascular Negative Constitutional Negative Ear, Nose, Mouth & Throat Negative Endocrine Negative Eyes Negative Gastrointestinal Negative Genitourinary Negative Hemotologic/Lymphatic Negative Integumentary Negative Musculoskeletal Negative Neurological Negative Psychiatry Negative Respiratory  Social   Never smoked   Medication Omni,  Levothyroxine Sodium, Atorvastatin, Trimethoprim, Gabapentin, Omeprazole, Calcium, Oxybutynin Chloride,   Sx/Procedures Phaco c IOL,  Bladder Sling,   Drug Allergies   NKDA  History & Physical: Heent:  Cataract, Left eye NECK: supple without bruits LUNGS: lungs clear to auscultation CV: regular rate and rhythm Abdomen: soft and non-tender  Impression & Plan: Assessment: 1.  COMBINED FORMS AGE RELATED  CATARACT; Left Eye (H25.812) 2.  CATARACT EXTRACTION STATUS; Right Eye (Z98.41)  Plan: 1.  Cataract accounts for the patient's decreased vision. This visual impairment is not correctable with a tolerable change in glasses or contact lenses. Cataract surgery with an implantation of a new lens should significantly improve the visual and functional status of the patient. Discussed all risks, benefits, alternatives, and potential complications. Discussed the procedures and recovery. Patient desires to have surgery. A-scan ordered and performed today for intra-ocular lens calculations. The surgery will be performed in order to improve vision for driving, reading, and for eye examinations. Recommend phacoemulsification with intra-ocular lens. Left Eye. Surgery required to correct imbalance of vision. Dilates well - shugarcaine by protocol. 2.  1 week after cataract surgery. Doing well with improved vision and normal eye pressure. Call with any problems or concerns. Continue Gati-Brom-Pred 2x/day for 3 more weeks.

## 2019-06-18 ENCOUNTER — Other Ambulatory Visit (HOSPITAL_COMMUNITY)
Admission: RE | Admit: 2019-06-18 | Discharge: 2019-06-18 | Disposition: A | Discharge status: Home / Self Care | Payer: PPO | Source: Ambulatory Visit | Attending: Ophthalmology | Admitting: Ophthalmology

## 2019-06-18 ENCOUNTER — Other Ambulatory Visit: Payer: Self-pay

## 2019-06-18 DIAGNOSIS — Z20828 Contact with and (suspected) exposure to other viral communicable diseases: Secondary | ICD-10-CM | POA: Diagnosis not present

## 2019-06-18 DIAGNOSIS — Z01812 Encounter for preprocedural laboratory examination: Secondary | ICD-10-CM | POA: Insufficient documentation

## 2019-06-18 LAB — SARS CORONAVIRUS 2 (TAT 6-24 HRS): SARS Coronavirus 2: NEGATIVE

## 2019-06-21 ENCOUNTER — Encounter (HOSPITAL_COMMUNITY)
Admission: RE | Disposition: A | Discharge status: Home / Self Care | Payer: Self-pay | Source: Home / Self Care | Attending: Ophthalmology

## 2019-06-21 ENCOUNTER — Ambulatory Visit (HOSPITAL_COMMUNITY): Payer: PPO | Admitting: Anesthesiology

## 2019-06-21 ENCOUNTER — Ambulatory Visit (HOSPITAL_COMMUNITY)
Admission: RE | Admit: 2019-06-21 | Discharge: 2019-06-21 | Disposition: A | Discharge status: Home / Self Care | Payer: PPO | Attending: Ophthalmology | Admitting: Ophthalmology

## 2019-06-21 DIAGNOSIS — K219 Gastro-esophageal reflux disease without esophagitis: Secondary | ICD-10-CM | POA: Insufficient documentation

## 2019-06-21 DIAGNOSIS — Z79899 Other long term (current) drug therapy: Secondary | ICD-10-CM | POA: Insufficient documentation

## 2019-06-21 DIAGNOSIS — Z79890 Hormone replacement therapy (postmenopausal): Secondary | ICD-10-CM | POA: Diagnosis not present

## 2019-06-21 DIAGNOSIS — E039 Hypothyroidism, unspecified: Secondary | ICD-10-CM | POA: Diagnosis not present

## 2019-06-21 DIAGNOSIS — H25812 Combined forms of age-related cataract, left eye: Secondary | ICD-10-CM | POA: Diagnosis not present

## 2019-06-21 DIAGNOSIS — H2512 Age-related nuclear cataract, left eye: Secondary | ICD-10-CM | POA: Diagnosis not present

## 2019-06-21 DIAGNOSIS — Z9841 Cataract extraction status, right eye: Secondary | ICD-10-CM | POA: Insufficient documentation

## 2019-06-21 HISTORY — PX: CATARACT EXTRACTION W/PHACO: SHX586

## 2019-06-21 SURGERY — PHACOEMULSIFICATION, CATARACT, WITH IOL INSERTION
Anesthesia: Monitor Anesthesia Care | Site: Eye | Laterality: Left

## 2019-06-21 MED ORDER — PROVISC 10 MG/ML IO SOLN
INTRAOCULAR | Status: DC | PRN
  Administered 2019-06-21: 0.85 mL via INTRAOCULAR

## 2019-06-21 MED ORDER — TETRACAINE HCL 0.5 % OP SOLN
1.0000 [drp] | OPHTHALMIC | Status: AC | PRN
  Administered 2019-06-21 (×3): 1 [drp] via OPHTHALMIC

## 2019-06-21 MED ORDER — NEOMYCIN-POLYMYXIN-DEXAMETH 3.5-10000-0.1 OP SUSP
OPHTHALMIC | Status: DC | PRN
  Administered 2019-06-21: 2 [drp] via OPHTHALMIC

## 2019-06-21 MED ORDER — LIDOCAINE HCL 3.5 % OP GEL
1.0000 "application " | Freq: Once | OPHTHALMIC | Status: AC
  Administered 2019-06-21: 1 via OPHTHALMIC

## 2019-06-21 MED ORDER — PHENYLEPHRINE HCL 2.5 % OP SOLN
1.0000 [drp] | OPHTHALMIC | Status: AC | PRN
  Administered 2019-06-21 (×3): 1 [drp] via OPHTHALMIC

## 2019-06-21 MED ORDER — POVIDONE-IODINE 5 % OP SOLN
OPHTHALMIC | Status: DC | PRN
  Administered 2019-06-21: 1 via OPHTHALMIC

## 2019-06-21 MED ORDER — EPINEPHRINE PF 1 MG/ML IJ SOLN
INTRAOCULAR | Status: DC | PRN
  Administered 2019-06-21: 500 mL

## 2019-06-21 MED ORDER — LIDOCAINE HCL (PF) 1 % IJ SOLN
INTRAOCULAR | Status: DC | PRN
  Administered 2019-06-21: 1 mL via OPHTHALMIC

## 2019-06-21 MED ORDER — BSS IO SOLN
INTRAOCULAR | Status: DC | PRN
  Administered 2019-06-21: 15 mL via INTRAOCULAR

## 2019-06-21 MED ORDER — CYCLOPENTOLATE-PHENYLEPHRINE 0.2-1 % OP SOLN
1.0000 [drp] | OPHTHALMIC | Status: AC | PRN
  Administered 2019-06-21 (×3): 1 [drp] via OPHTHALMIC

## 2019-06-21 MED ORDER — SODIUM HYALURONATE 23 MG/ML IO SOLN
INTRAOCULAR | Status: DC | PRN
  Administered 2019-06-21: 0.6 mL via INTRAOCULAR

## 2019-06-21 SURGICAL SUPPLY — 13 items

## 2019-06-21 NOTE — Anesthesia Postprocedure Evaluation (Signed)
Anesthesia Post Note  Patient: KALYA TROEGER  Procedure(s) Performed: CATARACT EXTRACTION PHACO AND INTRAOCULAR LENS PLACEMENT ([place]) (Left Eye)  Patient location during evaluation: Short Stay Anesthesia Type: MAC Level of consciousness: awake and alert and oriented Pain management: pain level controlled Vital Signs Assessment: post-procedure vital signs reviewed and stable Respiratory status: spontaneous breathing Cardiovascular status: blood pressure returned to baseline and stable Postop Assessment: no apparent nausea or vomiting Anesthetic complications: no     Last Vitals:  Vitals:   06/21/19 1315  BP: 131/63  Pulse: 77  Resp: 20  Temp: 36.7 C  SpO2: 100%    Last Pain:  Vitals:   06/21/19 1315  PainSc: 0-No pain                 Mykhia Danish

## 2019-06-21 NOTE — Interval H&P Note (Signed)
History and Physical Interval Note: The H and P was reviewed and updated. The patient was examined.  No changes were found after exam.  The surgical eye was marked.  06/21/2019 1:34 PM  Terry Allen  has presented today for surgery, with the diagnosis of Nuclear sclerotic cataract - Left eye.  The various methods of treatment have been discussed with the patient and family. After consideration of risks, benefits and other options for treatment, the patient has consented to  Procedure(s) with comments: CATARACT EXTRACTION PHACO AND INTRAOCULAR LENS PLACEMENT ([place]) (Left) - left as a surgical intervention.  The patient's history has been reviewed, patient examined, no change in status, stable for surgery.  I have reviewed the patient's chart and labs.  Questions were answered to the patient's satisfaction.     Baruch Goldmann

## 2019-06-21 NOTE — Anesthesia Preprocedure Evaluation (Signed)
Anesthesia Evaluation  Patient identified by MRN, date of birth, ID band Patient awake    Reviewed: Allergy & Precautions, NPO status , Patient's Chart, lab work & pertinent test results  Airway Mallampati: III  TM Distance: >3 FB Neck ROM: Full    Dental no notable dental hx. (+) Teeth Intact   Pulmonary neg pulmonary ROS,    Pulmonary exam normal breath sounds clear to auscultation       Cardiovascular Exercise Tolerance: Good negative cardio ROS Normal cardiovascular examI Rhythm:Regular Rate:Normal     Neuro/Psych negative neurological ROS  negative psych ROS   GI/Hepatic Neg liver ROS, GERD  Medicated and Controlled,  Endo/Other  Hypothyroidism   Renal/GU negative Renal ROS  negative genitourinary   Musculoskeletal negative musculoskeletal ROS (+)   Abdominal   Peds negative pediatric ROS (+)  Hematology negative hematology ROS (+)   Anesthesia Other Findings   Reproductive/Obstetrics negative OB ROS                             Anesthesia Physical Anesthesia Plan  ASA: II  Anesthesia Plan: MAC   Post-op Pain Management:    Induction: Intravenous  PONV Risk Score and Plan: 2 and TIVA, Ondansetron and Treatment may vary due to age or medical condition  Airway Management Planned: Nasal Cannula and Simple Face Mask  Additional Equipment:   Intra-op Plan:   Post-operative Plan:   Informed Consent: I have reviewed the patients History and Physical, chart, labs and discussed the procedure including the risks, benefits and alternatives for the proposed anesthesia with the patient or authorized representative who has indicated his/her understanding and acceptance.     Dental advisory given  Plan Discussed with: CRNA  Anesthesia Plan Comments: (Plan Full PPE use  Plan MAC as previous - WTP with same    Second IOL)        Anesthesia Quick Evaluation

## 2019-06-21 NOTE — Discharge Instructions (Signed)
Please discharge patient when stable, will follow up today with Dr. Shevaun Lovan at the [facility] office immediately following discharge.  Leave shield in place until visit.  All paperwork with discharge instructions will be given at the office. ° °

## 2019-06-21 NOTE — Transfer of Care (Signed)
Immediate Anesthesia Transfer of Care Note  Patient: Terry Allen  Procedure(s) Performed: CATARACT EXTRACTION PHACO AND INTRAOCULAR LENS PLACEMENT (IOC) (Left Eye)  Patient Location: Short Stay  Anesthesia Type:MAC  Level of Consciousness: awake  Airway & Oxygen Therapy: Patient Spontanous Breathing  Post-op Assessment: Report given to RN  Post vital signs: Reviewed  Last Vitals:  Vitals Value Taken Time  BP    Temp    Pulse    Resp    SpO2      Last Pain:  Vitals:   06/21/19 1315  PainSc: 0-No pain         Complications: No apparent anesthesia complications

## 2019-06-21 NOTE — Op Note (Signed)
Date of procedure: 06/21/19  Pre-operative diagnosis: Visually significant age-related combined cataract, Left Eye (H25.812)  Post-operative diagnosis: Visually significant age-related combined cataract, Left Eye (H25.812)  Procedure: Removal of cataract via phacoemulsification and insertion of intra-ocular lens [employer] PCB00  +21.5D into the capsular bag of the Left Eye  Attending surgeon: Gerda Diss. Macguire Holsinger, MD, MA  Anesthesia: MAC, Topical Akten  Complications: None  Estimated Blood Loss: <80m (minimal)  Specimens: None  Implants: As above  Indications:  Visually significant age-related cataract, Left Eye  Procedure:  The patient was seen and identified in the pre-operative area. The operative eye was identified and dilated.  The operative eye was marked.  Topical anesthesia was administered to the operative eye.     The patient was then to the operative suite and placed in the supine position.  A timeout was performed confirming the patient, procedure to be performed, and all other relevant information.   The patient's face was prepped and draped in the usual fashion for intra-ocular surgery.  A lid speculum was placed into the operative eye and the surgical microscope moved into place and focused.  An inferotemporal paracentesis was created using a 20 gauge paracentesis blade.  Shugarcaine was injected into the anterior chamber.  Viscoelastic was injected into the anterior chamber.  A temporal clear-corneal main wound incision was created using a 2.422mmicrokeratome.  A continuous curvilinear capsulorrhexis was initiated using an irrigating cystitome and completed using capsulorrhexis forceps.  Hydrodissection and hydrodeliniation were performed.  Viscoelastic was injected into the anterior chamber.  A phacoemulsification handpiece and a chopper as a second instrument were used to remove the nucleus and epinucleus. The irrigation/aspiration handpiece was used to remove  any remaining cortical material.   The capsular bag was reinflated with viscoelastic, checked, and found to be intact.  The intraocular lens was inserted into the capsular bag.  The irrigation/aspiration handpiece was used to remove any remaining viscoelastic.  The clear corneal wound and paracentesis wounds were then hydrated and checked with Weck-Cels to be watertight.  The lid-speculum and drape was removed, and the patient's face was cleaned with a wet and dry 4x4.  Maxitrol was instilled in the eye before a clear shield was taped over the eye. The patient was taken to the post-operative care unit in good condition, having tolerated the procedure well.  Post-Op Instructions: [facility] and will receive all other orders and instructions.

## 2019-06-22 ENCOUNTER — Encounter (HOSPITAL_COMMUNITY): Payer: Self-pay | Admitting: Ophthalmology

## 2019-07-15 ENCOUNTER — Ambulatory Visit (INDEPENDENT_AMBULATORY_CARE_PROVIDER_SITE_OTHER): Payer: PPO | Admitting: Nurse Practitioner

## 2019-07-15 DIAGNOSIS — E782 Mixed hyperlipidemia: Secondary | ICD-10-CM | POA: Diagnosis not present

## 2019-07-15 DIAGNOSIS — K2270 Barrett's esophagus without dysplasia: Secondary | ICD-10-CM | POA: Diagnosis not present

## 2019-07-15 DIAGNOSIS — K219 Gastro-esophageal reflux disease without esophagitis: Secondary | ICD-10-CM | POA: Diagnosis not present

## 2019-07-15 DIAGNOSIS — E039 Hypothyroidism, unspecified: Secondary | ICD-10-CM | POA: Diagnosis not present

## 2019-07-15 DIAGNOSIS — R110 Nausea: Secondary | ICD-10-CM | POA: Diagnosis not present

## 2019-07-15 DIAGNOSIS — R102 Pelvic and perineal pain: Secondary | ICD-10-CM | POA: Diagnosis not present

## 2019-08-09 DIAGNOSIS — E039 Hypothyroidism, unspecified: Secondary | ICD-10-CM | POA: Diagnosis not present

## 2019-08-09 DIAGNOSIS — E782 Mixed hyperlipidemia: Secondary | ICD-10-CM | POA: Diagnosis not present

## 2019-08-11 DIAGNOSIS — Z79899 Other long term (current) drug therapy: Secondary | ICD-10-CM | POA: Diagnosis not present

## 2019-08-11 DIAGNOSIS — K2270 Barrett's esophagus without dysplasia: Secondary | ICD-10-CM | POA: Diagnosis not present

## 2019-08-11 DIAGNOSIS — K219 Gastro-esophageal reflux disease without esophagitis: Secondary | ICD-10-CM | POA: Diagnosis not present

## 2019-08-11 DIAGNOSIS — E782 Mixed hyperlipidemia: Secondary | ICD-10-CM | POA: Diagnosis not present

## 2019-08-11 DIAGNOSIS — R102 Pelvic and perineal pain: Secondary | ICD-10-CM | POA: Diagnosis not present

## 2019-08-11 DIAGNOSIS — E039 Hypothyroidism, unspecified: Secondary | ICD-10-CM | POA: Diagnosis not present

## 2019-09-15 ENCOUNTER — Other Ambulatory Visit: Payer: Self-pay

## 2019-09-15 ENCOUNTER — Ambulatory Visit (INDEPENDENT_AMBULATORY_CARE_PROVIDER_SITE_OTHER): Payer: PPO | Admitting: Gastroenterology

## 2019-09-15 ENCOUNTER — Telehealth (INDEPENDENT_AMBULATORY_CARE_PROVIDER_SITE_OTHER): Payer: Self-pay | Admitting: *Deleted

## 2019-09-15 ENCOUNTER — Encounter (INDEPENDENT_AMBULATORY_CARE_PROVIDER_SITE_OTHER): Payer: Self-pay | Admitting: *Deleted

## 2019-09-15 ENCOUNTER — Encounter (INDEPENDENT_AMBULATORY_CARE_PROVIDER_SITE_OTHER): Payer: Self-pay | Admitting: Gastroenterology

## 2019-09-15 VITALS — BP 120/73 | HR 71 | Temp 97.3°F | Ht 62.0 in | Wt 100.8 lb

## 2019-09-15 DIAGNOSIS — Z1211 Encounter for screening for malignant neoplasm of colon: Secondary | ICD-10-CM | POA: Insufficient documentation

## 2019-09-15 MED ORDER — FAMOTIDINE 20 MG PO TABS: 20.0000 mg | ORAL_TABLET | Freq: Two times a day (BID) | ORAL | 3 refills | Status: DC

## 2019-09-15 NOTE — Telephone Encounter (Signed)
Patient needs suprep TCS sch'd 2/24

## 2019-09-15 NOTE — Progress Notes (Signed)
Patient profile: Terry Allen is a 79 y.o. female seen for evaluation of chest/eg pain . Last seen in clinic 02/2019 for evaluation of dysphagia.   History of Present Illness: TENEA SENS is seen today for lower chest upper epigastric pain.  She reports the pain has been ongoing for 4 years but is worse over the past year.  Feels that sometimes it goes through to her back.  It can be associated with nausea at times. She is on Prilosec 40 mg twice a day.  She has found as needed Pepcid beneficial at times but has not taken this regularly.  She had dysphagia prior to her endoscopy about 7 months ago but this has resolved. Feels the current chest pain she is having is unchanged compared to symptoms she had at time of EGD June 2020.   She denies any constipation, diarrhea, lower abd pain with BM, or rectal bleeding.  Usually has a bowel movement daily.    Recently had thyroid dose adjusted. She takes Norco twice a day.  She is also on gabapentin and amitriptyline for lower abdominal pelvic pain following a complicated bladder surgery.  Wt Readings from Last 3 Encounters:  09/15/19 100 lb 12.8 oz (45.7 kg)  06/21/19 97 lb (44 kg)  06/06/19 101 lb (45.8 kg)     Last Colonoscopy: No colonoscopy on file-flex sig 2013, Dr. Esperanza Heir to 60 cm.  Internal hemorrhoids   Last Endoscopy: EGD June 2020-web in upper third of esophagus, dilation 27 [place], EGD otherwise normal.   Past Medical History:  Past Medical History:  Diagnosis Date  . Acid reflux    Patient reports Barretts  . Bulging lumbar disc   . High cholesterol   . Hypothyroidism   . Nerve damage   . Osteopenia   . Pelvic pain in female    due to bladder sling    Problem List: Patient Active Problem List   Diagnosis Date Noted  . Colon cancer screening 09/15/2019  . Esophageal dysphagia 05/20/2018  . GERD (gastroesophageal reflux disease) 12/13/2014  . Barrett's esophagus 12/13/2014  . Hypothyroidism  12/13/2014  . High cholesterol 12/13/2014    Past Surgical History: Past Surgical History:  Procedure Laterality Date  . APPENDECTOMY  1960  . BLADDER SUSPENSION    . CATARACT EXTRACTION W/PHACO Right 06/07/2019   Procedure: CATARACT EXTRACTION PHACO AND INTRAOCULAR LENS PLACEMENT (IOC);  Surgeon: Baruch Goldmann, MD;  Location: [facility];  Service: Ophthalmology;  Laterality: Right;  CDE: 10.87  . CATARACT EXTRACTION W/PHACO Left 06/21/2019   Procedure: CATARACT EXTRACTION PHACO AND INTRAOCULAR LENS PLACEMENT (IOC);  Surgeon: Baruch Goldmann, MD;  Location: [facility];  Service: Ophthalmology;  Laterality: Left;  CDE: 11.59  . ESOPHAGEAL DILATION N/A 05/27/2018   Procedure: ESOPHAGEAL DILATION;  Surgeon: Rogene Houston, MD;  [facility];  Service: Endoscopy;  Laterality: N/A;  . ESOPHAGEAL DILATION N/A 02/12/2019   Procedure: ESOPHAGEAL DILATION;  Surgeon: Rogene Houston, MD;  [facility];  Service: Endoscopy;  Laterality: N/A;  . ESOPHAGOGASTRODUODENOSCOPY N/A 12/21/2014   Procedure: ESOPHAGOGASTRODUODENOSCOPY (EGD);  Surgeon: Rogene Houston, MD;  [facility];  Service: Endoscopy;  Laterality: N/A;  210  . ESOPHAGOGASTRODUODENOSCOPY N/A 06/16/2015   Procedure: ESOPHAGOGASTRODUODENOSCOPY (EGD);  Surgeon: Rogene Houston, MD;  [facility];  Service: Endoscopy;  Laterality: N/A;  1250  . ESOPHAGOGASTRODUODENOSCOPY N/A 05/27/2018   Procedure: ESOPHAGOGASTRODUODENOSCOPY (EGD);  Surgeon: Rogene Houston, MD;  [facility];  Service: Endoscopy;  Laterality: N/A;  7:30  . ESOPHAGOGASTRODUODENOSCOPY N/A 02/12/2019   Procedure: ESOPHAGOGASTRODUODENOSCOPY (EGD);  Surgeon: Rogene Houston, MD;  [facility];  Service: Endoscopy;  Laterality: N/A;    Allergies: No Known Allergies    Home Medications:  Current Outpatient Medications:  .  acetaminophen (TYLENOL) 500 MG tablet, Take 1,000 mg by mouth every 6 (six) hours as needed for  moderate pain. , Disp: , Rfl:  .  amitriptyline (ELAVIL) 50 MG tablet, Take 25 mg by mouth at bedtime. , Disp: , Rfl: 2 .  atorvastatin (LIPITOR) 40 MG tablet, Take 40 mg by mouth at bedtime. , Disp: , Rfl:  .  Calcium Carb-Cholecalciferol (CALCIUM 600 + D PO), Take 1 tablet by mouth 2 (two) times a day., Disp: , Rfl:  .  [place] Oil-Levomenthol (FDGARD PO), Take by mouth daily., Disp: , Rfl:  .  gabapentin (NEURONTIN) 300 MG capsule, Take 2 capsules (600 mg total) by mouth 3 (three) times daily. (Patient taking differently: Take 300 mg by mouth 3 (three) times daily after meals. ), Disp: 540 capsule, Rfl: 4 .  HYDROcodone-acetaminophen (NORCO/VICODIN) 5-325 MG tablet, Take 1 tablet by mouth every 6 (six) hours as needed., Disp: , Rfl:  .  levothyroxine (SYNTHROID, LEVOTHROID) 50 MCG tablet, Take 75 mcg by mouth daily before breakfast. , Disp: , Rfl: 4 .  omeprazole (PRILOSEC) 40 MG capsule, Take 40 mg by mouth 2 (two) times a day., Disp: , Rfl:  .  ondansetron (ZOFRAN) 4 MG tablet, Take 4 mg by mouth every 8 (eight) hours as needed. for nausea, Disp: , Rfl:  .  famotidine (PEPCID) 20 MG tablet, Take 1 tablet (20 mg total) by mouth 2 (two) times daily., Disp: 60 tablet, Rfl: 3   Family History: family history includes Breast cancer in her mother; Cancer in her brother, father, and mother; Diabetes in her mother and son; Healthy in her brother; Heart disease in her mother; Hypertension in her mother; Kidney cancer in her brother; Lung cancer in her father; Stroke in her father and mother.    Social History:   reports that she has never smoked. She has never used smokeless tobacco. She reports that she does not drink alcohol or use drugs.   Review of Systems: Constitutional: Denies weight loss/weight gain  Eyes: No changes in vision. ENT: No oral lesions, sore throat.  GI: see HPI.  Heme/Lymph: No easy bruising.  CV: No chest pain.  GU: No hematuria.  Integumentary: No rashes.  Neuro: No  headaches.  Psych: No depression/anxiety.  Endocrine: No heat/cold intolerance.  Allergic/Immunologic: No urticaria.  Resp: No cough, SOB.  Musculoskeletal: No joint swelling.    Physical Examination: BP 120/73 (BP Location: Right Arm, Patient Position: Sitting, Cuff Size: Small)   Pulse 71   Temp (!) 97.3 F (36.3 C) (Temporal)   Ht 5\' 2"  (1.575 m)   Wt 100 lb 12.8 oz (45.7 kg)   BMI 18.44 kg/m  Gen: NAD, alert and oriented x 4 HEENT: PEERLA, EOMI, Neck: supple, no JVD Chest: CTA bilaterally, no wheezes, crackles, or other adventitious sounds CV: RRR, no m/g/c/r Abd: soft, NT, ND, +BS in all four quadrants; no HSM, guarding, ridigity, or rebound tenderness Ext: no edema, well perfused with 2+ pulses, Skin: no rash or lesions noted on observed skin Lymph: no noted LAD  Data Reviewed:  CT a/p - 05/2019-IMPRESSION: 1. No acute abdominopelvic findings. 2. Normal gallbladder.  Barium swallow - 02/2019- IMPRESSION: Previously  identified high-grade stricture of the proximal cervical esophagus by an anterior esophageal web is no longer identified. No esophageal mass or stricture; 12.5 mm diameter barium tablet passes to the stomach now without obstruction. Aspiration of contrast seen on the previous exam was not redemonstrated on current study.   Assessment/Plan: Ms. Schaafsma is a 79 y.o. female   1.  Lower chest/upper epigastric pain-endoscopy 7 months ago when she was having same pain was fairly unremarkable.  She is on PPI twice daily. CT also unremarkable. HIDA EF  In 2018 was 55%. She does note sometimes this begins when she is working in the yard active, did recommend she discuss with her PCP as she states she has not had an EKG recently.  Does not seem to correlate specifically postprandially.  She is using Pepcid as needed and will have her start this routinely to see if helps with pain.  She just had labs her PCP at our request a copy of these to ensure LFTs etc.  normal.  2.  Colon cancer screening-had incomplete flex sig 7 years ago, no complete colonoscopy on file.  She is interested in screening colonoscopy and will arrange this.  Patient denies CP, SOB, and use of blood thinners. I discussed the risks and benefits of procedure including bleeding, perforation, infection, missed lesions, medication reactions and possible hospitalization or surgery if complications. All questions answered. Denies prior issues w/ sedation.   Enjoli was seen today for follow-up.  Diagnoses and all orders for this visit:  Colon cancer screening -     Procedural/ Surgical Case Request: COLONOSCOPY; Standing -     Procedural/ Surgical Case Request: COLONOSCOPY  Other orders -     famotidine (PEPCID) 20 MG tablet; Take 1 tablet (20 mg total) by mouth 2 (two) times daily.    Will f/up with her in office after colonoscopy to assess response to adding routine pepcid.    I personally performed the service, non-incident to. (WP)  Laurine Blazer, [facility]

## 2019-09-15 NOTE — Patient Instructions (Addendum)
We will set up your colonoscopy.  I would like for you to see your primary care and discuss having an EKG given pain is located in chest.  You can continue the omeprazole 40 mg twice a day.  I sent a prescription for Pepcid to add, continue avoiding spicy foods, greasy foods, etc.   I am requesting labs from your primary care.

## 2019-09-17 MED ORDER — SUPREP BOWEL PREP KIT 17.5-3.13-1.6 GM/177ML PO SOLN: 1.0000 | Freq: Once | ORAL | 0 refills | Status: AC

## 2019-09-17 NOTE — Telephone Encounter (Signed)
Rx sent to pharmacy   

## 2019-10-04 DIAGNOSIS — E039 Hypothyroidism, unspecified: Secondary | ICD-10-CM | POA: Diagnosis not present

## 2019-10-04 DIAGNOSIS — E782 Mixed hyperlipidemia: Secondary | ICD-10-CM | POA: Diagnosis not present

## 2019-10-06 ENCOUNTER — Other Ambulatory Visit: Payer: Self-pay

## 2019-10-06 ENCOUNTER — Ambulatory Visit (HOSPITAL_COMMUNITY)
Admission: RE | Admit: 2019-10-06 | Discharge: 2019-10-06 | Disposition: A | Discharge status: Home / Self Care | Payer: PPO | Source: Ambulatory Visit | Attending: Family Medicine | Admitting: Family Medicine

## 2019-10-06 ENCOUNTER — Other Ambulatory Visit (HOSPITAL_COMMUNITY): Payer: Self-pay | Admitting: Family Medicine

## 2019-10-06 DIAGNOSIS — K219 Gastro-esophageal reflux disease without esophagitis: Secondary | ICD-10-CM | POA: Diagnosis not present

## 2019-10-06 DIAGNOSIS — E039 Hypothyroidism, unspecified: Secondary | ICD-10-CM | POA: Diagnosis not present

## 2019-10-06 DIAGNOSIS — Z136 Encounter for screening for cardiovascular disorders: Secondary | ICD-10-CM | POA: Diagnosis not present

## 2019-10-06 DIAGNOSIS — Z79899 Other long term (current) drug therapy: Secondary | ICD-10-CM | POA: Diagnosis not present

## 2019-10-06 DIAGNOSIS — E782 Mixed hyperlipidemia: Secondary | ICD-10-CM | POA: Diagnosis not present

## 2019-10-06 DIAGNOSIS — R0789 Other chest pain: Secondary | ICD-10-CM

## 2019-10-06 DIAGNOSIS — K2270 Barrett's esophagus without dysplasia: Secondary | ICD-10-CM | POA: Diagnosis not present

## 2019-10-06 DIAGNOSIS — R079 Chest pain, unspecified: Secondary | ICD-10-CM | POA: Diagnosis not present

## 2019-10-07 ENCOUNTER — Other Ambulatory Visit (INDEPENDENT_AMBULATORY_CARE_PROVIDER_SITE_OTHER): Payer: Self-pay | Admitting: *Deleted

## 2019-10-07 DIAGNOSIS — Z1211 Encounter for screening for malignant neoplasm of colon: Secondary | ICD-10-CM

## 2019-10-19 NOTE — Telephone Encounter (Signed)
This encounter was created in error - please disregard.

## 2019-11-01 ENCOUNTER — Other Ambulatory Visit: Payer: Self-pay

## 2019-11-01 ENCOUNTER — Other Ambulatory Visit (HOSPITAL_COMMUNITY): Payer: PPO

## 2019-11-01 ENCOUNTER — Other Ambulatory Visit (HOSPITAL_COMMUNITY)
Admission: RE | Admit: 2019-11-01 | Discharge: 2019-11-01 | Disposition: A | Discharge status: Home / Self Care | Payer: PPO | Source: Ambulatory Visit | Attending: Internal Medicine | Admitting: Internal Medicine

## 2019-11-01 DIAGNOSIS — Z01812 Encounter for preprocedural laboratory examination: Secondary | ICD-10-CM | POA: Diagnosis not present

## 2019-11-01 DIAGNOSIS — Z20822 Contact with and (suspected) exposure to covid-19: Secondary | ICD-10-CM | POA: Insufficient documentation

## 2019-11-01 LAB — SARS CORONAVIRUS 2 (TAT 6-24 HRS): SARS Coronavirus 2: NEGATIVE

## 2019-11-03 ENCOUNTER — Encounter (HOSPITAL_COMMUNITY): Payer: Self-pay | Admitting: Anesthesiology

## 2019-11-03 ENCOUNTER — Ambulatory Visit (HOSPITAL_COMMUNITY)
Admission: RE | Admit: 2019-11-03 | Discharge: 2019-11-03 | Disposition: A | Discharge status: Home / Self Care | Payer: PPO | Attending: Internal Medicine | Admitting: Internal Medicine

## 2019-11-03 ENCOUNTER — Other Ambulatory Visit: Payer: Self-pay

## 2019-11-03 ENCOUNTER — Encounter (HOSPITAL_COMMUNITY)
Admission: RE | Disposition: A | Discharge status: Home / Self Care | Payer: Self-pay | Source: Home / Self Care | Attending: Internal Medicine

## 2019-11-03 ENCOUNTER — Encounter (HOSPITAL_COMMUNITY): Payer: Self-pay | Admitting: Internal Medicine

## 2019-11-03 ENCOUNTER — Ambulatory Visit (HOSPITAL_COMMUNITY): Admit: 2019-11-03 | Payer: PPO | Admitting: Internal Medicine

## 2019-11-03 ENCOUNTER — Encounter (HOSPITAL_COMMUNITY): Payer: Self-pay

## 2019-11-03 DIAGNOSIS — M8580 Other specified disorders of bone density and structure, unspecified site: Secondary | ICD-10-CM | POA: Diagnosis not present

## 2019-11-03 DIAGNOSIS — E039 Hypothyroidism, unspecified: Secondary | ICD-10-CM | POA: Diagnosis not present

## 2019-11-03 DIAGNOSIS — D120 Benign neoplasm of cecum: Secondary | ICD-10-CM | POA: Insufficient documentation

## 2019-11-03 DIAGNOSIS — E7800 Pure hypercholesterolemia, unspecified: Secondary | ICD-10-CM | POA: Insufficient documentation

## 2019-11-03 DIAGNOSIS — Z79899 Other long term (current) drug therapy: Secondary | ICD-10-CM | POA: Insufficient documentation

## 2019-11-03 DIAGNOSIS — K219 Gastro-esophageal reflux disease without esophagitis: Secondary | ICD-10-CM | POA: Diagnosis not present

## 2019-11-03 DIAGNOSIS — K5730 Diverticulosis of large intestine without perforation or abscess without bleeding: Secondary | ICD-10-CM | POA: Insufficient documentation

## 2019-11-03 DIAGNOSIS — Z79890 Hormone replacement therapy (postmenopausal): Secondary | ICD-10-CM | POA: Diagnosis not present

## 2019-11-03 DIAGNOSIS — Z1211 Encounter for screening for malignant neoplasm of colon: Secondary | ICD-10-CM

## 2019-11-03 HISTORY — PX: COLONOSCOPY: SHX5424

## 2019-11-03 HISTORY — PX: BIOPSY: SHX5522

## 2019-11-03 SURGERY — COLONOSCOPY
Anesthesia: Moderate Sedation

## 2019-11-03 MED ORDER — MEPERIDINE HCL 50 MG/ML IJ SOLN
INTRAMUSCULAR | Status: AC
  Filled 2019-11-03: qty 1

## 2019-11-03 MED ORDER — MIDAZOLAM HCL 5 MG/5ML IJ SOLN
INTRAMUSCULAR | Status: AC
  Filled 2019-11-03: qty 10

## 2019-11-03 MED ORDER — MEPERIDINE HCL 50 MG/ML IJ SOLN
INTRAMUSCULAR | Status: DC | PRN
  Administered 2019-11-03 (×2): 15 mg via INTRAVENOUS

## 2019-11-03 MED ORDER — STERILE WATER FOR IRRIGATION IR SOLN
Status: DC | PRN
  Administered 2019-11-03: 1.5 mL

## 2019-11-03 MED ORDER — SODIUM CHLORIDE 0.9 % IV SOLN
INTRAVENOUS | Status: DC
  Administered 2019-11-03: 1000 mL via INTRAVENOUS

## 2019-11-03 MED ORDER — MIDAZOLAM HCL 5 MG/5ML IJ SOLN
INTRAMUSCULAR | Status: DC | PRN
  Administered 2019-11-03: 1 mg via INTRAVENOUS
  Administered 2019-11-03 (×2): 2 mg via INTRAVENOUS

## 2019-11-03 NOTE — H&P (Signed)
Terry Allen is an 79 y.o. female.   Chief Complaint: Patient is here for colonoscopy. HPI: Patient is 79 year old Caucasian female who is here for screening colonoscopy.  While she did have a sigmoidoscopy were 7 years ago in [place] her last colonoscopy was over 20 years ago.  She denies diarrhea constipation or rectal bleeding.  Last year she did do some weight because of nausea and chest pain and she says the symptoms have resolved.  She has history of GERD complicated by short segment Barrett's esophagus.  She had her esophagus dilated by me in 2019.  She is able to swallow without any difficulty. Family history is negative for CRC.  Past Medical History:  Diagnosis Date  . Acid reflux    Patient reports Barretts  . Bulging lumbar disc   . High cholesterol   . Hypothyroidism   . Nerve damage   . Osteopenia   . Pelvic pain in female    due to bladder sling    Past Surgical History:  Procedure Laterality Date  . APPENDECTOMY  1960  . BLADDER SUSPENSION    . CATARACT EXTRACTION W/PHACO Right 06/07/2019   Procedure: CATARACT EXTRACTION PHACO AND INTRAOCULAR LENS PLACEMENT (IOC);  Surgeon: Baruch Goldmann, MD;  Location: [facility];  Service: Ophthalmology;  Laterality: Right;  CDE: 10.87  . CATARACT EXTRACTION W/PHACO Left 06/21/2019   Procedure: CATARACT EXTRACTION PHACO AND INTRAOCULAR LENS PLACEMENT (IOC);  Surgeon: Baruch Goldmann, MD;  Location: [facility];  Service: Ophthalmology;  Laterality: Left;  CDE: 11.59  . ESOPHAGEAL DILATION N/A 05/27/2018   Procedure: ESOPHAGEAL DILATION;  Surgeon: Rogene Houston, MD;  [facility];  Service: Endoscopy;  Laterality: N/A;  . ESOPHAGEAL DILATION N/A 02/12/2019   Procedure: ESOPHAGEAL DILATION;  Surgeon: Rogene Houston, MD;  [facility];  Service: Endoscopy;  Laterality: N/A;  . ESOPHAGOGASTRODUODENOSCOPY N/A 12/21/2014   Procedure: ESOPHAGOGASTRODUODENOSCOPY (EGD);  Surgeon: Rogene Houston, MD;   [facility];  Service: Endoscopy;  Laterality: N/A;  210  . ESOPHAGOGASTRODUODENOSCOPY N/A 06/16/2015   Procedure: ESOPHAGOGASTRODUODENOSCOPY (EGD);  Surgeon: Rogene Houston, MD;  [facility];  Service: Endoscopy;  Laterality: N/A;  1250  . ESOPHAGOGASTRODUODENOSCOPY N/A 05/27/2018   Procedure: ESOPHAGOGASTRODUODENOSCOPY (EGD);  Surgeon: Rogene Houston, MD;  [facility];  Service: Endoscopy;  Laterality: N/A;  7:30  . ESOPHAGOGASTRODUODENOSCOPY N/A 02/12/2019   Procedure: ESOPHAGOGASTRODUODENOSCOPY (EGD);  Surgeon: Rogene Houston, MD;  [facility];  Service: Endoscopy;  Laterality: N/A;    Family History  Problem Relation Age of Onset  . Breast cancer Mother   . Heart disease Mother   . Diabetes Mother   . Cancer Mother        breast  . Stroke Mother   . Hypertension Mother   . Lung cancer Father   . Stroke Father   . Cancer Father        oat cell cancer/lung  . Kidney cancer Brother   . Cancer Brother        renal cell cancer  . Healthy Brother   . Diabetes Son    Social History:  reports that she has never smoked. She has never used smokeless tobacco. She reports that she does not drink alcohol or use drugs.  Allergies: No Known Allergies  Medications Prior to Admission  Medication Sig Dispense Refill  . acetaminophen (TYLENOL) 500 MG tablet Take 500 mg by mouth every 6 (six)  hours as needed for moderate pain.     Marland Kitchen amitriptyline (ELAVIL) 50 MG tablet Take 50 mg by mouth at bedtime.   2  . atorvastatin (LIPITOR) 40 MG tablet Take 40 mg by mouth at bedtime.     . Calcium Carb-Cholecalciferol (CALCIUM 600 + D PO) Take 1 tablet by mouth 2 (two) times a day.    Gladis Riffle Oil-Levomenthol (FDGARD PO) Take 1 tablet by mouth daily as needed (acid reflux).     . famotidine (PEPCID) 20 MG tablet Take 1 tablet (20 mg total) by mouth 2 (two) times daily. 60 tablet 3  . gabapentin (NEURONTIN) 300 MG capsule Take 2 capsules (600 mg total)  by mouth 3 (three) times daily. (Patient taking differently: Take 300 mg by mouth 3 (three) times daily. ) 540 capsule 4  . levothyroxine (SYNTHROID) 75 MCG tablet Take 37.5-75 mcg by mouth See admin instructions. Take 37.5 mg on Mon, Wed, and Fri, Take 75 mcg on Sun, Tue, Thur, and Sat    . ondansetron (ZOFRAN) 4 MG tablet Take 4 mg by mouth every 8 (eight) hours as needed for nausea or vomiting.       No results found for this or any previous visit (from the past 48 hour(s)). No results found.  Review of Systems  Blood pressure 111/63, pulse 91, temperature 98.2 F (36.8 C), temperature source Oral, resp. rate 15, height 5\' 2"  (1.575 m), weight 45.4 kg, SpO2 99 %. Physical Exam  Constitutional:  Well-developed thin Caucasian female in NAD.  HENT:  Mouth/Throat: Oropharynx is clear and moist.  Eyes: Conjunctivae are normal. No scleral icterus.  Neck: No thyromegaly present.  Cardiovascular: Normal rate, regular rhythm and normal heart sounds.  No murmur heard. Respiratory: Effort normal and breath sounds normal.  GI: Soft. She exhibits no distension and no mass. There is no abdominal tenderness.  Lymphadenopathy:    She has no cervical adenopathy.  Neurological: She is alert.  Skin: Skin is warm and dry.     Assessment/Plan  Average risk screening colonoscopy.  Hildred Laser, MD 11/03/2019, 12:43 PM

## 2019-11-03 NOTE — Op Note (Signed)
[facility] Patient Name: Terry Allen Procedure Date: 11/03/2019 12:11 PM MRN: [id number] Date of Birth: 1941-01-13 Attending MD: Hildred Laser , MD CSN: [id number] Age: 79 Admit Type: Outpatient Procedure:                Colonoscopy Indications:              Screening for colorectal malignant neoplasm Providers:                Hildred Laser, MD, Otis Peak B. Sharon Seller, RN, Raphael Gibney, Technician Referring MD:             Arlyss Repress, MD Medicines:                Meperidine 30 mg IV, Midazolam 5 mg IV Complications:            No immediate complications. Estimated Blood Loss:     Estimated blood loss was minimal. Procedure:                Pre-Anesthesia Assessment:                           - Prior to the procedure, a History and Physical                            was performed, and patient medications and                            allergies were reviewed. The patient's tolerance of                            previous anesthesia was also reviewed. The risks                            and benefits of the procedure and the sedation                            options and risks were discussed with the patient.                            All questions were answered, and informed consent                            was obtained. Prior Anticoagulants: The patient has                            taken no previous anticoagulant or antiplatelet                            agents. ASA Grade Assessment: II - A patient with                            mild systemic disease. After reviewing the risks  and benefits, the patient was deemed in                            satisfactory condition to undergo the procedure.                           After obtaining informed consent, the colonoscope                            was passed under direct vision. Throughout the                            procedure, the patient's blood pressure, pulse,  and                            oxygen saturations were monitored continuously. The                            [id number]-[id number] ([id number]) scope was introduced through                            the anus and advanced to the the cecum, identified                            by appendiceal orifice and ileocecal valve. The                            colonoscopy was somewhat difficult due to                            restricted mobility of the colon. Successful                            completion of the procedure was aided by                            withdrawing the scope and replacing with the                            'babyscope'. The patient tolerated the procedure                            well. The quality of the bowel preparation was                            adequate. The ileocecal valve, appendiceal orifice,                            and rectum were photographed. Scope In: 12:55:23 PM Scope Out: 1:21:45 PM Scope Withdrawal Time: 0 hours 11 minutes 21 seconds  Total Procedure Duration: 0 hours 26 minutes 22 seconds  Findings:      The perianal and digital rectal examinations were normal.      A small polyp was found in the cecum. The polyp  was sessile. Biopsies       were taken with a cold forceps for histology.      Scattered medium-mouthed diverticula were found in the sigmoid colon.      The retroflexed view of the distal rectum and anal verge was normal and       showed no anal or rectal abnormalities. Impression:               - One small polyp in the cecum. Biopsied.                           - Diverticulosis in the sigmoid colon. Moderate Sedation:      Moderate (conscious) sedation was administered by the endoscopy nurse       and supervised by the endoscopist. The following parameters were       monitored: oxygen saturation, heart rate, blood pressure, CO2       capnography and response to care. Total physician intraservice time was       30 minutes. Recommendation:            - Patient has a contact number available for                            emergencies. The signs and symptoms of potential                            delayed complications were discussed with the                            patient. Return to normal activities tomorrow.                            Written discharge instructions were provided to the                            patient.                           - High fiber diet today.                           - Continue present medications.                           - No aspirin, ibuprofen, naproxen, or other                            non-steroidal anti-inflammatory drugs for 1 day.                           - Await pathology results.                           - No recommendation at this time regarding repeat                            colonoscopy. Procedure Code(s):        --- Professional ---  40981, Colonoscopy, flexible; with biopsy, single                            or multiple                           99153, Moderate sedation; each additional 15                            minutes intraservice time                           G0500, Moderate sedation services provided by the                            same physician or other qualified health care                            professional performing a gastrointestinal                            endoscopic service that sedation supports,                            requiring the presence of an independent trained                            observer to assist in the monitoring of the                            patient's level of consciousness and physiological                            status; initial 15 minutes of intra-service time;                            patient age 57 years or older (additional time may                            be reported with [phone], as appropriate) Diagnosis Code(s):        --- Professional ---                           Z12.11,  Encounter for screening for malignant                            neoplasm of colon                           K63.5, Polyp of colon                           K57.30, Diverticulosis of large intestine without                            perforation or abscess without  bleeding CPT copyright 2019 [employer]. All rights reserved. The codes documented in this report are preliminary and upon coder review may  be revised to meet current compliance requirements. Hildred Laser, MD Hildred Laser, MD 11/03/2019 1:30:10 PM This report has been signed electronically. Number of Addenda: 0

## 2019-11-03 NOTE — Discharge Instructions (Signed)
Diverticulosis  Diverticulosis is a condition that develops when small pouches (diverticula) form in the wall of the large intestine (colon). The colon is where water is absorbed and stool (feces) is formed. The pouches form when the inside layer of the colon pushes through weak spots in the outer layers of the colon. You may have a few pouches or many of them. The pouches usually do not cause problems unless they become inflamed or infected. When this happens, the condition is called diverticulitis. What are the causes? The cause of this condition is not known. What increases the risk? The following factors may make you more likely to develop this condition:  Being older than age 18. Your risk for this condition increases with age. Diverticulosis is rare among people younger than age 47. By age 73, many people have it.  Eating a low-fiber diet.  Having frequent constipation.  Being overweight.  Not getting enough exercise.  Smoking.  Taking over-the-counter pain medicines, like aspirin and ibuprofen.  Having a family history of diverticulosis. What are the signs or symptoms? In most people, there are no symptoms of this condition. If you do have symptoms, they may include:  Bloating.  Cramps in the abdomen.  Constipation or diarrhea.  Pain in the lower left side of the abdomen. How is this diagnosed? Because diverticulosis usually has no symptoms, it is most often diagnosed during an exam for other colon problems. The condition may be diagnosed by:  Using a flexible scope to examine the colon (colonoscopy).  Taking an X-ray of the colon after dye has been put into the colon (barium enema).  Having a CT scan. How is this treated? You may not need treatment for this condition. Your health care provider may recommend treatment to prevent problems. You may need treatment if you have symptoms or if you previously had diverticulitis. Treatment may include:  Eating a high-fiber  diet.  Taking a fiber supplement.  Taking a live bacteria supplement (probiotic).  Taking medicine to relax your colon. Follow these instructions at home: Medicines  Take over-the-counter and prescription medicines only as told by your health care provider.  If told by your health care provider, take a fiber supplement or probiotic. Constipation prevention Your condition may cause constipation. To prevent or treat constipation, you may need to:  Drink enough fluid to keep your urine pale yellow.  Take over-the-counter or prescription medicines.  Eat foods that are high in fiber, such as beans, whole grains, and fresh fruits and vegetables.  Limit foods that are high in fat and processed sugars, such as fried or sweet foods.  General instructions  Try not to strain when you have a bowel movement.  Keep all follow-up visits as told by your health care provider. This is important. Contact a health care provider if you:  Have pain in your abdomen.  Have bloating.  Have cramps.  Have not had a bowel movement in 3 days. Get help right away if:  Your pain gets worse.  Your bloating becomes very bad.  You have a fever or chills, and your symptoms suddenly get worse.  You vomit.  You have bowel movements that are bloody or black.  You have bleeding from your rectum. Summary  Diverticulosis is a condition that develops when small pouches (diverticula) form in the wall of the large intestine (colon).  You may have a few pouches or many of them.  This condition is most often diagnosed during an exam for other colon  problems.  Treatment may include increasing the fiber in your diet, taking supplements, or taking medicines. This information is not intended to replace advice given to you by your health care provider. Make sure you discuss any questions you have with your health care provider. Document Revised: 03/25/2019 Document Reviewed: 03/25/2019 Elsevier Patient  Education  [place]. Colonoscopy, Adult, Care After This sheet gives you information about how to care for yourself after your procedure. Your health care provider may also give you more specific instructions. If you have problems or questions, contact your health care provider. What can I expect after the procedure? After the procedure, it is common to have:  A small amount of blood in your stool for 24 hours after the procedure.  Some gas.  Mild cramping or bloating of your abdomen. Follow these instructions at home: Eating and drinking   Drink enough fluid to keep your urine pale yellow.  Follow instructions from your health care provider about eating or drinking restrictions.  Resume your normal diet as instructed by your health care provider. Avoid heavy or fried foods that are hard to digest. Activity  Rest as told by your health care provider.  Avoid sitting for a long time without moving. Get up to take short walks every 1-2 hours. This is important to improve blood flow and breathing. Ask for help if you feel weak or unsteady.  Return to your normal activities as told by your health care provider. Ask your health care provider what activities are safe for you. Managing cramping and bloating   Try walking around when you have cramps or feel bloated.  Apply heat to your abdomen as told by your health care provider. Use the heat source that your health care provider recommends, such as a moist heat pack or a heating pad. ? Place a towel between your skin and the heat source. ? Leave the heat on for 20-30 minutes. ? Remove the heat if your skin turns bright red. This is especially important if you are unable to feel pain, heat, or cold. You may have a greater risk of getting burned. General instructions  For the first 24 hours after the procedure: ? Do not drive or use machinery. ? Do not sign important documents. ? Do not drink alcohol. ? Do your regular  daily activities at a slower pace than normal. ? Eat soft foods that are easy to digest.  Take over-the-counter and prescription medicines only as told by your health care provider.  Keep all follow-up visits as told by your health care provider. This is important. Contact a health care provider if:  You have blood in your stool 2-3 days after the procedure. Get help right away if you have:  More than a small spotting of blood in your stool.  Large blood clots in your stool.  Swelling of your abdomen.  Nausea or vomiting.  A fever.  Increasing pain in your abdomen that is not relieved with medicine. Summary  After the procedure, it is common to have a small amount of blood in your stool. You may also have mild cramping and bloating of your abdomen.  For the first 24 hours after the procedure, do not drive or use machinery, sign important documents, or drink alcohol.  Get help right away if you have a lot of blood in your stool, nausea or vomiting, a fever, or increased pain in your abdomen. This information is not intended to replace advice given to you by  your health care provider. Make sure you discuss any questions you have with your health care provider. Document Revised: 03/22/2019 Document Reviewed: 03/22/2019 Elsevier Patient Education  [place]. Colon Polyps  Polyps are tissue growths inside the body. Polyps can grow in many places, including the large intestine (colon). A polyp may be a round bump or a mushroom-shaped growth. You could have one polyp or several. Most colon polyps are noncancerous (benign). However, some colon polyps can become cancerous over time. Finding and removing the polyps early can help prevent this. What are the causes? The exact cause of colon polyps is not known. What increases the risk? You are more likely to develop this condition if you:  Have a family history of colon cancer or colon polyps.  Are older than 30 or older than  45 if you are African American.  Have inflammatory bowel disease, such as ulcerative colitis or Crohn's disease.  Have certain hereditary conditions, such as: ? Familial adenomatous polyposis. ? Lynch syndrome. ? Turcot syndrome. ? Peutz-Jeghers syndrome.  Are overweight.  Smoke cigarettes.  Do not get enough exercise.  Drink too much alcohol.  Eat a diet that is high in fat and red meat and low in fiber.  Had childhood cancer that was treated with abdominal radiation. What are the signs or symptoms? Most polyps do not cause symptoms. If you have symptoms, they may include:  Blood coming from your rectum when having a bowel movement.  Blood in your stool. The stool may look dark red or black.  Abdominal pain.  A change in bowel habits, such as constipation or diarrhea. How is this diagnosed? This condition is diagnosed with a colonoscopy. This is a procedure in which a lighted, flexible scope is inserted into the anus and then passed into the colon to examine the area. Polyps are sometimes found when a colonoscopy is done as part of routine cancer screening tests. How is this treated? Treatment for this condition involves removing any polyps that are found. Most polyps can be removed during a colonoscopy. Those polyps will then be tested for cancer. Additional treatment may be needed depending on the results of testing. Follow these instructions at home: Lifestyle  Maintain a healthy weight, or lose weight if recommended by your health care provider.  Exercise every day or as told by your health care provider.  Do not use any products that contain nicotine or tobacco, such as cigarettes and e-cigarettes. If you need help quitting, ask your health care provider.  If you drink alcohol, limit how much you have: ? 0-1 drink a day for women. ? 0-2 drinks a day for men.  Be aware of how much alcohol is in your drink. In the [place] one drink equals one 12 oz bottle of beer  (355 mL), one 5 oz glass of wine (148 mL), or one 1 oz shot of hard liquor (44 mL). Eating and drinking   Eat foods that are high in fiber, such as fruits, vegetables, and whole grains.  Eat foods that are high in calcium and vitamin D, such as milk, cheese, yogurt, eggs, liver, fish, and broccoli.  Limit foods that are high in fat, such as fried foods and desserts.  Limit the amount of red meat and processed meat you eat, such as hot dogs, sausage, bacon, and lunch meats. General instructions  Keep all follow-up visits as told by your health care provider. This is important. ? This includes having regularly scheduled colonoscopies. ?  Talk to your health care provider about when you need a colonoscopy. Contact a health care provider if:  You have new or worsening bleeding during a bowel movement.  You have new or increased blood in your stool.  You have a change in bowel habits.  You lose weight for no known reason. Summary  Polyps are tissue growths inside the body. Polyps can grow in many places, including the colon.  Most colon polyps are noncancerous (benign), but some can become cancerous over time.  This condition is diagnosed with a colonoscopy.  Treatment for this condition involves removing any polyps that are found. Most polyps can be removed during a colonoscopy. This information is not intended to replace advice given to you by your health care provider. Make sure you discuss any questions you have with your health care provider. Document Revised: 12/11/2017 Document Reviewed: 12/11/2017 Elsevier Patient Education  [place]. No aspirin or NSAIDs for 24 hours. Resume usual medications as before. High-fiber diet. No driving for 24 hours. Physician will call with biopsy results.

## 2019-11-04 DIAGNOSIS — R300 Dysuria: Secondary | ICD-10-CM | POA: Diagnosis not present

## 2019-11-04 DIAGNOSIS — N390 Urinary tract infection, site not specified: Secondary | ICD-10-CM | POA: Diagnosis not present

## 2019-11-05 LAB — SURGICAL PATHOLOGY

## 2019-12-06 ENCOUNTER — Ambulatory Visit: Payer: PPO

## 2019-12-06 ENCOUNTER — Other Ambulatory Visit: Payer: Self-pay

## 2019-12-09 ENCOUNTER — Ambulatory Visit: Payer: PPO | Attending: Internal Medicine

## 2019-12-09 DIAGNOSIS — Z23 Encounter for immunization: Secondary | ICD-10-CM

## 2019-12-09 NOTE — Progress Notes (Signed)
   Covid-19 Vaccination Clinic  Name:  Terry Allen    MRN: [id number] DOB: 1940-11-23  12/09/2019  Ms. Horn was observed post Covid-19 immunization for 15 minutes without incident. She was provided with Vaccine Information Sheet and instruction to access the V-Safe system.   [name] was instructed to call 911 with any severe reactions post vaccine: Marland Kitchen Difficulty breathing  . Swelling of face and throat  . A fast heartbeat  . A bad rash all over body  . Dizziness and weakness   Immunizations Administered    Name Date Dose VIS Date Route   Moderna COVID-19 Vaccine 12/09/2019  9:15 AM 0.5 mL 08/10/2019 Intramuscular   Manufacturer: Moderna   Lot: [id number]   [place]: 25189-842-10

## 2019-12-15 DIAGNOSIS — R110 Nausea: Secondary | ICD-10-CM | POA: Diagnosis not present

## 2019-12-15 DIAGNOSIS — M791 Myalgia, unspecified site: Secondary | ICD-10-CM | POA: Diagnosis not present

## 2019-12-15 DIAGNOSIS — T50905A Adverse effect of unspecified drugs, medicaments and biological substances, initial encounter: Secondary | ICD-10-CM | POA: Diagnosis not present

## 2019-12-15 DIAGNOSIS — R630 Anorexia: Secondary | ICD-10-CM | POA: Diagnosis not present

## 2020-01-11 ENCOUNTER — Ambulatory Visit: Payer: PPO | Attending: Internal Medicine

## 2020-01-11 DIAGNOSIS — Z23 Encounter for immunization: Secondary | ICD-10-CM

## 2020-01-11 NOTE — Progress Notes (Signed)
   Covid-19 Vaccination Clinic  Name:  Terry Allen    MRN: [id number] DOB: 06-02-1941  01/11/2020  Terry Allen was observed post Covid-19 immunization for 15 minutes without incident. She was provided with Vaccine Information Sheet and instruction to access the V-Safe system.   Terry Allen was instructed to call 911 with any severe reactions post vaccine: Marland Kitchen Difficulty breathing  . Swelling of face and throat  . A fast heartbeat  . A bad rash all over body  . Dizziness and weakness   Immunizations Administered    Name Date Dose VIS Date Route   Moderna COVID-19 Vaccine 01/11/2020  8:32 AM 0.5 mL 08/2019 Intramuscular   Manufacturer: Moderna   Lot: [id number]   [place]: [id number]

## 2020-01-28 DIAGNOSIS — W57XXXA Bitten or stung by nonvenomous insect and other nonvenomous arthropods, initial encounter: Secondary | ICD-10-CM | POA: Diagnosis not present

## 2020-01-28 DIAGNOSIS — E782 Mixed hyperlipidemia: Secondary | ICD-10-CM | POA: Diagnosis not present

## 2020-01-28 DIAGNOSIS — Z681 Body mass index (BMI) 19 or less, adult: Secondary | ICD-10-CM | POA: Diagnosis not present

## 2020-01-28 DIAGNOSIS — E039 Hypothyroidism, unspecified: Secondary | ICD-10-CM | POA: Diagnosis not present

## 2020-01-28 DIAGNOSIS — K219 Gastro-esophageal reflux disease without esophagitis: Secondary | ICD-10-CM | POA: Diagnosis not present

## 2020-01-28 DIAGNOSIS — G629 Polyneuropathy, unspecified: Secondary | ICD-10-CM | POA: Diagnosis not present

## 2020-01-28 DIAGNOSIS — Z7689 Persons encountering health services in other specified circumstances: Secondary | ICD-10-CM | POA: Diagnosis not present

## 2020-02-10 DIAGNOSIS — N390 Urinary tract infection, site not specified: Secondary | ICD-10-CM | POA: Diagnosis not present

## 2020-02-10 DIAGNOSIS — Z681 Body mass index (BMI) 19 or less, adult: Secondary | ICD-10-CM | POA: Diagnosis not present

## 2020-03-23 DIAGNOSIS — E039 Hypothyroidism, unspecified: Secondary | ICD-10-CM | POA: Diagnosis not present

## 2020-04-10 DIAGNOSIS — R300 Dysuria: Secondary | ICD-10-CM | POA: Diagnosis not present

## 2020-05-01 DIAGNOSIS — E039 Hypothyroidism, unspecified: Secondary | ICD-10-CM | POA: Diagnosis not present

## 2020-05-03 DIAGNOSIS — M25562 Pain in left knee: Secondary | ICD-10-CM | POA: Diagnosis not present

## 2020-05-03 DIAGNOSIS — M25561 Pain in right knee: Secondary | ICD-10-CM | POA: Diagnosis not present

## 2020-05-03 DIAGNOSIS — G8929 Other chronic pain: Secondary | ICD-10-CM | POA: Diagnosis not present

## 2020-05-03 DIAGNOSIS — Z681 Body mass index (BMI) 19 or less, adult: Secondary | ICD-10-CM | POA: Diagnosis not present

## 2020-05-11 DIAGNOSIS — M25561 Pain in right knee: Secondary | ICD-10-CM | POA: Diagnosis not present

## 2020-05-11 DIAGNOSIS — Z6820 Body mass index (BMI) 20.0-20.9, adult: Secondary | ICD-10-CM | POA: Diagnosis not present

## 2020-05-11 DIAGNOSIS — K219 Gastro-esophageal reflux disease without esophagitis: Secondary | ICD-10-CM | POA: Diagnosis not present

## 2020-05-16 DIAGNOSIS — G8929 Other chronic pain: Secondary | ICD-10-CM | POA: Diagnosis not present

## 2020-05-16 DIAGNOSIS — M25561 Pain in right knee: Secondary | ICD-10-CM | POA: Diagnosis not present

## 2020-05-16 DIAGNOSIS — R296 Repeated falls: Secondary | ICD-10-CM | POA: Diagnosis not present

## 2020-06-02 DIAGNOSIS — Z6820 Body mass index (BMI) 20.0-20.9, adult: Secondary | ICD-10-CM | POA: Diagnosis not present

## 2020-06-02 DIAGNOSIS — R300 Dysuria: Secondary | ICD-10-CM | POA: Diagnosis not present

## 2020-06-10 DIAGNOSIS — R300 Dysuria: Secondary | ICD-10-CM | POA: Diagnosis not present

## 2020-06-10 DIAGNOSIS — Z23 Encounter for immunization: Secondary | ICD-10-CM | POA: Diagnosis not present

## 2020-06-10 DIAGNOSIS — R5383 Other fatigue: Secondary | ICD-10-CM | POA: Diagnosis not present

## 2020-06-15 DIAGNOSIS — Z1331 Encounter for screening for depression: Secondary | ICD-10-CM | POA: Diagnosis not present

## 2020-06-15 DIAGNOSIS — K219 Gastro-esophageal reflux disease without esophagitis: Secondary | ICD-10-CM | POA: Diagnosis not present

## 2020-06-15 DIAGNOSIS — Z681 Body mass index (BMI) 19 or less, adult: Secondary | ICD-10-CM | POA: Diagnosis not present

## 2020-06-15 DIAGNOSIS — M25561 Pain in right knee: Secondary | ICD-10-CM | POA: Diagnosis not present

## 2020-06-15 DIAGNOSIS — Z1389 Encounter for screening for other disorder: Secondary | ICD-10-CM | POA: Diagnosis not present

## 2020-06-22 DIAGNOSIS — R296 Repeated falls: Secondary | ICD-10-CM | POA: Diagnosis not present

## 2020-06-22 DIAGNOSIS — G8929 Other chronic pain: Secondary | ICD-10-CM | POA: Diagnosis not present

## 2020-06-22 DIAGNOSIS — M25562 Pain in left knee: Secondary | ICD-10-CM | POA: Diagnosis not present

## 2020-06-23 DIAGNOSIS — M25562 Pain in left knee: Secondary | ICD-10-CM | POA: Diagnosis not present

## 2020-06-23 DIAGNOSIS — N3000 Acute cystitis without hematuria: Secondary | ICD-10-CM | POA: Diagnosis not present

## 2020-06-23 DIAGNOSIS — Z6820 Body mass index (BMI) 20.0-20.9, adult: Secondary | ICD-10-CM | POA: Diagnosis not present

## 2020-06-29 DIAGNOSIS — N3941 Urge incontinence: Secondary | ICD-10-CM | POA: Diagnosis not present

## 2020-06-29 DIAGNOSIS — N3020 Other chronic cystitis without hematuria: Secondary | ICD-10-CM | POA: Diagnosis not present

## 2020-07-31 DIAGNOSIS — Z0001 Encounter for general adult medical examination with abnormal findings: Secondary | ICD-10-CM | POA: Diagnosis not present

## 2020-07-31 DIAGNOSIS — R159 Full incontinence of feces: Secondary | ICD-10-CM | POA: Diagnosis not present

## 2020-07-31 DIAGNOSIS — M25561 Pain in right knee: Secondary | ICD-10-CM | POA: Diagnosis not present

## 2020-07-31 DIAGNOSIS — R8271 Bacteriuria: Secondary | ICD-10-CM | POA: Diagnosis not present

## 2020-07-31 DIAGNOSIS — R32 Unspecified urinary incontinence: Secondary | ICD-10-CM | POA: Diagnosis not present

## 2020-07-31 DIAGNOSIS — R350 Frequency of micturition: Secondary | ICD-10-CM | POA: Diagnosis not present

## 2020-07-31 DIAGNOSIS — K219 Gastro-esophageal reflux disease without esophagitis: Secondary | ICD-10-CM | POA: Diagnosis not present

## 2020-07-31 DIAGNOSIS — R030 Elevated blood-pressure reading, without diagnosis of hypertension: Secondary | ICD-10-CM | POA: Diagnosis not present

## 2020-07-31 DIAGNOSIS — Z7189 Other specified counseling: Secondary | ICD-10-CM | POA: Diagnosis not present

## 2020-08-10 DIAGNOSIS — R300 Dysuria: Secondary | ICD-10-CM | POA: Diagnosis not present

## 2020-08-10 DIAGNOSIS — N3941 Urge incontinence: Secondary | ICD-10-CM | POA: Diagnosis not present

## 2020-08-21 DIAGNOSIS — Z23 Encounter for immunization: Secondary | ICD-10-CM | POA: Diagnosis not present

## 2020-08-23 ENCOUNTER — Other Ambulatory Visit (HOSPITAL_COMMUNITY): Payer: Self-pay | Admitting: Family Medicine

## 2020-08-23 DIAGNOSIS — N644 Mastodynia: Secondary | ICD-10-CM

## 2020-08-24 ENCOUNTER — Other Ambulatory Visit: Payer: Self-pay

## 2020-08-24 ENCOUNTER — Other Ambulatory Visit: Payer: PPO

## 2020-08-24 DIAGNOSIS — Z20822 Contact with and (suspected) exposure to covid-19: Secondary | ICD-10-CM | POA: Diagnosis not present

## 2020-08-26 LAB — SARS-COV-2, NAA 2 DAY TAT

## 2020-08-26 LAB — NOVEL CORONAVIRUS, NAA: SARS-CoV-2, NAA: NOT DETECTED

## 2020-08-26 LAB — SPECIMEN STATUS REPORT

## 2020-09-05 DIAGNOSIS — N3000 Acute cystitis without hematuria: Secondary | ICD-10-CM | POA: Diagnosis not present

## 2020-09-19 ENCOUNTER — Ambulatory Visit (HOSPITAL_COMMUNITY)
Admission: RE | Admit: 2020-09-19 | Discharge: 2020-09-19 | Disposition: A | Discharge status: Home / Self Care | Payer: PPO | Source: Ambulatory Visit | Attending: Family Medicine | Admitting: Family Medicine

## 2020-09-19 ENCOUNTER — Other Ambulatory Visit: Payer: Self-pay

## 2020-09-19 DIAGNOSIS — N644 Mastodynia: Secondary | ICD-10-CM | POA: Insufficient documentation

## 2020-09-19 DIAGNOSIS — R922 Inconclusive mammogram: Secondary | ICD-10-CM | POA: Diagnosis not present

## 2020-10-06 DIAGNOSIS — R8271 Bacteriuria: Secondary | ICD-10-CM | POA: Diagnosis not present

## 2020-10-06 DIAGNOSIS — N3001 Acute cystitis with hematuria: Secondary | ICD-10-CM | POA: Diagnosis not present

## 2020-10-06 DIAGNOSIS — R3129 Other microscopic hematuria: Secondary | ICD-10-CM | POA: Diagnosis not present

## 2020-10-18 DIAGNOSIS — N3020 Other chronic cystitis without hematuria: Secondary | ICD-10-CM | POA: Diagnosis not present

## 2020-11-24 DIAGNOSIS — R300 Dysuria: Secondary | ICD-10-CM | POA: Diagnosis not present

## 2020-11-24 DIAGNOSIS — R159 Full incontinence of feces: Secondary | ICD-10-CM | POA: Diagnosis not present

## 2020-11-24 DIAGNOSIS — Z6821 Body mass index (BMI) 21.0-21.9, adult: Secondary | ICD-10-CM | POA: Diagnosis not present

## 2020-11-24 DIAGNOSIS — R350 Frequency of micturition: Secondary | ICD-10-CM | POA: Diagnosis not present

## 2020-11-24 DIAGNOSIS — R32 Unspecified urinary incontinence: Secondary | ICD-10-CM | POA: Diagnosis not present

## 2020-12-11 DIAGNOSIS — R058 Other specified cough: Secondary | ICD-10-CM | POA: Diagnosis not present

## 2020-12-11 DIAGNOSIS — J309 Allergic rhinitis, unspecified: Secondary | ICD-10-CM | POA: Diagnosis not present

## 2020-12-11 DIAGNOSIS — J040 Acute laryngitis: Secondary | ICD-10-CM | POA: Diagnosis not present

## 2021-02-13 DIAGNOSIS — R1013 Epigastric pain: Secondary | ICD-10-CM | POA: Diagnosis not present

## 2021-02-13 DIAGNOSIS — E039 Hypothyroidism, unspecified: Secondary | ICD-10-CM | POA: Diagnosis not present

## 2021-02-13 DIAGNOSIS — K219 Gastro-esophageal reflux disease without esophagitis: Secondary | ICD-10-CM | POA: Diagnosis not present

## 2021-02-13 DIAGNOSIS — Z7689 Persons encountering health services in other specified circumstances: Secondary | ICD-10-CM | POA: Diagnosis not present

## 2021-02-13 DIAGNOSIS — Z87440 Personal history of urinary (tract) infections: Secondary | ICD-10-CM | POA: Diagnosis not present

## 2021-02-13 DIAGNOSIS — R110 Nausea: Secondary | ICD-10-CM | POA: Diagnosis not present

## 2021-03-19 DIAGNOSIS — F32A Depression, unspecified: Secondary | ICD-10-CM | POA: Diagnosis not present

## 2021-03-19 DIAGNOSIS — R300 Dysuria: Secondary | ICD-10-CM | POA: Diagnosis not present

## 2021-03-19 DIAGNOSIS — Z681 Body mass index (BMI) 19 or less, adult: Secondary | ICD-10-CM | POA: Diagnosis not present

## 2021-04-03 DIAGNOSIS — E039 Hypothyroidism, unspecified: Secondary | ICD-10-CM | POA: Diagnosis not present

## 2021-04-09 DIAGNOSIS — R110 Nausea: Secondary | ICD-10-CM | POA: Diagnosis not present

## 2021-04-09 DIAGNOSIS — S00562A Insect bite (nonvenomous) of oral cavity, initial encounter: Secondary | ICD-10-CM | POA: Diagnosis not present

## 2021-04-09 DIAGNOSIS — W57XXXA Bitten or stung by nonvenomous insect and other nonvenomous arthropods, initial encounter: Secondary | ICD-10-CM | POA: Diagnosis not present

## 2021-04-09 DIAGNOSIS — R300 Dysuria: Secondary | ICD-10-CM | POA: Diagnosis not present

## 2021-04-12 DIAGNOSIS — R21 Rash and other nonspecific skin eruption: Secondary | ICD-10-CM | POA: Diagnosis not present

## 2021-04-12 DIAGNOSIS — T148XXA Other injury of unspecified body region, initial encounter: Secondary | ICD-10-CM | POA: Diagnosis not present

## 2021-04-19 DIAGNOSIS — K219 Gastro-esophageal reflux disease without esophagitis: Secondary | ICD-10-CM | POA: Diagnosis not present

## 2021-04-19 DIAGNOSIS — R21 Rash and other nonspecific skin eruption: Secondary | ICD-10-CM | POA: Diagnosis not present

## 2021-04-19 DIAGNOSIS — R110 Nausea: Secondary | ICD-10-CM | POA: Diagnosis not present

## 2021-05-04 DIAGNOSIS — Z20810 Contact with and (suspected) exposure to anthrax: Secondary | ICD-10-CM | POA: Diagnosis not present

## 2021-05-04 DIAGNOSIS — K219 Gastro-esophageal reflux disease without esophagitis: Secondary | ICD-10-CM | POA: Diagnosis not present

## 2021-05-04 DIAGNOSIS — R110 Nausea: Secondary | ICD-10-CM | POA: Diagnosis not present

## 2021-05-04 DIAGNOSIS — R059 Cough, unspecified: Secondary | ICD-10-CM | POA: Diagnosis not present

## 2021-05-04 DIAGNOSIS — R1013 Epigastric pain: Secondary | ICD-10-CM | POA: Diagnosis not present

## 2021-05-04 DIAGNOSIS — G8929 Other chronic pain: Secondary | ICD-10-CM | POA: Diagnosis not present

## 2021-05-21 ENCOUNTER — Ambulatory Visit
Admission: EM | Admit: 2021-05-21 | Discharge: 2021-05-21 | Disposition: A | Discharge status: Home / Self Care | Payer: PPO | Attending: Family | Admitting: Family

## 2021-05-21 ENCOUNTER — Other Ambulatory Visit: Payer: Self-pay

## 2021-05-21 DIAGNOSIS — M545 Low back pain, unspecified: Secondary | ICD-10-CM | POA: Diagnosis not present

## 2021-05-21 DIAGNOSIS — R109 Unspecified abdominal pain: Secondary | ICD-10-CM | POA: Insufficient documentation

## 2021-05-21 DIAGNOSIS — G8929 Other chronic pain: Secondary | ICD-10-CM | POA: Insufficient documentation

## 2021-05-21 DIAGNOSIS — R059 Cough, unspecified: Secondary | ICD-10-CM | POA: Insufficient documentation

## 2021-05-21 DIAGNOSIS — R300 Dysuria: Secondary | ICD-10-CM | POA: Diagnosis not present

## 2021-05-21 DIAGNOSIS — R1319 Other dysphagia: Secondary | ICD-10-CM | POA: Diagnosis not present

## 2021-05-21 LAB — POCT URINALYSIS DIP (MANUAL ENTRY)
Bilirubin, UA: NEGATIVE
Glucose, UA: NEGATIVE mg/dL
Ketones, POC UA: NEGATIVE mg/dL
Nitrite, UA: NEGATIVE
Protein Ur, POC: 30 mg/dL — AB
Spec Grav, UA: 1.01 (ref 1.010–1.025)
Urobilinogen, UA: 0.2 E.U./dL
pH, UA: 7 (ref 5.0–8.0)

## 2021-05-21 MED ORDER — CEPHALEXIN 500 MG PO CAPS: 500.0000 mg | ORAL_CAPSULE | Freq: Two times a day (BID) | ORAL | 0 refills | Status: AC

## 2021-05-21 NOTE — Discharge Instructions (Addendum)
Recommend start Keflex 500mg  twice a day as directed. Continue to push fluids. Recommend switch from Pepcid to Protonix (Pantoprazole) for now- Take Protonix twice a day for 1 week then once daily. Keep appointment with your GI specialist next month. Recommend call your PCP and schedule appointment for further evaluation of back pain. Follow-up pending urine culture results.

## 2021-05-21 NOTE — ED Triage Notes (Signed)
Pt presents with multiple complaints, states that she has had cough and nausea for a while and also having dysuria

## 2021-05-22 NOTE — ED Provider Notes (Signed)
[facility]    CSN: [id number] Arrival date & time: 05/21/21  0957      History   Chief Complaint Chief Complaint  Patient presents with   Dysuria   Cough    HPI Terry Allen is a 80 y.o. female.   80 year old female presents with dysuria, flank pain, nausea and lower back pain for the past week. Feels tired and weak. No distinct fever. Also having lower throat pain and continued cough. Having difficulty swallowing certain foods due to pain. Has history of GERD, Barrett's esophagus disease, and dysphagia in which her GI specialist has performed esophageal dilation twice before in 2019 and 2020. They are unable to see her until Oct 31st. She has taken Protonix and Prilosec in the past with some relief in cough and GI symptoms but currently on Pepcid. Also has history of recurrent cystitis with bladder sling and chronic pelvic discomfort. Mom history of breast cancer, Dad history of lung cancer and a brother with renal cancer. Other chronic health issues include hyperlipidemia, thyroid disorder, osteopenia, neuropathy and chronic back pain. Currently on Synthroid, Lipitor, Amitriptyline, Neurontin, and Mobic daily and Zofran and Tylenol prn. Indicates Zofran helps some but still nauseous. PCP with [facility] in [place], [place].   The history is provided by the patient.   Past Medical History:  Diagnosis Date   Acid reflux    Patient reports Barretts   Bulging lumbar disc    High cholesterol    Hypothyroidism    Nerve damage    Osteopenia    Pelvic pain in female    due to bladder sling    Patient Active Problem List   Diagnosis Date Noted   Colon cancer screening 09/15/2019   Esophageal dysphagia 05/20/2018   GERD (gastroesophageal reflux disease) 12/13/2014   Barrett's esophagus 12/13/2014   Hypothyroidism 12/13/2014   High cholesterol 12/13/2014    Past Surgical History:  Procedure Laterality Date   APPENDECTOMY  1960   BIOPSY  11/03/2019   Procedure: BIOPSY;   Surgeon: Rogene Houston, MD;  [facility]: [facility];  Service: Endoscopy;;   BLADDER SUSPENSION     CATARACT EXTRACTION W/PHACO Right 06/07/2019   Procedure: CATARACT EXTRACTION PHACO AND INTRAOCULAR LENS PLACEMENT ([place]);  Surgeon: Baruch Goldmann, MD;  Location: [facility];  Service: Ophthalmology;  Laterality: Right;  CDE: 10.87   CATARACT EXTRACTION W/PHACO Left 06/21/2019   Procedure: CATARACT EXTRACTION PHACO AND INTRAOCULAR LENS PLACEMENT ([facility]);  Surgeon: Baruch Goldmann, MD;  Location: [facility];  Service: Ophthalmology;  Laterality: Left;  CDE: 11.59   COLONOSCOPY N/A 11/03/2019   Procedure: COLONOSCOPY;  Surgeon: Rogene Houston, MD;  [facility];  Service: Endoscopy;  Laterality: N/A;  1245   ESOPHAGEAL DILATION N/A 05/27/2018   Procedure: ESOPHAGEAL DILATION;  Surgeon: Rogene Houston, MD;  [facility];  Service: Endoscopy;  Laterality: N/A;   ESOPHAGEAL DILATION N/A 02/12/2019   Procedure: ESOPHAGEAL DILATION;  Surgeon: Rogene Houston, MD;  [facility];  Service: Endoscopy;  Laterality: N/A;   ESOPHAGOGASTRODUODENOSCOPY N/A 12/21/2014   Procedure: ESOPHAGOGASTRODUODENOSCOPY (EGD);  Surgeon: Rogene Houston, MD;  [facility];  Service: Endoscopy;  Laterality: N/A;  210   ESOPHAGOGASTRODUODENOSCOPY N/A 06/16/2015   Procedure: ESOPHAGOGASTRODUODENOSCOPY (EGD);  Surgeon: Rogene Houston, MD;  [facility];  Service: Endoscopy;  Laterality: N/A;  1250   ESOPHAGOGASTRODUODENOSCOPY N/A 05/27/2018   Procedure: ESOPHAGOGASTRODUODENOSCOPY (EGD);  Surgeon: Rogene Houston, MD;  [facility];  Service: Endoscopy;  Laterality: N/A;  7:30   ESOPHAGOGASTRODUODENOSCOPY N/A 02/12/2019   Procedure: ESOPHAGOGASTRODUODENOSCOPY (EGD);  Surgeon: Rogene Houston, MD;  [facility];  Service: Endoscopy;  Laterality: N/A;    OB History   No obstetric history on file.      Home Medications    Prior to Admission medications    Medication Sig Start Date End Date Taking? Authorizing Provider  cephALEXin (KEFLEX) 500 MG capsule Take 1 capsule (500 mg total) by mouth 2 (two) times daily for 7 days. 05/21/21 05/28/21 Yes Easten Maceachern, Nicholes Stairs, NP  acetaminophen (TYLENOL) 500 MG tablet Take 500 mg by mouth every 6 (six) hours as needed for moderate pain.     [provider]  amitriptyline (ELAVIL) 50 MG tablet Take 50 mg by mouth at bedtime.  04/12/18   [provider]  atorvastatin (LIPITOR) 40 MG tablet Take 40 mg by mouth at bedtime.     [provider]  Calcium Carb-Cholecalciferol (CALCIUM 600 + D PO) Take 1 tablet by mouth 2 (two) times a day.    [provider]  Caraway Oil-Levomenthol (FDGARD PO) Take 1 tablet by mouth daily as needed (acid reflux).     [provider]  famotidine (PEPCID) 20 MG tablet Take 1 tablet (20 mg total) by mouth 2 (two) times daily. 09/15/19   Laurine Blazer B, PA-C  gabapentin (NEURONTIN) 300 MG capsule Take 2 capsules (600 mg total) by mouth 3 (three) times daily. Patient taking differently: Take 300 mg by mouth 3 (three) times daily.  10/23/15   Susy Frizzle, MD  levothyroxine (SYNTHROID) 75 MCG tablet Take 37.5-75 mcg by mouth See admin instructions. Take 37.5 mg on Mon, Wed, and Fri, Take 75 mcg on Sun, Tue, Thur, and Sat    [provider]  ondansetron (ZOFRAN) 4 MG tablet Take 4 mg by mouth every 8 (eight) hours as needed for nausea or vomiting.  05/31/19   [provider]    Family History Family History  Problem Relation Age of Onset   Breast cancer Mother    Heart disease Mother    Diabetes Mother    Cancer Mother        breast   Stroke Mother    Hypertension Mother    Lung cancer Father    Stroke Father    Cancer Father        oat cell cancer/lung   Kidney cancer Brother    Cancer Brother        renal cell cancer   Healthy Brother    Diabetes Son     Social History Social History   Tobacco Use   Smoking  status: Never   Smokeless tobacco: Never  Vaping Use   Vaping Use: Never used  Substance Use Topics   Alcohol use: No    Alcohol/week: 0.0 standard drinks   Drug use: No     Allergies   Patient has no known allergies.   Review of Systems Review of Systems  Constitutional:  Positive for activity change, appetite change, chills and fatigue. Negative for fever.  HENT:  Positive for sore throat, trouble swallowing and voice change. Negative for congestion, ear discharge, ear pain, facial swelling, mouth sores, postnasal drip and sinus pressure.   Respiratory:  Positive for cough. Negative for shortness of breath and wheezing.   Cardiovascular:  Negative for chest pain.  Gastrointestinal:  Positive for abdominal pain and nausea. Negative  for vomiting.  Genitourinary:  Positive for decreased urine volume, dysuria, flank pain, frequency and pelvic pain (chronic). Negative for difficulty urinating, hematuria and vaginal discharge.  Musculoskeletal:  Positive for arthralgias and back pain. Negative for neck pain.  Skin:  Negative for color change and rash.  Allergic/Immunologic: Negative for environmental allergies, food allergies and immunocompromised state.  Neurological:  Positive for weakness. Negative for dizziness, seizures, syncope, facial asymmetry, speech difficulty and light-headedness.  Hematological:  Negative for adenopathy. Does not bruise/bleed easily.    Physical Exam Triage Vital Signs ED Triage Vitals  Enc Vitals Group     BP 05/21/21 1238 128/73     Pulse Rate 05/21/21 1238 84     Resp 05/21/21 1238 18     Temp 05/21/21 1238 98.1 F (36.7 C)     Temp src --      SpO2 05/21/21 1238 98 %     Weight --      Height --      Head Circumference --      Peak Flow --      Pain Score 05/21/21 1240 3     Pain Loc --      Pain Edu? --      Excl. in [place]? --    No data found.  Updated Vital Signs BP 128/73   Pulse 84   Temp 98.1 F (36.7 C)   Resp 18   SpO2 98%    Visual Acuity Right Eye Distance:   Left Eye Distance:   Bilateral Distance:    Right Eye Near:   Left Eye Near:    Bilateral Near:     Physical Exam Vitals and nursing note reviewed.  Constitutional:      General: She is awake. She is not in acute distress.    Appearance: She is well-developed and underweight. She is ill-appearing.     Comments: She is sitting in the exam chair in no acute distress but appears uncomfortable due to pain.   HENT:     Head: Normocephalic and atraumatic.     Right Ear: Hearing, tympanic membrane, ear canal and external ear normal.     Left Ear: Hearing, tympanic membrane, ear canal and external ear normal.     Nose: Nose normal.     Mouth/Throat:     Lips: Pink.     Mouth: Mucous membranes are moist. No oral lesions.     Palate: No lesions.     Pharynx: Oropharynx is clear. Uvula midline. Posterior oropharyngeal erythema present. No pharyngeal swelling, oropharyngeal exudate or uvula swelling.  Eyes:     Extraocular Movements: Extraocular movements intact.     Conjunctiva/sclera: Conjunctivae normal.  Cardiovascular:     Rate and Rhythm: Normal rate and regular rhythm.     Heart sounds: Normal heart sounds. No murmur heard. Pulmonary:     Effort: Pulmonary effort is normal. No respiratory distress.     Breath sounds: Normal breath sounds and air entry. No decreased air movement. No decreased breath sounds, wheezing, rhonchi or rales.  Abdominal:     General: Abdomen is flat.     Palpations: Abdomen is soft.     Tenderness: There is abdominal tenderness. There is right CVA tenderness and left CVA tenderness. There is no guarding.  Musculoskeletal:     Cervical back: Normal range of motion and neck supple.     Lumbar back: Tenderness present. Decreased range of motion.  Lymphadenopathy:     Cervical: No cervical  adenopathy.  Skin:    General: Skin is warm and dry.     Capillary Refill: Capillary refill takes less than 2 seconds.      Findings: No bruising, erythema or rash.  Neurological:     General: No focal deficit present.     Mental Status: She is alert and oriented to person, place, and time.  Psychiatric:        Mood and Affect: Mood normal.        Behavior: Behavior normal. Behavior is cooperative.        Thought Content: Thought content normal.        Judgment: Judgment normal.     UC Treatments / Results  Labs (all labs ordered are listed, but only abnormal results are displayed) Labs Reviewed  POCT URINALYSIS DIP (MANUAL ENTRY) - Abnormal; Notable for the following components:      Result Value   Clarity, UA cloudy (*)    Blood, UA moderate (*)    Protein Ur, POC =30 (*)    Leukocytes, UA Large (3+) (*)    All other components within normal limits  URINE CULTURE    EKG   Radiology No results found.  Procedures Procedures (including critical care time)  Medications Ordered in UC Medications - No data to display  Initial Impression / Assessment and Plan / UC Course  I have reviewed the triage vital signs and the nursing notes.  Pertinent labs & imaging results that were available during my care of the patient were reviewed by me and considered in my medical decision making (see chart for details).    Reviewed urinalysis results with patient. Positive WBC's, protein, and blood- probable UTI. Uncertain what antibiotics she has taken recently and unable to look at notes from PCP. Patient indicates that she believes she has done well on Keflex in the past- will start Keflex 500mg  twice a day as directed. Continue to push fluids. Urine sent for culture.  Discussed throat and GI issues- need to follow-up with her GI specialist but since she has done better on Protonix in the past, recommend switch back to Protonix 40mg  twice a day for 1 week then once daily and stop Pepcid for now. Continue Zofran as needed for nausea. Discussed cough may be due to GERD but need further evaluation by PCP and GI  specialist. Continue other medications for back pain and call her PCP today to schedule appointment for follow-up. Also keep her appointment with her GI specialist as planned. Follow-up pending urine culture results.    Final Clinical Impressions(s) / UC Diagnoses   Final diagnoses:  Dysuria  Bilateral flank pain  Other dysphagia  Cough  Chronic bilateral low back pain without sciatica     Discharge Instructions      Recommend start Keflex 500mg  twice a day as directed. Continue to push fluids. Recommend switch from Pepcid to Protonix (Pantoprazole) for now- Take Protonix twice a day for 1 week then once daily. Keep appointment with your GI specialist next month. Recommend call your PCP and schedule appointment for further evaluation of back pain. Follow-up pending urine culture results.      ED Prescriptions     Medication Sig Dispense Auth. Provider   cephALEXin (KEFLEX) 500 MG capsule Take 1 capsule (500 mg total) by mouth 2 (two) times daily for 7 days. 14 capsule Lucio Litsey, Nicholes Stairs, NP      PDMP not reviewed this encounter.   Katy Apo, NP 05/22/21 1445

## 2021-05-26 LAB — URINE CULTURE: Culture: 30000 — AB

## 2021-07-09 ENCOUNTER — Ambulatory Visit (INDEPENDENT_AMBULATORY_CARE_PROVIDER_SITE_OTHER): Payer: PPO | Admitting: Gastroenterology

## 2021-07-09 ENCOUNTER — Encounter (INDEPENDENT_AMBULATORY_CARE_PROVIDER_SITE_OTHER): Payer: Self-pay | Admitting: Gastroenterology

## 2021-07-10 ENCOUNTER — Other Ambulatory Visit: Payer: Self-pay

## 2021-07-10 ENCOUNTER — Ambulatory Visit (INDEPENDENT_AMBULATORY_CARE_PROVIDER_SITE_OTHER): Payer: PPO | Admitting: Internal Medicine

## 2021-07-10 ENCOUNTER — Encounter (INDEPENDENT_AMBULATORY_CARE_PROVIDER_SITE_OTHER): Payer: Self-pay | Admitting: Internal Medicine

## 2021-07-10 VITALS — BP 137/72 | HR 79 | Temp 97.9°F | Ht 62.0 in | Wt 105.8 lb

## 2021-07-10 DIAGNOSIS — R1319 Other dysphagia: Secondary | ICD-10-CM | POA: Diagnosis not present

## 2021-07-10 DIAGNOSIS — K219 Gastro-esophageal reflux disease without esophagitis: Secondary | ICD-10-CM

## 2021-07-10 DIAGNOSIS — R1010 Upper abdominal pain, unspecified: Secondary | ICD-10-CM

## 2021-07-10 DIAGNOSIS — G8929 Other chronic pain: Secondary | ICD-10-CM

## 2021-07-10 NOTE — Progress Notes (Signed)
Presenting complaint;  Dysphagia and abdominal pain.  Database and subjective:  Patient is 80 year old Caucasian female who is here for scheduled visit.  Terry Allen was last seen in the office in January 2021. Terry Allen has chronic GERD history of esophageal web as well as history of abdominal pain. Her last EGD with ED was on 02/12/2019.  Esophagus was dilated by passing 48 [place] dilator.  Terry Allen did not report any improvement.  Terry Allen Terry Allen therefore had esophagogram on 02/26/2019 and there was no web or proximal esophageal stricture.  Barium pill passed from oral cavity to stomach without any delay.  This study also did not reveal any aspiration which was documented on prior study of September 2019. Terry Allen has history of upper abdominal pain for the last few years. Terry Allen has had multiple CTs(September 2016, October 2018 and September 2020) not revealing any abnormality to account for her pain.  Terry Allen reports recurrence of her dysphagia which started 3 to 4 months ago.  Terry Allen has difficulty with pills and food.  Terry Allen is a slow eater and chews her food well.  Terry Allen has not had any episode of food impaction.  Terry Allen states food and/or pills eventually passed down.  Heartburn is well controlled with famotidine.  Terry Allen takes Mylanta once or twice a week.  Terry Allen was recently seen at urgent care for cough and dysuria.  Terry Allen was treated with Keflex and advised to take pantoprazole which Terry Allen is not taking. Terry Allen also complains of upper abdominal pain.  Terry Allen says its been almost constant for the last 6 months.  This pain has no relationship to meals.  It does not get better or worse.  Similarly pain is not alleviated with bowel movements but at times posture helps.  Pain is never severe.  Terry Allen describes it to be dull pain.  Terry Allen takes Tylenol and Advil which helps.  Terry Allen does complain of intermittent nausea but denies vomiting melena or rectal bleeding.  Terry Allen says her appetite is good.  Terry Allen has gained 5 pounds since her last visit. Terry Allen has chronic  hypogastric pain related to prior bladder surgery.  Terry Allen says Terry Allen has developed nerve pain for which Terry Allen is taking gabapentin teen and it helps. Terry Allen also complains of falling episodes.  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  Terry Allen does not recall that Terry Allen tripped over something.  These episodes are not associated with lightheadedness or dizziness.  Terry Allen has not discussed this symptom with her primary care physician. Terry Allen has history of osteopenia.  He has not had follow-up study in 5 years.  Current Medications: Outpatient Encounter Medications as of 07/10/2021  Medication Sig   acetaminophen (TYLENOL) 500 MG tablet Take 500 mg by mouth every 6 (six) hours as needed for moderate pain.    amitriptyline (ELAVIL) 50 MG tablet Take 50 mg by mouth at bedtime.    atorvastatin (LIPITOR) 40 MG tablet Take 40 mg by mouth at bedtime.    Calcium Carb-Cholecalciferol (CALCIUM 600 + D PO) Take 1 tablet by mouth 2 (two) times a day.   Caraway Oil-Levomenthol (FDGARD PO) Take 1 tablet by mouth daily as needed (acid reflux).    famotidine (PEPCID) 20 MG tablet Take 1 tablet (20 mg total) by mouth 2 (two) times daily.   gabapentin (NEURONTIN) 300 MG capsule Take 2 capsules (600 mg total) by mouth 3 (three) times daily. (Patient taking differently: Take 300 mg by mouth 3 (three)  times daily.)   levothyroxine (SYNTHROID) 75 MCG tablet Take 37.5-75 mcg by mouth See admin instructions. Take 37.5 mg on Mon, Wed, and Fri, Take 75 mcg on Sun, Tue, Thur, and Sat   ondansetron (ZOFRAN) 4 MG tablet Take 4 mg by mouth every 8 (eight) hours as needed for nausea or vomiting.    No facility-administered encounter medications on file as of 07/10/2021.     Objective: Blood pressure 137/72, pulse 79, temperature 97.9 F (36.6 C), height '5\' 2"'  (1.575 m), weight 105 lb 12.8 oz (48 kg). Patient is alert and in no acute distress. Conjunctiva is pink. Sclera is  nonicteric Oropharyngeal mucosa is normal. No neck masses or thyromegaly noted. Cardiac exam with regular rhythm normal S1 and S2. No murmur or gallop noted. Lungs are clear to auscultation. Abdomen;  No LE edema or clubbing noted.  Labs/studies Results:   CBC Latest Ref Rng & Units 07/09/2017 06/30/2017 12/13/2016  WBC 4.0 - 10.5 K/uL 7.4 11.9(H) 6.2  Hemoglobin 12.0 - 15.0 g/dL 15.1(H) 13.2 13.2  Hematocrit 36.0 - 46.0 % 45.4 39.0 39.4  Platelets 150 - 400 K/uL 229 219 260    CMP Latest Ref Rng & Units 06/08/2019 07/09/2017 06/30/2017  Glucose 65 - 99 mg/dL - 79 -  BUN 6 - 20 mg/dL - 20 -  Creatinine 0.44 - 1.00 mg/dL 0.70 1.08(H) -  Sodium 135 - 145 mmol/L - 137 -  Potassium 3.5 - 5.1 mmol/L - 4.0 -  Chloride 101 - 111 mmol/L - 95(L) -  CO2 22 - 32 mmol/L - 28 -  Calcium 8.9 - 10.3 mg/dL - 10.7(H) -  Total Protein 6.1 - 8.1 g/dL - - 6.7  Total Bilirubin 0.2 - 1.2 mg/dL - - 1.0  Alkaline Phos 33 - 130 U/L - - -  AST 10 - 35 U/L - - 25  ALT 6 - 29 U/L - - 16    Hepatic Function Latest Ref Rng & Units 06/30/2017 12/13/2016 10/10/2016  Total Protein 6.1 - 8.1 g/dL 6.7 6.8 6.9  Albumin 3.6 - 5.1 g/dL - 3.8 4.2  AST 10 - 35 U/L '25 26 25  ' ALT 6 - 29 U/L '16 12 14  ' Alk Phosphatase 33 - 130 U/L - 43 39  Total Bilirubin 0.2 - 1.2 mg/dL 1.0 0.9 1.1  Bilirubin, Direct 0.0 - 0.2 mg/dL 0.2 - -    No results found for: CRP    Assessment:  #1.  Esophageal dysphagia.  Terry Allen has a history of esophageal web which was last disrupted in June 2020 follow-up barium study did not reveal any abnormality.  I suspect Terry Allen may have underlying esophageal motility disorder.  Terry Allen feels symptoms is not bad enough to proceed with further evaluation.  #2.  Chronic GERD.  Terry Allen has a history of short segment Barrett's esophagus but biopsies in October 2013 and April 2016 were negative for Barrett's.  2 last EGDs did not show any changes of Barrett's.  Heartburn appears to be well controlled with famotidine which  Terry Allen will continue as long as it is working.  #3.  Chronic upper abdominal pain.  I suspect Terry Allen has musculoskeletal pain.  Terry Allen can continue use Tylenol and or Advil at current dose which is 20 mg daily.  Terry Allen may also try OTC lidocaine patch.   No further work-up warranted at this time.  #4.  History of falling episodes.  Patient needs to arrange visit with Dr. Grandville Silos for further evaluation.  #5.  History of osteopia.  Last study was 5 years ago.   Plan:  Continue Tylenol use for upper abdominal pain as long as it is working.  Can take up to 2 g/day in divided doses. Advil 200 mg daily as needed with food. Patient will try OTC lidocaine patch if Tylenol and Advil does not provide any relief. Patient will call if dysphagia worsens. Patient advised to arrange follow-up visit with Dr. Grandville Silos regarding falling episodes and osteopenia. Office visit in 6 months.

## 2021-07-10 NOTE — Patient Instructions (Signed)
Can take Tylenol up to 2 g/day in divided dose for abdominal pain as long as it is working. If pain gets worse can try over-the-counter lidocaine patches. Notify if swallowing difficulty worsens. Please check with Dr. Grandville Silos about getting follow-up bone density study.

## 2021-07-23 DIAGNOSIS — Z6820 Body mass index (BMI) 20.0-20.9, adult: Secondary | ICD-10-CM | POA: Diagnosis not present

## 2021-07-23 DIAGNOSIS — N390 Urinary tract infection, site not specified: Secondary | ICD-10-CM | POA: Diagnosis not present

## 2021-08-22 DIAGNOSIS — R300 Dysuria: Secondary | ICD-10-CM | POA: Diagnosis not present

## 2021-08-22 DIAGNOSIS — Z6820 Body mass index (BMI) 20.0-20.9, adult: Secondary | ICD-10-CM | POA: Diagnosis not present

## 2021-09-03 DIAGNOSIS — R300 Dysuria: Secondary | ICD-10-CM | POA: Diagnosis not present

## 2021-09-17 ENCOUNTER — Encounter: Payer: Self-pay | Admitting: *Deleted

## 2021-10-12 ENCOUNTER — Encounter: Payer: Self-pay | Admitting: Urology

## 2021-10-12 ENCOUNTER — Ambulatory Visit: Payer: PPO | Admitting: Urology

## 2021-10-12 ENCOUNTER — Other Ambulatory Visit: Payer: Self-pay

## 2021-10-12 VITALS — BP 127/65 | HR 65 | Wt 106.0 lb

## 2021-10-12 DIAGNOSIS — R8290 Unspecified abnormal findings in urine: Secondary | ICD-10-CM

## 2021-10-12 DIAGNOSIS — N3941 Urge incontinence: Secondary | ICD-10-CM

## 2021-10-12 DIAGNOSIS — N390 Urinary tract infection, site not specified: Secondary | ICD-10-CM

## 2021-10-12 LAB — URINALYSIS, ROUTINE W REFLEX MICROSCOPIC
Bilirubin, UA: NEGATIVE
Glucose, UA: NEGATIVE
Ketones, UA: NEGATIVE
Nitrite, UA: POSITIVE — AB
Specific Gravity, UA: 1.02 (ref 1.005–1.030)
Urobilinogen, Ur: 0.2 mg/dL (ref 0.2–1.0)
pH, UA: 5.5 (ref 5.0–7.5)

## 2021-10-12 LAB — BLADDER SCAN AMB NON-IMAGING: Scan Result: 42

## 2021-10-12 LAB — MICROSCOPIC EXAMINATION
Renal Epithel, UA: NONE SEEN /hpf
WBC, UA: >30 /hpf — AB (ref 0–5)

## 2021-10-12 MED ORDER — MIRABEGRON ER 50 MG PO TB24: 50.0000 mg | ORAL_TABLET | Freq: Every day | ORAL | 0 refills | Status: DC

## 2021-10-12 NOTE — Progress Notes (Signed)
Urological Symptom Review  Patient is experiencing the following symptoms: Frequent urination Burning/pain with urination Leakage of urine Weak stream   Review of Systems  Gastrointestinal (upper)  : Nausea  Gastrointestinal (lower) : Negative for lower GI symptoms  Constitutional : Negative for symptoms  Skin: Negative for skin symptoms  Eyes: Negative for eye symptoms  Ear/Nose/Throat : Negative for Ear/Nose/Throat symptoms  Hematologic/Lymphatic: Negative for Hematologic/Lymphatic symptoms  Cardiovascular : Negative for cardiovascular symptoms  Respiratory : Negative for respiratory symptoms  Endocrine: Negative for endocrine symptoms  Musculoskeletal: Negative for musculoskeletal symptoms  Neurological: Negative for neurological symptoms  Psychologic: Negative for psychiatric symptoms

## 2021-10-12 NOTE — Progress Notes (Signed)
Assessment: 1. Recurrent UTI   2. Urge incontinence   3. Abnormal urine findings     Plan: Urine culture sent today Will call with results and recommendations for treatment Request all urine culture results from [facility] to reduce the risk of recurrent UTIs including increased fluid intake, timed and double voiding, daily cranberry supplement, and daily probiotic reviewed. Trial of Myrbetriq 50 mg daily.  Samples given. Return to office in 4 weeks for pelvic exam and cystoscopy  Chief Complaint:  Chief Complaint  Patient presents with   Recurrent UTI    History of Present Illness:  Terry Allen is a 81 y.o. year old female who is seen in consultation from Bonnita Hollow, MD for evaluation of UTI's and lower urinary tract symptoms.   She has a history of chronic UTIs and has been followed by Dr. Gaynelle Arabian and Dr. Gloriann Loan at [facility] urology.  She is status post a Pinnacle bladder sling and transobturator urethral sling by Dr. Gaetano Net in 2009.  She was treated for vaginal mesh extrusion by Dr. Burke Keels in August 2011.  She has had longstanding symptoms of frequency, urgency, and urge incontinence.  She has previously been managed with medical therapy for her symptoms.  She also has a history of recurrent UTIs.  She was previously treated with daily antibiotic prophylaxis but has not been on this for some time.  At her last visit in February 2022, she did not have evidence of UTI.  Renal ultrasound from 1/22 did not demonstrate any renal or bladder abnormalities.  She was being managed with vaginal Estrace cream and a daily probiotic.  She reports recurrent UTIs since June 2022.  Typical symptoms include low back pain, pelvic pain, nausea, and some dysuria.  She continues with her baseline symptoms of frequency, urgency, nocturia x2, and urge incontinence.  No flank pain or gross hematuria.  She was last treated for a UTI approximately 1 week ago.   Urine  culture from 9/22 grew 30 K colonies of E. coli.  No recent culture results available. She currently reports suprapubic discomfort, frequency, burning sensation in the bladder area, urgency, and incontinence. She does report symptoms of fecal incontinence.   Past Medical History:  Past Medical History:  Diagnosis Date   Acid reflux    Patient reports Barretts   Bulging lumbar disc    High cholesterol    Hypothyroidism    Nerve damage    Osteopenia    Pelvic pain in female    due to bladder sling    Past Surgical History:  Past Surgical History:  Procedure Laterality Date   APPENDECTOMY  1960   BIOPSY  11/03/2019   Procedure: BIOPSY;  Surgeon: Rogene Houston, MD;  [facility];  Service: Endoscopy;;   BLADDER SUSPENSION     CATARACT EXTRACTION W/PHACO Right 06/07/2019   Procedure: CATARACT EXTRACTION PHACO AND INTRAOCULAR LENS PLACEMENT ([place]);  Surgeon: Baruch Goldmann, MD;  Location: [facility];  Service: Ophthalmology;  Laterality: Right;  CDE: 10.87   CATARACT EXTRACTION W/PHACO Left 06/21/2019   Procedure: CATARACT EXTRACTION PHACO AND INTRAOCULAR LENS PLACEMENT (IOC);  Surgeon: Baruch Goldmann, MD;  Location: [facility];  Service: Ophthalmology;  Laterality: Left;  CDE: 11.59   COLONOSCOPY N/A 11/03/2019   Procedure: COLONOSCOPY;  Surgeon: Rogene Houston, MD;  [facility];  Service: Endoscopy;  Laterality: N/A;  1245   ESOPHAGEAL DILATION N/A 05/27/2018   Procedure: ESOPHAGEAL DILATION;  Surgeon: Laural Golden,  Mechele Dawley, MD;  [facility];  Service: Endoscopy;  Laterality: N/A;   ESOPHAGEAL DILATION N/A 02/12/2019   Procedure: ESOPHAGEAL DILATION;  Surgeon: Rogene Houston, MD;  [facility];  Service: Endoscopy;  Laterality: N/A;   ESOPHAGOGASTRODUODENOSCOPY N/A 12/21/2014   Procedure: ESOPHAGOGASTRODUODENOSCOPY (EGD);  Surgeon: Rogene Houston, MD;  [facility];  Service: Endoscopy;  Laterality: N/A;  210   ESOPHAGOGASTRODUODENOSCOPY  N/A 06/16/2015   Procedure: ESOPHAGOGASTRODUODENOSCOPY (EGD);  Surgeon: Rogene Houston, MD;  [facility];  Service: Endoscopy;  Laterality: N/A;  1250   ESOPHAGOGASTRODUODENOSCOPY N/A 05/27/2018   Procedure: ESOPHAGOGASTRODUODENOSCOPY (EGD);  Surgeon: Rogene Houston, MD;  [facility];  Service: Endoscopy;  Laterality: N/A;  7:30   ESOPHAGOGASTRODUODENOSCOPY N/A 02/12/2019   Procedure: ESOPHAGOGASTRODUODENOSCOPY (EGD);  Surgeon: Rogene Houston, MD;  [facility];  Service: Endoscopy;  Laterality: N/A;    Allergies:  No Known Allergies  Family History:  Family History  Problem Relation Age of Onset   Breast cancer Mother    Heart disease Mother    Diabetes Mother    Cancer Mother        breast   Stroke Mother    Hypertension Mother    Lung cancer Father    Stroke Father    Cancer Father        oat cell cancer/lung   Kidney cancer Brother    Cancer Brother        renal cell cancer   Healthy Brother    Diabetes Son     Social History:  Social History   Tobacco Use   Smoking status: Never   Smokeless tobacco: Never  Vaping Use   Vaping Use: Never used  Substance Use Topics   Alcohol use: No    Alcohol/week: 0.0 standard drinks   Drug use: No    Review of symptoms:  Constitutional:  Negative for unexplained weight loss, night sweats, fever, chills ENT:  Negative for nose bleeds, sinus pain, painful swallowing CV:  Negative for chest pain, shortness of breath, exercise intolerance, palpitations, loss of consciousness Resp:  Negative for cough, wheezing, shortness of breath GI:  Negative for nausea, vomiting, diarrhea, bloody stools GU:  Positives noted in HPI; otherwise negative for gross hematuria Neuro:  Negative for seizures, poor balance, limb weakness, slurred speech Psych:  Negative for lack of energy, depression, anxiety Endocrine:  Negative for polydipsia, polyuria, symptoms of hypoglycemia (dizziness, hunger,  sweating) Hematologic:  Negative for anemia, purpura, petechia, prolonged or excessive bleeding, use of anticoagulants  Allergic:  Negative for difficulty breathing or choking as a result of exposure to anything; no shellfish allergy; no allergic response (rash/itch) to materials, foods  Physical exam: BP 127/65    Pulse 65    Wt 106 lb (48.1 kg)    BMI 19.39 kg/m  GENERAL APPEARANCE:  Well appearing, well developed, well nourished, NAD HEENT: Atraumatic, Normocephalic, oropharynx clear. NECK: Supple without lymphadenopathy or thyromegaly. LUNGS: Clear to auscultation bilaterally. HEART: Regular Rate and Rhythm without murmurs, gallops, or rubs. ABDOMEN: Soft, non-tender, No Masses. EXTREMITIES: Moves all extremities well.  Without clubbing, cyanosis, or edema. NEUROLOGIC:  Alert and oriented x 3, normal gait, CN II-XII grossly intact.  MENTAL STATUS:  Appropriate. BACK:  Non-tender to palpation.  No CVAT SKIN:  Warm, dry and intact.    Results: U/A: >30 WBCs, 11-30 RBCs, many bacteria, nitrite positive  Results for orders placed or performed in visit on  10/12/21 (from the past 24 hour(s))  BLADDER SCAN AMB NON-IMAGING   Collection Time: 10/12/21 12:46 PM  Result Value Ref Range   Scan Result 42 ml

## 2021-10-12 NOTE — Progress Notes (Signed)
post void residual =42ml 

## 2021-10-15 ENCOUNTER — Other Ambulatory Visit: Payer: Self-pay

## 2021-10-15 LAB — URINE CULTURE

## 2021-10-15 MED ORDER — NITROFURANTOIN MONOHYD MACRO 100 MG PO CAPS: ORAL_CAPSULE | ORAL | 0 refills | Status: DC

## 2021-10-15 NOTE — Addendum Note (Signed)
Addended by: Primus Bravo on: 10/15/2021 04:55 PM   Modules accepted: Orders

## 2021-10-16 ENCOUNTER — Telehealth: Payer: Self-pay

## 2021-10-16 NOTE — Telephone Encounter (Signed)
-----   Message from Primus Bravo, MD sent at 10/15/2021  4:55 PM EST ----- Please notify patient that her urine culture shows evidence of a UTI.   I would like for her to begin Macrobid BID x 10 days, then take Macrobid daily for UTI prevention.  Rx sent. Keep f/u appt as scheduled.

## 2021-10-16 NOTE — Telephone Encounter (Signed)
See other note

## 2021-10-16 NOTE — Telephone Encounter (Signed)
Patient called and made aware.

## 2021-11-08 ENCOUNTER — Other Ambulatory Visit: Payer: PPO | Admitting: Urology

## 2021-11-08 DIAGNOSIS — R8290 Unspecified abnormal findings in urine: Secondary | ICD-10-CM

## 2021-11-08 DIAGNOSIS — N3941 Urge incontinence: Secondary | ICD-10-CM

## 2021-11-08 NOTE — Progress Notes (Deleted)
? ?Assessment: ?1. Urge incontinence   ?2. Recurrent UTI   ? ? ?Plan: ?Urine culture sent today ?Will call with results and recommendations for treatment ?Request all urine culture results from [facility] ?Methods to reduce the risk of recurrent UTIs including increased fluid intake, timed and double voiding, daily cranberry supplement, and daily probiotic reviewed. ?Trial of Myrbetriq 50 mg daily.  Samples given. ?Return to office in 4 weeks for pelvic exam and cystoscopy ? ?Chief Complaint:  ?No chief complaint on file. ? ? ?History of Present Illness: ? ?Terry Allen is a 81 y.o. year old female who is seen for further evaluation of UTI's and lower urinary tract symptoms.   ?She has a history of chronic UTIs and has been followed by Dr. Gaynelle Arabian and Dr. Gloriann Loan at [facility].  She is status post a Pinnacle bladder sling and transobturator urethral sling by Dr. Gaetano Net in 2009.  She was treated for vaginal mesh extrusion by Dr. Burke Keels in August 2011.  She has had longstanding symptoms of frequency, urgency, and urge incontinence.  She has previously been managed with medical therapy for her symptoms.  She also has a history of recurrent UTIs.  She was previously treated with daily antibiotic prophylaxis but has not been on this for some time.  At her last visit in February 2022, she did not have evidence of UTI.  Renal ultrasound from 1/22 did not demonstrate any renal or bladder abnormalities.  She was being managed with vaginal Estrace cream and a daily probiotic.  She reports recurrent UTIs since June 2022.  Typical symptoms include low back pain, pelvic pain, nausea, and some dysuria.  She continues with her baseline symptoms of frequency, urgency, nocturia x2, and urge incontinence.  No flank pain or gross hematuria.  She was last treated for a UTI approximately 1 week ago.   ?Urine culture from 9/22 grew 30 K colonies of E. coli.  No recent culture results available. ?At her  initial visit in 2/23, she reported suprapubic discomfort, frequency, burning sensation in the bladder area, urgency, and incontinence. ?She also reported symptoms of fecal incontinence. ?Urine culture from 10/15/2021 grew >100 K E. coli.  She was treated with Macrobid and started on daily Macrobid for UTI prevention. ?She was also given a trial of Myrbetriq 50 mg daily for management of her urge incontinence. ? ?She presents today for further evaluation with cystoscopy and pelvic examination. ? ?Portions of the above documentation were copied from a prior visit for review purposes only. ? ? ? ?Past Medical History:  ?Past Medical History:  ?Diagnosis Date  ? Acid reflux   ? Patient reports Barretts  ? Bulging lumbar disc   ? High cholesterol   ? Hypothyroidism   ? Nerve damage   ? Osteopenia   ? Pelvic pain in female   ? due to bladder sling  ? ? ?Past Surgical History:  ?Past Surgical History:  ?Procedure Laterality Date  ? APPENDECTOMY  1960  ? BIOPSY  11/03/2019  ? Procedure: BIOPSY;  Surgeon: Rogene Houston, MD;  [facility];  Service: Endoscopy;;  ? BLADDER SUSPENSION    ? CATARACT EXTRACTION W/PHACO Right 06/07/2019  ? Procedure: CATARACT EXTRACTION PHACO AND INTRAOCULAR LENS PLACEMENT (IOC);  Surgeon: Baruch Goldmann, MD;  Location: [facility];  Service: Ophthalmology;  Laterality: Right;  CDE: 10.87  ? CATARACT EXTRACTION W/PHACO Left 06/21/2019  ? Procedure: CATARACT EXTRACTION PHACO AND INTRAOCULAR LENS PLACEMENT (IOC);  Surgeon: Baruch Goldmann,  MD;  Location: [facility];  Service: Ophthalmology;  Laterality: Left;  CDE: 11.59  ? COLONOSCOPY N/A 11/03/2019  ? Procedure: COLONOSCOPY;  Surgeon: Rogene Houston, MD;  [facility];  Service: Endoscopy;  Laterality: N/A;  1245  ? ESOPHAGEAL DILATION N/A 05/27/2018  ? Procedure: ESOPHAGEAL DILATION;  Surgeon: Rogene Houston, MD;  [facility];  Service: Endoscopy;  Laterality: N/A;  ? ESOPHAGEAL DILATION N/A 02/12/2019  ? Procedure:  ESOPHAGEAL DILATION;  Surgeon: Rogene Houston, MD;  [facility];  Service: Endoscopy;  Laterality: N/A;  ? ESOPHAGOGASTRODUODENOSCOPY N/A 12/21/2014  ? Procedure: ESOPHAGOGASTRODUODENOSCOPY (EGD);  Surgeon: Rogene Houston, MD;  [facility];  Service: Endoscopy;  Laterality: N/A;  210  ? ESOPHAGOGASTRODUODENOSCOPY N/A 06/16/2015  ? Procedure: ESOPHAGOGASTRODUODENOSCOPY (EGD);  Surgeon: Rogene Houston, MD;  [facility];  Service: Endoscopy;  Laterality: N/A;  1250  ? ESOPHAGOGASTRODUODENOSCOPY N/A 05/27/2018  ? Procedure: ESOPHAGOGASTRODUODENOSCOPY (EGD);  Surgeon: Rogene Houston, MD;  [facility];  Service: Endoscopy;  Laterality: N/A;  7:30  ? ESOPHAGOGASTRODUODENOSCOPY N/A 02/12/2019  ? Procedure: ESOPHAGOGASTRODUODENOSCOPY (EGD);  Surgeon: Rogene Houston, MD;  [facility];  Service: Endoscopy;  Laterality: N/A;  ? ? ?Allergies:  ?No Known Allergies ? ?Family History:  ?Family History  ?Problem Relation Age of Onset  ? Breast cancer Mother   ? Heart disease Mother   ? Diabetes Mother   ? Cancer Mother   ?     breast  ? Stroke Mother   ? Hypertension Mother   ? Lung cancer Father   ? Stroke Father   ? Cancer Father   ?     oat cell cancer/lung  ? Kidney cancer Brother   ? Cancer Brother   ?     renal cell cancer  ? Healthy Brother   ? Diabetes Son   ? ? ?Social History:  ?Social History  ? ?Tobacco Use  ? Smoking status: Never  ? Smokeless tobacco: Never  ?Vaping Use  ? Vaping Use: Never used  ?Substance Use Topics  ? Alcohol use: No  ?  Alcohol/week: 0.0 standard drinks  ? Drug use: No  ? ? ?ROS: ?Constitutional:  Negative for fever, chills, weight loss ?CV: Negative for chest pain, previous MI, hypertension ?Respiratory:  Negative for shortness of breath, wheezing, sleep apnea, frequent cough ?GI:  Negative for nausea, vomiting, bloody stool, GERD ? ?Physical exam: ?There were no vitals taken for this visit. ?*** ? ?Results: ? ? ?Procedure:  Flexible  Cystourethroscopy ? ?Pre-operative Diagnosis: {cysto diagnosis:26394} ? ?Post-operative Diagnosis: {cysto diagnosis:26394} ? ?Anesthesia:  local with lidocaine jelly ? ?Surgical Narrative: ? ?After appropriate informed consent was obtained, the patient was prepped and draped in the usual sterile fashion in the supine position.  The patient was correctly identified and the proper procedure delineated prior to proceeding.  Sterile lidocaine gel was instilled in the urethra. ?The flexible cystoscope was introduced without difficulty. ? ?Findings: ? ?Urethra: {anterior urethral findings:26395} ? ?Bladder: {bladder findings:26397} ? ?Ureteral orifices: {Normal/Abnormal Appearance:21344::"normal"} ? ?Additional findings: ? ?Saline bladder wash for cytology {WAS/WAS NOT:[phone]::"was not"} performed.   ? ?The cystoscope was then removed.  The patient tolerated the procedure well. ? ? ? ?

## 2021-11-30 DIAGNOSIS — N3000 Acute cystitis without hematuria: Secondary | ICD-10-CM | POA: Diagnosis not present

## 2021-11-30 DIAGNOSIS — Z6820 Body mass index (BMI) 20.0-20.9, adult: Secondary | ICD-10-CM | POA: Diagnosis not present

## 2021-11-30 DIAGNOSIS — I10 Essential (primary) hypertension: Secondary | ICD-10-CM | POA: Diagnosis not present

## 2021-11-30 DIAGNOSIS — M2550 Pain in unspecified joint: Secondary | ICD-10-CM | POA: Diagnosis not present

## 2021-12-03 ENCOUNTER — Ambulatory Visit (INDEPENDENT_AMBULATORY_CARE_PROVIDER_SITE_OTHER): Payer: Medicare HMO | Admitting: Physician Assistant

## 2021-12-03 ENCOUNTER — Other Ambulatory Visit: Payer: Self-pay

## 2021-12-03 VITALS — BP 129/69 | HR 78 | Ht 62.0 in | Wt 110.0 lb

## 2021-12-03 DIAGNOSIS — N3941 Urge incontinence: Secondary | ICD-10-CM

## 2021-12-03 DIAGNOSIS — N390 Urinary tract infection, site not specified: Secondary | ICD-10-CM

## 2021-12-03 LAB — URINALYSIS, ROUTINE W REFLEX MICROSCOPIC
Bilirubin, UA: NEGATIVE
Glucose, UA: NEGATIVE
Ketones, UA: NEGATIVE
Leukocytes,UA: NEGATIVE
Nitrite, UA: NEGATIVE
Protein,UA: NEGATIVE
Specific Gravity, UA: 1.015 (ref 1.005–1.030)
Urobilinogen, Ur: 0.2 mg/dL (ref 0.2–1.0)
pH, UA: 7.5 (ref 5.0–7.5)

## 2021-12-03 LAB — MICROSCOPIC EXAMINATION
Bacteria, UA: NONE SEEN
Epithelial Cells (non renal): NONE SEEN /hpf (ref 0–10)
Renal Epithel, UA: NONE SEEN /hpf

## 2021-12-03 NOTE — Progress Notes (Signed)
? ?Assessment: ?1. Recurrent UTI   ?2. Urge incontinence   ? ? ?Plan: ?Pt will resume Myrbetriq and practice timed voiding as discussed. Samples given. FU for cysto and pelvic as previously scheduled with Dr. Felipa Eth. Advised to FU with her PCP ASAP for evaluation of her constipation and stool incontinence- concerning for impaction with overflow of stool.  Requested recent culture results from [facility]. ? ?Chief Complaint: ?No chief complaint on file. ? ? ?HPI: ?Terry Allen is a 81 y.o. female who presents for evaluation of urinary burning, incontinence, recent urinary tract infection.  She states she was diagnosed with UTI approximately 2 weeks ago and treated with Bactrim DS.  No gross hematuria.  She was on Myrbetriq 50 mg in the past but states she has been off it for approximately 10 days when she ran out and never refilled the prescription.  She also complains of persistent back pelvis and lower extremity pain. Pt admits to stool incontinence and urgency for past year with h/o constipation for years.  Stool softeners worsen her diarrhea.  She has not addressed this with her primary care provider.  Pt missed cysto appt with Dr. Felipa Eth on 11/08/21.  Urine culture on 10/12/2021 was positive for E. coli, greater than 100,000 colonies, pansensitive. ? ?10/12/21 ?Terry Allen is a 81 y.o. year old female who is seen in consultation from Bonnita Hollow, MD for evaluation of UTI's and lower urinary tract symptoms.   ?She has a history of chronic UTIs and has been followed by Dr. Gaynelle Arabian and Dr. Gloriann Loan at [facility] urology.  She is status post a Pinnacle bladder sling and transobturator urethral sling by Dr. Gaetano Net in 2009.  She was treated for vaginal mesh extrusion by Dr. Burke Keels in August 2011.  She has had longstanding symptoms of frequency, urgency, and urge incontinence.  She has previously been managed with medical therapy for her symptoms.  She also has a history of recurrent UTIs.   She was previously treated with daily antibiotic prophylaxis but has not been on this for some time.  At her last visit in February 2022, she did not have evidence of UTI.  Renal ultrasound from 1/22 did not demonstrate any renal or bladder abnormalities.  She was being managed with vaginal Estrace cream and a daily probiotic.  She reports recurrent UTIs since June 2022.  Typical symptoms include low back pain, pelvic pain, nausea, and some dysuria.  She continues with her baseline symptoms of frequency, urgency, nocturia x2, and urge incontinence.  No flank pain or gross hematuria.  She was last treated for a UTI approximately 1 week ago.   ?Urine culture from 9/22 grew 30 K colonies of E. coli.  No recent culture results available. ?She currently reports suprapubic discomfort, frequency, burning sensation in the bladder area, urgency, and incontinence. ?She does report symptoms of fecal incontinence. ?Portions of the above documentation were copied from a prior visit for review purposes only. ? ?Allergies: ?No Known Allergies ? ?PMH: ?Past Medical History:  ?Diagnosis Date  ? Acid reflux   ? Patient reports Barretts  ? Bulging lumbar disc   ? High cholesterol   ? Hypothyroidism   ? Nerve damage   ? Osteopenia   ? Pelvic pain in female   ? due to bladder sling  ? ? ?PSH: ?Past Surgical History:  ?Procedure Laterality Date  ? APPENDECTOMY  1960  ? BIOPSY  11/03/2019  ? Procedure: BIOPSY;  Surgeon: Rogene Houston, MD;  [facility];  Service: Endoscopy;;  ? BLADDER SUSPENSION    ? CATARACT EXTRACTION W/PHACO Right 06/07/2019  ? Procedure: CATARACT EXTRACTION PHACO AND INTRAOCULAR LENS PLACEMENT (IOC);  Surgeon: Baruch Goldmann, MD;  Location: [facility];  Service: Ophthalmology;  Laterality: Right;  CDE: 10.87  ? CATARACT EXTRACTION W/PHACO Left 06/21/2019  ? Procedure: CATARACT EXTRACTION PHACO AND INTRAOCULAR LENS PLACEMENT (IOC);  Surgeon: Baruch Goldmann, MD;  Location: [facility];  Service: Ophthalmology;   Laterality: Left;  CDE: 11.59  ? COLONOSCOPY N/A 11/03/2019  ? Procedure: COLONOSCOPY;  Surgeon: Rogene Houston, MD;  [facility];  Service: Endoscopy;  Laterality: N/A;  1245  ? ESOPHAGEAL DILATION N/A 05/27/2018  ? Procedure: ESOPHAGEAL DILATION;  Surgeon: Rogene Houston, MD;  [facility];  Service: Endoscopy;  Laterality: N/A;  ? ESOPHAGEAL DILATION N/A 02/12/2019  ? Procedure: ESOPHAGEAL DILATION;  Surgeon: Rogene Houston, MD;  [facility];  Service: Endoscopy;  Laterality: N/A;  ? ESOPHAGOGASTRODUODENOSCOPY N/A 12/21/2014  ? Procedure: ESOPHAGOGASTRODUODENOSCOPY (EGD);  Surgeon: Rogene Houston, MD;  [facility];  Service: Endoscopy;  Laterality: N/A;  210  ? ESOPHAGOGASTRODUODENOSCOPY N/A 06/16/2015  ? Procedure: ESOPHAGOGASTRODUODENOSCOPY (EGD);  Surgeon: Rogene Houston, MD;  [facility];  Service: Endoscopy;  Laterality: N/A;  1250  ? ESOPHAGOGASTRODUODENOSCOPY N/A 05/27/2018  ? Procedure: ESOPHAGOGASTRODUODENOSCOPY (EGD);  Surgeon: Rogene Houston, MD;  [facility];  Service: Endoscopy;  Laterality: N/A;  7:30  ? ESOPHAGOGASTRODUODENOSCOPY N/A 02/12/2019  ? Procedure: ESOPHAGOGASTRODUODENOSCOPY (EGD);  Surgeon: Rogene Houston, MD;  [facility];  Service: Endoscopy;  Laterality: N/A;  ? ? ?SH: ?Social History  ? ?Tobacco Use  ? Smoking status: Never  ? Smokeless tobacco: Never  ?Vaping Use  ? Vaping Use: Never used  ?Substance Use Topics  ? Alcohol use: No  ?  Alcohol/week: 0.0 standard drinks  ? Drug use: No  ? ? ?ROS: ?Constitutional:  Negative for fever, chills, weight loss ?CV: Negative for chest pain ?Respiratory:  Negative for shortness of breath, wheezing, sleep apnea, frequent cough ?GI:  Negative for nausea, vomiting, bloody stool, GERD ? ?PE: ?BP 129/69   Pulse 78   Ht '5\' 2"'$  (1.575 m)   Wt 110 lb (49.9 kg)   BMI 20.12 kg/m?  ?GENERAL APPEARANCE:  Well appearing, well developed, well nourished, NAD ?HEENT:   Atraumatic, normocephalic ?NECK:  Supple. Trachea midline ?ABDOMEN:  Soft, non-tender, no masses ?EXTREMITIES:  Moves all extremities well ?NEUROLOGIC:  Alert and oriented x 3 ?MENTAL STATUS:  appropriate ?BACK:  Non-tender to palpation, No CVAT ?SKIN:  Warm, dry, and intact ? ?Results: ?Laboratory Data: ?Lab Results  ?Component Value Date  ? WBC 7.4 07/09/2017  ? HGB 15.1 (H) 07/09/2017  ? HCT 45.4 07/09/2017  ? MCV 97.6 07/09/2017  ? PLT 229 07/09/2017  ? ? ?Lab Results  ?Component Value Date  ? CREATININE 0.70 06/08/2019  ? ? ? ?Urinalysis ?   ?Component Value Date/Time  ? [place] YELLOW 07/09/2017 1547  ? APPEARANCEUR Cloudy (A) 10/12/2021 1231  ? LABSPEC 1.023 07/09/2017 1547  ? PHURINE 5.0 07/09/2017 1547  ? GLUCOSEU Negative 10/12/2021 1231  ? HGBUR LARGE (A) 07/09/2017 1547  ? BILIRUBINUR Negative 10/12/2021 1231  ? KETONESUR negative 05/21/2021 1251  ? [place] NEGATIVE 07/09/2017 1547  ? PROTEINUR 1+ (A) 10/12/2021 1231  ? PROTEINUR >=300 (A) 07/09/2017 1547  ? UROBILINOGEN 0.2 05/21/2021 1251  ? UROBILINOGEN 0.2 11/10/2007 1158  ?  NITRITE Positive (A) 10/12/2021 1231  ? NITRITE NEGATIVE 07/09/2017 1547  ? LEUKOCYTESUR 2+ (A) 10/12/2021 1231  ? ? ?Lab Results  ?Component Value Date  ? LABMICR See below: 10/12/2021  ? WBCUA >30 (A) 10/12/2021  ? LABEPIT 0-10 10/12/2021  ? BACTERIA Many (A) 10/12/2021  ? ? ?Pertinent Imaging: ? ?Results for orders placed during the hospital encounter of 06/01/15 ? ?DG Abd 1 View ? ?Narrative ?CLINICAL DATA:  Abdominal pain. ? ?EXAM: ?ABDOMEN - 1 VIEW ? ?COMPARISON:  None. ? ?FINDINGS: ?Moderate amount of stool throughout the colon. There is no bowel ?dilatation to suggest obstruction. There is no evidence of ?pneumoperitoneum, portal venous gas or pneumatosis. There are no ?pathologic calcifications along the expected course of the ?ureters.The osseous structures are unremarkable. ? ?IMPRESSION: ?Negative. ? ? ?Electronically Signed ?By: Kathreen Devoid ?On: 06/01/2015  12:57 ? ? ?No results found for this or any previous visit (from the past 24 hour(s)).  ?

## 2021-12-04 DIAGNOSIS — R296 Repeated falls: Secondary | ICD-10-CM | POA: Diagnosis not present

## 2021-12-04 DIAGNOSIS — Z87440 Personal history of urinary (tract) infections: Secondary | ICD-10-CM | POA: Diagnosis not present

## 2021-12-04 DIAGNOSIS — Z0001 Encounter for general adult medical examination with abnormal findings: Secondary | ICD-10-CM | POA: Diagnosis not present

## 2021-12-04 DIAGNOSIS — Z6820 Body mass index (BMI) 20.0-20.9, adult: Secondary | ICD-10-CM | POA: Diagnosis not present

## 2021-12-04 DIAGNOSIS — E782 Mixed hyperlipidemia: Secondary | ICD-10-CM | POA: Diagnosis not present

## 2021-12-04 DIAGNOSIS — I10 Essential (primary) hypertension: Secondary | ICD-10-CM | POA: Diagnosis not present

## 2021-12-04 DIAGNOSIS — R030 Elevated blood-pressure reading, without diagnosis of hypertension: Secondary | ICD-10-CM | POA: Diagnosis not present

## 2021-12-04 DIAGNOSIS — Z23 Encounter for immunization: Secondary | ICD-10-CM | POA: Diagnosis not present

## 2021-12-04 DIAGNOSIS — E7849 Other hyperlipidemia: Secondary | ICD-10-CM | POA: Diagnosis not present

## 2021-12-04 DIAGNOSIS — E039 Hypothyroidism, unspecified: Secondary | ICD-10-CM | POA: Diagnosis not present

## 2021-12-04 DIAGNOSIS — M2550 Pain in unspecified joint: Secondary | ICD-10-CM | POA: Diagnosis not present

## 2021-12-04 DIAGNOSIS — H9319 Tinnitus, unspecified ear: Secondary | ICD-10-CM | POA: Diagnosis not present

## 2021-12-11 ENCOUNTER — Encounter: Payer: Self-pay | Admitting: Urology

## 2021-12-11 ENCOUNTER — Ambulatory Visit: Payer: Medicare HMO | Admitting: Urology

## 2021-12-11 DIAGNOSIS — N3941 Urge incontinence: Secondary | ICD-10-CM

## 2021-12-11 DIAGNOSIS — N390 Urinary tract infection, site not specified: Secondary | ICD-10-CM | POA: Diagnosis not present

## 2021-12-11 LAB — MICROSCOPIC EXAMINATION: Renal Epithel, UA: NONE SEEN /hpf

## 2021-12-11 LAB — URINALYSIS, ROUTINE W REFLEX MICROSCOPIC
Bilirubin, UA: NEGATIVE
Glucose, UA: NEGATIVE
Ketones, UA: NEGATIVE
Leukocytes,UA: NEGATIVE
Nitrite, UA: NEGATIVE
Protein,UA: NEGATIVE
Specific Gravity, UA: 1.02 (ref 1.005–1.030)
Urobilinogen, Ur: 0.2 mg/dL (ref 0.2–1.0)
pH, UA: 7 (ref 5.0–7.5)

## 2021-12-11 MED ORDER — NITROFURANTOIN MONOHYD MACRO 100 MG PO CAPS: 100.0000 mg | ORAL_CAPSULE | Freq: Every day | ORAL | 2 refills | Status: DC

## 2021-12-11 MED ORDER — CIPROFLOXACIN HCL 500 MG PO TABS
500.0000 mg | ORAL_TABLET | Freq: Once | ORAL | Status: AC
  Administered 2021-12-11: 500 mg via ORAL

## 2021-12-11 MED ORDER — MIRABEGRON ER 50 MG PO TB24: 50.0000 mg | ORAL_TABLET | Freq: Every day | ORAL | 0 refills | Status: DC

## 2021-12-11 NOTE — Progress Notes (Signed)
? ?Assessment: ?1. Recurrent UTI   ?2. Urge incontinence   ? ? ?Plan: ?Continue methods to reduce the risk of recurrent UTIs including increased fluid intake, timed and double voiding, daily cranberry supplement, and daily probiotic reviewed. ?Continue Myrbetriq 50 mg daily.   ?Continue daily Macrobid for UTI prevention ?I discussed further evaluation of her incontinence with urodynamics at [facility].  She would like to continue medical therapy for now. ?Return to office in 6 weeks. ? ? ?Chief Complaint:  ?Chief Complaint  ?Patient presents with  ? Recurrent UTI  ? Urinary Incontinence  ? ? ?History of Present Illness: ? ?Terry Allen is a 81 y.o. year old female who is seen for further evaluation of UTI's and urge incontinence. ?She has a history of chronic UTIs and has been followed by Dr. Gaynelle Arabian and Dr. Gloriann Loan at [facility].  She is status post a Pinnacle bladder sling and transobturator urethral sling by Dr. Gaetano Net in 2009.  She was treated for vaginal mesh extrusion by Dr. Burke Keels in August 2011.  She has had longstanding symptoms of frequency, urgency, and urge incontinence.  She has previously been managed with medical therapy for her symptoms.  She also has a history of recurrent UTIs.  She was previously treated with daily antibiotic prophylaxis but has not been on this for some time.  At her last visit in February 2022, she did not have evidence of UTI.  Renal ultrasound from 1/22 did not demonstrate any renal or bladder abnormalities.  She was being managed with vaginal Estrace cream and a daily probiotic.  She reports recurrent UTIs since June 2022.  Typical symptoms include low back pain, pelvic pain, nausea, and some dysuria.  She continues with her baseline symptoms of frequency, urgency, nocturia x2, and urge incontinence.  No flank pain or gross hematuria.  She was last treated for a UTI approximately 1 week ago.   ?Urine culture from 9/22 grew 30 K colonies of E. coli.    ?At her visit in 2/23, she reported suprapubic discomfort, frequency, burning sensation in the bladder area, urgency, and incontinence. ?She reported symptoms of fecal incontinence. ? ?Urine culture from 10/12/2021 grew >100 K E. coli.  She was treated with Macrobid and started on daily Macrobid. ?She was seen on 12/03/2021 with UTI symptoms.  A urine culture had been obtained through her PCP.  No results available.  She was being treated with Bactrim. ?She had run out of Myrbetriq and this was resumed. ? ?She presents today for cystoscopy and pelvic exam. ?She continues on daily Macrodantin and Myrbetriq.  She has urinary frequency, urgency, and continued urge incontinence.  She does feel like the Myrbetriq has improved her symptoms.  No side effects. ? ?Portions of the above documentation were copied from a prior visit for review purposes only. ? ? ?Past Medical History:  ?Past Medical History:  ?Diagnosis Date  ? Acid reflux   ? Patient reports Barretts  ? Bulging lumbar disc   ? High cholesterol   ? Hypothyroidism   ? Nerve damage   ? Osteopenia   ? Pelvic pain in female   ? due to bladder sling  ? ? ?Past Surgical History:  ?Past Surgical History:  ?Procedure Laterality Date  ? APPENDECTOMY  1960  ? BIOPSY  11/03/2019  ? Procedure: BIOPSY;  Surgeon: Rogene Houston, MD;  [facility];  Service: Endoscopy;;  ? BLADDER SUSPENSION    ? CATARACT EXTRACTION W/PHACO Right 06/07/2019  ?  Procedure: CATARACT EXTRACTION PHACO AND INTRAOCULAR LENS PLACEMENT (IOC);  Surgeon: Baruch Goldmann, MD;  Location: [facility];  Service: Ophthalmology;  Laterality: Right;  CDE: 10.87  ? CATARACT EXTRACTION W/PHACO Left 06/21/2019  ? Procedure: CATARACT EXTRACTION PHACO AND INTRAOCULAR LENS PLACEMENT (IOC);  Surgeon: Baruch Goldmann, MD;  Location: [facility];  Service: Ophthalmology;  Laterality: Left;  CDE: 11.59  ? COLONOSCOPY N/A 11/03/2019  ? Procedure: COLONOSCOPY;  Surgeon: Rogene Houston, MD;  [facility];   Service: Endoscopy;  Laterality: N/A;  1245  ? ESOPHAGEAL DILATION N/A 05/27/2018  ? Procedure: ESOPHAGEAL DILATION;  Surgeon: Rogene Houston, MD;  [facility];  Service: Endoscopy;  Laterality: N/A;  ? ESOPHAGEAL DILATION N/A 02/12/2019  ? Procedure: ESOPHAGEAL DILATION;  Surgeon: Rogene Houston, MD;  [facility];  Service: Endoscopy;  Laterality: N/A;  ? ESOPHAGOGASTRODUODENOSCOPY N/A 12/21/2014  ? Procedure: ESOPHAGOGASTRODUODENOSCOPY (EGD);  Surgeon: Rogene Houston, MD;  [facility];  Service: Endoscopy;  Laterality: N/A;  210  ? ESOPHAGOGASTRODUODENOSCOPY N/A 06/16/2015  ? Procedure: ESOPHAGOGASTRODUODENOSCOPY (EGD);  Surgeon: Rogene Houston, MD;  [facility];  Service: Endoscopy;  Laterality: N/A;  1250  ? ESOPHAGOGASTRODUODENOSCOPY N/A 05/27/2018  ? Procedure: ESOPHAGOGASTRODUODENOSCOPY (EGD);  Surgeon: Rogene Houston, MD;  [facility];  Service: Endoscopy;  Laterality: N/A;  7:30  ? ESOPHAGOGASTRODUODENOSCOPY N/A 02/12/2019  ? Procedure: ESOPHAGOGASTRODUODENOSCOPY (EGD);  Surgeon: Rogene Houston, MD;  [facility];  Service: Endoscopy;  Laterality: N/A;  ? ? ?Allergies:  ?No Known Allergies ? ?Family History:  ?Family History  ?Problem Relation Age of Onset  ? Breast cancer Mother   ? Heart disease Mother   ? Diabetes Mother   ? Cancer Mother   ?     breast  ? Stroke Mother   ? Hypertension Mother   ? Lung cancer Father   ? Stroke Father   ? Cancer Father   ?     oat cell cancer/lung  ? Kidney cancer Brother   ? Cancer Brother   ?     renal cell cancer  ? Healthy Brother   ? Diabetes Son   ? ? ?Social History:  ?Social History  ? ?Tobacco Use  ? Smoking status: Never  ? Smokeless tobacco: Never  ?Vaping Use  ? Vaping Use: Never used  ?Substance Use Topics  ? Alcohol use: No  ?  Alcohol/week: 0.0 standard drinks  ? Drug use: No  ? ? ?ROS: ?Constitutional:  Negative for fever, chills, weight loss ?CV: Negative for chest pain, previous MI,  hypertension ?Respiratory:  Negative for shortness of breath, wheezing, sleep apnea, frequent cough ?GI:  Negative for nausea, vomiting, bloody stool, GERD ? ?Physical exam: ?GENERAL APPEARANCE:  Well appearing, well developed, well nourished, NAD ?HEENT:  Atraumatic, normocephalic, oropharynx clear ?NECK:  Supple without lymphadenopathy or thyromegaly ?ABDOMEN:  Soft, non-tender, no masses ?EXTREMITIES:  Moves all extremities well, without clubbing, cyanosis, or edema ?NEUROLOGIC:  Alert and oriented x 3, normal gait, CN II-XII grossly intact ?MENTAL STATUS:  appropriate ?BACK:  Non-tender to palpation, No CVAT ?SKIN:  Warm, dry, and intact ?GU: ?Urethra: well supported, no leakage with cough or valsalva ?Vagina: atrophic changes, grade 2-3 cystocele, no mesh extrusion ? ? ?Results: ?U/A: 0-5 WBC< 0-2 RBC, mod bacteria ? ?Procedure:  Flexible Cystourethroscopy ? ?Pre-operative Diagnosis:  UTI, urinary incontinence ? ?Post-operative Diagnosis:  UTI, urinary incontinence ? ?Anesthesia:  local with lidocaine jelly ? ?Surgical  Narrative: ? ?After appropriate informed consent was obtained, the patient was prepped and draped in the usual sterile fashion in the supine position.  The patient was correctly identified and the proper procedure delineated prior to proceeding.  Sterile lidocaine gel was instilled in the urethra. ?The flexible cystoscope was introduced without difficulty. ? ?Findings: ? ?Urethra: Normal ? ?Bladder:  squamous metaplasia, chronic cystitis ? ?Ureteral orifices: normal ? ?Additional findings:  bladder empty at beginning of exam ? ?Saline bladder wash for cytology was not performed.   ? ?The cystoscope was then removed.  The patient tolerated the procedure well. ? ? ?

## 2021-12-19 ENCOUNTER — Other Ambulatory Visit: Payer: Self-pay

## 2021-12-19 ENCOUNTER — Emergency Department (HOSPITAL_COMMUNITY): Payer: Medicare HMO

## 2021-12-19 ENCOUNTER — Encounter (HOSPITAL_COMMUNITY): Payer: Self-pay | Admitting: *Deleted

## 2021-12-19 ENCOUNTER — Observation Stay (HOSPITAL_COMMUNITY)
Admission: EM | Admit: 2021-12-19 | Discharge: 2021-12-21 | Disposition: A | Discharge status: Home / Self Care | Payer: Medicare HMO | Attending: Internal Medicine | Admitting: Internal Medicine

## 2021-12-19 DIAGNOSIS — E7800 Pure hypercholesterolemia, unspecified: Secondary | ICD-10-CM | POA: Diagnosis not present

## 2021-12-19 DIAGNOSIS — E782 Mixed hyperlipidemia: Secondary | ICD-10-CM | POA: Diagnosis not present

## 2021-12-19 DIAGNOSIS — R079 Chest pain, unspecified: Secondary | ICD-10-CM | POA: Diagnosis present

## 2021-12-19 DIAGNOSIS — K219 Gastro-esophageal reflux disease without esophagitis: Secondary | ICD-10-CM | POA: Diagnosis not present

## 2021-12-19 DIAGNOSIS — I700 Atherosclerosis of aorta: Secondary | ICD-10-CM | POA: Diagnosis not present

## 2021-12-19 DIAGNOSIS — J9811 Atelectasis: Secondary | ICD-10-CM | POA: Diagnosis not present

## 2021-12-19 DIAGNOSIS — E039 Hypothyroidism, unspecified: Secondary | ICD-10-CM | POA: Diagnosis not present

## 2021-12-19 DIAGNOSIS — Z79899 Other long term (current) drug therapy: Secondary | ICD-10-CM | POA: Insufficient documentation

## 2021-12-19 DIAGNOSIS — R072 Precordial pain: Secondary | ICD-10-CM

## 2021-12-19 DIAGNOSIS — R0789 Other chest pain: Secondary | ICD-10-CM | POA: Diagnosis not present

## 2021-12-19 LAB — CBC
HCT: 36.5 % (ref 36.0–46.0)
Hemoglobin: 11.8 g/dL — ABNORMAL LOW (ref 12.0–15.0)
MCH: 31.9 pg (ref 26.0–34.0)
MCHC: 32.3 g/dL (ref 30.0–36.0)
MCV: 98.6 fL (ref 80.0–100.0)
Platelets: 186 10*3/uL (ref 150–400)
RBC: 3.7 MIL/uL — ABNORMAL LOW (ref 3.87–5.11)
RDW: 12.4 % (ref 11.5–15.5)
WBC: 4.4 10*3/uL (ref 4.0–10.5)
nRBC: 0 % (ref 0.0–0.2)

## 2021-12-19 LAB — BASIC METABOLIC PANEL
Anion gap: 4 — ABNORMAL LOW (ref 5–15)
BUN: 15 mg/dL (ref 8–23)
CO2: 30 mmol/L (ref 22–32)
Calcium: 9.2 mg/dL (ref 8.9–10.3)
Chloride: 102 mmol/L (ref 98–111)
Creatinine, Ser: 1.01 mg/dL — ABNORMAL HIGH (ref 0.44–1.00)
GFR, Estimated: 57 mL/min — ABNORMAL LOW (ref >60)
Glucose, Bld: 113 mg/dL — ABNORMAL HIGH (ref 70–99)
Potassium: 3.8 mmol/L (ref 3.5–5.1)
Sodium: 136 mmol/L (ref 135–145)

## 2021-12-19 LAB — TROPONIN I (HIGH SENSITIVITY)
Troponin I (High Sensitivity): 2 ng/L (ref <18)
Troponin I (High Sensitivity): <2 ng/L (ref <18)

## 2021-12-19 LAB — TSH: TSH: 9.499 u[IU]/mL — ABNORMAL HIGH (ref 0.350–4.500)

## 2021-12-19 MED ORDER — HEPARIN SODIUM (PORCINE) 5000 UNIT/ML IJ SOLN
5000.0000 [IU] | Freq: Three times a day (TID) | INTRAMUSCULAR | Status: DC
  Administered 2021-12-19 – 2021-12-21 (×5): 5000 [IU] via SUBCUTANEOUS
  Filled 2021-12-19 (×5): qty 1

## 2021-12-19 MED ORDER — ACETAMINOPHEN 325 MG PO TABS
650.0000 mg | ORAL_TABLET | Freq: Four times a day (QID) | ORAL | Status: DC | PRN
Start: 2021-12-19 — End: 2021-12-21
  Administered 2021-12-19 – 2021-12-20 (×3): 650 mg via ORAL
  Filled 2021-12-19 (×3): qty 2

## 2021-12-19 MED ORDER — FAMOTIDINE 20 MG PO TABS
20.0000 mg | ORAL_TABLET | Freq: Two times a day (BID) | ORAL | Status: DC
Start: 2021-12-19 — End: 2021-12-21
  Administered 2021-12-19 – 2021-12-21 (×4): 20 mg via ORAL
  Filled 2021-12-19 (×4): qty 1

## 2021-12-19 MED ORDER — SODIUM CHLORIDE 0.9% FLUSH
3.0000 mL | Freq: Two times a day (BID) | INTRAVENOUS | Status: DC
  Administered 2021-12-19 – 2021-12-20 (×3): 3 mL via INTRAVENOUS

## 2021-12-19 MED ORDER — SODIUM CHLORIDE 0.9% FLUSH: 3.0000 mL | INTRAVENOUS | Status: DC | PRN

## 2021-12-19 MED ORDER — TRAZODONE HCL 50 MG PO TABS: 50.0000 mg | ORAL_TABLET | Freq: Every evening | ORAL | Status: DC | PRN

## 2021-12-19 MED ORDER — SODIUM CHLORIDE 0.9 % IV SOLN: 250.0000 mL | INTRAVENOUS | Status: DC | PRN

## 2021-12-19 MED ORDER — ACETAMINOPHEN 650 MG RE SUPP: 650.0000 mg | Freq: Four times a day (QID) | RECTAL | Status: DC | PRN

## 2021-12-19 MED ORDER — ATORVASTATIN CALCIUM 40 MG PO TABS
40.0000 mg | ORAL_TABLET | Freq: Every day | ORAL | Status: DC
  Administered 2021-12-19 – 2021-12-20 (×2): 40 mg via ORAL
  Filled 2021-12-19 (×2): qty 1

## 2021-12-19 MED ORDER — NITROGLYCERIN 0.4 MG SL SUBL: 0.4000 mg | SUBLINGUAL_TABLET | SUBLINGUAL | Status: DC | PRN

## 2021-12-19 MED ORDER — ONDANSETRON HCL 4 MG PO TABS
4.0000 mg | ORAL_TABLET | Freq: Four times a day (QID) | ORAL | Status: DC | PRN
  Administered 2021-12-20: 4 mg via ORAL
  Filled 2021-12-19: qty 1

## 2021-12-19 MED ORDER — POLYETHYLENE GLYCOL 3350 17 G PO PACK: 17.0000 g | PACK | Freq: Every day | ORAL | Status: DC | PRN

## 2021-12-19 MED ORDER — IOHEXOL 350 MG/ML SOLN
100.0000 mL | Freq: Once | INTRAVENOUS | Status: AC | PRN
  Administered 2021-12-19: 75 mL via INTRAVENOUS

## 2021-12-19 MED ORDER — LEVOTHYROXINE SODIUM 50 MCG PO TABS
50.0000 ug | ORAL_TABLET | Freq: Every day | ORAL | Status: DC
  Administered 2021-12-20 – 2021-12-21 (×2): 50 ug via ORAL
  Filled 2021-12-19 (×2): qty 1

## 2021-12-19 MED ORDER — ALUM & MAG HYDROXIDE-SIMETH 200-200-20 MG/5ML PO SUSP
30.0000 mL | Freq: Once | ORAL | Status: AC
  Administered 2021-12-19: 30 mL via ORAL
  Filled 2021-12-19: qty 30

## 2021-12-19 MED ORDER — MIRABEGRON ER 25 MG PO TB24
50.0000 mg | ORAL_TABLET | Freq: Every day | ORAL | Status: DC
  Administered 2021-12-19 – 2021-12-21 (×3): 50 mg via ORAL
  Filled 2021-12-19 (×3): qty 2

## 2021-12-19 MED ORDER — LIDOCAINE VISCOUS HCL 2 % MT SOLN
15.0000 mL | Freq: Once | OROMUCOSAL | Status: AC
  Administered 2021-12-19: 15 mL via ORAL
  Filled 2021-12-19: qty 15

## 2021-12-19 MED ORDER — GABAPENTIN 300 MG PO CAPS
300.0000 mg | ORAL_CAPSULE | Freq: Three times a day (TID) | ORAL | Status: DC
  Administered 2021-12-19 – 2021-12-21 (×5): 300 mg via ORAL
  Filled 2021-12-19 (×5): qty 1

## 2021-12-19 MED ORDER — AMITRIPTYLINE HCL 25 MG PO TABS
50.0000 mg | ORAL_TABLET | Freq: Every day | ORAL | Status: DC
  Administered 2021-12-19 – 2021-12-20 (×2): 50 mg via ORAL
  Filled 2021-12-19 (×2): qty 2

## 2021-12-19 MED ORDER — BISACODYL 10 MG RE SUPP: 10.0000 mg | Freq: Every day | RECTAL | Status: DC | PRN

## 2021-12-19 MED ORDER — SODIUM CHLORIDE 0.9% FLUSH
3.0000 mL | Freq: Two times a day (BID) | INTRAVENOUS | Status: DC
  Administered 2021-12-20 (×2): 3 mL via INTRAVENOUS

## 2021-12-19 MED ORDER — ONDANSETRON HCL 4 MG/2ML IJ SOLN
4.0000 mg | Freq: Four times a day (QID) | INTRAMUSCULAR | Status: DC | PRN
Start: 2021-12-19 — End: 2021-12-21

## 2021-12-19 NOTE — Consult Note (Signed)
? 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 ?Doesn't want to go to [facility] ?Trend troponin negative X2 ? ?Will give PRN Nitro ?Echo is ordered ? ?Based on results, will send to [facility] or plan for outpatient work up ? ? ?Cardiology Consult Note:  ? ?Patient ID: Terry Allen ?MRN: [id number]; DOB: 1941/07/25  ? ?Admission date: 12/19/2021 ? ?PCP:  [name] ?  ?[facility] Providers ?Cardiologist:  None      ? ? ?Chief Complaint:  Chest pain ?Patient Profile:  ? ?Terry Allen is a 81 y.o. female with HLD who is being seen 12/19/2021 for the evaluation of chest pain. ? ?History of Present Illness:  ? ?Ms. Hughson notes that new chest discomfort  Notes that that has had stomach and abdominal pain in the best.  Noted that she tries to use as few pain pills as possible.  For the past three days had had left arm pain.  She has has neck pain and burning chest pain.  She takes ~ 4 Advil and 2 Tylenol for this pain: it is unclear whether the pain occurs with palpitations (sometimes it does).  Worsens with stress.  Worsens with blood pressure cuff. ? ? ?Patient is fairly active at baseline: has land and two donkeys and does not have exertional chest discomfort. No shortness of breath, DOE .  No PND or orthopnea.  No bendopnea, weight gain, leg swelling , or abdominal swelling.  No syncope or near syncope .  Inpatient evaluation because of of escalation symptoms . ? ?She has two parents of had Mis (Mother had CABG) in their 5s.  She does not smoke.  No prior symptoms. ? ?Notes no palpitations or funny heart beats.    ? ?Patient reports prior cardiac testing including  ?New RBBB ?CTPE with scattered LAD and LCX calcifications ?Aortic atherosclerosis. ?In the [place] patient received medications Maalox with some improvement. ? ?Cardiology called for evaluation. ? ? ? ?Past Medical History:  ?Diagnosis Date  ? Acid reflux   ? Patient reports Barretts  ?  Bulging lumbar disc   ? High cholesterol   ? Hypothyroidism   ? Nerve damage   ? Osteopenia   ? Pelvic pain in female   ? due to bladder sling  ? ? ?Past Surgical History:  ?Procedure Laterality Date  ? APPENDECTOMY  1960  ? BIOPSY  11/03/2019  ? Procedure: BIOPSY;  Surgeon: Rogene Houston, MD;  [facility];  Service: Endoscopy;;  ? BLADDER SUSPENSION    ? CATARACT EXTRACTION W/PHACO Right 06/07/2019  ? Procedure: CATARACT EXTRACTION PHACO AND INTRAOCULAR LENS PLACEMENT (IOC);  Surgeon: Baruch Goldmann, MD;  [facility];  Service: Ophthalmology;  Laterality: Right;  CDE: 10.87  ? CATARACT EXTRACTION W/PHACO Left 06/21/2019  ? Procedure: CATARACT EXTRACTION PHACO AND INTRAOCULAR LENS PLACEMENT (IOC);  Surgeon: Baruch Goldmann, MD;  [facility];  Service: Ophthalmology;  Laterality: Left;  CDE: 11.59  ? COLONOSCOPY N/A 11/03/2019  ? Procedure: COLONOSCOPY;  Surgeon: Rogene Houston, MD;  [facility];  Service: Endoscopy;  Laterality: N/A;  1245  ? ESOPHAGEAL DILATION N/A 05/27/2018  ? Procedure: ESOPHAGEAL DILATION;  Surgeon: Rogene Houston, MD;  [facility];  Service: Endoscopy;  Laterality: N/A;  ? ESOPHAGEAL DILATION N/A 02/12/2019  ? Procedure: ESOPHAGEAL DILATION;  Surgeon: Rogene Houston, MD;  [facility];  Service: Endoscopy;  Laterality: N/A;  ? ESOPHAGOGASTRODUODENOSCOPY N/A 12/21/2014  ? Procedure: ESOPHAGOGASTRODUODENOSCOPY (EGD);  Surgeon: Rogene Houston, MD;  [facility];  Service: Endoscopy;  Laterality: N/A;  210  ? ESOPHAGOGASTRODUODENOSCOPY N/A 06/16/2015  ? Procedure: ESOPHAGOGASTRODUODENOSCOPY (EGD);  Surgeon: Rogene Houston, MD;  [facility];  Service: Endoscopy;  Laterality: N/A;  1250  ? ESOPHAGOGASTRODUODENOSCOPY N/A 05/27/2018  ? Procedure: ESOPHAGOGASTRODUODENOSCOPY (EGD);  Surgeon: Rogene Houston, MD;  [facility];  Service: Endoscopy;  Laterality: N/A;  7:30  ? ESOPHAGOGASTRODUODENOSCOPY N/A  02/12/2019  ? Procedure: ESOPHAGOGASTRODUODENOSCOPY (EGD);  Surgeon: Rogene Houston, MD;  [facility];  Service: Endoscopy;  Laterality: N/A;  ?  ? ?Medications Prior to Admission: ?Prior to Admission medications   ?Medication Sig Start Date End Date Taking? Authorizing Provider  ?acetaminophen (TYLENOL) 500 MG tablet Take 500 mg by mouth every 6 (six) hours as needed for moderate pain.    Yes [provider]  ?amitriptyline (ELAVIL) 50 MG tablet Take 50 mg by mouth at bedtime.  04/12/18  Yes [provider]  ?atorvastatin (LIPITOR) 40 MG tablet Take 40 mg by mouth at bedtime.    Yes [provider]  ?Calcium Carb-Cholecalciferol (CALCIUM 600 + D PO) Take 1 tablet by mouth 2 (two) times a day.   Yes [provider]  ?Caraway Oil-Levomenthol (FDGARD PO) Take 1 tablet by mouth daily as needed (acid reflux).    Yes [provider]  ?famotidine (PEPCID) 20 MG tablet Take 1 tablet (20 mg total) by mouth 2 (two) times daily. 09/15/19  Yes Laurine Blazer B, PA-C  ?gabapentin (NEURONTIN) 300 MG capsule Take 2 capsules (600 mg total) by mouth 3 (three) times daily. ?Patient taking differently: Take 300 mg by mouth 3 (three) times daily. 10/23/15  Yes Susy Frizzle, MD  ?levothyroxine (SYNTHROID) 50 MCG tablet Take 50 mcg by mouth See admin instructions.   Yes [provider]  ?mirabegron ER (MYRBETRIQ) 50 MG TB24 tablet Take 1 tablet (50 mg total) by mouth daily. 12/11/21  Yes Stoneking, Reece Leader., MD  ?nitrofurantoin, macrocrystal-monohydrate, (MACROBID) 100 MG capsule Take 1 capsule (100 mg total) by mouth daily. 12/11/21  Yes Stoneking, Reece Leader., MD  ?ondansetron (ZOFRAN) 4 MG tablet Take 4 mg by mouth every 8 (eight) hours as needed for nausea or vomiting.  ?Patient not taking: Reported on 12/19/2021 05/31/19   [provider]  ?  ? ?Allergies:   No Known Allergies ? ?Social History:   ?Social History  ? ?Socioeconomic History  ? Marital status:  Divorced  ?  Spouse name: Not on file  ? Number of children: Not on file  ? Years of education: Not on file  ? Highest education level: Not on file  ?Occupational History  ? Not on file  ?Tobacco Use  ? Smoking status: Never  ? Smokeless tobacco: Never  ?Vaping Use  ? Vaping Use: Never used  ?Substance and Sexual Activity  ? Alcohol use: No  ?  Alcohol/week: 0.0 standard drinks  ? Drug use: No  ? Sexual activity: Never  ?Other Topics Concern  ? Not on file  ?Social History Narrative  ? Not on file  ? ?Social Determinants of Health  ? ?Financial Resource Strain: Not on file  ?Food Insecurity: Not on file  ?Transportation Needs: Not on file  ?Physical Activity: Not on file  ?Stress: Not on file  ?Social Connections: Not on file  ?Intimate Partner Violence: Not on file  ?  ?  Family History:   ?The patient's family history includes Breast cancer in her mother; Cancer in her brother, father, and mother; Diabetes in her mother and son; Healthy in her brother; Heart disease in her mother; Hypertension in her mother; Kidney cancer in her brother; Lung cancer in her father; Stroke in her father and mother.   ? ?ROS:  ?Please see the history of present illness.  ?All other ROS reviewed and negative.    ? ?Physical Exam/Data:  ? ?Vitals:  ? 12/19/21 1430 12/19/21 1500 12/19/21 1530 12/19/21 1600  ?BP: (!) 117/91 128/65 135/71 115/60  ?Pulse: 76 68 67 75  ?Resp: '18 17 16 17  '$ ?Temp:      ?TempSrc:      ?SpO2: 100% 100% 97% 91%  ?Weight:      ?Height:      ? ?No intake or output data in the 24 hours ending 12/19/21 1741 ? ?  12/19/2021  ? 11:59 AM 12/03/2021  ?  2:09 PM 10/12/2021  ? 12:20 PM  ?Last 3 Weights  ?Weight (lbs) 109 lb 15.8 oz 110 lb 106 lb  ?Weight (kg) 49.89 kg 49.896 kg 48.081 kg  ?   ?Body mass index is 20.12 kg/m?.  ?General:  Well nourished, well developed, elderly female in no acute distress ?HEENT: normal ?Neck: no JVD ?Vascular: No carotid bruits; Distal pulses 2+ bilaterally   ?Cardiac:  normal S1, S2; RRR; no  murmur  ?Lungs:  clear to auscultation bilaterally, no wheezing, rhonchi or rales  ?Abd: soft, nontender, no hepatomegaly  ?Ext: no edema ?Musculoskeletal:  No deformities, BUE and BLE strength normal and equal ?

## 2021-12-19 NOTE — ED Triage Notes (Signed)
Pt c/o chest pain with the pain radiating down her left arm and up to her neck ?

## 2021-12-19 NOTE — H&P (Signed)
?                                                                                           ? ? ? ? Patient Demographics:  ? ? ?Terry Allen, is a 81 y.o. female  MRN: [id number]   DOB - 04-03-1941 ? ?Admit Date - 12/19/2021 ? ?Outpatient Primary MD for the patient is [name] ? ? Assessment & Plan:  ? ?Assessment and Plan: ? ?1)Atypical Chest Pain-  Cardiovascular risk factors include family history of premature CAD and patient's mother, advanced age, status post menopause, HLD ?-- Place in Observation status on  telemetry monitored unit,  ?-Patient has now ruled out already for ACS by cardiac enzymes and EKG  ?-Cardiologist to decide on further cardiovascular risk stratification . ???Stress test is needed in am Versus other diagnostic modalities.   Give aspirin, ?-Check A1c, TSH and fasting lipid profile ?-Check echocardiogram ? ?2)Hypothyroidism--continue levothyroxine, check TSH as above ? ?3)GERD--continue Pepcid ? ?Disposition/Need for in-Hospital Stay- patient unable to be discharged at this time due to --awaiting further cardiovascular risk stratification with possible stress test versus other modalities ?-Echo pending ? ?Dispo: The patient is from: Home ?             Anticipated d/c is to: Home ?             Anticipated d/c date is: 1 day ?             Patient currently is not medically stable to d/c. ?Barriers: Not Clinically Stable-  ? ? ?With History of - ?Reviewed by me ? ?Past Medical History:  ?Diagnosis Date  ? Acid reflux   ? Patient reports Barretts  ? Bulging lumbar disc   ? High cholesterol   ? Hypothyroidism   ? Nerve damage   ? Osteopenia   ? Pelvic pain in female   ? due to bladder sling  ?   ? ?Past Surgical History:  ?Procedure Laterality Date  ? APPENDECTOMY  1960  ? BIOPSY  11/03/2019  ? Procedure: BIOPSY;  Surgeon: Rogene Houston, MD;  [facility];  Service: Endoscopy;;  ?  BLADDER SUSPENSION    ? CATARACT EXTRACTION W/PHACO Right 06/07/2019  ? Procedure: CATARACT EXTRACTION PHACO AND INTRAOCULAR LENS PLACEMENT (IOC);  Surgeon: Baruch Goldmann, MD;  [facility];  Service: Ophthalmology;  Laterality: Right;  CDE: 10.87  ? CATARACT EXTRACTION W/PHACO Left 06/21/2019  ? Procedure: CATARACT EXTRACTION PHACO AND INTRAOCULAR LENS PLACEMENT (IOC);  Surgeon: Baruch Goldmann, MD;  [facility]: [facility];  Service: Ophthalmology;  Laterality: Left;  CDE: 11.59  ? COLONOSCOPY N/A 11/03/2019  ? Procedure: COLONOSCOPY;  Surgeon: Rogene Houston, MD;  [facility];  Service: Endoscopy;  Laterality: N/A;  1245  ? ESOPHAGEAL DILATION N/A 05/27/2018  ? Procedure: ESOPHAGEAL DILATION;  Surgeon: Rogene Houston, MD;  [facility];  Service: Endoscopy;  Laterality: N/A;  ? ESOPHAGEAL DILATION N/A 02/12/2019  ? Procedure: ESOPHAGEAL DILATION;  Surgeon: Rogene Houston, MD;  [facility];  Service: Endoscopy;  Laterality: N/A;  ? ESOPHAGOGASTRODUODENOSCOPY N/A  12/21/2014  ? Procedure: ESOPHAGOGASTRODUODENOSCOPY (EGD);  Surgeon: Rogene Houston, MD;  [facility];  Service: Endoscopy;  Laterality: N/A;  210  ? ESOPHAGOGASTRODUODENOSCOPY N/A 06/16/2015  ? Procedure: ESOPHAGOGASTRODUODENOSCOPY (EGD);  Surgeon: Rogene Houston, MD;  [facility];  Service: Endoscopy;  Laterality: N/A;  1250  ? ESOPHAGOGASTRODUODENOSCOPY N/A 05/27/2018  ? Procedure: ESOPHAGOGASTRODUODENOSCOPY (EGD);  Surgeon: Rogene Houston, MD;  [facility];  Service: Endoscopy;  Laterality: N/A;  7:30  ? ESOPHAGOGASTRODUODENOSCOPY N/A 02/12/2019  ? Procedure: ESOPHAGOGASTRODUODENOSCOPY (EGD);  Surgeon: Rogene Houston, MD;  [facility];  Service: Endoscopy;  Laterality: N/A;  ? ? ?Chief Complaint  ?Patient presents with  ? Chest Pain  ?  ? ? HPI:  ? ? Terry Allen  is a 81 y.o. female non-smoker with past medical history relevant for HLD, GERD, hypothyroidism and  overactive bladder who presents to the ED with intermittent chest pains for the last 3 days at times associated with palpitations and dizziness ?-Patient does not report exertional component ?-Left-sided chest pain radiates to his left arm and neck from time to time, at times it is epigastric ?-No leg pains no leg swelling no pleuritic symptoms ?- ?No Nausea, Vomiting or Diarrhea ?-No fever  Or chills  ?-No dyspnea at rest no dyspnea on exertion no orthopnea no paroxysmal nocturnal dyspnea, no near-syncope or syncope ?-As per patient mother had CABG around age 29 ?In ED EKG sinus rhythm with new right bundle branch block ?-Initial troponin is 2, repeat less than 2 ?-CBC remarkable for Hgb of 11.8 otherwise normal CBC ?-BMP with a creatinine of 1.01 and random glucose of 113 ?-CTA angio of the chest shows scattered LAD and LCX calcifications, Aortic atherosclerosis. ?-No acute PE ?-Chest x-ray without acute finding ?- ?Patient was seen and evaluated by cardiologist in the ED ? ? Review of systems:  ?  ?In addition to the HPI above,  ? ?A full Review of  Systems was done, all other systems reviewed are negative except as noted above in HPI , . ? ? ? Social History:  ?Reviewed by me ? ?  ?Social History  ? ?Tobacco Use  ? Smoking status: Never  ? Smokeless tobacco: Never  ?Substance Use Topics  ? Alcohol use: No  ?  Alcohol/week: 0.0 standard drinks  ? ? ? ? ? Family History :  ?Reviewed by me ? ?  ?Family History  ?Problem Relation Age of Onset  ? Breast cancer Mother   ? Heart disease Mother   ? Diabetes Mother   ? Cancer Mother   ?     breast  ? Stroke Mother   ? Hypertension Mother   ? Lung cancer Father   ? Stroke Father   ? Cancer Father   ?     oat cell cancer/lung  ? Kidney cancer Brother   ? Cancer Brother   ?     renal cell cancer  ? Healthy Brother   ? Diabetes Son   ? ? ? Home Medications:  ? ?Prior to Admission medications   ?Medication Sig Start Date End Date Taking? Authorizing Provider  ?acetaminophen  (TYLENOL) 500 MG tablet Take 500 mg by mouth every 6 (six) hours as needed for moderate pain.    Yes [provider]  ?amitriptyline (ELAVIL) 50 MG tablet Take 50 mg by mouth at bedtime.  04/12/18  Yes [provider]  ?atorvastatin (LIPITOR) 40 MG tablet Take 40 mg by  mouth at bedtime.    Yes [provider]  ?Calcium Carb-Cholecalciferol (CALCIUM 600 + D PO) Take 1 tablet by mouth 2 (two) times a day.   Yes [provider]  ?Caraway Oil-Levomenthol (FDGARD PO) Take 1 tablet by mouth daily as needed (acid reflux).    Yes [provider]  ?famotidine (PEPCID) 20 MG tablet Take 1 tablet (20 mg total) by mouth 2 (two) times daily. 09/15/19  Yes Laurine Blazer B, PA-C  ?gabapentin (NEURONTIN) 300 MG capsule Take 2 capsules (600 mg total) by mouth 3 (three) times daily. ?Patient taking differently: Take 300 mg by mouth 3 (three) times daily. 10/23/15  Yes Susy Frizzle, MD  ?levothyroxine (SYNTHROID) 50 MCG tablet Take 50 mcg by mouth See admin instructions.   Yes [provider]  ?mirabegron ER (MYRBETRIQ) 50 MG TB24 tablet Take 1 tablet (50 mg total) by mouth daily. 12/11/21  Yes Stoneking, Reece Leader., MD  ?nitrofurantoin, macrocrystal-monohydrate, (MACROBID) 100 MG capsule Take 1 capsule (100 mg total) by mouth daily. 12/11/21  Yes Stoneking, Reece Leader., MD  ?ondansetron (ZOFRAN) 4 MG tablet Take 4 mg by mouth every 8 (eight) hours as needed for nausea or vomiting.  ?Patient not taking: Reported on 12/19/2021 05/31/19   [provider]  ? ? ? Allergies:  ? ? No Known Allergies ? ? Physical Exam:  ? ?Vitals ? ?Blood pressure (!) 149/81, pulse 74, temperature 97.8 ?F (36.6 ?C), temperature source Oral, resp. rate 18, height '5\' 2"'$  (1.575 m), weight 49.9 kg, SpO2 98 %. ? ?Physical Examination: General appearance - alert,  in no distress  ?Mental status - alert, oriented to person, place, and time,  ?Eyes - sclera anicteric ?Neck - supple, no JVD elevation , ?Chest  - clear  to auscultation bilaterally, symmetrical air movement,  ?Heart - S1 and S2 normal, regular  ?Abdomen - soft, nontender, nondistended, +BS ?Neurological - screening mental status exam normal, neck sup

## 2021-12-19 NOTE — ED Provider Notes (Signed)
?[place] ?Provider Note ? ?CSN: [id number] ?Arrival date & time: 12/19/21 1153 ? ?Chief Complaint(s) ?Chest Pain ? ?HPI ?Terry Allen is a 81 y.o. female with PMH Barrett's esophagus, hypothyroidism, recurrent UTI who presents emergency department for evaluation of chest pain.  Patient states that her pain is left-sided, persistent and radiates into the neck and left upper extremity.  No associated shortness of breath, abdominal pain, nausea, vomiting, diaphoresis or other systemic symptoms. ? ? ?Chest Pain ? ?Past Medical History ?Past Medical History:  ?Diagnosis Date  ? Acid reflux   ? Patient reports Barretts  ? Bulging lumbar disc   ? High cholesterol   ? Hypothyroidism   ? Nerve damage   ? Osteopenia   ? Pelvic pain in female   ? due to bladder sling  ? ?Patient Active Problem List  ? Diagnosis Date Noted  ? Recurrent UTI 10/12/2021  ? Urge incontinence 10/12/2021  ? Chronic upper abdominal pain 07/10/2021  ? Colon cancer screening 09/15/2019  ? Esophageal dysphagia 05/20/2018  ? GERD (gastroesophageal reflux disease) 12/13/2014  ? Barrett's esophagus 12/13/2014  ? Hypothyroidism 12/13/2014  ? High cholesterol 12/13/2014  ? ?Home Medication(s) ?Prior to Admission medications   ?Medication Sig Start Date End Date Taking? Authorizing Provider  ?acetaminophen (TYLENOL) 500 MG tablet Take 500 mg by mouth every 6 (six) hours as needed for moderate pain.    Yes [provider]  ?amitriptyline (ELAVIL) 50 MG tablet Take 50 mg by mouth at bedtime.  04/12/18  Yes [provider]  ?atorvastatin (LIPITOR) 40 MG tablet Take 40 mg by mouth at bedtime.    Yes [provider]  ?Calcium Carb-Cholecalciferol (CALCIUM 600 + D PO) Take 1 tablet by mouth 2 (two) times a day.   Yes [provider]  ?[place] Oil-Levomenthol (FDGARD PO) Take 1 tablet by mouth daily as needed (acid reflux).    Yes [provider]  ?famotidine (PEPCID) 20 MG tablet Take 1  tablet (20 mg total) by mouth 2 (two) times daily. 09/15/19  Yes Laurine Blazer B, PA-C  ?gabapentin (NEURONTIN) 300 MG capsule Take 2 capsules (600 mg total) by mouth 3 (three) times daily. ?Patient taking differently: Take 300 mg by mouth 3 (three) times daily. 10/23/15  Yes Susy Frizzle, MD  ?levothyroxine (SYNTHROID) 50 MCG tablet Take 50 mcg by mouth See admin instructions.   Yes [provider]  ?mirabegron ER (MYRBETRIQ) 50 MG TB24 tablet Take 1 tablet (50 mg total) by mouth daily. 12/11/21  Yes Stoneking, Reece Leader., MD  ?nitrofurantoin, macrocrystal-monohydrate, (MACROBID) 100 MG capsule Take 1 capsule (100 mg total) by mouth daily. 12/11/21  Yes Stoneking, Reece Leader., MD  ?ondansetron (ZOFRAN) 4 MG tablet Take 4 mg by mouth every 8 (eight) hours as needed for nausea or vomiting.  ?Patient not taking: Reported on 12/19/2021 05/31/19   [provider]  ?                                                                                                                                  ?  Past Surgical History ?Past Surgical History:  ?Procedure Laterality Date  ? APPENDECTOMY  1960  ? BIOPSY  11/03/2019  ? Procedure: BIOPSY;  Surgeon: Rogene Houston, MD;  [facility];  Service: Endoscopy;;  ? BLADDER SUSPENSION    ? CATARACT EXTRACTION W/PHACO Right 06/07/2019  ? Procedure: CATARACT EXTRACTION PHACO AND INTRAOCULAR LENS PLACEMENT (IOC);  Surgeon: Baruch Goldmann, MD;  Location: [facility];  Service: Ophthalmology;  Laterality: Right;  CDE: 10.87  ? CATARACT EXTRACTION W/PHACO Left 06/21/2019  ? Procedure: CATARACT EXTRACTION PHACO AND INTRAOCULAR LENS PLACEMENT (IOC);  Surgeon: Baruch Goldmann, MD;  Location: [facility];  Service: Ophthalmology;  Laterality: Left;  CDE: 11.59  ? COLONOSCOPY N/A 11/03/2019  ? Procedure: COLONOSCOPY;  Surgeon: Rogene Houston, MD;  Location: [facility];  Service: Endoscopy;  Laterality: N/A;  1245  ? ESOPHAGEAL DILATION N/A 05/27/2018  ? Procedure: ESOPHAGEAL  DILATION;  Surgeon: Rogene Houston, MD;  [facility];  Service: Endoscopy;  Laterality: N/A;  ? ESOPHAGEAL DILATION N/A 02/12/2019  ? Procedure: ESOPHAGEAL DILATION;  Surgeon: Rogene Houston, MD;  [facility];  Service: Endoscopy;  Laterality: N/A;  ? ESOPHAGOGASTRODUODENOSCOPY N/A 12/21/2014  ? Procedure: ESOPHAGOGASTRODUODENOSCOPY (EGD);  Surgeon: Rogene Houston, MD;  [facility];  Service: Endoscopy;  Laterality: N/A;  210  ? ESOPHAGOGASTRODUODENOSCOPY N/A 06/16/2015  ? Procedure: ESOPHAGOGASTRODUODENOSCOPY (EGD);  Surgeon: Rogene Houston, MD;  [facility];  Service: Endoscopy;  Laterality: N/A;  1250  ? ESOPHAGOGASTRODUODENOSCOPY N/A 05/27/2018  ? Procedure: ESOPHAGOGASTRODUODENOSCOPY (EGD);  Surgeon: Rogene Houston, MD;  [facility];  Service: Endoscopy;  Laterality: N/A;  7:30  ? ESOPHAGOGASTRODUODENOSCOPY N/A 02/12/2019  ? Procedure: ESOPHAGOGASTRODUODENOSCOPY (EGD);  Surgeon: Rogene Houston, MD;  [facility];  Service: Endoscopy;  Laterality: N/A;  ? ?Family History ?Family History  ?Problem Relation Age of Onset  ? Breast cancer Mother   ? Heart disease Mother   ? Diabetes Mother   ? Cancer Mother   ?     breast  ? Stroke Mother   ? Hypertension Mother   ? Lung cancer Father   ? Stroke Father   ? Cancer Father   ?     oat cell cancer/lung  ? Kidney cancer Brother   ? Cancer Brother   ?     renal cell cancer  ? Healthy Brother   ? Diabetes Son   ? ? ?Social History ?Social History  ? ?Tobacco Use  ? Smoking status: Never  ? Smokeless tobacco: Never  ?Vaping Use  ? Vaping Use: Never used  ?Substance Use Topics  ? Alcohol use: No  ?  Alcohol/week: 0.0 standard drinks  ? Drug use: No  ? ?Allergies ?Patient has no known allergies. ? ?Review of Systems ?Review of Systems  ?Cardiovascular:  Positive for chest pain.  ? ?Physical Exam ?Vital Signs  ?I have reviewed the triage vital signs ?BP 117/61   Pulse 63   Temp 97.8 ?F (36.6 ?C) (Oral)    Resp 16   Ht '5\' 2"'$  (1.575 m)   Wt 49.9 kg   SpO2 98%   BMI 20.12 kg/m?  ? ?Physical Exam ?Vitals and nursing note reviewed.  ?Constitutional:   ?   General: She is not in acute distress. ?   Appearance: She is well-developed.  ?HENT:  ?   Head: Normocephalic and atraumatic.  ?Eyes:  ?   Conjunctiva/sclera: Conjunctivae normal.  ?Cardiovascular:  ?  Rate and Rhythm: Normal rate and regular rhythm.  ?   Heart sounds: No murmur heard. ?Pulmonary:  ?   Effort: Pulmonary effort is normal. No respiratory distress.  ?   Breath sounds: Normal breath sounds.  ?Abdominal:  ?   Palpations: Abdomen is soft.  ?   Tenderness: There is no abdominal tenderness.  ?Musculoskeletal:     ?   General: No swelling.  ?   Cervical back: Neck supple.  ?Skin: ?   General: Skin is warm and dry.  ?   Capillary Refill: Capillary refill takes less than 2 seconds.  ?Neurological:  ?   Mental Status: She is alert.  ?Psychiatric:     ?   Mood and Affect: Mood normal.  ? ? ?ED Results and Treatments ?Labs ?(all labs ordered are listed, but only abnormal results are displayed) ?Labs Reviewed  ?BASIC METABOLIC PANEL - Abnormal; Notable for the following components:  ?    Result Value  ? Glucose, Bld 113 (*)   ? Creatinine, Ser 1.01 (*)   ? GFR, Estimated 57 (*)   ? Anion gap 4 (*)   ? All other components within normal limits  ?CBC - Abnormal; Notable for the following components:  ? RBC 3.70 (*)   ? Hemoglobin 11.8 (*)   ? All other components within normal limits  ?TROPONIN I (HIGH SENSITIVITY)  ?TROPONIN I (HIGH SENSITIVITY)  ?                                                                                                                       ? ?Radiology ?DG Chest 2 View ? ?Result Date: 12/19/2021 ?CLINICAL DATA:  Chest pain. Pain radiating down left arm and up to neck. EXAM: CHEST - 2 VIEW COMPARISON:  Chest two views 10/06/2019 FINDINGS: Cardiac silhouette and mediastinal contours are within normal limits. There is again moderate  hyperinflation. The lungs are clear. No pleural effusion or pneumothorax. Diffuse decreased bone mineralization. Mild-to-moderate multilevel degenerative disc changes of the mid to upper thoracic spine similar to prior. Mi

## 2021-12-19 NOTE — Progress Notes (Signed)
? ?Progress Note ? ?Patient Name: Terry Allen ?Date of Encounter: 12/20/2021 ? ?[facility] Cardiologist: None  ? ?Subjective  ? ?Continues to have intermittent chest pain. No SOB. States she would like to avoid transfer to [facility] if possible. ? ?Inpatient Medications  ?  ?Scheduled Meds: ? amitriptyline  50 mg Oral QHS  ? atorvastatin  40 mg Oral QHS  ? dextrose      ? famotidine  20 mg Oral BID  ? gabapentin  300 mg Oral TID  ? heparin  5,000 Units Subcutaneous Q8H  ? levothyroxine  50 mcg Oral Q0600  ? mirabegron ER  50 mg Oral Daily  ? sodium chloride flush  3 mL Intravenous Q12H  ? sodium chloride flush  3 mL Intravenous Q12H  ? ?Continuous Infusions: ? sodium chloride    ? ?PRN Meds: ?sodium chloride, acetaminophen **OR** acetaminophen, bisacodyl, nitroGLYCERIN, ondansetron **OR** ondansetron (ZOFRAN) IV, polyethylene glycol, sodium chloride flush, traZODone  ? ?Vital Signs  ?  ?Vitals:  ? 12/19/21 2255 12/20/21 0055 12/20/21 0502 12/20/21 1100  ?BP: 105/79 118/72 (!) 85/48 115/72  ?Pulse: 71 65 64 61  ?Resp:  '14 15 14  '$ ?Temp:  99.1 ?F (37.3 ?C) 98.9 ?F (37.2 ?C) 97.7 ?F (36.5 ?C)  ?TempSrc:    Axillary  ?SpO2: 100% 97% 98% 97%  ?Weight:      ?Height:      ? ? ?Intake/Output Summary (Last 24 hours) at 12/20/2021 1258 ?Last data filed at 12/20/2021 1025 ?Gross per 24 hour  ?Intake 3 ml  ?Output --  ?Net 3 ml  ? ? ?  12/19/2021  ? 11:59 AM 12/03/2021  ?  2:09 PM 10/12/2021  ? 12:20 PM  ?Last 3 Weights  ?Weight (lbs) 109 lb 15.8 oz 110 lb 106 lb  ?Weight (kg) 49.89 kg 49.896 kg 48.081 kg  ?   ? ?Telemetry  ?  ?NSR - Personally Reviewed ? ?ECG  ?  ?ECG 12/19/21 NSR, RBBB - Personally Reviewed ? ?Physical Exam  ? ?GEN: No acute distress.   ?Neck: No JVD ?Cardiac: RRR, no murmurs, rubs, or gallops.  ?Respiratory: Clear to auscultation bilaterally. ?GI: Soft, nontender, non-distended  ?MS: No edema; No deformity. ?Neuro:  Nonfocal  ?Psych: Normal affect  ? ?Labs  ?  ?High Sensitivity Troponin:   ?Recent Labs  ?Lab  12/19/21 ?1250 12/19/21 ?1420  ?TROPONINIHS 2 <2  ?   ?Chemistry ?Recent Labs  ?Lab 12/19/21 ?1250  ?NA 136  ?K 3.8  ?CL 102  ?CO2 30  ?GLUCOSE 113*  ?BUN 15  ?CREATININE 1.01*  ?CALCIUM 9.2  ?GFRNONAA 57*  ?ANIONGAP 4*  ?  ?Lipids  ?Recent Labs  ?Lab 12/20/21 ?7989  ?CHOL 133  ?TRIG 103  ?HDL 51  ?[place] 61  ?CHOLHDL 2.6  ?  ?Hematology ?Recent Labs  ?Lab 12/19/21 ?1250  ?WBC 4.4  ?RBC 3.70*  ?HGB 11.8*  ?HCT 36.5  ?MCV 98.6  ?MCH 31.9  ?MCHC 32.3  ?RDW 12.4  ?PLT 186  ? ?Thyroid  ?Recent Labs  ?Lab 12/19/21 ?1250  ?TSH 9.499*  ?  ?BNPNo results for input(s): BNP, PROBNP in the last 168 hours.  ?DDimer No results for input(s): DDIMER in the last 168 hours.  ? ?Radiology  ?  ?DG Chest 2 View ? ?Result Date: 12/19/2021 ?CLINICAL DATA:  Chest pain. Pain radiating down left arm and up to neck. EXAM: CHEST - 2 VIEW COMPARISON:  Chest two views 10/06/2019 FINDINGS: Cardiac silhouette and mediastinal contours are within  normal limits. There is again moderate hyperinflation. The lungs are clear. No pleural effusion or pneumothorax. Diffuse decreased bone mineralization. Mild-to-moderate multilevel degenerative disc changes of the mid to upper thoracic spine similar to prior. Mild dextrocurvature of the mid to upper thoracic spine and mild levocurvature of the thoracolumbar junction. IMPRESSION: No active cardiopulmonary disease. Electronically Signed   By: Yvonne Kendall M.D.   On: 12/19/2021 12:27  ? ?CT Angio Chest PE W and/or Wo Contrast ? ?Result Date: 12/19/2021 ?CLINICAL DATA:  LEFT side chest pain radiating down LEFT arm, high clinical suspicion of pulmonary embolism. EXAM: CT ANGIOGRAPHY CHEST WITH CONTRAST TECHNIQUE: Multidetector CT imaging of the chest was performed using the standard protocol during bolus administration of intravenous contrast. Multiplanar CT image reconstructions and MIPs were obtained to evaluate the vascular anatomy. RADIATION DOSE REDUCTION: This exam was performed according to the  departmental dose-optimization program which includes automated exposure control, adjustment of the mA and/or kV according to patient size and/or use of iterative reconstruction technique. CONTRAST:  46m OMNIPAQUE IOHEXOL 350 MG/ML SOLN IV COMPARISON:  None FINDINGS: Cardiovascular: Minimal atherosclerotic calcification of descending thoracic aorta. Aorta normal caliber without aneurysm or dissection. Heart unremarkable. No pericardial effusion. Pulmonary arteries adequately opacified and patent. No evidence of pulmonary embolism. Mediastinum/Nodes: Esophagus unremarkable. Base of cervical region normal appearance. No thoracic adenopathy. Lungs/Pleura: Minimal dependent atelectasis in the lower lobes. Lungs otherwise clear. No pulmonary infiltrate, pleural effusion, or pneumothorax. Upper Abdomen: Visualized upper abdomen unremarkable Musculoskeletal: No acute osseous findings. Review of the MIP images confirms the above findings. IMPRESSION: No evidence of pulmonary embolism. Minimal dependent atelectasis in the lower lobes. Otherwise negative exam. Aortic Atherosclerosis (ICD10-I70.0). Electronically Signed   By: MLavonia DanaM.D.   On: 12/19/2021 15:09   ? ?Cardiac Studies  ? ?TTE pending ? ?CT PE Protocol 12/19/21: ?FINDINGS: ?Cardiovascular: Minimal atherosclerotic calcification of descending ?thoracic aorta. Aorta normal caliber without aneurysm or dissection. ?Heart unremarkable. No pericardial effusion. Pulmonary arteries ?adequately opacified and patent. No evidence of pulmonary embolism. ?  ?Mediastinum/Nodes: Esophagus unremarkable. Base of cervical region ?normal appearance. No thoracic adenopathy. ?  ?Lungs/Pleura: Minimal dependent atelectasis in the lower lobes. ?Lungs otherwise clear. No pulmonary infiltrate, pleural effusion, or ?pneumothorax. ?  ?Upper Abdomen: Visualized upper abdomen unremarkable ?  ?Musculoskeletal: No acute osseous findings. ?  ?Review of the MIP images confirms the above  findings. ?  ?IMPRESSION: ?No evidence of pulmonary embolism. ?  ?Minimal dependent atelectasis in the lower lobes. ?  ?Otherwise negative exam. ?  ? ?Patient Profile  ?   ?81y.o. female with history of HLD who presents to the ER for chest pain. ? ?Assessment & Plan  ?  ?#Chest pain: ?Patient presents with chest pain that has been ongoing for the past year. States that she has 2 different pains. One in the center of her chest that is constant and improves with tylenol and advil as well as with rest. Another different pain on the left side of her chest that worsens with activity. TTE on my prelim review with normal BiV function, no WMA. Difficult historian, however, given that symptoms do worsen with activity and see, to be progressing over the past several months, will pursue stress testing. [facility] Will plan for my[facility]iew tomorrow AM.  ?-Keep NPO at [facility] for myoview ?-Continue lipitor '40mg'$  daily ? ?#HLD: ?-Continue lipitor '40mg'$  daily ? ?   ? ?  For questions or updates, please contact [place] ?Please consult [email or web address] for contact info under  ? ?  ?   ?Signed, ?Freada Bergeron, MD  ?12/20/2021, 12:58 PM    ?

## 2021-12-19 NOTE — ED Provider Notes (Signed)
Cardiology is coming to consult on the patient and will decide on which hospital to have the patient admitted to ?  ?Milton Ferguson, MD ?12/19/21 1617 ? ?

## 2021-12-20 ENCOUNTER — Observation Stay (HOSPITAL_BASED_OUTPATIENT_CLINIC_OR_DEPARTMENT_OTHER): Payer: Medicare HMO

## 2021-12-20 DIAGNOSIS — R072 Precordial pain: Secondary | ICD-10-CM | POA: Diagnosis not present

## 2021-12-20 DIAGNOSIS — E7800 Pure hypercholesterolemia, unspecified: Secondary | ICD-10-CM | POA: Diagnosis not present

## 2021-12-20 DIAGNOSIS — K219 Gastro-esophageal reflux disease without esophagitis: Secondary | ICD-10-CM | POA: Diagnosis not present

## 2021-12-20 DIAGNOSIS — R079 Chest pain, unspecified: Secondary | ICD-10-CM | POA: Diagnosis not present

## 2021-12-20 DIAGNOSIS — E039 Hypothyroidism, unspecified: Secondary | ICD-10-CM | POA: Diagnosis not present

## 2021-12-20 DIAGNOSIS — R9431 Abnormal electrocardiogram [ECG] [EKG]: Secondary | ICD-10-CM

## 2021-12-20 LAB — ECHOCARDIOGRAM COMPLETE
AR max vel: 2.38 cm2
AV Area VTI: 2.41 cm2
AV Area mean vel: 2.21 cm2
AV Mean grad: 3 mmHg
AV Peak grad: 4.8 mmHg
Ao pk vel: 1.09 m/s
Area-P 1/2: 2.97 cm2
Calc EF: 60.2 %
Height: 62 in
MV VTI: 1.72 cm2
S' Lateral: 2.4 cm
Single Plane A2C EF: 58.5 %
Single Plane A4C EF: 60.5 %
Weight: 1759.8 oz

## 2021-12-20 LAB — LIPID PANEL
Cholesterol: 133 mg/dL (ref 0–200)
HDL: 51 mg/dL (ref >40)
LDL Cholesterol: 61 mg/dL (ref 0–99)
Total CHOL/HDL Ratio: 2.6 RATIO
Triglycerides: 103 mg/dL (ref <150)
VLDL: 21 mg/dL (ref 0–40)

## 2021-12-20 LAB — GLUCOSE, CAPILLARY
Glucose-Capillary: 111 mg/dL — ABNORMAL HIGH (ref 70–99)
Glucose-Capillary: 278 mg/dL — ABNORMAL HIGH (ref 70–99)
Glucose-Capillary: 54 mg/dL — ABNORMAL LOW (ref 70–99)
Glucose-Capillary: 76 mg/dL (ref 70–99)

## 2021-12-20 LAB — HEMOGLOBIN A1C
Hgb A1c MFr Bld: 5.5 % (ref 4.8–5.6)
Mean Plasma Glucose: 111.15 mg/dL

## 2021-12-20 MED ORDER — DEXTROSE 50 % IV SOLN
1.0000 | Freq: Once | INTRAVENOUS | Status: AC
  Administered 2021-12-20: 50 mL via INTRAVENOUS

## 2021-12-20 MED ORDER — DEXTROSE 50 % IV SOLN
INTRAVENOUS | Status: AC
  Filled 2021-12-20: qty 50

## 2021-12-20 NOTE — Progress Notes (Signed)
?  Transition of Care (TOC) Screening Note ? ? ?Patient Details  ?Name: Terry Allen ?Date of Birth: [birth year] ? ? ?Transition of Care (TOC) CM/SW Contact:    ?Iona Beard, LCSWA ?Phone Number: ?12/20/2021, 11:14 AM ? ? ? ?Transition of Care Department [facility]) has reviewed patient and no TOC needs have been identified at this time. We will continue to monitor patient advancement through interdisciplinary progression rounds. If new patient transition needs arise, please place a TOC consult. ?  ?

## 2021-12-20 NOTE — Progress Notes (Signed)
?PROGRESS NOTE ? ? ? ?WEDA BAUMGARNER  WJX:[id number] DOB: Aug 21, 1941 DOA: 12/19/2021 ?PCP: [name]  ? ? ?Brief Narrative:  ?81 y/o female admitted to the hospital with atypical chest pain. Troponins negative. Cardiology following and plans are for stress test on 4/14 ? ? ?Assessment & Plan: ?  ?Principal Problem: ?  Chest pain ?Active Problems: ?  GERD (gastroesophageal reflux disease) ?  Hypothyroidism ?  High cholesterol ? ? ?Chest pain, atypical ?-cardiology following ?-troponins negative ?-echo done, report pending ?-due to risk factors plans are to undergo stress test in AM ? ?HLD ?-continue statin ? ?Hypothyroidism ?-TSH 9.4 ?-continue on synthroid ?-will inquire about patient compliance ? ?GERD ?-continue PPI ? ? ?DVT prophylaxis: heparin injection 5,000 Units Start: 12/19/21 2200 ?SCDs Start: 12/19/21 2107 ?Place TED hose Start: 12/19/21 2107 ? ?Code Status: full code ?Family Communication: discussed with significant other at bedside ?Disposition Plan: Status is: Observation ?The patient remains OBS appropriate and will d/c before 2 midnights. ? ? ? ? ?Consultants:  ?cardiology ? ?Procedures:  ?echo ? ?Antimicrobials:  ?  ? ? ?Subjective: ?Still has occasional chest pain. Reports that it radiates into her neck. No pain or shortness of breath at present ? ?Objective: ?Vitals:  ? 12/19/21 2255 12/20/21 0055 12/20/21 0502 12/20/21 1100  ?BP: 105/79 118/72 (!) 85/48 115/72  ?Pulse: 71 65 64 61  ?Resp:  '14 15 14  '$ ?Temp:  99.1 ?F (37.3 ?C) 98.9 ?F (37.2 ?C) 97.7 ?F (36.5 ?C)  ?TempSrc:    Axillary  ?SpO2: 100% 97% 98% 97%  ?Weight:      ?Height:      ? ? ?Intake/Output Summary (Last 24 hours) at 12/20/2021 1512 ?Last data filed at 12/20/2021 1025 ?Gross per 24 hour  ?Intake 3 ml  ?Output --  ?Net 3 ml  ? ?Filed Weights  ? 12/19/21 1159  ?Weight: 49.9 kg  ? ? ?Examination: ? ?General exam: Appears calm and comfortable  ?Respiratory system: Clear to auscultation. Respiratory effort  normal. ?Cardiovascular system: S1 & S2 heard, RRR. No JVD, murmurs, rubs, gallops or clicks. No pedal edema. ?Gastrointestinal system: Abdomen is nondistended, soft and nontender. No organomegaly or masses felt. Normal bowel sounds heard. ?Central nervous system: Alert and oriented. No focal neurological deficits. ?Extremities: Symmetric 5 x 5 power. ?Skin: No rashes, lesions or ulcers ?Psychiatry: Judgement and insight appear normal. Mood & affect appropriate.  ? ? ? ?Data Reviewed: I have personally reviewed following labs and imaging studies ? ?CBC: ?Recent Labs  ?Lab 12/19/21 ?1250  ?WBC 4.4  ?HGB 11.8*  ?HCT 36.5  ?MCV 98.6  ?PLT 186  ? ?Basic Metabolic Panel: ?Recent Labs  ?Lab 12/19/21 ?1250  ?NA 136  ?K 3.8  ?CL 102  ?CO2 30  ?GLUCOSE 113*  ?BUN 15  ?CREATININE 1.01*  ?CALCIUM 9.2  ? ?GFR: ?Estimated Creatinine Clearance: 35.6 mL/min (A) (by C-G formula based on SCr of 1.01 mg/dL (H)). ?Liver Function Tests: ?No results for input(s): AST, ALT, ALKPHOS, BILITOT, PROT, ALBUMIN in the last 168 hours. ?No results for input(s): LIPASE, AMYLASE in the last 168 hours. ?No results for input(s): AMMONIA in the last 168 hours. ?Coagulation Profile: ?No results for input(s): INR, PROTIME in the last 168 hours. ?Cardiac Enzymes: ?No results for input(s): CKTOTAL, CKMB, CKMBINDEX, TROPONINI in the last 168 hours. ?BNP (last 3 results) ?No results for input(s): PROBNP in the last 8760 hours. ?HbA1C: ?Recent Labs  ?  12/19/21 ?1250  ?HGBA1C 5.5  ? ?CBG: ?Recent Labs  ?  Lab 12/20/21 ?0005 12/20/21 ?2536 12/20/21 ?1150  ?GLUCAP 111* 76 54*  ? ?Lipid Profile: ?Recent Labs  ?  12/20/21 ?6440  ?CHOL 133  ?HDL 51  ?[place] 61  ?TRIG 103  ?CHOLHDL 2.6  ? ?Thyroid Function Tests: ?Recent Labs  ?  12/19/21 ?1250  ?TSH 9.499*  ? ?Anemia Panel: ?No results for input(s): VITAMINB12, FOLATE, FERRITIN, TIBC, IRON, RETICCTPCT in the last 72 hours. ?Sepsis Labs: ?No results for input(s): PROCALCITON, LATICACIDVEN in the last 168  hours. ? ?Recent Results (from the past 240 hour(s))  ?Microscopic Examination     Status: Abnormal  ? Collection Time: 12/11/21  3:15 PM  ? Urine  ?Result Value Ref Range Status  ? WBC, UA 0-5 0 - 5 /hpf Final  ? RBC 0-2 0 - 2 /hpf Final  ? Epithelial Cells (non renal) 0-10 0 - 10 /hpf Final  ? Renal Epithel, UA None seen None seen /hpf Final  ? Bacteria, UA Moderate (A) None seen/Few Final  ?  ? ? ? ? ? ?Radiology Studies: ?DG Chest 2 View ? ?Result Date: 12/19/2021 ?CLINICAL DATA:  Chest pain. Pain radiating down left arm and up to neck. EXAM: CHEST - 2 VIEW COMPARISON:  Chest two views 10/06/2019 FINDINGS: Cardiac silhouette and mediastinal contours are within normal limits. There is again moderate hyperinflation. The lungs are clear. No pleural effusion or pneumothorax. Diffuse decreased bone mineralization. Mild-to-moderate multilevel degenerative disc changes of the mid to upper thoracic spine similar to prior. Mild dextrocurvature of the mid to upper thoracic spine and mild levocurvature of the thoracolumbar junction. IMPRESSION: No active cardiopulmonary disease. Electronically Signed   By: Yvonne Kendall M.D.   On: 12/19/2021 12:27  ? ?CT Angio Chest PE W and/or Wo Contrast ? ?Result Date: 12/19/2021 ?CLINICAL DATA:  LEFT side chest pain radiating down LEFT arm, high clinical suspicion of pulmonary embolism. EXAM: CT ANGIOGRAPHY CHEST WITH CONTRAST TECHNIQUE: Multidetector CT imaging of the chest was performed using the standard protocol during bolus administration of intravenous contrast. Multiplanar CT image reconstructions and MIPs were obtained to evaluate the vascular anatomy. RADIATION DOSE REDUCTION: This exam was performed according to the departmental dose-optimization program which includes automated exposure control, adjustment of the mA and/or kV according to patient size and/or use of iterative reconstruction technique. CONTRAST:  44m OMNIPAQUE IOHEXOL 350 MG/ML SOLN IV COMPARISON:  None  FINDINGS: Cardiovascular: Minimal atherosclerotic calcification of descending thoracic aorta. Aorta normal caliber without aneurysm or dissection. Heart unremarkable. No pericardial effusion. Pulmonary arteries adequately opacified and patent. No evidence of pulmonary embolism. Mediastinum/Nodes: Esophagus unremarkable. Base of cervical region normal appearance. No thoracic adenopathy. Lungs/Pleura: Minimal dependent atelectasis in the lower lobes. Lungs otherwise clear. No pulmonary infiltrate, pleural effusion, or pneumothorax. Upper Abdomen: Visualized upper abdomen unremarkable Musculoskeletal: No acute osseous findings. Review of the MIP images confirms the above findings. IMPRESSION: No evidence of pulmonary embolism. Minimal dependent atelectasis in the lower lobes. Otherwise negative exam. Aortic Atherosclerosis (ICD10-I70.0). Electronically Signed   By: MLavonia DanaM.D.   On: 12/19/2021 15:09   ? ? ? ? ? ?Scheduled Meds: ? amitriptyline  50 mg Oral QHS  ? atorvastatin  40 mg Oral QHS  ? famotidine  20 mg Oral BID  ? gabapentin  300 mg Oral TID  ? heparin  5,000 Units Subcutaneous Q8H  ? levothyroxine  50 mcg Oral Q0600  ? mirabegron ER  50 mg Oral Daily  ? sodium chloride flush  3 mL Intravenous Q12H  ?  sodium chloride flush  3 mL Intravenous Q12H  ? ?Continuous Infusions: ? sodium chloride    ? ? ? LOS: 0 days  ? ? ?Time spent: 62mns ? ? ? ?JKathie Dike MD ?[facility] ? ? ?If 7PM-7AM, please contact night-coverage ?[email or web address] ? ?12/20/2021, 3:12 PM  ? ?

## 2021-12-20 NOTE — Progress Notes (Signed)
*  PRELIMINARY RESULTS* ?Echocardiogram ?2D Echocardiogram has been performed. ? ?Terry Allen ?12/20/2021, 12:02 PM ?

## 2021-12-20 NOTE — Care Management Obs Status (Signed)
MEDICARE OBSERVATION STATUS NOTIFICATION ? ? ?Patient Details  ?Name: Terry Allen ?MRN: [id number] ?Date of Birth: Jul 15, 1941 ? ? ?Medicare Observation Status Notification Given:  Yes ? ? ? ?Iona Beard, LCSWA ?12/20/2021, 8:44 AM ?

## 2021-12-21 ENCOUNTER — Observation Stay (HOSPITAL_BASED_OUTPATIENT_CLINIC_OR_DEPARTMENT_OTHER): Payer: Medicare HMO

## 2021-12-21 ENCOUNTER — Encounter (HOSPITAL_COMMUNITY): Payer: Self-pay | Admitting: Family Medicine

## 2021-12-21 DIAGNOSIS — E039 Hypothyroidism, unspecified: Secondary | ICD-10-CM | POA: Diagnosis not present

## 2021-12-21 DIAGNOSIS — R079 Chest pain, unspecified: Secondary | ICD-10-CM

## 2021-12-21 DIAGNOSIS — R072 Precordial pain: Secondary | ICD-10-CM | POA: Diagnosis not present

## 2021-12-21 DIAGNOSIS — E7800 Pure hypercholesterolemia, unspecified: Secondary | ICD-10-CM | POA: Diagnosis not present

## 2021-12-21 DIAGNOSIS — K219 Gastro-esophageal reflux disease without esophagitis: Secondary | ICD-10-CM | POA: Diagnosis not present

## 2021-12-21 LAB — NM MYOCAR MULTI W/SPECT W/WALL MOTION / EF
LV dias vol: 61 mL (ref 46–106)
LV sys vol: 18 mL
Nuc Stress EF: 70 %
Peak HR: 141 {beats}/min
RATE: 0.4
Rest HR: 59 {beats}/min
Rest Nuclear Isotope Dose: 11 mCi
SDS: 2
SRS: 0
SSS: 2
ST Depression (mm): 0 mm
Stress Nuclear Isotope Dose: 28 mCi
TID: 1.2

## 2021-12-21 LAB — GLUCOSE, CAPILLARY
Glucose-Capillary: 102 mg/dL — ABNORMAL HIGH (ref 70–99)
Glucose-Capillary: 103 mg/dL — ABNORMAL HIGH (ref 70–99)
Glucose-Capillary: 64 mg/dL — ABNORMAL LOW (ref 70–99)
Glucose-Capillary: 99 mg/dL (ref 70–99)

## 2021-12-21 MED ORDER — SODIUM CHLORIDE FLUSH 0.9 % IV SOLN
INTRAVENOUS | Status: AC
  Administered 2021-12-21: 10 mL via INTRAVENOUS
  Filled 2021-12-21: qty 10

## 2021-12-21 MED ORDER — REGADENOSON 0.4 MG/5ML IV SOLN
INTRAVENOUS | Status: AC
  Administered 2021-12-21: 0.08 mg via INTRAVENOUS
  Filled 2021-12-21: qty 5

## 2021-12-21 MED ORDER — TECHNETIUM TC 99M TETROFOSMIN IV KIT
10.0000 | PACK | Freq: Once | INTRAVENOUS | Status: AC | PRN
  Administered 2021-12-21: 11 via INTRAVENOUS

## 2021-12-21 MED ORDER — TECHNETIUM TC 99M TETROFOSMIN IV KIT
30.0000 | PACK | Freq: Once | INTRAVENOUS | Status: AC | PRN
  Administered 2021-12-21: 28 via INTRAVENOUS

## 2021-12-21 NOTE — Discharge Summary (Addendum)
Physician Discharge Summary  ?Terry Allen XKG:[id number] DOB: 02-Sep-1941 DOA: 12/19/2021 ? ?PCP: [name] ? ?Admit date: 12/19/2021 ?Discharge date: 12/21/2021 ? ?Admitted From: Home ?Disposition: Home ? ?Recommendations for Outpatient Follow-up:  ?Follow up with PCP in 1-2 weeks ?Please obtain BMP/CBC in one week ?Repeat TSH in the next 4 weeks. ? ?Discharge Condition: Stable ?CODE STATUS: Full code ?Diet recommendation: Heart healthy ? ?Brief/Interim Summary: ?81 year old female with a history of hypothyroidism, hyperlipidemia, GERD, admitted to the hospital with chest discomfort.  Symptoms were somewhat atypical.  She ruled out for ACS with negative cardiac markers.  EKG did not show any acute findings.  Echocardiogram showed normal EF.  She did report symptoms have been present intermittently for the past year.  Seen by cardiology and underwent stress test that was found to be low risk study.  She was felt stable for discharge home.  She did not have any recurrence of symptoms during her hospital stay. ? ?She does have hypothyroidism and is on Synthroid 50 mcg daily.  TSH was checked and noted to be elevated at 9.4.  After discussing with patient, she does note that she had not been taking her Synthroid on an empty stomach and has been combining it with her other medications.  She has been advised to take Synthroid on empty stomach and repeat thyroid function test in the next 4 weeks. ? ?Discharge Diagnoses:  ?Principal Problem: ?  Chest pain ?Active Problems: ?  GERD (gastroesophageal reflux disease) ?  Hypothyroidism ?  High cholesterol ? ? ? ?Discharge Instructions ? ?Discharge Instructions   ? ? Diet - low sodium heart healthy   Complete by: As directed ?  ? Increase activity slowly   Complete by: As directed ?  ? ?  ? ?Allergies as of 12/21/2021   ?No Known Allergies ?  ? ?  ?Medication List  ?  ? ?TAKE these medications   ? ?acetaminophen 500 MG tablet ?Commonly known as: TYLENOL ?Take  500 mg by mouth every 6 (six) hours as needed for moderate pain. ?  ?amitriptyline 50 MG tablet ?Commonly known as: ELAVIL ?Take 50 mg by mouth at bedtime. ?  ?atorvastatin 40 MG tablet ?Commonly known as: LIPITOR ?Take 40 mg by mouth at bedtime. ?  ?CALCIUM 600 + D PO ?Take 1 tablet by mouth 2 (two) times a day. ?  ?famotidine 20 MG tablet ?Commonly known as: Pepcid ?Take 1 tablet (20 mg total) by mouth 2 (two) times daily. ?  ?FDGARD PO ?Take 1 tablet by mouth daily as needed (acid reflux). ?  ?gabapentin 300 MG capsule ?Commonly known as: NEURONTIN ?Take 2 capsules (600 mg total) by mouth 3 (three) times daily. ?What changed: how much to take ?  ?levothyroxine 50 MCG tablet ?Commonly known as: SYNTHROID ?Take 50 mcg by mouth See admin instructions. ?  ?mirabegron ER 50 MG Tb24 tablet ?Commonly known as: MYRBETRIQ ?Take 1 tablet (50 mg total) by mouth daily. ?  ?nitrofurantoin (macrocrystal-monohydrate) 100 MG capsule ?Commonly known as: MACROBID ?Take 1 capsule (100 mg total) by mouth daily. ?  ?ondansetron 4 MG tablet ?Commonly known as: ZOFRAN ?Take 4 mg by mouth every 8 (eight) hours as needed for nausea or vomiting. ?  ? ?  ? ? ?No Known Allergies ? ?Consultations: ?Cardiology ? ? ?Procedures/Studies: ?DG Chest 2 View ? ?Result Date: 12/19/2021 ?CLINICAL DATA:  Chest pain. Pain radiating down left arm and up to neck. EXAM: CHEST - 2 VIEW COMPARISON:  Chest two  views 10/06/2019 FINDINGS: Cardiac silhouette and mediastinal contours are within normal limits. There is again moderate hyperinflation. The lungs are clear. No pleural effusion or pneumothorax. Diffuse decreased bone mineralization. Mild-to-moderate multilevel degenerative disc changes of the mid to upper thoracic spine similar to prior. Mild dextrocurvature of the mid to upper thoracic spine and mild levocurvature of the thoracolumbar junction. IMPRESSION: No active cardiopulmonary disease. Electronically Signed   By: Yvonne Kendall M.D.   On:  12/19/2021 12:27  ? ?CT Angio Chest PE W and/or Wo Contrast ? ?Result Date: 12/19/2021 ?CLINICAL DATA:  LEFT side chest pain radiating down LEFT arm, high clinical suspicion of pulmonary embolism. EXAM: CT ANGIOGRAPHY CHEST WITH CONTRAST TECHNIQUE: Multidetector CT imaging of the chest was performed using the standard protocol during bolus administration of intravenous contrast. Multiplanar CT image reconstructions and MIPs were obtained to evaluate the vascular anatomy. RADIATION DOSE REDUCTION: This exam was performed according to the departmental dose-optimization program which includes automated exposure control, adjustment of the mA and/or kV according to patient size and/or use of iterative reconstruction technique. CONTRAST:  15m OMNIPAQUE IOHEXOL 350 MG/ML SOLN IV COMPARISON:  None FINDINGS: Cardiovascular: Minimal atherosclerotic calcification of descending thoracic aorta. Aorta normal caliber without aneurysm or dissection. Heart unremarkable. No pericardial effusion. Pulmonary arteries adequately opacified and patent. No evidence of pulmonary embolism. Mediastinum/Nodes: Esophagus unremarkable. Base of cervical region normal appearance. No thoracic adenopathy. Lungs/Pleura: Minimal dependent atelectasis in the lower lobes. Lungs otherwise clear. No pulmonary infiltrate, pleural effusion, or pneumothorax. Upper Abdomen: Visualized upper abdomen unremarkable Musculoskeletal: No acute osseous findings. Review of the MIP images confirms the above findings. IMPRESSION: No evidence of pulmonary embolism. Minimal dependent atelectasis in the lower lobes. Otherwise negative exam. Aortic Atherosclerosis (ICD10-I70.0). Electronically Signed   By: MLavonia DanaM.D.   On: 12/19/2021 15:09  ? ?NM Myocar Multi W/Spect W/Wall Motion / EF ? ?Result Date: 12/21/2021 ?  The study is normal. The study is low risk.   No ST deviation was noted.   LV perfusion is normal. There is no evidence of ischemia. There is evidence of  infarction.   Left ventricular function is normal. End diastolic cavity size is normal. End systolic cavity size is normal. Normal resting and stress perfusion. No ischemia or infarction EF 70%  ? ?ECHOCARDIOGRAM COMPLETE ? ?Result Date: 12/20/2021 ?   ECHOCARDIOGRAM REPORT   Patient Name:   CANNYA LIZANADate of Exam: 12/20/2021 Medical Rec #:  [id number]            Height:       62.0 in Accession #:    [id number]           Weight:       110.0 lb Date of Birth:  [birth year]             BSA:          1.483 m? Patient Age:    89years              BP:           115/72 mmHg Patient Gender: F                     HR:           68 bpm. [facility]am [facility]  [facility]cedure: 2D Echo, Cardiac Doppler and Color Doppler Indications:    Abnormal ECG  History:        Patient has no prior  history of Echocardiogram examinations.                 Signs/Symptoms:Chest Pain; Risk Factors:Dyslipidemia.  Sonographer:    Wenda Low Referring Phys: [place]  1. Left ventricular ejection fraction, by estimation, is 60 to 65%. The left ventricle has normal function. The left ventricle has no regional wall motion abnormalities. There is mild asymmetric left ventricular hypertrophy of the basal-septal segment. Left ventricular diastolic parameters are consistent with Grade I diastolic dysfunction (impaired relaxation).  2. Right ventricular systolic function is normal. The right ventricular size is normal. There is normal pulmonary artery systolic pressure. The estimated right ventricular systolic pressure is 99.7 mmHg.  3. The mitral valve is grossly normal. Trivial mitral valve regurgitation.  4. The aortic valve was not well visualized. Aortic valve regurgitation is trivial. Aortic valve sclerosis/calcification is present, without any evidence of aortic stenosis.  5. The inferior vena cava is normal in size with greater than 50% respiratory variability, suggesting right atrial pressure of 3 mmHg.  Comparison(s): No prior Echocardiogram. FINDINGS  Left Ventricle: Left ventricular ejection fraction, by estimation, is 60 to 65%. The left ventricle has normal function. The left ventricle has no regional wall motio

## 2021-12-21 NOTE — Progress Notes (Addendum)
? ?Progress Note ? ?Patient Name: Terry Allen ?Date of Encounter: 12/21/2021 ? ?[facility] Cardiologist: Werner Lean, MD  ? ?Subjective  ? ?She reports some discomfort under her left breast but reports this has been the chronic pain she has experienced for over a year. No recurrent sharp pain.  Breathing at baseline. She has been NPO since midnight for stress testing today. ? ?Inpatient Medications  ?  ?Scheduled Meds: ? amitriptyline  50 mg Oral QHS  ? atorvastatin  40 mg Oral QHS  ? famotidine  20 mg Oral BID  ? gabapentin  300 mg Oral TID  ? heparin  5,000 Units Subcutaneous Q8H  ? levothyroxine  50 mcg Oral Q0600  ? mirabegron ER  50 mg Oral Daily  ? sodium chloride flush  3 mL Intravenous Q12H  ? sodium chloride flush  3 mL Intravenous Q12H  ? ?Continuous Infusions: ? sodium chloride    ? ?PRN Meds: ?sodium chloride, acetaminophen **OR** acetaminophen, bisacodyl, nitroGLYCERIN, ondansetron **OR** ondansetron (ZOFRAN) IV, polyethylene glycol, sodium chloride flush, technetium tetrofosmin, traZODone  ? ?Vital Signs  ?  ?Vitals:  ? 12/20/21 0502 12/20/21 1100 12/20/21 2122 12/21/21 0516  ?BP: (!) 85/48 115/72 (!) 111/57 109/61  ?Pulse: 64 61 66 64  ?Resp: '15 14 18 18  '$ ?Temp: 98.9 ?F (37.2 ?C) 97.7 ?F (36.5 ?C) 98.3 ?F (36.8 ?C) 97.8 ?F (36.6 ?C)  ?TempSrc:  Axillary    ?SpO2: 98% 97% 98% 98%  ?Weight:      ?Height:      ? ? ?Intake/Output Summary (Last 24 hours) at 12/21/2021 0738 ?Last data filed at 12/20/2021 1700 ?Gross per 24 hour  ?Intake 243 ml  ?Output --  ?Net 243 ml  ? ? ?  12/19/2021  ? 11:59 AM 12/03/2021  ?  2:09 PM 10/12/2021  ? 12:20 PM  ?Last 3 Weights  ?Weight (lbs) 109 lb 15.8 oz 110 lb 106 lb  ?Weight (kg) 49.89 kg 49.896 kg 48.081 kg  ?   ? ?Telemetry  ?  ?Normal sinus rhythm, heart rate in the 60's with occasional PVC's.- Personally Reviewed ? ?ECG  ?  ?NSR, HR 78 with RBBB.  - Personally Reviewed ? ?Physical Exam  ? ?GEN: No acute distress.   ?Neck: No JVD ?Cardiac: RRR, no  murmurs, rubs, or gallops.  ?Respiratory: Clear to auscultation bilaterally. ?GI: Soft, nontender, non-distended  ?MS: No pitting edema; No deformity. ?Neuro:  Nonfocal  ?Psych: Normal affect  ? ?Labs  ?  ?High Sensitivity Troponin:   ?Recent Labs  ?Lab 12/19/21 ?1250 12/19/21 ?1420  ?TROPONINIHS 2 <2  ?   ?Chemistry ?Recent Labs  ?Lab 12/19/21 ?1250  ?NA 136  ?K 3.8  ?CL 102  ?CO2 30  ?GLUCOSE 113*  ?BUN 15  ?CREATININE 1.01*  ?CALCIUM 9.2  ?GFRNONAA 57*  ?ANIONGAP 4*  ?  ?Lipids  ?Recent Labs  ?Lab 12/20/21 ?4193  ?CHOL 133  ?TRIG 103  ?HDL 51  ?[place] 61  ?CHOLHDL 2.6  ?  ?Hematology ?Recent Labs  ?Lab 12/19/21 ?1250  ?WBC 4.4  ?RBC 3.70*  ?HGB 11.8*  ?HCT 36.5  ?MCV 98.6  ?MCH 31.9  ?MCHC 32.3  ?RDW 12.4  ?PLT 186  ? ?Thyroid  ?Recent Labs  ?Lab 12/19/21 ?1250  ?TSH 9.499*  ?  ?BNPNo results for input(s): BNP, PROBNP in the last 168 hours.  ?DDimer No results for input(s): DDIMER in the last 168 hours.  ? ?Radiology  ?  ?DG Chest 2 View ? ?  Result Date: 12/19/2021 ?CLINICAL DATA:  Chest pain. Pain radiating down left arm and up to neck. EXAM: CHEST - 2 VIEW COMPARISON:  Chest two views 10/06/2019 FINDINGS: Cardiac silhouette and mediastinal contours are within normal limits. There is again moderate hyperinflation. The lungs are clear. No pleural effusion or pneumothorax. Diffuse decreased bone mineralization. Mild-to-moderate multilevel degenerative disc changes of the mid to upper thoracic spine similar to prior. Mild dextrocurvature of the mid to upper thoracic spine and mild levocurvature of the thoracolumbar junction. IMPRESSION: No active cardiopulmonary disease. Electronically Signed   By: Yvonne Kendall M.D.   On: 12/19/2021 12:27  ? ?CT Angio Chest PE W and/or Wo Contrast ? ?Result Date: 12/19/2021 ?CLINICAL DATA:  LEFT side chest pain radiating down LEFT arm, high clinical suspicion of pulmonary embolism. EXAM: CT ANGIOGRAPHY CHEST WITH CONTRAST TECHNIQUE: Multidetector CT imaging of the chest was  performed using the standard protocol during bolus administration of intravenous contrast. Multiplanar CT image reconstructions and MIPs were obtained to evaluate the vascular anatomy. RADIATION DOSE REDUCTION: This exam was performed according to the departmental dose-optimization program which includes automated exposure control, adjustment of the mA and/or kV according to patient size and/or use of iterative reconstruction technique. CONTRAST:  52m OMNIPAQUE IOHEXOL 350 MG/ML SOLN IV COMPARISON:  None FINDINGS: Cardiovascular: Minimal atherosclerotic calcification of descending thoracic aorta. Aorta normal caliber without aneurysm or dissection. Heart unremarkable. No pericardial effusion. Pulmonary arteries adequately opacified and patent. No evidence of pulmonary embolism. Mediastinum/Nodes: Esophagus unremarkable. Base of cervical region normal appearance. No thoracic adenopathy. Lungs/Pleura: Minimal dependent atelectasis in the lower lobes. Lungs otherwise clear. No pulmonary infiltrate, pleural effusion, or pneumothorax. Upper Abdomen: Visualized upper abdomen unremarkable Musculoskeletal: No acute osseous findings. Review of the MIP images confirms the above findings. IMPRESSION: No evidence of pulmonary embolism. Minimal dependent atelectasis in the lower lobes. Otherwise negative exam. Aortic Atherosclerosis (ICD10-I70.0). Electronically Signed   By: MLavonia DanaM.D.   On: 12/19/2021 15:09  ? ? ?Cardiac Studies  ? ?Echocardiogram: 12/21/2021 ?IMPRESSIONS  ? ? ? 1. Left ventricular ejection fraction, by estimation, is 60 to 65%. The  ?left ventricle has normal function. The left ventricle has no regional  ?wall motion abnormalities. There is mild asymmetric left ventricular  ?hypertrophy of the basal-septal segment.  ?Left ventricular diastolic parameters are consistent with Grade I  ?diastolic dysfunction (impaired relaxation).  ? 2. Right ventricular systolic function is normal. The right ventricular   ?size is normal. There is normal pulmonary artery systolic pressure. The  ?estimated right ventricular systolic pressure is 272.5mmHg.  ? 3. The mitral valve is grossly normal. Trivial mitral valve  ?regurgitation.  ? 4. The aortic valve was not well visualized. Aortic valve regurgitation  ?is trivial. Aortic valve sclerosis/calcification is present, without any  ?evidence of aortic stenosis.  ? 5. The inferior vena cava is normal in size with greater than 50%  ?respiratory variability, suggesting right atrial pressure of 3 mmHg.  ? ?Comparison(s): No prior Echocardiogram.  ? ?Patient Profile  ?   ?81y.o. female with past medical history of HLD, hypothyroidism and GERD who is currently admitted for evaluation of chest pain. ? ?Assessment & Plan  ?  ?1. Chest Pain with Mixed Features ?- Presented with intermittent episodes of chest pain for the past year but describes two different pains with one being in the center of her chest and improving with NSAIDs and the other being more progressive with activity. ?- Hs Troponin values have been negative  at 2 and < 2. EKG shows normal sinus rhythm with a RBBB. CTA showed no evidence of a PE and mentioned minimal atherosclerotic calcification of the descending aorta with no mention of coronary calcification. Echocardiogram shows a preserved EF of 60 to 65% with no regional wall motion abnormalities.  She did have mild asymmetric LVH of the basal septal segment and grade 1 diastolic dysfunction. ?- A Lexiscan Myoview was recommended for further ischemic evaluation given continued intermittent episodes of pain since admission and was performed this AM with no immediate complications. Official report pending following stress images.  ? ?2. HLD ?- FLP shows total cholesterol 133, triglycerides 103, HDL 51 and LDL 61.  Continue Atorvastatin 40 mg daily. ? ?For questions or updates, please contact [place] ?Please consult [email or web address] for contact info under  ? ?  ?    ?Signed, ?Erma Heritage, PA-C  ?12/21/2021, 7:38 AM   ? ?Patient examined chart reviewed Atypical pain r/o no acute changes Myovue this am Should be able to read at lunch time Exam benign clear lungs no murmur

## 2021-12-21 NOTE — Progress Notes (Signed)
Discharge instructions read to patient and family  They verbalized understanding of all instructions. ?

## 2021-12-24 DIAGNOSIS — I10 Essential (primary) hypertension: Secondary | ICD-10-CM | POA: Diagnosis not present

## 2021-12-24 DIAGNOSIS — E039 Hypothyroidism, unspecified: Secondary | ICD-10-CM | POA: Diagnosis not present

## 2021-12-24 DIAGNOSIS — Z6820 Body mass index (BMI) 20.0-20.9, adult: Secondary | ICD-10-CM | POA: Diagnosis not present

## 2021-12-24 DIAGNOSIS — R110 Nausea: Secondary | ICD-10-CM | POA: Diagnosis not present

## 2021-12-24 DIAGNOSIS — K219 Gastro-esophageal reflux disease without esophagitis: Secondary | ICD-10-CM | POA: Diagnosis not present

## 2021-12-24 DIAGNOSIS — R0789 Other chest pain: Secondary | ICD-10-CM | POA: Diagnosis not present

## 2022-01-08 ENCOUNTER — Ambulatory Visit (INDEPENDENT_AMBULATORY_CARE_PROVIDER_SITE_OTHER): Payer: PPO | Admitting: Internal Medicine

## 2022-01-08 ENCOUNTER — Encounter (INDEPENDENT_AMBULATORY_CARE_PROVIDER_SITE_OTHER): Payer: Self-pay | Admitting: Internal Medicine

## 2022-01-08 VITALS — BP 141/96 | HR 71 | Temp 98.2°F | Ht 62.0 in | Wt 109.7 lb

## 2022-01-08 DIAGNOSIS — K219 Gastro-esophageal reflux disease without esophagitis: Secondary | ICD-10-CM

## 2022-01-08 DIAGNOSIS — R0789 Other chest pain: Secondary | ICD-10-CM | POA: Insufficient documentation

## 2022-01-08 DIAGNOSIS — R1319 Other dysphagia: Secondary | ICD-10-CM

## 2022-01-08 MED ORDER — ESOMEPRAZOLE MAGNESIUM 40 MG PO CPDR: 40.0000 mg | DELAYED_RELEASE_CAPSULE | Freq: Every day | ORAL | 5 refills | Status: DC

## 2022-01-08 NOTE — Progress Notes (Signed)
Presenting complaint; ? ?Follow-up for chronic GERD.  Patient complains of chest pain and dysphagia. ? ?Database and subjective: ? ?Patient is 81 year old Caucasian female who is here for scheduled visit.  She was last seen on 07/10/2021. ?She has chronic GERD complicated by short segment Barrett's esophagus.  However biopsies in October 2013 in April 2016 were negative for Barrett's.  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  On her last visit she was also complaining of abdominal pain which I felt was musculoskeletal and she had been using Tylenol and/or Advil on as-needed basis.  On her last visit she also complained of falling episodes.  She did have MR but she does not know the results. ?She also has a history of osteopenia but has not had any follow-up study since I last saw her. ? ?Patient is not complaining of chest pain.  She was briefly hospitalized about 3 weeks ago.  She experienced pain going into left side of her neck or arm.  She did not have shortness of breath palpitation or diaphoresis.  MI was ruled out.  She says she has had this pain off and on for couple of months.  She was taking 1 Advil and 2 Tylenol 2 or 3 times a day.  She says her transaminases were mildly elevated and Tylenol was discontinued.  She says pain occurs when she takes a deep breath.  She also complains of dysphagia to pills but has no difficulty with solids.  She also complains of coughing while she is eating.  This symptom has been going on for 3 to 4 months.  She has heartburn no more than once a week.  Her bowels move daily.  She denies melena or rectal bleeding.  She states her appetite is very good.  Her weight has been stable. ? ? ?Current Medications: ?Outpatient Encounter Medications as of 01/08/2022  ?Medication Sig  ? amitriptyline (ELAVIL) 50 MG tablet Take 50 mg by mouth at bedtime.   ? atorvastatin (LIPITOR) 40 MG tablet Take 40 mg by  mouth at bedtime.   ? Calcium Carb-Cholecalciferol (CALCIUM 600 + D PO) Take 1 tablet by mouth 2 (two) times a day.  ? [place] Oil-Levomenthol (FDGARD PO) Take 1 tablet by mouth daily as needed (acid reflux).   ? gabapentin (NEURONTIN) 300 MG capsule Take 2 capsules (600 mg total) by mouth 3 (three) times daily. (Patient taking differently: Take 300 mg by mouth 3 (three) times daily.)  ? levothyroxine (SYNTHROID) 50 MCG tablet Take 50 mcg by mouth See admin instructions.  ? MELOXICAM PO Take by mouth. One daily  ? mirabegron ER (MYRBETRIQ) 50 MG TB24 tablet Take 1 tablet (50 mg total) by mouth daily.  ? nitrofurantoin, macrocrystal-monohydrate, (MACROBID) 100 MG capsule Take 1 capsule (100 mg total) by mouth daily.  ? omeprazole (PRILOSEC) 40 MG capsule Take 40 mg by mouth daily.  ? sulfamethoxazole-trimethoprim (BACTRIM DS) 800-160 MG tablet Take 1 tablet by mouth 2 (two) times daily.  ? [DISCONTINUED] acetaminophen (TYLENOL) 500 MG tablet Take 500 mg by mouth every 6 (six) hours as needed for moderate pain.   ? [DISCONTINUED] famotidine (PEPCID) 20 MG tablet Take 1 tablet (20 mg total) by mouth 2 (two) times daily.  ? [DISCONTINUED] ondansetron (ZOFRAN) 4 MG tablet Take 4 mg by mouth every 8 (eight) hours as needed for nausea or vomiting.  (Patient not taking: Reported on 12/19/2021)  ? ?No facility-administered encounter  medications on file as of 01/08/2022.  ? ? ? ?Objective: ?Blood pressure (!) 141/96, pulse 71, temperature 98.2 ?F (36.8 ?C), temperature source Oral, height '5\' 2"'  (1.575 m), weight 109 lb 11.2 oz (49.8 kg). ?Patient is alert and in no acute distress. ?Conjunctiva is pink. Sclera is nonicteric ?Oropharyngeal mucosa is normal. ?No neck masses or thyromegaly noted. ?Cardiac exam with regular rhythm normal S1 and S2. No murmur or gallop noted. ?Lungs are clear to auscultation. ?Abdomen is symmetrical soft and nontender with organomegaly or masses. ?No LE edema or clubbing noted. ? ?Labs/studies  Results: ? ? ? ?  Latest Ref Rng & Units 12/19/2021  ? 12:50 PM 07/09/2017  ?  4:05 PM 06/30/2017  ? 10:16 AM  ?CBC  ?WBC 4.0 - 10.5 K/uL 4.4   7.4   11.9    ?Hemoglobin 12.0 - 15.0 g/dL 11.8   15.1   13.2    ?Hematocrit 36.0 - 46.0 % 36.5   45.4   39.0    ?Platelets 150 - 400 K/uL 186   229   219    ?  ? ?  Latest Ref Rng & Units 12/19/2021  ? 12:50 PM 06/08/2019  ?  6:21 PM 07/09/2017  ?  4:05 PM  ?CMP  ?Glucose 70 - 99 mg/dL 113    79    ?BUN 8 - 23 mg/dL 15    20    ?Creatinine 0.44 - 1.00 mg/dL 1.01   0.70   1.08    ?Sodium 135 - 145 mmol/L 136    137    ?Potassium 3.5 - 5.1 mmol/L 3.8    4.0    ?Chloride 98 - 111 mmol/L 102    95    ?CO2 22 - 32 mmol/L 30    28    ?Calcium 8.9 - 10.3 mg/dL 9.2    10.7    ?  ? ?  Latest Ref Rng & Units 06/30/2017  ? 10:16 AM 12/13/2016  ?  4:04 PM 10/10/2016  ?  8:13 AM  ?Hepatic Function  ?Total Protein 6.1 - 8.1 g/dL 6.7   6.8   6.9    ?Albumin 3.6 - 5.1 g/dL  3.8   4.2    ?AST 10 - 35 U/L '25   26   25    ' ?ALT 6 - 29 U/L '16   12   14    ' ?Alk Phosphatase 33 - 130 U/L  43   39    ?Total Bilirubin 0.2 - 1.2 mg/dL 1.0   0.9   1.1    ?Bilirubin, Direct 0.0 - 0.2 mg/dL 0.2      ?  ? ? ?Assessment: ? ?#1.  Chest pain appears to be wall pain.  It only occurs when she takes a deep breath.  Pain is not suggestive of GERD or esophageal spasm. ? ? ?#2.  Dysphagia.  She has both esophageal and pharyngeal dysphagia.  Last EGD with dilation was in June 2020.  She will benefit from further evaluation with barium pill esophagogram before referral to speech pathologist considered. ? ?#3.  Chronic GERD.  She has history of short segment Barrett's but last 2 EGDs were negative for Barrett's mucosa.  Therefore no plans for EGD at this time. ? ?#4.  History of osteopenia.  Once again patient reminded to talk with PCP about getting a follow-up bone density study. ? ? ?Plan: ? ?Barium pill esophagogram. ?Patient advised to take meloxicam with food or snack. ?  Patient advised to keep symptom diary as to  frequency of heartburn and chest pain for 1 month and call with progress report. ?Office visit in 6 months. ? ? ? ? ? ?

## 2022-01-08 NOTE — Patient Instructions (Addendum)
Physician will call with results of barium study. ?Keep symptom diary re heartburn and chest pain for one month and call with progress report. ?Always take Meloxicam with food. ? ? ?

## 2022-01-10 ENCOUNTER — Other Ambulatory Visit (HOSPITAL_COMMUNITY): Payer: Self-pay | Admitting: Physician Assistant

## 2022-01-10 DIAGNOSIS — M810 Age-related osteoporosis without current pathological fracture: Secondary | ICD-10-CM

## 2022-01-14 ENCOUNTER — Ambulatory Visit (HOSPITAL_COMMUNITY): Admission: RE | Admit: 2022-01-14 | Payer: Medicare HMO | Source: Ambulatory Visit

## 2022-01-16 ENCOUNTER — Ambulatory Visit (HOSPITAL_COMMUNITY)
Admission: RE | Admit: 2022-01-16 | Discharge: 2022-01-16 | Disposition: A | Discharge status: Home / Self Care | Payer: Medicare HMO | Source: Ambulatory Visit | Attending: Internal Medicine | Admitting: Internal Medicine

## 2022-01-16 ENCOUNTER — Other Ambulatory Visit (HOSPITAL_COMMUNITY): Payer: Self-pay | Admitting: Physician Assistant

## 2022-01-16 DIAGNOSIS — R1319 Other dysphagia: Secondary | ICD-10-CM | POA: Diagnosis not present

## 2022-01-16 DIAGNOSIS — T17320A Food in larynx causing asphyxiation, initial encounter: Secondary | ICD-10-CM | POA: Diagnosis not present

## 2022-01-16 DIAGNOSIS — R6339 Other feeding difficulties: Secondary | ICD-10-CM | POA: Diagnosis not present

## 2022-01-16 DIAGNOSIS — Z1231 Encounter for screening mammogram for malignant neoplasm of breast: Secondary | ICD-10-CM

## 2022-01-18 ENCOUNTER — Encounter (INDEPENDENT_AMBULATORY_CARE_PROVIDER_SITE_OTHER): Payer: Self-pay

## 2022-01-18 ENCOUNTER — Other Ambulatory Visit (INDEPENDENT_AMBULATORY_CARE_PROVIDER_SITE_OTHER): Payer: Self-pay

## 2022-01-18 DIAGNOSIS — Q394 Esophageal web: Secondary | ICD-10-CM

## 2022-01-23 ENCOUNTER — Ambulatory Visit (HOSPITAL_COMMUNITY)
Admission: RE | Admit: 2022-01-23 | Discharge: 2022-01-23 | Disposition: A | Discharge status: Home / Self Care | Payer: Medicare HMO | Source: Ambulatory Visit | Attending: Physician Assistant | Admitting: Physician Assistant

## 2022-01-23 DIAGNOSIS — Z1231 Encounter for screening mammogram for malignant neoplasm of breast: Secondary | ICD-10-CM | POA: Insufficient documentation

## 2022-01-23 DIAGNOSIS — M810 Age-related osteoporosis without current pathological fracture: Secondary | ICD-10-CM | POA: Insufficient documentation

## 2022-01-23 DIAGNOSIS — Z780 Asymptomatic menopausal state: Secondary | ICD-10-CM | POA: Diagnosis not present

## 2022-01-23 DIAGNOSIS — M8589 Other specified disorders of bone density and structure, multiple sites: Secondary | ICD-10-CM | POA: Diagnosis not present

## 2022-01-24 ENCOUNTER — Ambulatory Visit: Payer: Medicare HMO | Admitting: Urology

## 2022-01-24 DIAGNOSIS — R300 Dysuria: Secondary | ICD-10-CM | POA: Diagnosis not present

## 2022-01-24 DIAGNOSIS — R110 Nausea: Secondary | ICD-10-CM | POA: Diagnosis not present

## 2022-01-24 DIAGNOSIS — Z6820 Body mass index (BMI) 20.0-20.9, adult: Secondary | ICD-10-CM | POA: Diagnosis not present

## 2022-01-24 DIAGNOSIS — W57XXXA Bitten or stung by nonvenomous insect and other nonvenomous arthropods, initial encounter: Secondary | ICD-10-CM | POA: Diagnosis not present

## 2022-01-24 DIAGNOSIS — E039 Hypothyroidism, unspecified: Secondary | ICD-10-CM | POA: Diagnosis not present

## 2022-01-24 NOTE — Progress Notes (Deleted)
Assessment: 1. Recurrent UTI   2. Urge incontinence      Plan: Continue methods to reduce the risk of recurrent UTIs including increased fluid intake, timed and double voiding, daily cranberry supplement, and daily probiotic reviewed. Continue Myrbetriq 50 mg daily.   Continue daily Macrobid for UTI prevention I discussed further evaluation of her incontinence with urodynamics at [facility].  She would like to continue medical therapy for now. Return to office in 6 weeks.   Chief Complaint:  No chief complaint on file.   History of Present Illness:  Terry Allen is a 81 y.o. year old female who is seen for further evaluation of UTI's and urge incontinence. She has a history of chronic UTIs and has been followed by Dr. Gaynelle Arabian and Dr. Gloriann Loan at [facility].  She is status post a Pinnacle bladder sling and transobturator urethral sling by Dr. Gaetano Net in 2009.  She was treated for vaginal mesh extrusion by Dr. Burke Keels in August 2011.  She has had longstanding symptoms of frequency, urgency, and urge incontinence.  She has previously been managed with medical therapy for her symptoms.  She also has a history of recurrent UTIs.  She was previously treated with daily antibiotic prophylaxis but has not been on this for some time.  At her last visit in February 2022, she did not have evidence of UTI.  Renal ultrasound from 1/22 did not demonstrate any renal or bladder abnormalities.  She was being managed with vaginal Estrace cream and a daily probiotic.  She reports recurrent UTIs since June 2022.  Typical symptoms include low back pain, pelvic pain, nausea, and some dysuria.  She continues with her baseline symptoms of frequency, urgency, nocturia x2, and urge incontinence.  No flank pain or gross hematuria.  She was last treated for a UTI approximately 1 week ago.   Urine culture from 9/22 grew 30 K colonies of E. coli.   At her visit in 2/23, she reported suprapubic  discomfort, frequency, burning sensation in the bladder area, urgency, and incontinence. She reported symptoms of fecal incontinence.  Urine culture from 10/12/2021 grew >100 K E. coli.  She was treated with Macrobid and started on daily Macrobid. She was seen on 12/03/2021 with UTI symptoms.  A urine culture had been obtained through her PCP.  No results available.  She was being treated with Bactrim. She had run out of Myrbetriq and this was resumed. At her visit in 4/23, she continued on daily Macrodantin and Myrbetriq.  She had urinary frequency, urgency, and continued urge incontinence.  She felt like the Myrbetriq has improved her symptoms.  No side effects. Cystoscopy in 4/23 demonstrated squamous metaplasia and evidence of chronic cystitis.  Vaginal exam showed atrophic changes with a grade 2-3 cystocele. She was continued on Myrbetriq and daily Macrobid.  She returns today for follow-up.  Portions of the above documentation were copied from a prior visit for review purposes only.   Past Medical History:  Past Medical History:  Diagnosis Date   Acid reflux    Patient reports Barretts   Bulging lumbar disc    High cholesterol    Hypothyroidism    Nerve damage    Osteopenia    Pelvic pain in female    due to bladder sling    Past Surgical History:  Past Surgical History:  Procedure Laterality Date   APPENDECTOMY  1960   BIOPSY  11/03/2019   Procedure: BIOPSY;  Surgeon: Rogene Houston, MD;  [facility];  Service: Endoscopy;;   BLADDER SUSPENSION     CATARACT EXTRACTION W/PHACO Right 06/07/2019   Procedure: CATARACT EXTRACTION PHACO AND INTRAOCULAR LENS PLACEMENT (IOC);  Surgeon: Baruch Goldmann, MD;  Location: [facility];  Service: Ophthalmology;  Laterality: Right;  CDE: 10.87   CATARACT EXTRACTION W/PHACO Left 06/21/2019   Procedure: CATARACT EXTRACTION PHACO AND INTRAOCULAR LENS PLACEMENT (IOC);  Surgeon: Baruch Goldmann, MD;  Location: [facility];  Service:  Ophthalmology;  Laterality: Left;  CDE: 11.59   COLONOSCOPY N/A 11/03/2019   Procedure: COLONOSCOPY;  Surgeon: Rogene Houston, MD;  [facility];  Service: Endoscopy;  Laterality: N/A;  1245   ESOPHAGEAL DILATION N/A 05/27/2018   Procedure: ESOPHAGEAL DILATION;  Surgeon: Rogene Houston, MD;  [facility];  Service: Endoscopy;  Laterality: N/A;   ESOPHAGEAL DILATION N/A 02/12/2019   Procedure: ESOPHAGEAL DILATION;  Surgeon: Rogene Houston, MD;  [facility];  Service: Endoscopy;  Laterality: N/A;   ESOPHAGOGASTRODUODENOSCOPY N/A 12/21/2014   Procedure: ESOPHAGOGASTRODUODENOSCOPY (EGD);  Surgeon: Rogene Houston, MD;  [facility];  Service: Endoscopy;  Laterality: N/A;  210   ESOPHAGOGASTRODUODENOSCOPY N/A 06/16/2015   Procedure: ESOPHAGOGASTRODUODENOSCOPY (EGD);  Surgeon: Rogene Houston, MD;  [facility];  Service: Endoscopy;  Laterality: N/A;  1250   ESOPHAGOGASTRODUODENOSCOPY N/A 05/27/2018   Procedure: ESOPHAGOGASTRODUODENOSCOPY (EGD);  Surgeon: Rogene Houston, MD;  [facility];  Service: Endoscopy;  Laterality: N/A;  7:30   ESOPHAGOGASTRODUODENOSCOPY N/A 02/12/2019   Procedure: ESOPHAGOGASTRODUODENOSCOPY (EGD);  Surgeon: Rogene Houston, MD;  [facility];  Service: Endoscopy;  Laterality: N/A;    Allergies:  No Known Allergies  Family History:  Family History  Problem Relation Age of Onset   Breast cancer Mother    Heart disease Mother    Diabetes Mother    Cancer Mother        breast   Stroke Mother    Hypertension Mother    Lung cancer Father    Stroke Father    Cancer Father        oat cell cancer/lung   Kidney cancer Brother    Cancer Brother        renal cell cancer   Healthy Brother    Diabetes Son     Social History:  Social History   Tobacco Use   Smoking status: Never    Passive exposure: Never   Smokeless tobacco: Never  Vaping Use   Vaping Use: Never used  Substance Use  Topics   Alcohol use: No    Alcohol/week: 0.0 standard drinks   Drug use: No    ROS: Constitutional:  Negative for fever, chills, weight loss CV: Negative for chest pain, previous MI, hypertension Respiratory:  Negative for shortness of breath, wheezing, sleep apnea, frequent cough GI:  Negative for nausea, vomiting, bloody stool, GERD  Physical exam: There were no vitals taken for this visit. GENERAL APPEARANCE:  Well appearing, well developed, well nourished, NAD HEENT:  Atraumatic, normocephalic, oropharynx clear NECK:  Supple without lymphadenopathy or thyromegaly ABDOMEN:  Soft, non-tender, no masses EXTREMITIES:  Moves all extremities well, without clubbing, cyanosis, or edema NEUROLOGIC:  Alert and oriented x 3, normal gait, CN II-XII grossly intact MENTAL STATUS:  appropriate BACK:  Non-tender to palpation, No CVAT SKIN:  Warm, dry, and intact   Results: U/A:

## 2022-02-05 DIAGNOSIS — Z87440 Personal history of urinary (tract) infections: Secondary | ICD-10-CM | POA: Diagnosis not present

## 2022-02-05 DIAGNOSIS — E7849 Other hyperlipidemia: Secondary | ICD-10-CM | POA: Diagnosis not present

## 2022-02-05 DIAGNOSIS — R296 Repeated falls: Secondary | ICD-10-CM | POA: Diagnosis not present

## 2022-02-05 DIAGNOSIS — Z23 Encounter for immunization: Secondary | ICD-10-CM | POA: Diagnosis not present

## 2022-02-05 DIAGNOSIS — I10 Essential (primary) hypertension: Secondary | ICD-10-CM | POA: Diagnosis not present

## 2022-02-05 DIAGNOSIS — K219 Gastro-esophageal reflux disease without esophagitis: Secondary | ICD-10-CM | POA: Diagnosis not present

## 2022-02-05 DIAGNOSIS — R1013 Epigastric pain: Secondary | ICD-10-CM | POA: Diagnosis not present

## 2022-02-05 DIAGNOSIS — E039 Hypothyroidism, unspecified: Secondary | ICD-10-CM | POA: Diagnosis not present

## 2022-02-14 ENCOUNTER — Ambulatory Visit (HOSPITAL_BASED_OUTPATIENT_CLINIC_OR_DEPARTMENT_OTHER): Payer: Medicare HMO | Admitting: Certified Registered"

## 2022-02-14 ENCOUNTER — Ambulatory Visit (HOSPITAL_COMMUNITY): Payer: Medicare HMO | Admitting: Certified Registered"

## 2022-02-14 ENCOUNTER — Encounter (HOSPITAL_COMMUNITY): Payer: Self-pay | Admitting: Internal Medicine

## 2022-02-14 ENCOUNTER — Other Ambulatory Visit: Payer: Self-pay

## 2022-02-14 ENCOUNTER — Encounter (HOSPITAL_COMMUNITY)
Admission: RE | Disposition: A | Discharge status: Home / Self Care | Payer: Self-pay | Source: Home / Self Care | Attending: Internal Medicine

## 2022-02-14 ENCOUNTER — Ambulatory Visit (HOSPITAL_COMMUNITY)
Admission: RE | Admit: 2022-02-14 | Discharge: 2022-02-14 | Disposition: A | Discharge status: Home / Self Care | Payer: Medicare HMO | Attending: Internal Medicine | Admitting: Internal Medicine

## 2022-02-14 DIAGNOSIS — K219 Gastro-esophageal reflux disease without esophagitis: Secondary | ICD-10-CM | POA: Insufficient documentation

## 2022-02-14 DIAGNOSIS — R1314 Dysphagia, pharyngoesophageal phase: Secondary | ICD-10-CM

## 2022-02-14 DIAGNOSIS — E039 Hypothyroidism, unspecified: Secondary | ICD-10-CM | POA: Diagnosis not present

## 2022-02-14 DIAGNOSIS — Q394 Esophageal web: Secondary | ICD-10-CM | POA: Diagnosis not present

## 2022-02-14 DIAGNOSIS — Q399 Congenital malformation of esophagus, unspecified: Secondary | ICD-10-CM | POA: Diagnosis not present

## 2022-02-14 DIAGNOSIS — K2289 Other specified disease of esophagus: Secondary | ICD-10-CM | POA: Diagnosis not present

## 2022-02-14 HISTORY — PX: ESOPHAGEAL DILATION: SHX303

## 2022-02-14 HISTORY — PX: ESOPHAGOGASTRODUODENOSCOPY (EGD) WITH PROPOFOL: SHX5813

## 2022-02-14 SURGERY — ESOPHAGOGASTRODUODENOSCOPY (EGD) WITH PROPOFOL
Anesthesia: General

## 2022-02-14 MED ORDER — PROPOFOL 10 MG/ML IV BOLUS
INTRAVENOUS | Status: DC | PRN
  Administered 2022-02-14: 80 mg via INTRAVENOUS
  Administered 2022-02-14: 30 mg via INTRAVENOUS
  Administered 2022-02-14: 40 mg via INTRAVENOUS

## 2022-02-14 MED ORDER — LACTATED RINGERS IV SOLN: INTRAVENOUS | Status: DC

## 2022-02-14 MED ORDER — LIDOCAINE HCL (CARDIAC) PF 100 MG/5ML IV SOSY
PREFILLED_SYRINGE | INTRAVENOUS | Status: DC | PRN
  Administered 2022-02-14: 50 mg via INTRAVENOUS

## 2022-02-14 NOTE — Anesthesia Preprocedure Evaluation (Addendum)
Anesthesia Evaluation  Patient identified by MRN, date of birth, ID band Patient awake    Reviewed: Allergy & Precautions, H&P , NPO status , Patient's Chart, lab work & pertinent test results, reviewed documented beta blocker date and time   Airway Mallampati: II  TM Distance: >3 FB Neck ROM: full    Dental  (+) Dental Advisory Given, Chipped, Poor Dentition   Pulmonary neg pulmonary ROS,    Pulmonary exam normal breath sounds clear to auscultation       Cardiovascular Exercise Tolerance: Good negative cardio ROS   Rhythm:regular Rate:Normal     Neuro/Psych negative neurological ROS  negative psych ROS   GI/Hepatic Neg liver ROS, GERD  Medicated,  Endo/Other  Hypothyroidism   Renal/GU negative Renal ROS  negative genitourinary   Musculoskeletal   Abdominal   Peds  Hematology negative hematology ROS (+)   Anesthesia Other Findings   Reproductive/Obstetrics negative OB ROS                            Anesthesia Physical Anesthesia Plan  ASA: 2  Anesthesia Plan: General   Post-op Pain Management:    Induction:   PONV Risk Score and Plan: Propofol infusion  Airway Management Planned:   Additional Equipment:   Intra-op Plan:   Post-operative Plan:   Informed Consent: I have reviewed the patients History and Physical, chart, labs and discussed the procedure including the risks, benefits and alternatives for the proposed anesthesia with the patient or authorized representative who has indicated his/her understanding and acceptance.     Dental Advisory Given  Plan Discussed with: CRNA  Anesthesia Plan Comments:         Anesthesia Quick Evaluation

## 2022-02-14 NOTE — Anesthesia Procedure Notes (Signed)
Date/Time: 02/14/2022 9:50 AM  Performed by: Orlie Dakin, CRNAPre-anesthesia Checklist: Patient identified, Emergency Drugs available, Suction available and Patient being monitored Patient Re-evaluated:Patient Re-evaluated prior to induction Oxygen Delivery Method: Nasal cannula Induction Type: IV induction Placement Confirmation: positive ETCO2

## 2022-02-14 NOTE — H&P (Signed)
Terry Allen is an 81 y.o. female.   Chief Complaint: Patient is here for esophagogastroduodenoscopy with esophageal dilation. HPI: Patient is 81 year old Caucasian female who has chronic GERD and history of esophageal web which has been dilated on at least 3 occasions in the past most recently in 2020.  She presents with recurrent dysphagia to solids.  She points to suprasternal area as site of bolus obstruction.  She underwent barium pill esophagogram which confirmed recurrence of preventing passage of barium pill across it.  Patient states heartburn is well controlled.  She denies melena or rectal bleeding.  She has chronic retrosternal chest pain felt to be musculoskeletal.  She has been evaluated by cardiologist and pain felt to be noncardiac. Patient does not take aspirin or anticoagulants.  Past Medical History:  Diagnosis Date   Acid reflux    Patient reports Barretts   Bulging lumbar disc    High cholesterol    Hypothyroidism    Nerve damage    Osteopenia    Pelvic pain in female    due to bladder sling    Past Surgical History:  Procedure Laterality Date   APPENDECTOMY  1960   BIOPSY  11/03/2019   Procedure: BIOPSY;  Surgeon: Rogene Houston, MD;  [facility];  Service: Endoscopy;;   BLADDER SUSPENSION     CATARACT EXTRACTION W/PHACO Right 06/07/2019   Procedure: CATARACT EXTRACTION PHACO AND INTRAOCULAR LENS PLACEMENT ([place]);  Surgeon: Baruch Goldmann, MD;  Location: [facility];  Service: Ophthalmology;  Laterality: Right;  CDE: 10.87   CATARACT EXTRACTION W/PHACO Left 06/21/2019   Procedure: CATARACT EXTRACTION PHACO AND INTRAOCULAR LENS PLACEMENT (IOC);  Surgeon: Baruch Goldmann, MD;  Location: [facility];  Service: Ophthalmology;  Laterality: Left;  CDE: 11.59   COLONOSCOPY N/A 11/03/2019   Procedure: COLONOSCOPY;  Surgeon: Rogene Houston, MD;  [facility];  Service: Endoscopy;  Laterality: N/A;  1245   ESOPHAGEAL DILATION N/A 05/27/2018   Procedure:  ESOPHAGEAL DILATION;  Surgeon: Rogene Houston, MD;  [facility];  Service: Endoscopy;  Laterality: N/A;   ESOPHAGEAL DILATION N/A 02/12/2019   Procedure: ESOPHAGEAL DILATION;  Surgeon: Rogene Houston, MD;  [facility];  Service: Endoscopy;  Laterality: N/A;   ESOPHAGOGASTRODUODENOSCOPY N/A 12/21/2014   Procedure: ESOPHAGOGASTRODUODENOSCOPY (EGD);  Surgeon: Rogene Houston, MD;  [facility];  Service: Endoscopy;  Laterality: N/A;  210   ESOPHAGOGASTRODUODENOSCOPY N/A 06/16/2015   Procedure: ESOPHAGOGASTRODUODENOSCOPY (EGD);  Surgeon: Rogene Houston, MD;  [facility];  Service: Endoscopy;  Laterality: N/A;  1250   ESOPHAGOGASTRODUODENOSCOPY N/A 05/27/2018   Procedure: ESOPHAGOGASTRODUODENOSCOPY (EGD);  Surgeon: Rogene Houston, MD;  [facility];  Service: Endoscopy;  Laterality: N/A;  7:30   ESOPHAGOGASTRODUODENOSCOPY N/A 02/12/2019   Procedure: ESOPHAGOGASTRODUODENOSCOPY (EGD);  Surgeon: Rogene Houston, MD;  [facility];  Service: Endoscopy;  Laterality: N/A;    Family History  Problem Relation Age of Onset   Breast cancer Mother    Heart disease Mother    Diabetes Mother    Cancer Mother        breast   Stroke Mother    Hypertension Mother    Lung cancer Father    Stroke Father    Cancer Father        oat cell cancer/lung   Kidney cancer Brother    Cancer Brother        renal cell cancer   Healthy Brother  Diabetes Son    Social History:  reports that she has never smoked. She has never been exposed to tobacco smoke. She has never used smokeless tobacco. She reports that she does not drink alcohol and does not use drugs.  Allergies: No Known Allergies  Medications Prior to Admission  Medication Sig Dispense Refill   amitriptyline (ELAVIL) 50 MG tablet Take 50 mg by mouth at bedtime.   2   atorvastatin (LIPITOR) 40 MG tablet Take 40 mg by mouth at bedtime.      Calcium Carb-Cholecalciferol (CALCIUM 600 +  D PO) Take 1 tablet by mouth 2 (two) times a day.     cholecalciferol (VITAMIN D3) 25 MCG (1000 UNIT) tablet Take 1,000 Units by mouth daily.     gabapentin (NEURONTIN) 300 MG capsule Take 2 capsules (600 mg total) by mouth 3 (three) times daily. (Patient taking differently: Take 300 mg by mouth 3 (three) times daily.) 540 capsule 4   levothyroxine (SYNTHROID) 75 MCG tablet Take 75 mcg by mouth daily before breakfast.     meloxicam (MOBIC) 15 MG tablet Take 15 mg by mouth daily.     mirabegron ER (MYRBETRIQ) 50 MG TB24 tablet Take 1 tablet (50 mg total) by mouth daily. 28 tablet 0   omeprazole (PRILOSEC) 40 MG capsule Take 40 mg by mouth daily.     esomeprazole (NEXIUM) 40 MG capsule Take 1 capsule (40 mg total) by mouth daily before breakfast. (Patient not taking: Reported on 02/13/2022) 30 capsule 5   nitrofurantoin, macrocrystal-monohydrate, (MACROBID) 100 MG capsule Take 1 capsule (100 mg total) by mouth daily. (Patient not taking: Reported on 02/13/2022) 30 capsule 2    No results found for this or any previous visit (from the past 48 hour(s)). No results found.  Review of Systems  Blood pressure 135/65, pulse 68, temperature 97.7 F (36.5 C), temperature source Oral, resp. rate 19, height '5\' 2"'$  (1.575 m), weight 49.9 kg, SpO2 100 %. Physical Exam HENT:     Mouth/Throat:     Mouth: Mucous membranes are moist.     Pharynx: Oropharynx is clear.  Eyes:     General: No scleral icterus.    Conjunctiva/sclera: Conjunctivae normal.  Cardiovascular:     Rate and Rhythm: Normal rate and regular rhythm.     Heart sounds: Normal heart sounds. No murmur heard. Pulmonary:     Effort: Pulmonary effort is normal.     Breath sounds: Normal breath sounds.  Abdominal:     General: Abdomen is flat. There is no distension.     Palpations: Abdomen is soft. There is no mass.     Tenderness: There is no abdominal tenderness.  Musculoskeletal:        General: No swelling.     Cervical back: Neck  supple.  Lymphadenopathy:     Cervical: No cervical adenopathy.  Skin:    General: Skin is warm and dry.  Neurological:     Mental Status: She is alert.      Assessment/Plan  Dysphagia secondary to esophageal web Esophagogastroduodenoscopy with esophageal dilation  Hildred Laser, MD 02/14/2022, 9:38 AM

## 2022-02-14 NOTE — Transfer of Care (Signed)
Immediate Anesthesia Transfer of Care Note  Patient: Terry Allen  Procedure(s) Performed: ESOPHAGOGASTRODUODENOSCOPY (EGD) WITH PROPOFOL ESOPHAGEAL DILATION  Patient Location: Endoscopy Unit  Anesthesia Type:General  Level of Consciousness: drowsy  Airway & Oxygen Therapy: Patient Spontanous Breathing  Post-op Assessment: Report given to RN and Post -op Vital signs reviewed and stable  Post vital signs: Reviewed and stable  Last Vitals:  Vitals Value Taken Time  BP    Temp    Pulse    Resp    SpO2      Last Pain:  Vitals:   02/14/22 0941  TempSrc:   PainSc: 6       Patients Stated Pain Goal: 7 (94/07/68 0881)  Complications: No notable events documented.

## 2022-02-14 NOTE — Discharge Instructions (Signed)
No aspirin NSAIDs or meloxicam for 3 days.  Can take Tylenol up to 2 g/day as needed in divided doses.. Resume other medications as before Mechanical soft diet No driving for 24 hours. Please call office with progress report in 1 week.

## 2022-02-14 NOTE — Op Note (Signed)
[facility] Patient Name: Terry Allen Procedure Date: 02/14/2022 9:25 AM MRN: [id number] Date of Birth: 1941/05/12 Attending MD: Hildred Laser , MD CSN: [id number] Age: 81 Admit Type: Outpatient Procedure:                Upper GI endoscopy Indications:              Esophageal dysphagia Providers:                Hildred Laser, MD, Lambert Mody, Rosina Lowenstein,                            RN Referring MD:             [facility] Medicines:                Propofol per Anesthesia Complications:            No immediate complications. Estimated Blood Loss:     Estimated blood loss was minimal. Procedure:                Pre-Anesthesia Assessment:                           - Prior to the procedure, a History and Physical                            was performed, and patient medications and                            allergies were reviewed. The patient's tolerance of                            previous anesthesia was also reviewed. The risks                            and benefits of the procedure and the sedation                            options and risks were discussed with the patient.                            All questions were answered, and informed consent                            was obtained. Prior Anticoagulants: The patient has                            taken no previous anticoagulant or antiplatelet                            agents except for NSAID medication. ASA Grade                            Assessment: II - A patient with mild systemic  disease. After reviewing the risks and benefits,                            the patient was deemed in satisfactory condition to                            undergo the procedure.                           After obtaining informed consent, the endoscope was                            passed under direct vision. Throughout the                            procedure, the patient's blood  pressure, pulse, and                            oxygen saturations were monitored continuously. The                            [id number]-[id number] ([id number]) scope was introduced through the                            mouth, and advanced to the second part of duodenum.                            The upper GI endoscopy was accomplished without                            difficulty. The patient tolerated the procedure                            well. Scope In: 9:46:03 AM Scope Out: 9:53:21 AM Total Procedure Duration: 0 hours 7 minutes 18 seconds  Findings:      The hypopharynx was normal.      A web was found in the proximal esophagus. The scope was withdrawn.       Dilation was performed with a Maloney dilator with moderate resistance       at 44 Fr and 46 Fr. The dilation site was examined following endoscope       reinsertion and showed moderate mucosal disruption, moderate improvement       in luminal narrowing and no perforation.      The exam of the esophagus was otherwise normal.      The Z-line was irregular and was found 36 cm from the incisors.      The entire examined stomach was normal.      The duodenal bulb and second portion of the duodenum were normal. Impression:               - Normal hypopharynx.                           -High-grade esophageal web which was initially  disrupted with the scope and subsequently dilated                            with 44 and 46 [place] Maloney dilators.                           - Z-line irregular, 36 cm from the incisors.                           - Normal stomach.                           - Normal duodenal bulb and second portion of the                            duodenum.                           - No specimens collected. Moderate Sedation:      Per Anesthesia Care Recommendation:           - Patient has a contact number available for                            emergencies. The signs and symptoms of potential                             delayed complications were discussed with the                            patient. Return to normal activities tomorrow.                            Written discharge instructions were provided to the                            patient.                           - Mechanical soft diet today.                           - Continue present medications.                           - No aspirin, ibuprofen, naproxen, or other                            non-steroidal anti-inflammatory drugs for 3 days.                           - Repeat upper endoscopy PRN. Procedure Code(s):        --- Professional ---                           [phone], Esophagogastroduodenoscopy, flexible,  transoral; diagnostic, including collection of                            specimen(s) by brushing or washing, when performed                            (separate procedure)                           43450, Dilation of esophagus, by unguided sound or                            bougie, single or multiple passes Diagnosis Code(s):        --- Professional ---                           Q39.4, Esophageal web                           K22.8, Other specified diseases of esophagus                           R13.14, Dysphagia, pharyngoesophageal phase CPT copyright 2019 American Medical Association. All rights reserved. The codes documented in this report are preliminary and upon coder review may  be revised to meet current compliance requirements. Hildred Laser, MD Hildred Laser, MD 02/14/2022 10:08:49 AM This report has been signed electronically. Number of Addenda: 0

## 2022-02-14 NOTE — Anesthesia Postprocedure Evaluation (Signed)
Anesthesia Post Note  Patient: Terry Allen  Procedure(s) Performed: ESOPHAGOGASTRODUODENOSCOPY (EGD) WITH PROPOFOL ESOPHAGEAL DILATION  Patient location during evaluation: Phase II Anesthesia Type: General Level of consciousness: awake Pain management: pain level controlled Vital Signs Assessment: post-procedure vital signs reviewed and stable Respiratory status: spontaneous breathing and respiratory function stable Cardiovascular status: blood pressure returned to baseline and stable Postop Assessment: no headache and no apparent nausea or vomiting Anesthetic complications: no Comments: Late entry   No notable events documented.   Last Vitals:  Vitals:   02/14/22 0757 02/14/22 0957  BP: 135/65 (!) 121/49  Pulse: 68   Resp: 19 11  Temp: 36.5 C 36.5 C  SpO2: 100% 100%    Last Pain:  Vitals:   02/14/22 0957  TempSrc: Oral  PainSc:                  Louann Sjogren

## 2022-02-14 NOTE — H&P (View-Only) (Signed)
Terry Allen is an 81 y.o. female.   Chief Complaint: Patient is here for esophagogastroduodenoscopy with esophageal dilation. HPI: Patient is 81 year old Caucasian female who has chronic GERD and history of esophageal web which has been dilated on at least 3 occasions in the past most recently in 2020.  She presents with recurrent dysphagia to solids.  She points to suprasternal area as site of bolus obstruction.  She underwent barium pill esophagogram which confirmed recurrence of preventing passage of barium pill across it.  Patient states heartburn is well controlled.  She denies melena or rectal bleeding.  She has chronic retrosternal chest pain felt to be musculoskeletal.  She has been evaluated by cardiologist and pain felt to be noncardiac. Patient does not take aspirin or anticoagulants.  Past Medical History:  Diagnosis Date   Acid reflux    Patient reports Barretts   Bulging lumbar disc    High cholesterol    Hypothyroidism    Nerve damage    Osteopenia    Pelvic pain in female    due to bladder sling    Past Surgical History:  Procedure Laterality Date   APPENDECTOMY  1960   BIOPSY  11/03/2019   Procedure: BIOPSY;  Surgeon: Rogene Houston, MD;  [facility];  Service: Endoscopy;;   BLADDER SUSPENSION     CATARACT EXTRACTION W/PHACO Right 06/07/2019   Procedure: CATARACT EXTRACTION PHACO AND INTRAOCULAR LENS PLACEMENT ([place]);  Surgeon: Baruch Goldmann, MD;  Location: [facility];  Service: Ophthalmology;  Laterality: Right;  CDE: 10.87   CATARACT EXTRACTION W/PHACO Left 06/21/2019   Procedure: CATARACT EXTRACTION PHACO AND INTRAOCULAR LENS PLACEMENT ([facility]);  Surgeon: Baruch Goldmann, MD;  Location: [facility];  Service: Ophthalmology;  Laterality: Left;  CDE: 11.59   COLONOSCOPY N/A 11/03/2019   Procedure: COLONOSCOPY;  Surgeon: Rogene Houston, MD;  [facility]: [facility];  Service: Endoscopy;  Laterality: N/A;  1245   ESOPHAGEAL DILATION N/A 05/27/2018   Procedure:  ESOPHAGEAL DILATION;  Surgeon: Rogene Houston, MD;  [facility]: [facility];  Service: Endoscopy;  Laterality: N/A;   ESOPHAGEAL DILATION N/A 02/12/2019   Procedure: ESOPHAGEAL DILATION;  Surgeon: Rogene Houston, MD;  [facility];  Service: Endoscopy;  Laterality: N/A;   ESOPHAGOGASTRODUODENOSCOPY N/A 12/21/2014   Procedure: ESOPHAGOGASTRODUODENOSCOPY (EGD);  Surgeon: Rogene Houston, MD;  [facility];  Service: Endoscopy;  Laterality: N/A;  210   ESOPHAGOGASTRODUODENOSCOPY N/A 06/16/2015   Procedure: ESOPHAGOGASTRODUODENOSCOPY (EGD);  Surgeon: Rogene Houston, MD;  [facility];  Service: Endoscopy;  Laterality: N/A;  1250   ESOPHAGOGASTRODUODENOSCOPY N/A 05/27/2018   Procedure: ESOPHAGOGASTRODUODENOSCOPY (EGD);  Surgeon: Rogene Houston, MD;  [facility];  Service: Endoscopy;  Laterality: N/A;  7:30   ESOPHAGOGASTRODUODENOSCOPY N/A 02/12/2019   Procedure: ESOPHAGOGASTRODUODENOSCOPY (EGD);  Surgeon: Rogene Houston, MD;  [facility];  Service: Endoscopy;  Laterality: N/A;    Family History  Problem Relation Age of Onset   Breast cancer Mother    Heart disease Mother    Diabetes Mother    Cancer Mother        breast   Stroke Mother    Hypertension Mother    Lung cancer Father    Stroke Father    Cancer Father        oat cell cancer/lung   Kidney cancer Brother    Cancer Brother        renal cell cancer   Healthy Brother  Diabetes Son    Social History:  reports that she has never smoked. She has never been exposed to tobacco smoke. She has never used smokeless tobacco. She reports that she does not drink alcohol and does not use drugs.  Allergies: No Known Allergies  Medications Prior to Admission  Medication Sig Dispense Refill   amitriptyline (ELAVIL) 50 MG tablet Take 50 mg by mouth at bedtime.   2   atorvastatin (LIPITOR) 40 MG tablet Take 40 mg by mouth at bedtime.      Calcium Carb-Cholecalciferol (CALCIUM 600 +  D PO) Take 1 tablet by mouth 2 (two) times a day.     cholecalciferol (VITAMIN D3) 25 MCG (1000 UNIT) tablet Take 1,000 Units by mouth daily.     gabapentin (NEURONTIN) 300 MG capsule Take 2 capsules (600 mg total) by mouth 3 (three) times daily. (Patient taking differently: Take 300 mg by mouth 3 (three) times daily.) 540 capsule 4   levothyroxine (SYNTHROID) 75 MCG tablet Take 75 mcg by mouth daily before breakfast.     meloxicam (MOBIC) 15 MG tablet Take 15 mg by mouth daily.     mirabegron ER (MYRBETRIQ) 50 MG TB24 tablet Take 1 tablet (50 mg total) by mouth daily. 28 tablet 0   omeprazole (PRILOSEC) 40 MG capsule Take 40 mg by mouth daily.     esomeprazole (NEXIUM) 40 MG capsule Take 1 capsule (40 mg total) by mouth daily before breakfast. (Patient not taking: Reported on 02/13/2022) 30 capsule 5   nitrofurantoin, macrocrystal-monohydrate, (MACROBID) 100 MG capsule Take 1 capsule (100 mg total) by mouth daily. (Patient not taking: Reported on 02/13/2022) 30 capsule 2    No results found for this or any previous visit (from the past 48 hour(s)). No results found.  Review of Systems  Blood pressure 135/65, pulse 68, temperature 97.7 F (36.5 C), temperature source Oral, resp. rate 19, height '5\' 2"'$  (1.575 m), weight 49.9 kg, SpO2 100 %. Physical Exam HENT:     Mouth/Throat:     Mouth: Mucous membranes are moist.     Pharynx: Oropharynx is clear.  Eyes:     General: No scleral icterus.    Conjunctiva/sclera: Conjunctivae normal.  Cardiovascular:     Rate and Rhythm: Normal rate and regular rhythm.     Heart sounds: Normal heart sounds. No murmur heard. Pulmonary:     Effort: Pulmonary effort is normal.     Breath sounds: Normal breath sounds.  Abdominal:     General: Abdomen is flat. There is no distension.     Palpations: Abdomen is soft. There is no mass.     Tenderness: There is no abdominal tenderness.  Musculoskeletal:        General: No swelling.     Cervical back: Neck  supple.  Lymphadenopathy:     Cervical: No cervical adenopathy.  Skin:    General: Skin is warm and dry.  Neurological:     Mental Status: She is alert.      Assessment/Plan  Dysphagia secondary to esophageal web Esophagogastroduodenoscopy with esophageal dilation  Hildred Laser, MD 02/14/2022, 9:38 AM

## 2022-02-19 ENCOUNTER — Telehealth (INDEPENDENT_AMBULATORY_CARE_PROVIDER_SITE_OTHER): Payer: Self-pay | Admitting: *Deleted

## 2022-02-19 NOTE — Telephone Encounter (Signed)
Patient had EGD on 02/14/22. Called to give update. States she is about the same as before the procedure. Sore throat started after EGD and got worse today. Nausea started today. Chest pain and Coughing when eating but not every time. Only ate a baked potatoe today. Not eating much.

## 2022-02-20 NOTE — Telephone Encounter (Signed)
Dr. Laural Golden told me yesterday he was going to call and speak with patient.

## 2022-02-21 ENCOUNTER — Encounter (HOSPITAL_COMMUNITY): Payer: Self-pay | Admitting: Internal Medicine

## 2022-02-22 ENCOUNTER — Other Ambulatory Visit (INDEPENDENT_AMBULATORY_CARE_PROVIDER_SITE_OTHER): Payer: Self-pay

## 2022-02-22 ENCOUNTER — Encounter (INDEPENDENT_AMBULATORY_CARE_PROVIDER_SITE_OTHER): Payer: Self-pay

## 2022-02-22 DIAGNOSIS — R0789 Other chest pain: Secondary | ICD-10-CM

## 2022-02-22 DIAGNOSIS — Q394 Esophageal web: Secondary | ICD-10-CM

## 2022-02-22 DIAGNOSIS — R1319 Other dysphagia: Secondary | ICD-10-CM

## 2022-02-22 DIAGNOSIS — R110 Nausea: Secondary | ICD-10-CM

## 2022-02-22 NOTE — Telephone Encounter (Signed)
Per Dr. Laural Golden - patient needs to have her esophagus dilated again. He Would like it done next week with Dr. Jenetta Downer. States he only dilated to 46 fr because it was high grade. Dr. Laural Golden tried calling patient several times with no answer. I was able to get in touch with patient and discussed with her. She is wanting to proceed with procedure next week. Leighann Please schedule.

## 2022-02-22 NOTE — Telephone Encounter (Signed)
Thanks

## 2022-02-22 NOTE — Telephone Encounter (Signed)
Can we try to schedule her case on 6/21 at 1:15 PM? I would be done with my meeting at that time.

## 2022-02-25 ENCOUNTER — Encounter (HOSPITAL_COMMUNITY): Payer: Self-pay

## 2022-02-25 ENCOUNTER — Other Ambulatory Visit: Payer: Self-pay

## 2022-02-25 ENCOUNTER — Encounter (HOSPITAL_COMMUNITY)
Admission: RE | Admit: 2022-02-25 | Discharge: 2022-02-25 | Disposition: A | Discharge status: Home / Self Care | Payer: Medicare HMO | Source: Ambulatory Visit | Attending: Gastroenterology | Admitting: Gastroenterology

## 2022-02-25 NOTE — Pre-Procedure Instructions (Signed)
Attempted pre-op phone call and she has no voicemail set up.

## 2022-02-27 ENCOUNTER — Other Ambulatory Visit: Payer: Self-pay

## 2022-02-27 ENCOUNTER — Ambulatory Visit (HOSPITAL_BASED_OUTPATIENT_CLINIC_OR_DEPARTMENT_OTHER): Payer: Medicare HMO | Admitting: Anesthesiology

## 2022-02-27 ENCOUNTER — Encounter (HOSPITAL_COMMUNITY): Payer: Self-pay | Admitting: Gastroenterology

## 2022-02-27 ENCOUNTER — Ambulatory Visit (HOSPITAL_COMMUNITY): Payer: Medicare HMO | Admitting: Anesthesiology

## 2022-02-27 ENCOUNTER — Encounter (HOSPITAL_COMMUNITY)
Admission: RE | Disposition: A | Discharge status: Home / Self Care | Payer: Self-pay | Source: Home / Self Care | Attending: Gastroenterology

## 2022-02-27 ENCOUNTER — Ambulatory Visit (HOSPITAL_COMMUNITY)
Admission: RE | Admit: 2022-02-27 | Discharge: 2022-02-27 | Disposition: A | Discharge status: Home / Self Care | Payer: Medicare HMO | Attending: Gastroenterology | Admitting: Gastroenterology

## 2022-02-27 DIAGNOSIS — R072 Precordial pain: Secondary | ICD-10-CM | POA: Diagnosis not present

## 2022-02-27 DIAGNOSIS — Q394 Esophageal web: Secondary | ICD-10-CM | POA: Insufficient documentation

## 2022-02-27 DIAGNOSIS — N390 Urinary tract infection, site not specified: Secondary | ICD-10-CM

## 2022-02-27 DIAGNOSIS — R1310 Dysphagia, unspecified: Secondary | ICD-10-CM | POA: Diagnosis not present

## 2022-02-27 DIAGNOSIS — K219 Gastro-esophageal reflux disease without esophagitis: Secondary | ICD-10-CM | POA: Diagnosis not present

## 2022-02-27 DIAGNOSIS — G8929 Other chronic pain: Secondary | ICD-10-CM

## 2022-02-27 DIAGNOSIS — Z1211 Encounter for screening for malignant neoplasm of colon: Secondary | ICD-10-CM

## 2022-02-27 DIAGNOSIS — R110 Nausea: Secondary | ICD-10-CM

## 2022-02-27 DIAGNOSIS — R079 Chest pain, unspecified: Secondary | ICD-10-CM

## 2022-02-27 DIAGNOSIS — N3941 Urge incontinence: Secondary | ICD-10-CM

## 2022-02-27 DIAGNOSIS — E039 Hypothyroidism, unspecified: Secondary | ICD-10-CM | POA: Diagnosis not present

## 2022-02-27 DIAGNOSIS — K2270 Barrett's esophagus without dysplasia: Secondary | ICD-10-CM

## 2022-02-27 DIAGNOSIS — R1319 Other dysphagia: Secondary | ICD-10-CM

## 2022-02-27 DIAGNOSIS — E7800 Pure hypercholesterolemia, unspecified: Secondary | ICD-10-CM

## 2022-02-27 DIAGNOSIS — R0789 Other chest pain: Secondary | ICD-10-CM

## 2022-02-27 HISTORY — PX: ESOPHAGEAL DILATION: SHX303

## 2022-02-27 HISTORY — PX: ESOPHAGOGASTRODUODENOSCOPY (EGD) WITH PROPOFOL: SHX5813

## 2022-02-27 SURGERY — ESOPHAGOGASTRODUODENOSCOPY (EGD) WITH PROPOFOL
Anesthesia: General

## 2022-02-27 MED ORDER — LIDOCAINE HCL (CARDIAC) PF 100 MG/5ML IV SOSY
PREFILLED_SYRINGE | INTRAVENOUS | Status: DC | PRN
  Administered 2022-02-27: 50 mg via INTRAVENOUS

## 2022-02-27 MED ORDER — PROPOFOL 10 MG/ML IV BOLUS
INTRAVENOUS | Status: DC | PRN
  Administered 2022-02-27: 100 mg via INTRAVENOUS

## 2022-02-27 MED ORDER — LACTATED RINGERS IV SOLN: INTRAVENOUS | Status: DC

## 2022-02-27 NOTE — Op Note (Signed)
[facility] Patient Name: Terry Allen Procedure Date: 02/27/2022 12:01 PM MRN: [id number] Date of Birth: 1940/10/05 Attending MD: [name] ,  CSN: [id number] Age: 81 Admit Type: Outpatient Procedure:                Upper GI endoscopy Indications:              Dysphagia, history of esophageal web Providers:                [name], Janeece Riggers, RN, Caprice Kluver Referring MD:              Medicines:                Monitored Anesthesia Care Complications:            No immediate complications. Estimated Blood Loss:     Estimated blood loss: none. Procedure:                Pre-Anesthesia Assessment:                           - Prior to the procedure, a History and Physical                            was performed, and patient medications, allergies                            and sensitivities were reviewed. The patient's                            tolerance of previous anesthesia was reviewed.                           - The risks and benefits of the procedure and the                            sedation options and risks were discussed with the                            patient. All questions were answered and informed                            consent was obtained.                           - ASA Grade Assessment: III - A patient with severe                            systemic disease.                           After obtaining informed consent, the endoscope was                            passed under direct vision. Throughout the                            procedure, the patient's  blood pressure, pulse, and                            oxygen saturations were monitored continuously. The                            [id number]-[id number] ([id number]) scope was introduced through the                            mouth, and advanced to the second part of duodenum.                            The upper GI endoscopy was accomplished without                            difficulty. The  patient tolerated the procedure                            well. Scope In: 12:58:48 PM Scope Out: 1:05:26 PM Total Procedure Duration: 0 hours 6 minutes 38 seconds  Findings:      A web was found in the upper third of the esophagus. A guidewire was       placed and the scope was withdrawn. Dilation was performed with a Savary       dilator with moderate resistance at 16 mm.      The exam of the esophagus was otherwise normal.      The stomach was normal.      The examined duodenum was normal. Impression:               - Web in the upper third of the esophagus. Dilated.                           - Normal stomach.                           - Normal examined duodenum.                           - No specimens collected. Moderate Sedation:      Per Anesthesia Care Recommendation:           - Discharge patient to home (ambulatory).                           - Resume previous diet.                           - Repeat upper endoscopy in 6 weeks to assess                            disease activity.                           - Continue present medications. Procedure Code(s):        --- Professional ---  43248, Esophagogastroduodenoscopy, flexible,                            transoral; with insertion of guide wire followed by                            passage of dilator(s) through esophagus over guide                            wire Diagnosis Code(s):        --- Professional ---                           Q39.4, Esophageal web                           R13.10, Dysphagia, unspecified CPT copyright 2019 American Medical Association. All rights reserved. The codes documented in this report are preliminary and upon coder review may  be revised to meet current compliance requirements. [name], MD [name],  02/27/2022 1:11:06 PM This report has been signed electronically. Number of Addenda: 0

## 2022-02-27 NOTE — Anesthesia Procedure Notes (Signed)
Date/Time: 02/27/2022 12:59 PM  Performed by: Orlie Dakin, CRNAPre-anesthesia Checklist: Patient identified, Emergency Drugs available, Suction available and Patient being monitored Patient Re-evaluated:Patient Re-evaluated prior to induction Oxygen Delivery Method: Nasal cannula Induction Type: IV induction Placement Confirmation: positive ETCO2

## 2022-02-27 NOTE — Discharge Instructions (Signed)
You are being discharged to home.  Resume your previous diet.  Your physician has recommended a repeat upper endoscopy in six weeks to assess disease activity.

## 2022-02-27 NOTE — Interval H&P Note (Signed)
History and Physical Interval Note:  02/27/2022 12:12 PM  Terry Allen  has presented today for surgery, with the diagnosis of Dysphagia Nausea chest pain.  The various methods of treatment have been discussed with the patient and family. After consideration of risks, benefits and other options for treatment, the patient has consented to  Procedure(s) with comments: ESOPHAGOGASTRODUODENOSCOPY (EGD) WITH PROPOFOL (N/A) - 1:15 Per Dr Jenetta Downer asa 1 [place] (N/A) as a surgical intervention.  The patient's history has been reviewed, patient examined, no change in status, stable for surgery.  I have reviewed the patient's chart and labs.  Questions were answered to the patient's satisfaction.     [name] Mayorga

## 2022-02-27 NOTE — Anesthesia Preprocedure Evaluation (Addendum)
Anesthesia Evaluation  Patient identified by MRN, date of birth, ID band Patient awake    Reviewed: Allergy & Precautions, NPO status , Patient's Chart, lab work & pertinent test results  Airway Mallampati: III  TM Distance: >3 FB Neck ROM: Full  Mouth opening: Limited Mouth Opening  Dental  (+) Chipped, Missing, Poor Dentition, Dental Advisory Given   Pulmonary neg pulmonary ROS,    Pulmonary exam normal breath sounds clear to auscultation       Cardiovascular Exercise Tolerance: Good negative cardio ROS Normal cardiovascular exam Rhythm:Regular Rate:Normal     Neuro/Psych negative neurological ROS  negative psych ROS   GI/Hepatic Neg liver ROS, GERD  Medicated and Controlled,  Endo/Other  Hypothyroidism   Renal/GU negative Renal ROS  negative genitourinary   Musculoskeletal negative musculoskeletal ROS (+)   Abdominal   Peds negative pediatric ROS (+)  Hematology negative hematology ROS (+)   Anesthesia Other Findings Bulging lumbar disc  Reproductive/Obstetrics negative OB ROS                            Anesthesia Physical Anesthesia Plan  ASA: 2  Anesthesia Plan: General   Post-op Pain Management: Minimal or no pain anticipated   Induction: Intravenous  PONV Risk Score and Plan: Propofol infusion  Airway Management Planned: Nasal Cannula and Natural Airway  Additional Equipment:   Intra-op Plan:   Post-operative Plan:   Informed Consent: I have reviewed the patients History and Physical, chart, labs and discussed the procedure including the risks, benefits and alternatives for the proposed anesthesia with the patient or authorized representative who has indicated his/her understanding and acceptance.     Dental advisory given  Plan Discussed with: CRNA and Surgeon  Anesthesia Plan Comments:         Anesthesia Quick Evaluation

## 2022-02-27 NOTE — Anesthesia Postprocedure Evaluation (Signed)
Anesthesia Post Note  Patient: Terry Allen  Procedure(s) Performed: ESOPHAGOGASTRODUODENOSCOPY (EGD) WITH PROPOFOL ESOPHAGEAL DILATION  Patient location during evaluation: Phase II Anesthesia Type: General Level of consciousness: awake and alert and oriented Pain management: pain level controlled Vital Signs Assessment: post-procedure vital signs reviewed and stable Respiratory status: spontaneous breathing, nonlabored ventilation and respiratory function stable Cardiovascular status: blood pressure returned to baseline and stable Postop Assessment: no apparent nausea or vomiting Anesthetic complications: no   No notable events documented.   Last Vitals:  Vitals:   02/27/22 1156 02/27/22 1312  BP: 101/64 (!) 111/49  Pulse: 67 72  Resp: 17 16  Temp: 36.8 C (!) 36.4 C  SpO2: 100% 100%    Last Pain:  Vitals:   02/27/22 1312  TempSrc: Axillary  PainSc:                  Nicolas Sisler C [name]

## 2022-02-27 NOTE — Transfer of Care (Signed)
Immediate Anesthesia Transfer of Care Note  Patient: Terry Allen  Procedure(s) Performed: ESOPHAGOGASTRODUODENOSCOPY (EGD) WITH PROPOFOL ESOPHAGEAL DILATION  Patient Location: PACU  Anesthesia Type:MAC  Level of Consciousness: drowsy  Airway & Oxygen Therapy: Patient Spontanous Breathing and Patient connected to nasal cannula oxygen  Post-op Assessment: Report given to RN and Post -op Vital signs reviewed and stable  Post vital signs: Reviewed and stable  Last Vitals:  Vitals Value Taken Time  BP    Temp    Pulse    Resp    SpO2      Last Pain:  Vitals:   02/27/22 1258  TempSrc:   PainSc: 5       Patients Stated Pain Goal: 7 (97/53/00 5110)  Complications: No notable events documented.

## 2022-02-28 DIAGNOSIS — H9319 Tinnitus, unspecified ear: Secondary | ICD-10-CM | POA: Diagnosis not present

## 2022-02-28 DIAGNOSIS — R296 Repeated falls: Secondary | ICD-10-CM | POA: Diagnosis not present

## 2022-03-06 ENCOUNTER — Encounter (HOSPITAL_COMMUNITY): Payer: Self-pay | Admitting: Gastroenterology

## 2022-03-15 DIAGNOSIS — E039 Hypothyroidism, unspecified: Secondary | ICD-10-CM | POA: Diagnosis not present

## 2022-03-20 DIAGNOSIS — R300 Dysuria: Secondary | ICD-10-CM | POA: Diagnosis not present

## 2022-03-20 DIAGNOSIS — W57XXXA Bitten or stung by nonvenomous insect and other nonvenomous arthropods, initial encounter: Secondary | ICD-10-CM | POA: Diagnosis not present

## 2022-03-20 DIAGNOSIS — R030 Elevated blood-pressure reading, without diagnosis of hypertension: Secondary | ICD-10-CM | POA: Diagnosis not present

## 2022-03-20 DIAGNOSIS — N390 Urinary tract infection, site not specified: Secondary | ICD-10-CM | POA: Diagnosis not present

## 2022-03-20 DIAGNOSIS — R350 Frequency of micturition: Secondary | ICD-10-CM | POA: Diagnosis not present

## 2022-03-20 DIAGNOSIS — Z681 Body mass index (BMI) 19 or less, adult: Secondary | ICD-10-CM | POA: Diagnosis not present

## 2022-03-20 DIAGNOSIS — R296 Repeated falls: Secondary | ICD-10-CM | POA: Diagnosis not present

## 2022-03-30 ENCOUNTER — Other Ambulatory Visit: Payer: Self-pay

## 2022-03-30 ENCOUNTER — Encounter (HOSPITAL_COMMUNITY): Payer: Self-pay

## 2022-03-30 ENCOUNTER — Inpatient Hospital Stay (HOSPITAL_COMMUNITY)
Admission: EM | Admit: 2022-03-30 | Discharge: 2022-04-01 | DRG: 918 | Disposition: A | Discharge status: Home / Self Care | Payer: Medicare HMO | Attending: Family Medicine | Admitting: Family Medicine

## 2022-03-30 DIAGNOSIS — W5911XA Bitten by nonvenomous snake, initial encounter: Secondary | ICD-10-CM | POA: Diagnosis present

## 2022-03-30 DIAGNOSIS — K2270 Barrett's esophagus without dysplasia: Secondary | ICD-10-CM | POA: Diagnosis present

## 2022-03-30 DIAGNOSIS — Z961 Presence of intraocular lens: Secondary | ICD-10-CM | POA: Diagnosis present

## 2022-03-30 DIAGNOSIS — R1314 Dysphagia, pharyngoesophageal phase: Secondary | ICD-10-CM | POA: Diagnosis present

## 2022-03-30 DIAGNOSIS — W5911XD Bitten by nonvenomous snake, subsequent encounter: Secondary | ICD-10-CM | POA: Diagnosis not present

## 2022-03-30 DIAGNOSIS — Z79899 Other long term (current) drug therapy: Secondary | ICD-10-CM

## 2022-03-30 DIAGNOSIS — R3915 Urgency of urination: Secondary | ICD-10-CM | POA: Diagnosis present

## 2022-03-30 DIAGNOSIS — K219 Gastro-esophageal reflux disease without esophagitis: Secondary | ICD-10-CM | POA: Diagnosis present

## 2022-03-30 DIAGNOSIS — E876 Hypokalemia: Secondary | ICD-10-CM | POA: Diagnosis present

## 2022-03-30 DIAGNOSIS — L03011 Cellulitis of right finger: Secondary | ICD-10-CM | POA: Diagnosis present

## 2022-03-30 DIAGNOSIS — T63001A Toxic effect of unspecified snake venom, accidental (unintentional), initial encounter: Principal | ICD-10-CM | POA: Diagnosis present

## 2022-03-30 DIAGNOSIS — Z79890 Hormone replacement therapy (postmenopausal): Secondary | ICD-10-CM | POA: Diagnosis not present

## 2022-03-30 DIAGNOSIS — Y92017 Garden or yard in single-family (private) house as the place of occurrence of the external cause: Secondary | ICD-10-CM | POA: Diagnosis not present

## 2022-03-30 DIAGNOSIS — R3982 Chronic bladder pain: Secondary | ICD-10-CM | POA: Diagnosis present

## 2022-03-30 DIAGNOSIS — E039 Hypothyroidism, unspecified: Secondary | ICD-10-CM | POA: Diagnosis present

## 2022-03-30 DIAGNOSIS — Z23 Encounter for immunization: Secondary | ICD-10-CM

## 2022-03-30 DIAGNOSIS — E7800 Pure hypercholesterolemia, unspecified: Secondary | ICD-10-CM | POA: Diagnosis present

## 2022-03-30 LAB — CBC WITH DIFFERENTIAL/PLATELET
Abs Immature Granulocytes: 0.03 10*3/uL (ref 0.00–0.07)
Basophils Absolute: 0 10*3/uL (ref 0.0–0.1)
Basophils Relative: 1 %
Eosinophils Absolute: 0.1 10*3/uL (ref 0.0–0.5)
Eosinophils Relative: 2 %
HCT: 41.2 % (ref 36.0–46.0)
Hemoglobin: 13.8 g/dL (ref 12.0–15.0)
Immature Granulocytes: 0 %
Lymphocytes Relative: 21 %
Lymphs Abs: 1.5 10*3/uL (ref 0.7–4.0)
MCH: 32.2 pg (ref 26.0–34.0)
MCHC: 33.5 g/dL (ref 30.0–36.0)
MCV: 96 fL (ref 80.0–100.0)
Monocytes Absolute: 0.7 10*3/uL (ref 0.1–1.0)
Monocytes Relative: 10 %
Neutro Abs: 4.8 10*3/uL (ref 1.7–7.7)
Neutrophils Relative %: 66 %
Platelets: 220 10*3/uL (ref 150–400)
RBC: 4.29 MIL/uL (ref 3.87–5.11)
RDW: 12.3 % (ref 11.5–15.5)
WBC: 7.3 10*3/uL (ref 4.0–10.5)
nRBC: 0 % (ref 0.0–0.2)

## 2022-03-30 LAB — BASIC METABOLIC PANEL
Anion gap: 12 (ref 5–15)
BUN: 23 mg/dL (ref 8–23)
CO2: 27 mmol/L (ref 22–32)
Calcium: 9.9 mg/dL (ref 8.9–10.3)
Chloride: 100 mmol/L (ref 98–111)
Creatinine, Ser: 1.16 mg/dL — ABNORMAL HIGH (ref 0.44–1.00)
GFR, Estimated: 48 mL/min — ABNORMAL LOW (ref >60)
Glucose, Bld: 127 mg/dL — ABNORMAL HIGH (ref 70–99)
Potassium: 3 mmol/L — ABNORMAL LOW (ref 3.5–5.1)
Sodium: 139 mmol/L (ref 135–145)

## 2022-03-30 LAB — PROTIME-INR
INR: 1.1 (ref 0.8–1.2)
Prothrombin Time: 14.2 seconds (ref 11.4–15.2)

## 2022-03-30 LAB — FIBRINOGEN: Fibrinogen: 322 mg/dL (ref 210–475)

## 2022-03-30 MED ORDER — TETANUS-DIPHTH-ACELL PERTUSSIS 5-2.5-18.5 LF-MCG/0.5 IM SUSY
0.5000 mL | PREFILLED_SYRINGE | Freq: Once | INTRAMUSCULAR | Status: AC
  Administered 2022-03-30: 0.5 mL via INTRAMUSCULAR
  Filled 2022-03-30: qty 0.5

## 2022-03-30 MED ORDER — GABAPENTIN 300 MG PO CAPS
300.0000 mg | ORAL_CAPSULE | Freq: Three times a day (TID) | ORAL | Status: DC
  Administered 2022-03-31 – 2022-04-01 (×4): 300 mg via ORAL
  Filled 2022-03-30 (×4): qty 1

## 2022-03-30 MED ORDER — SODIUM CHLORIDE 0.9 % IV BOLUS
1000.0000 mL | Freq: Once | INTRAVENOUS | Status: AC
  Administered 2022-03-30: 1000 mL via INTRAVENOUS

## 2022-03-30 MED ORDER — ONDANSETRON HCL 4 MG/2ML IJ SOLN
4.0000 mg | Freq: Once | INTRAMUSCULAR | Status: AC
  Administered 2022-03-30: 4 mg via INTRAVENOUS
  Filled 2022-03-30: qty 2

## 2022-03-30 MED ORDER — SODIUM CHLORIDE 0.9 % IV SOLN: INTRAVENOUS | Status: DC

## 2022-03-30 MED ORDER — OXYCODONE HCL 5 MG PO TABS
5.0000 mg | ORAL_TABLET | ORAL | Status: DC | PRN
  Administered 2022-03-31 – 2022-04-01 (×6): 5 mg via ORAL
  Filled 2022-03-30 (×6): qty 1

## 2022-03-30 MED ORDER — HYDROMORPHONE HCL 1 MG/ML IJ SOLN
0.5000 mg | INTRAMUSCULAR | Status: DC | PRN
  Administered 2022-03-30: 0.5 mg via INTRAVENOUS
  Filled 2022-03-30: qty 0.5

## 2022-03-30 MED ORDER — ACETAMINOPHEN 325 MG PO TABS: 650.0000 mg | ORAL_TABLET | Freq: Four times a day (QID) | ORAL | Status: DC | PRN

## 2022-03-30 MED ORDER — ONDANSETRON HCL 4 MG/2ML IJ SOLN: 4.0000 mg | Freq: Once | INTRAMUSCULAR | Status: DC

## 2022-03-30 MED ORDER — MORPHINE SULFATE (PF) 2 MG/ML IV SOLN
2.0000 mg | INTRAVENOUS | Status: DC | PRN
  Administered 2022-03-31 (×5): 2 mg via INTRAVENOUS
  Filled 2022-03-30 (×5): qty 1

## 2022-03-30 MED ORDER — POTASSIUM CHLORIDE 10 MEQ/100ML IV SOLN
10.0000 meq | INTRAVENOUS | Status: AC
  Administered 2022-03-31 (×3): 10 meq via INTRAVENOUS
  Filled 2022-03-30 (×3): qty 100

## 2022-03-30 MED ORDER — LEVOTHYROXINE SODIUM 75 MCG PO TABS
75.0000 ug | ORAL_TABLET | Freq: Every day | ORAL | Status: DC
  Administered 2022-03-31 – 2022-04-01 (×2): 75 ug via ORAL
  Filled 2022-03-30 (×2): qty 1

## 2022-03-30 MED ORDER — ONDANSETRON HCL 4 MG/2ML IJ SOLN
4.0000 mg | Freq: Four times a day (QID) | INTRAMUSCULAR | Status: DC | PRN
  Administered 2022-03-31: 4 mg via INTRAVENOUS
  Filled 2022-03-30: qty 2

## 2022-03-30 MED ORDER — PANTOPRAZOLE SODIUM 40 MG PO TBEC
40.0000 mg | DELAYED_RELEASE_TABLET | Freq: Every day | ORAL | Status: DC
  Administered 2022-03-31 – 2022-04-01 (×2): 40 mg via ORAL
  Filled 2022-03-30 (×2): qty 1

## 2022-03-30 MED ORDER — ATORVASTATIN CALCIUM 40 MG PO TABS
40.0000 mg | ORAL_TABLET | Freq: Every day | ORAL | Status: DC
Start: 2022-03-31 — End: 2022-04-01
  Administered 2022-03-31: 40 mg via ORAL
  Filled 2022-03-30: qty 1

## 2022-03-30 MED ORDER — ONDANSETRON HCL 4 MG PO TABS: 4.0000 mg | ORAL_TABLET | Freq: Four times a day (QID) | ORAL | Status: DC | PRN

## 2022-03-30 MED ORDER — MORPHINE SULFATE (PF) 4 MG/ML IV SOLN: 4.0000 mg | Freq: Once | INTRAVENOUS | Status: DC

## 2022-03-30 MED ORDER — AMITRIPTYLINE HCL 25 MG PO TABS
50.0000 mg | ORAL_TABLET | Freq: Every day | ORAL | Status: DC
Start: 2022-03-31 — End: 2022-04-01
  Administered 2022-03-31: 50 mg via ORAL
  Filled 2022-03-30: qty 2

## 2022-03-30 MED ORDER — HYDROMORPHONE HCL 1 MG/ML IJ SOLN
0.5000 mg | Freq: Once | INTRAMUSCULAR | Status: AC
  Administered 2022-03-30: 0.5 mg via INTRAVENOUS
  Filled 2022-03-30: qty 0.5

## 2022-03-30 MED ORDER — SODIUM CHLORIDE 0.9 % IV SOLN
4.0000 | Freq: Once | INTRAVENOUS | Status: AC
  Administered 2022-03-30: 4 via INTRAVENOUS
  Filled 2022-03-30: qty 36

## 2022-03-30 MED ORDER — ACETAMINOPHEN 650 MG RE SUPP: 650.0000 mg | Freq: Four times a day (QID) | RECTAL | Status: DC | PRN

## 2022-03-30 NOTE — ED Triage Notes (Signed)
Pt arrived from home via POV w c/o animal bite, Pt thinks it was a rat but not really sure. Bite happened approx 1.5 hours ago to right pointer finger. Right hand is severly swollen and swelling is continuing up the arm. 10/10 on pain scale

## 2022-03-30 NOTE — ED Provider Notes (Signed)
[facility] EMERGENCY DEPARTMENT Provider Note   CSN: [id number] Arrival date & time: 03/30/22  4098     History  Chief Complaint  Patient presents with   Animal Bite    Terry Allen is a 81 y.o. female who presents emergency department with a chief complaint of right index finger pain.  Patient states that her dog was barking she went outside and parted some grass.  She saw a furry animal running off but she was bitten on her finger.  This was at 6:30 PM.  She washed the finger and alcohol and water and since that time.  Within 2 hours she has had extensive and severe pain and swelling all the way up to her elbow.  She is unsure of her last tetanus vaccination.  She denies numbness tingling weakness or dizziness   Animal Bite      Home Medications Prior to Admission medications   Medication Sig Start Date End Date Taking? Authorizing Provider  amitriptyline (ELAVIL) 50 MG tablet Take 50 mg by mouth at bedtime.  04/12/18   [provider]  atorvastatin (LIPITOR) 40 MG tablet Take 40 mg by mouth at bedtime.     [provider]  Calcium Carb-Cholecalciferol (CALCIUM 600 + D PO) Take 1 tablet by mouth 2 (two) times a day.    [provider]  cholecalciferol (VITAMIN D3) 25 MCG (1000 UNIT) tablet Take 1,000 Units by mouth daily.    [provider]  gabapentin (NEURONTIN) 300 MG capsule Take 2 capsules (600 mg total) by mouth 3 (three) times daily. Patient taking differently: Take 300 mg by mouth 3 (three) times daily. 10/23/15   Donita Brooks, MD  levothyroxine (SYNTHROID) 75 MCG tablet Take 75 mcg by mouth daily before breakfast.    [provider]  meloxicam (MOBIC) 15 MG tablet Take 1 tablet (15 mg total) by mouth daily. 02/17/22   Rehman, Joline Maxcy, MD  mirabegron ER (MYRBETRIQ) 50 MG TB24 tablet Take 1 tablet (50 mg total) by mouth daily. 12/11/21   Stoneking, Danford Bad., MD  omeprazole (PRILOSEC) 40 MG capsule Take 40 mg by mouth  daily.    [provider]      Allergies    Patient has no known allergies.    Review of Systems   Review of Systems  Physical Exam Updated Vital Signs BP 137/90 (BP Location: Left Arm)   Pulse 65   Temp 97.8 F (36.6 C) (Oral)   Resp (!) 21   SpO2 97%  Physical Exam Vitals and nursing note reviewed.  Constitutional:      General: She is not in acute distress.    Appearance: She is well-developed. She is not diaphoretic.  HENT:     Head: Normocephalic and atraumatic.     Right Ear: External ear normal.     Left Ear: External ear normal.     Nose: Nose normal.     Mouth/Throat:     Mouth: Mucous membranes are moist.  Eyes:     General: No scleral icterus.    Conjunctiva/sclera: Conjunctivae normal.  Cardiovascular:     Rate and Rhythm: Normal rate and regular rhythm.     Heart sounds: Normal heart sounds. No murmur heard.    No friction rub. No gallop.  Pulmonary:     Effort: Pulmonary effort is normal. No respiratory distress.     Breath sounds: Normal breath sounds.  Abdominal:     General: Bowel sounds are normal.  There is no distension.     Palpations: Abdomen is soft. There is no mass.     Tenderness: There is no abdominal tenderness. There is no guarding.  Musculoskeletal:     Cervical back: Normal range of motion.  Skin:    General: Skin is warm and dry.     Comments: Right hand with 2 small puncture wounds of the distal finger on the ulnar side of the finger.  There is bruising, tense swelling of the fingers and hand, swelling and tenderness all the way up to the elbow.  Radial pulse intact.  Neurological:     Mental Status: She is alert and oriented to person, place, and time.  Psychiatric:        Behavior: Behavior normal.        ED Results / Procedures / Treatments   Labs (all labs ordered are listed, but only abnormal results are displayed) Labs Reviewed - No data to display  EKG None  Radiology No results  found.  Procedures .Critical Care  Performed by: Arthor Captain, PA-C Authorized by: Arthor Captain, PA-C   Critical care provider statement:    Critical care time (minutes):  75   Critical care time was exclusive of:  Separately billable procedures and treating other patients   Critical care was necessary to treat or prevent imminent or life-threatening deterioration of the following conditions:  Toxidrome (Snakebite requiring antivenom)   Critical care was time spent personally by me on the following activities:  Development of treatment plan with patient or surrogate, discussions with consultants, evaluation of patient's response to treatment, examination of patient, ordering and review of laboratory studies, ordering and review of radiographic studies, ordering and performing treatments and interventions, pulse oximetry, re-evaluation of patient's condition and review of old charts     Medications Ordered in ED Medications - No data to display  ED Course/ Medical Decision Making/ A&P Clinical Course as of 03/30/22 2331  Sat Mar 30, 2022  2047 Patient here with animal bite to the finger.  Her symptoms are most consistent with envenomation from a snake as she has 2 small puncture wounds on the finger also consistent with fang marks.  I do not think that this was mammalian bite as the teeth markings that are not consistent and given the extensive pain and swelling I do not think that within 2 hours she would have such an extensive reaction to simple mouth flora.  Given these findings I did discussed the symptoms with poison control.  Patient does qualify immediately for CroFab administration.  She is receiving pain medications, CroFab, monitoring.  She will get hourly and serial markings of her swelling.  She will need admission. [AH]  2227 Patient reevaluated and not have appear to have any worsening in the swelling after administration of CroFab.  Her pain is worsening however and likely  due to the fact that her meds have worn off.  Patient does not appear to be having any kind of allergic reaction to CroFab administration.  Will call hospitalist for admission.  I have reviewed the patient's labs.  Fibrinogen within normal limits as well as PT/INR, no significant abnormal findings. [AH]    Clinical Course User Index [AH] Arthor Captain, PA-C                           Medical Decision Making 81 year old female here with snakebite of the right index finger.  She presented and already  qualified for CroFab administration.  Patient seems to be stabilizing after CroFab administration.  No evidence of coagulopathy after review of her labs which are reassuring.  Patient will be admitted for pain control, continued monitoring of labs and swelling with serial demarcation.  She is stable for admission  Amount and/or Complexity of Data Reviewed Independent Historian: spouse External Data Reviewed: labs. Labs: ordered.  Risk Prescription drug management. Decision regarding hospitalization.    Final Clinical Impression(s) / ED Diagnoses Final diagnoses:  Snake bite, initial encounter    Rx / DC Orders ED Discharge Orders     None         Arthor Captain, PA-C 03/30/22 2333    Eber Hong, MD 03/31/22 1531

## 2022-03-31 DIAGNOSIS — E876 Hypokalemia: Secondary | ICD-10-CM | POA: Diagnosis present

## 2022-03-31 DIAGNOSIS — K2270 Barrett's esophagus without dysplasia: Secondary | ICD-10-CM | POA: Diagnosis present

## 2022-03-31 DIAGNOSIS — Z79890 Hormone replacement therapy (postmenopausal): Secondary | ICD-10-CM | POA: Diagnosis not present

## 2022-03-31 DIAGNOSIS — W5911XA Bitten by nonvenomous snake, initial encounter: Secondary | ICD-10-CM | POA: Diagnosis not present

## 2022-03-31 DIAGNOSIS — K219 Gastro-esophageal reflux disease without esophagitis: Secondary | ICD-10-CM | POA: Diagnosis present

## 2022-03-31 DIAGNOSIS — R1314 Dysphagia, pharyngoesophageal phase: Secondary | ICD-10-CM | POA: Diagnosis present

## 2022-03-31 DIAGNOSIS — Y92017 Garden or yard in single-family (private) house as the place of occurrence of the external cause: Secondary | ICD-10-CM | POA: Diagnosis not present

## 2022-03-31 DIAGNOSIS — T63001A Toxic effect of unspecified snake venom, accidental (unintentional), initial encounter: Secondary | ICD-10-CM | POA: Diagnosis present

## 2022-03-31 DIAGNOSIS — E039 Hypothyroidism, unspecified: Secondary | ICD-10-CM

## 2022-03-31 DIAGNOSIS — R3915 Urgency of urination: Secondary | ICD-10-CM | POA: Diagnosis present

## 2022-03-31 DIAGNOSIS — Z79899 Other long term (current) drug therapy: Secondary | ICD-10-CM | POA: Diagnosis not present

## 2022-03-31 DIAGNOSIS — L03011 Cellulitis of right finger: Secondary | ICD-10-CM | POA: Diagnosis present

## 2022-03-31 DIAGNOSIS — E7800 Pure hypercholesterolemia, unspecified: Secondary | ICD-10-CM

## 2022-03-31 DIAGNOSIS — R3982 Chronic bladder pain: Secondary | ICD-10-CM | POA: Diagnosis present

## 2022-03-31 DIAGNOSIS — Z961 Presence of intraocular lens: Secondary | ICD-10-CM | POA: Diagnosis present

## 2022-03-31 DIAGNOSIS — Z23 Encounter for immunization: Secondary | ICD-10-CM | POA: Diagnosis present

## 2022-03-31 DIAGNOSIS — W5911XD Bitten by nonvenomous snake, subsequent encounter: Secondary | ICD-10-CM | POA: Diagnosis not present

## 2022-03-31 LAB — CBC WITH DIFFERENTIAL/PLATELET
Abs Immature Granulocytes: 0.04 10*3/uL (ref 0.00–0.07)
Abs Immature Granulocytes: 0.03 10*3/uL (ref 0.00–0.07)
Basophils Absolute: 0 10*3/uL (ref 0.0–0.1)
Basophils Absolute: 0 10*3/uL (ref 0.0–0.1)
Basophils Relative: 0 %
Basophils Relative: 0 %
Eosinophils Absolute: 0.1 10*3/uL (ref 0.0–0.5)
Eosinophils Absolute: 0.1 10*3/uL (ref 0.0–0.5)
Eosinophils Relative: 1 %
Eosinophils Relative: 2 %
HCT: 38.6 % (ref 36.0–46.0)
HCT: 37.9 % (ref 36.0–46.0)
Hemoglobin: 12.3 g/dL (ref 12.0–15.0)
Hemoglobin: 12.2 g/dL (ref 12.0–15.0)
Immature Granulocytes: 0 %
Immature Granulocytes: 0 %
Lymphocytes Relative: 15 %
Lymphocytes Relative: 21 %
Lymphs Abs: 1.6 10*3/uL (ref 0.7–4.0)
Lymphs Abs: 1.8 10*3/uL (ref 0.7–4.0)
MCH: 31.7 pg (ref 26.0–34.0)
MCH: 31.6 pg (ref 26.0–34.0)
MCHC: 31.9 g/dL (ref 30.0–36.0)
MCHC: 32.2 g/dL (ref 30.0–36.0)
MCV: 99.5 fL (ref 80.0–100.0)
MCV: 98.2 fL (ref 80.0–100.0)
Monocytes Absolute: 1 10*3/uL (ref 0.1–1.0)
Monocytes Absolute: 0.8 10*3/uL (ref 0.1–1.0)
Monocytes Relative: 9 %
Monocytes Relative: 9 %
Neutro Abs: 8.2 10*3/uL — ABNORMAL HIGH (ref 1.7–7.7)
Neutro Abs: 6 10*3/uL (ref 1.7–7.7)
Neutrophils Relative %: 75 %
Neutrophils Relative %: 68 %
Platelets: 175 10*3/uL (ref 150–400)
Platelets: 175 10*3/uL (ref 150–400)
RBC: 3.88 MIL/uL (ref 3.87–5.11)
RBC: 3.86 MIL/uL — ABNORMAL LOW (ref 3.87–5.11)
RDW: 12.3 % (ref 11.5–15.5)
RDW: 12.4 % (ref 11.5–15.5)
WBC: 10.9 10*3/uL — ABNORMAL HIGH (ref 4.0–10.5)
WBC: 8.8 10*3/uL (ref 4.0–10.5)
nRBC: 0 % (ref 0.0–0.2)
nRBC: 0 % (ref 0.0–0.2)

## 2022-03-31 LAB — COMPREHENSIVE METABOLIC PANEL
ALT: 11 U/L (ref 0–44)
AST: 23 U/L (ref 15–41)
Albumin: 3.4 g/dL — ABNORMAL LOW (ref 3.5–5.0)
Alkaline Phosphatase: 32 U/L — ABNORMAL LOW (ref 38–126)
Anion gap: 6 (ref 5–15)
BUN: 19 mg/dL (ref 8–23)
CO2: 26 mmol/L (ref 22–32)
Calcium: 8.4 mg/dL — ABNORMAL LOW (ref 8.9–10.3)
Chloride: 110 mmol/L (ref 98–111)
Creatinine, Ser: 0.77 mg/dL (ref 0.44–1.00)
GFR, Estimated: >60 mL/min (ref >60)
Glucose, Bld: 108 mg/dL — ABNORMAL HIGH (ref 70–99)
Potassium: 3.4 mmol/L — ABNORMAL LOW (ref 3.5–5.1)
Sodium: 142 mmol/L (ref 135–145)
Total Bilirubin: 0.6 mg/dL (ref 0.3–1.2)
Total Protein: 5.8 g/dL — ABNORMAL LOW (ref 6.5–8.1)

## 2022-03-31 LAB — PROTIME-INR
INR: 1.2 (ref 0.8–1.2)
INR: 1.2 (ref 0.8–1.2)
Prothrombin Time: 15.4 seconds — ABNORMAL HIGH (ref 11.4–15.2)
Prothrombin Time: 15.3 seconds — ABNORMAL HIGH (ref 11.4–15.2)

## 2022-03-31 LAB — FIBRINOGEN
Fibrinogen: 234 mg/dL (ref 210–475)
Fibrinogen: 252 mg/dL (ref 210–475)

## 2022-03-31 LAB — MAGNESIUM: Magnesium: 2 mg/dL (ref 1.7–2.4)

## 2022-03-31 LAB — TSH: TSH: 0.306 u[IU]/mL — ABNORMAL LOW (ref 0.350–4.500)

## 2022-03-31 MED ORDER — CROTALIDAE POLYVAL IMMUNE FAB IV SOLR
2.0000 | Freq: Once | INTRAVENOUS | Status: AC
  Administered 2022-03-31: 2 via INTRAVENOUS
  Filled 2022-03-31: qty 36

## 2022-03-31 MED ORDER — MORPHINE SULFATE (PF) 2 MG/ML IV SOLN: 2.0000 mg | INTRAVENOUS | Status: DC | PRN

## 2022-03-31 MED ORDER — AMOXICILLIN-POT CLAVULANATE 500-125 MG PO TABS
1.0000 | ORAL_TABLET | Freq: Two times a day (BID) | ORAL | Status: DC
  Administered 2022-03-31 – 2022-04-01 (×3): 500 mg via ORAL
  Filled 2022-03-31 (×3): qty 1

## 2022-03-31 MED ORDER — DIPHENHYDRAMINE HCL 50 MG/ML IJ SOLN
25.0000 mg | Freq: Once | INTRAMUSCULAR | Status: AC
  Administered 2022-03-31: 25 mg via INTRAVENOUS
  Filled 2022-03-31: qty 1

## 2022-03-31 NOTE — Hospital Course (Signed)
81 y.o. female with medical history significant of acid reflux, hyperlipidemia, hypothyroidism, chronic bladder pain, and more presents to the ED with a chief complaint of animal bite.  Patient reports that the dog was barking, so she went to part the grass with her hands to see what he was barking and something bit her.  She thought it was a rat.  Her bite appears to be more like a snake bite.  I was at 4:30 PM.  She noticed swelling almost immediately.  Initially the swelling was just in the right index finger where the bite was.  Eventually her whole hand was swollen, and the swelling moved up past the elbow.  Patient tried to clean the bite with rubbing alcohol, and came into the hospital with her hand in a bucket of rubbing alcohol.  Patient reports she had reduced range of motion of her fingers.  She had sharp constant pain in her hand.  The hand pain was worse with movement of her fingers, worse with palpation.  She had no numbness or tingling.  She did have erythema in the hand.  She had no other complaints.  Patient reports that she is otherwise in her normal state of health.   Of note her normal state of health does include bladder pain chronically.  She has had a bladder sling which also causes her to have urgency and frequency.  She also has a cough when she is eating from known esophageal dysphagia.   Patient does not smoke, does not drink, does not use illicit drugs.  She is vaccinated for COVID.  Patient is full code.

## 2022-03-31 NOTE — Progress Notes (Signed)
Bernice, from poison control, called for update on pt status. Measurements and VS provided per request. All questions answered to satisfaction.

## 2022-03-31 NOTE — Assessment & Plan Note (Signed)
Continue Synthroid °

## 2022-03-31 NOTE — Progress Notes (Signed)
RUE Measurements:  Distal mark on right hand: 8.5 inches  Right wrist mark: 6.25 inches  Right forearm mark: 7 inches  Right proximal upper arm mark: 9.5 inches

## 2022-03-31 NOTE — Progress Notes (Signed)
RUE Measurements:  Distal mark on right hand: 8.5 inches  Right wrist mark: 6 inches  Right forearm mark: 7 inches  Right proximal upper arm mark: 9.5 inches

## 2022-03-31 NOTE — Progress Notes (Signed)
Notified Dr. Clearence Ped regarding increased RUE measurement.  Received orders for additional crofab dose and to notify poison control.  Patty at poison control notified at 0540 and advised on measurements and additional dose of crofab to be given when available.

## 2022-03-31 NOTE — Progress Notes (Signed)
Right arm measurements:   Distal mark on right hand: 9 inches   Right wrist mark: 6.5 inches   Right Forearm: 7 inches   Right Proximal mark: 9 inches

## 2022-03-31 NOTE — H&P (Signed)
History and Physical    Patient: Terry Allen [id number]:[id number] DOB: 1941-08-02 DOA: 03/30/2022 DOS: the patient was seen and examined on 03/31/2022 PCP: [facility]  Patient coming from: Home  Chief Complaint:  Chief Complaint  Patient presents with   Animal Bite   HPI: Terry Allen is a 81 y.o. female with medical history significant of acid reflux, hyperlipidemia, hypothyroidism, chronic bladder pain, and more presents to the ED with a chief complaint of animal bite.  Patient reports that the dog was barking, so she went to part the grass with her hands to see what he was barking and something bit her.  She thought it was a rat.  Her bite appears to be more like a snake bite.  I was at 4:30 PM.  She noticed swelling almost immediately.  Initially the swelling was just in the right index finger where the bite was.  Eventually her whole hand was swollen, and the swelling moved up past the elbow.  Patient tried to clean the bite with rubbing alcohol, and came into the hospital with her hand in a bucket of rubbing alcohol.  Patient reports she had reduced range of motion of her fingers.  She had sharp constant pain in her hand.  The hand pain was worse with movement of her fingers, worse with palpation.  She had no numbness or tingling.  She did have erythema in the hand.  She had no other complaints.  Patient reports that she is otherwise in her normal state of health.  Of note her normal state of health does include bladder pain chronically.  She has had a bladder sling which also causes her to have urgency and frequency.  She also has a cough when she is eating from known esophageal dysphagia.  Patient does not smoke, does not drink, does not use illicit drugs.  She is vaccinated for COVID.  Patient is full code. Review of Systems: As mentioned in the history of present illness. All other systems reviewed and are negative. Past Medical History:  Diagnosis Date    Acid reflux    Patient reports Barretts   Bulging lumbar disc    High cholesterol    Hypothyroidism    Nerve damage    Osteopenia    Pelvic pain in female    due to bladder sling   Past Surgical History:  Procedure Laterality Date   APPENDECTOMY  1960   BIOPSY  11/03/2019   Procedure: BIOPSY;  Surgeon: Rogene Houston, MD;  [facility];  Service: Endoscopy;;   BLADDER SUSPENSION     CATARACT EXTRACTION W/PHACO Right 06/07/2019   Procedure: CATARACT EXTRACTION PHACO AND INTRAOCULAR LENS PLACEMENT ([place]);  Surgeon: Baruch Goldmann, MD;  Location: [facility];  Service: Ophthalmology;  Laterality: Right;  CDE: 10.87   CATARACT EXTRACTION W/PHACO Left 06/21/2019   Procedure: CATARACT EXTRACTION PHACO AND INTRAOCULAR LENS PLACEMENT ([facility]);  Surgeon: Baruch Goldmann, MD;  Location: [facility];  Service: Ophthalmology;  Laterality: Left;  CDE: 11.59   COLONOSCOPY N/A 11/03/2019   Procedure: COLONOSCOPY;  Surgeon: Rogene Houston, MD;  [facility];  Service: Endoscopy;  Laterality: N/A;  1245   ESOPHAGEAL DILATION N/A 05/27/2018   Procedure: ESOPHAGEAL DILATION;  Surgeon: Rogene Houston, MD;  [facility];  Service: Endoscopy;  Laterality: N/A;   ESOPHAGEAL DILATION N/A 02/12/2019   Procedure: ESOPHAGEAL DILATION;  Surgeon: Rogene Houston, MD;  [facility];  Service: Endoscopy;  Laterality: N/A;   ESOPHAGEAL DILATION N/A 02/14/2022   Procedure: ESOPHAGEAL DILATION;  Surgeon: Rogene Houston, MD;  [facility];  Service: Endoscopy;  Laterality: N/A;   ESOPHAGEAL DILATION N/A 02/27/2022   Procedure: ESOPHAGEAL DILATION;  Surgeon: Harvel Quale, MD;  [facility];  Service: Gastroenterology;  Laterality: N/A;   ESOPHAGOGASTRODUODENOSCOPY N/A 12/21/2014   Procedure: ESOPHAGOGASTRODUODENOSCOPY (EGD);  Surgeon: Rogene Houston, MD;  [facility];  Service: Endoscopy;  Laterality: N/A;  210   ESOPHAGOGASTRODUODENOSCOPY N/A  06/16/2015   Procedure: ESOPHAGOGASTRODUODENOSCOPY (EGD);  Surgeon: Rogene Houston, MD;  [facility];  Service: Endoscopy;  Laterality: N/A;  1250   ESOPHAGOGASTRODUODENOSCOPY N/A 05/27/2018   Procedure: ESOPHAGOGASTRODUODENOSCOPY (EGD);  Surgeon: Rogene Houston, MD;  [facility];  Service: Endoscopy;  Laterality: N/A;  7:30   ESOPHAGOGASTRODUODENOSCOPY N/A 02/12/2019   Procedure: ESOPHAGOGASTRODUODENOSCOPY (EGD);  Surgeon: Rogene Houston, MD;  [facility];  Service: Endoscopy;  Laterality: N/A;   ESOPHAGOGASTRODUODENOSCOPY (EGD) WITH PROPOFOL N/A 02/14/2022   Procedure: ESOPHAGOGASTRODUODENOSCOPY (EGD) WITH PROPOFOL;  Surgeon: Rogene Houston, MD;  [facility];  Service: Endoscopy;  Laterality: N/A;  920   ESOPHAGOGASTRODUODENOSCOPY (EGD) WITH PROPOFOL N/A 02/27/2022   Procedure: ESOPHAGOGASTRODUODENOSCOPY (EGD) WITH PROPOFOL;  Surgeon: Harvel Quale, MD;  [facility];  Service: Gastroenterology;  Laterality: N/A;  1:15 Per Dr Jenetta Downer asa 1   Social History:  reports that she has never smoked. She has never been exposed to tobacco smoke. She has never used smokeless tobacco. She reports that she does not drink alcohol and does not use drugs.  No Known Allergies  Family History  Problem Relation Age of Onset   Breast cancer Mother    Heart disease Mother    Diabetes Mother    Cancer Mother        breast   Stroke Mother    Hypertension Mother    Lung cancer Father    Stroke Father    Cancer Father        oat cell cancer/lung   Kidney cancer Brother    Cancer Brother        renal cell cancer   Healthy Brother    Diabetes Son     Prior to Admission medications   Medication Sig Start Date End Date Taking? Authorizing Provider  amitriptyline (ELAVIL) 50 MG tablet Take 50 mg by mouth at bedtime.  04/12/18   [provider]  atorvastatin (LIPITOR) 40 MG tablet Take 40 mg by mouth at bedtime.     [provider]  Calcium Carb-Cholecalciferol (CALCIUM 600 + D PO) Take 1 tablet by mouth 2 (two) times a day.    [provider]  cholecalciferol (VITAMIN D3) 25 MCG (1000 UNIT) tablet Take 1,000 Units by mouth daily.    [provider]  gabapentin (NEURONTIN) 300 MG capsule Take 2 capsules (600 mg total) by mouth 3 (three) times daily. Patient taking differently: Take 300 mg by mouth 3 (three) times daily. 10/23/15   Susy Frizzle, MD  levothyroxine (SYNTHROID) 75 MCG tablet Take 75 mcg by mouth daily before breakfast.    [provider]  meloxicam (MOBIC) 15 MG tablet Take 1 tablet (15 mg total) by mouth daily. 02/17/22   Rehman, Mechele Dawley, MD  mirabegron ER (MYRBETRIQ) 50 MG TB24 tablet Take 1 tablet (50 mg total) by mouth daily. 12/11/21   Stoneking, Reece Leader., MD  omeprazole (  PRILOSEC) 40 MG capsule Take 40 mg by mouth daily.    [provider]    Physical Exam: Vitals:   03/30/22 2300 03/30/22 2318 03/30/22 2347 03/30/22 2347  BP: 138/79   138/84  Pulse: 77   80  Resp: 20     Temp: 97.7 F (36.5 C)     TempSrc: Oral     SpO2: 100%   99%  Weight:  50 kg 50 kg   Height:  '5\' 2"'$  (1.575 m) '5\' 2"'$  (1.575 m)    1.  General: Patient lying supine in bed,  no acute distress   2. Psychiatric: Alert and oriented x 3, mood and behavior normal for situation, pleasant and cooperative with exam   3. Neurologic: Speech and language are normal, face is symmetric, moves all 4 extremities voluntarily, at baseline without acute deficits on limited exam   4. HEENMT:  Head is atraumatic, normocephalic, pupils reactive to light, neck is supple, trachea is midline, mucous membranes are moist   5. Respiratory : Lungs are clear to auscultation bilaterally without wheezing, rhonchi, rales, no cyanosis, no increase in work of breathing or accessory muscle use   6. Cardiovascular : Heart rate normal, rhythm is regular, no murmurs, rubs or gallops, no peripheral  edema, peripheral pulses palpated   7. Gastrointestinal:  Abdomen is soft, nondistended, nontender to palpation bowel sounds active, no masses or organomegaly palpated   8. Skin:  Skin is warm, dry and intact without rashes, acute lesions, or ulcers on limited exam   9.Musculoskeletal:  Right arm elevated on table next to bed, and is still very swollen, swelling has been marked on the arm and we will monitor for progression.  Improving range of motion of fingers, intact sensation of fingers, bruising and bleeding with 2 puncture marks on her index finger  Data Reviewed: In the ED Temp 97.8, heart rate 65-81, respiratory rate 16-24, blood pressure 137/83-161/111 Satting at 100% on room air No leukocytosis, hemoglobin 13.8, platelets 220 Chemistry reveals a creatinine very close to baseline at 1.16, and a hypokalemia CroFab 4 vials given in the ED Dilaudid 0.5 mg in the ED Zofran 4 mg Tdap NS at 125 mill per hour Poison control consulted   Assessment and Plan: * Snake bite - Swelling and pain -CroFab 4 vials given in the ED -Poison control recommends measuring in 2 hours and the swelling has not stabilized, 2 more vials of CroFab -Pain control -Patient was given Tdap -Blood pressure stable, no signs of anaphylaxis or angioedema -Continue to monitor  Hypokalemia - Replace and recheck  High cholesterol - Continue Lipitor  Hypothyroidism - Continue Synthroid  GERD (gastroesophageal reflux disease) - With history of Barrett's esophagus -Continue Protonix -Continue to monitor      Advance Care Planning:   Code Status: Full Code   Consults: None  Family Communication: Son and daughter-in-law at bedside  Severity of Illness: The appropriate patient status for this patient is OBSERVATION. Observation status is judged to be reasonable and necessary in order to provide the required intensity of service to ensure the patient's safety. The patient's presenting symptoms,  physical exam findings, and initial radiographic and laboratory data in the context of their medical condition is felt to place them at decreased risk for further clinical deterioration. Furthermore, it is anticipated that the patient will be medically stable for discharge from the hospital within 2 midnights of admission.   Author: Rolla Plate, DO 03/31/2022 12:06 AM  For on  call review [email or web address].

## 2022-03-31 NOTE — Progress Notes (Signed)
Right arm measurements:  Distal mark on right hand: 8.5 inches  Right wrist mark: 6 inches  Right Forearm: 7.5 inches  Right Proximal mark: 8.5 inches

## 2022-03-31 NOTE — Progress Notes (Signed)
Right arm measurements:   Distal mark on right hand: 8.5 inches   Right wrist mark: 6.25 inches   Right Forearm: 7 inches   Right Proximal mark: 9.5 inches

## 2022-03-31 NOTE — Progress Notes (Signed)
PROGRESS NOTE   Terry Allen  NWG:[id number] DOB: 1941-03-12 DOA: 03/30/2022 PCP: [name]   Chief Complaint  Patient presents with   Animal Bite   Level of care: Telemetry  Brief Admission History:  81 y.o. female with medical history significant of acid reflux, hyperlipidemia, hypothyroidism, chronic bladder pain, and more presents to the ED with a chief complaint of animal bite.  Patient reports that the dog was barking, so she went to part the grass with her hands to see what he was barking and something bit her.  She thought it was a rat.  Her bite appears to be more like a snake bite.  I was at 4:30 PM.  She noticed swelling almost immediately.  Initially the swelling was just in the right index finger where the bite was.  Eventually her whole hand was swollen, and the swelling moved up past the elbow.  Patient tried to clean the bite with rubbing alcohol, and came into the hospital with her hand in a bucket of rubbing alcohol.  Patient reports she had reduced range of motion of her fingers.  She had sharp constant pain in her hand.  The hand pain was worse with movement of her fingers, worse with palpation.  She had no numbness or tingling.  She did have erythema in the hand.  She had no other complaints.  Patient reports that she is otherwise in her normal state of health.   Of note her normal state of health does include bladder pain chronically.  She has had a bladder sling which also causes her to have urgency and frequency.  She also has a cough when she is eating from known esophageal dysphagia.   Patient does not smoke, does not drink, does not use illicit drugs.  She is vaccinated for COVID.  Patient is full code.   Assessment and Plan: * Snake bite - Swelling and pain -CroFab 4 vials given in the ED -Poison control recommends measuring in 2 hours and the swelling has not stabilized, 2 more vials of CroFab -Pain control -Patient was given Tdap -Blood  pressure stable, no signs of anaphylaxis or angioedema -Continue to monitor -blood cultures were not done  - augmentin 500 mg BID started   Hypokalemia - Replace and recheck  High cholesterol - Continue Lipitor  Hypothyroidism - Continue Synthroid  GERD (gastroesophageal reflux disease) - With history of Barrett's esophagus -Continue Protonix -Continue to monitor   DVT prophylaxis: SCDs Code Status: full  Family Communication: husband at bedside  Disposition: Status is: Inpatient Remains inpatient appropriate because: IV pain management IV fluid   Consultants:  Poison control  Procedures:   Antimicrobials:  Augmentin 7/23>>  Subjective: Pt reports pain and swelling at the injury site, she says edema is unchanged. Blood dripping from puncture wound.    Objective: Vitals:   03/31/22 0501 03/31/22 0810 03/31/22 0959 03/31/22 1333  BP: 120/67 120/68 (!) 103/54 (!) 100/55  Pulse: 71 73 66 73  Resp:  18 17 17   Temp: 98.4 F (36.9 C) 97.7 F (36.5 C) 97.7 F (36.5 C) 97.8 F (36.6 C)  TempSrc: Oral Oral Oral Oral  SpO2: 99% 99% 100% 100%  Weight:      Height:        Intake/Output Summary (Last 24 hours) at 03/31/2022 1502 Last data filed at 03/31/2022 0318 Gross per 24 hour  Intake 2125.59 ml  Output --  Net 2125.59 ml   Filed Weights   03/30/22 2318  03/30/22 2347 03/31/22 0500  Weight: 50 kg 50 kg 50 kg   Examination:  General exam: Appears calm and comfortable  Respiratory system: Clear to auscultation. Respiratory effort normal. Cardiovascular system: normal S1 & S2 heard. No JVD, murmurs, rubs, gallops or clicks. No pedal edema. Gastrointestinal system: Abdomen is nondistended, soft and nontender. No organomegaly or masses felt. Normal bowel sounds heard. Central nervous system: Alert and oriented. No focal neurological deficits. Extremities: Symmetric 5 x 5 power. Skin: No rashes, lesions or ulcers. Psychiatry: Judgement and insight appear normal.  Mood & affect appropriate.   Data Reviewed: I have personally reviewed following labs and imaging studies  CBC: Recent Labs  Lab 03/30/22 1955 03/31/22 0053 03/31/22 0502  WBC 7.3 10.9* 8.8  NEUTROABS 4.8 8.2* 6.0  HGB 13.8 12.3 12.2  HCT 41.2 38.6 37.9  MCV 96.0 99.5 98.2  PLT 220 175 175    Basic Metabolic Panel: Recent Labs  Lab 03/30/22 1955 03/31/22 0053 03/31/22 0502  NA 139 142  --   K 3.0* 3.4*  --   CL 100 110  --   CO2 27 26  --   GLUCOSE 127* 108*  --   BUN 23 19  --   CREATININE 1.16* 0.77  --   CALCIUM 9.9 8.4*  --   MG  --   --  2.0    CBG: No results for input(s): "GLUCAP" in the last 168 hours.  No results found for this or any previous visit (from the past 240 hour(s)).   Radiology Studies: No results found.  Scheduled Meds:  amitriptyline  50 mg Oral QHS   amoxicillin-clavulanate  1 tablet Oral BID   atorvastatin  40 mg Oral QHS   gabapentin  300 mg Oral TID   levothyroxine  75 mcg Oral Q0600   pantoprazole  40 mg Oral Daily   Continuous Infusions:  sodium chloride 125 mL/hr at 03/31/22 0558    LOS: 0 days   Time spent: 35 mins  Terry Delmonaco Laural Benes, MD How to contact the [facility] Attending or Consulting provider 7A - 7P or covering provider during after hours 7P -7A, for this patient?  Check the care team in [facility] and look for a) attending/consulting [facility] provider listed and b) the [facility] team listed Log into [email or web address] and use [place]'s universal password to access. If you do not have the password, please contact the hospital operator. Locate the [facility] provider you are looking for under [facility] and page to a number that you can be directly reached. If you still have difficulty reaching the provider, please page the [facility] (Director on Call) for the Hospitalists listed on amion for assistance.  03/31/2022, 3:02 PM

## 2022-03-31 NOTE — Progress Notes (Signed)
Leah from [employer] called for update.  Advised that pt continues to keep arm elevated above heart level, taking pain meds, measurements being monitored and staying in the 0.5 inch range.  Poison control can be reached at [phone]

## 2022-03-31 NOTE — Plan of Care (Signed)
  Problem: Pain Managment: Goal: General experience of comfort will improve Outcome: Progressing   Problem: Safety: Goal: Ability to remain free from injury will improve Outcome: Progressing   Problem: Skin Integrity: Goal: Risk for impaired skin integrity will decrease Outcome: Progressing   

## 2022-03-31 NOTE — Assessment & Plan Note (Signed)
-  Continue Lipitor °

## 2022-03-31 NOTE — Assessment & Plan Note (Addendum)
Repleted. °

## 2022-03-31 NOTE — Assessment & Plan Note (Addendum)
-   With history of Barrett's esophagus -Continue Protonix

## 2022-03-31 NOTE — Progress Notes (Signed)
RUE Measurements:  Distal mark on right hand: 8.5 inches  Right wrist mark: 6.5 inches  Right forearm mark: 7 inches  Right proximal upper arm mark: 9.5 inches   Pt denies pain at this time.

## 2022-03-31 NOTE — Assessment & Plan Note (Addendum)
-   Swelling and pain much improved -CroFab 4 vials given in the ED -Poison control recommends measuring in 2 hours and the swelling has not stabilized, 2 more vials of CroFab -oxycodone prescribed for severe pain at home -Patient was given Tdap in ED -Blood pressure stable, no signs of anaphylaxis or angioedema -Labs and coag panel stable -blood cultures were not done  - pt given ceftriaxone IV and will start oral cefdinir 300 mg BID  - I spoke with orthopedist  Hand coverage Dr. Amedeo Kinsman and he will see patient in office tomorrow at [facility] office.  He recommended patient to hibiclens soaks of finger 3-4 times per day and continue antibiotics.   Pt and her spouse agreeable to seeing Dr. Amedeo Kinsman tomorrow in the office.

## 2022-03-31 NOTE — Progress Notes (Signed)
  Right arm measurements:   Distal mark on right hand: 8.5 inches   Right wrist mark: 6.5 inches   Right Forearm: 7 inches   Right Proximal mark: 9.25 inches

## 2022-04-01 DIAGNOSIS — L03011 Cellulitis of right finger: Secondary | ICD-10-CM | POA: Diagnosis not present

## 2022-04-01 DIAGNOSIS — E7800 Pure hypercholesterolemia, unspecified: Secondary | ICD-10-CM | POA: Diagnosis not present

## 2022-04-01 DIAGNOSIS — W5911XD Bitten by nonvenomous snake, subsequent encounter: Secondary | ICD-10-CM | POA: Diagnosis not present

## 2022-04-01 DIAGNOSIS — E876 Hypokalemia: Secondary | ICD-10-CM | POA: Diagnosis not present

## 2022-04-01 LAB — BASIC METABOLIC PANEL
Anion gap: 3 — ABNORMAL LOW (ref 5–15)
BUN: 10 mg/dL (ref 8–23)
CO2: 30 mmol/L (ref 22–32)
Calcium: 8.5 mg/dL — ABNORMAL LOW (ref 8.9–10.3)
Chloride: 107 mmol/L (ref 98–111)
Creatinine, Ser: 0.64 mg/dL (ref 0.44–1.00)
GFR, Estimated: >60 mL/min (ref >60)
Glucose, Bld: 92 mg/dL (ref 70–99)
Potassium: 4.3 mmol/L (ref 3.5–5.1)
Sodium: 140 mmol/L (ref 135–145)

## 2022-04-01 LAB — CBC
HCT: 40.3 % (ref 36.0–46.0)
Hemoglobin: 12.4 g/dL (ref 12.0–15.0)
MCH: 31.3 pg (ref 26.0–34.0)
MCHC: 30.8 g/dL (ref 30.0–36.0)
MCV: 101.8 fL — ABNORMAL HIGH (ref 80.0–100.0)
Platelets: 160 10*3/uL (ref 150–400)
RBC: 3.96 MIL/uL (ref 3.87–5.11)
RDW: 12.6 % (ref 11.5–15.5)
WBC: 7.7 10*3/uL (ref 4.0–10.5)
nRBC: 0 % (ref 0.0–0.2)

## 2022-04-01 LAB — PROTIME-INR
INR: 1.1 (ref 0.8–1.2)
Prothrombin Time: 14 seconds (ref 11.4–15.2)

## 2022-04-01 LAB — APTT: aPTT: 24 seconds (ref 24–36)

## 2022-04-01 MED ORDER — GABAPENTIN 300 MG PO CAPS: 300.0000 mg | ORAL_CAPSULE | Freq: Three times a day (TID) | ORAL | Status: AC

## 2022-04-01 MED ORDER — SODIUM CHLORIDE 0.9 % IV SOLN
1.0000 g | Freq: Once | INTRAVENOUS | Status: AC
  Administered 2022-04-01: 1 g via INTRAVENOUS
  Filled 2022-04-01: qty 10

## 2022-04-01 MED ORDER — OXYCODONE HCL 5 MG PO TABS: 5.0000 mg | ORAL_TABLET | Freq: Four times a day (QID) | ORAL | 0 refills | Status: AC | PRN

## 2022-04-01 MED ORDER — CHLORHEXIDINE GLUCONATE 4 % EX LIQD
Freq: Three times a day (TID) | CUTANEOUS | Status: DC
  Filled 2022-04-01: qty 15

## 2022-04-01 MED ORDER — SACCHAROMYCES BOULARDII 250 MG PO CAPS: 250.0000 mg | ORAL_CAPSULE | Freq: Two times a day (BID) | ORAL | 0 refills | Status: AC

## 2022-04-01 MED ORDER — CHLORHEXIDINE GLUCONATE 4 % EX LIQD: CUTANEOUS | 1 refills | Status: DC

## 2022-04-01 MED ORDER — CEFDINIR 300 MG PO CAPS: 300.0000 mg | ORAL_CAPSULE | Freq: Two times a day (BID) | ORAL | 0 refills | Status: AC

## 2022-04-01 MED ORDER — POLYETHYLENE GLYCOL 3350 17 G PO PACK: 17.0000 g | PACK | Freq: Every day | ORAL | 0 refills | Status: AC

## 2022-04-01 MED ORDER — ACETAMINOPHEN 325 MG PO TABS: 650.0000 mg | ORAL_TABLET | Freq: Four times a day (QID) | ORAL | Status: DC | PRN

## 2022-04-01 NOTE — Care Management Important Message (Signed)
Important Message  Patient Details  Name: Terry Allen MRN: [id number] Date of Birth: 1941/02/16   Medicare Important Message Given:  N/A - LOS <3 / Initial given by admissions     Tommy Medal 04/01/2022, 11:48 AM

## 2022-04-01 NOTE — Assessment & Plan Note (Signed)
--   treating with hibiclens soaks 3-4 times per day as recommended by ortho and 3rd gen cephalosporin cefdinir 300 mg BID x 5 more days, gave patient 1 dose of IV ceftriaxone prior to discharge.  Pt will follow up with orthopedist Dr. Amedeo Kinsman tomorrow in his office and he will re-evaluate

## 2022-04-01 NOTE — Progress Notes (Signed)
Right arm measurements:   Distal mark on right hand: 8.25 inches   Right wrist mark: 6.25 inches   Right Forearm: 7 inches   Right Proximal mark: 9.5 inches

## 2022-04-01 NOTE — Progress Notes (Signed)
Right arm measurements:   Distal mark on right hand: 8.25 inches   Right wrist mark: 6.25 inches   Right Forearm: 7 inches   Right Proximal mark: 9.25 inches

## 2022-04-01 NOTE — Plan of Care (Signed)

## 2022-04-01 NOTE — TOC Progression Note (Signed)
Transition of Care [facility]) - Progression Note    Patient Details  Name: ROZELLE CAUDLE MRN: [id number] Date of Birth: [birth year]  Transition of Care [facility]) CM/SW Contact  Salome Arnt, [place] Phone Number: 04/01/2022, 9:57 AM  Clinical Narrative:   Transition of Care [facility]) Screening Note   Patient Details  Name: CELINDA DETHLEFS Date of Birth: 11-15-1940   Transition of Care [facility]) CM/SW Contact:    Salome Arnt, LCSW Phone Number: 04/01/2022, 9:58 AM    Transition of Care Department [facility]) has reviewed patient and no TOC needs have been identified at this time. We will continue to monitor patient advancement through interdisciplinary progression rounds. If new patient transition needs arise, please place a TOC consult.             Expected Discharge Plan and Services                                                 Social Determinants of Health (SDOH) Interventions    Readmission Risk Interventions     No data to display

## 2022-04-01 NOTE — Discharge Summary (Signed)
Physician Discharge Summary  Terry Allen HEN:[id number] DOB: [birth year] DOA: 03/30/2022  PCP: [name]  Admit date: 03/30/2022 Discharge date: 04/01/2022  Admitted From:  Home  Disposition: Home   Recommendations for Outpatient Follow-up:  Follow up with Dr. Amedeo Kinsman tomorrow in [facility]  Follow up with PCP in 1 week for recheck Pt should be soaking right index finger in hibiclens solution 3-4 times per day   Discharge Condition: STABLE   CODE STATUS: FULL  DIET: resume prior home diet    Brief Hospitalization Summary: Please see all hospital notes, images, labs for full details of the hospitalization. 81 y.o. female with medical history significant of acid reflux, hyperlipidemia, hypothyroidism, chronic bladder pain, and more presents to the ED with a chief complaint of animal bite.  Patient reports that the dog was barking, so she went to part the grass with her hands to see what he was barking and something bit her.  She thought it was a rat.  Her bite appears to be more like a snake bite.  I was at 4:30 PM.  She noticed swelling almost immediately.  Initially the swelling was just in the right index finger where the bite was.  Eventually her whole hand was swollen, and the swelling moved up past the elbow.  Patient tried to clean the bite with rubbing alcohol, and came into the hospital with her hand in a bucket of rubbing alcohol.  Patient reports she had reduced range of motion of her fingers.  She had sharp constant pain in her hand.  The hand pain was worse with movement of her fingers, worse with palpation.  She had no numbness or tingling.  She did have erythema in the hand.  She had no other complaints.  Patient reports that she is otherwise in her normal state of health.   Of note her normal state of health does include bladder pain chronically.  She has had a bladder sling which also causes her to have urgency and frequency.  She also has a  cough when she is eating from known esophageal dysphagia.   Patient does not smoke, does not drink, does not use illicit drugs.  She is vaccinated for COVID.  Patient is full code.  Hospital Course by Problem   * Snake bite - Swelling and pain much improved -CroFab 4 vials given in the ED -Poison control recommends measuring in 2 hours and the swelling has not stabilized, 2 more vials of CroFab -oxycodone prescribed for severe pain at home -Patient was given Tdap in ED -Blood pressure stable, no signs of anaphylaxis or angioedema -Labs and coag panel stable -blood cultures were not done  - pt given ceftriaxone IV and will start oral cefdinir 300 mg BID  - I spoke with orthopedist  Hand coverage Dr. Amedeo Kinsman and he will see patient in office tomorrow at [facility] office.  He recommended patient to hibiclens soaks of finger 3-4 times per day and continue antibiotics.   Pt and her spouse agreeable to seeing Dr. Amedeo Kinsman tomorrow in the office.     Cellulitis of right index finger -- treating with hibiclens soaks 3-4 times per day as recommended by ortho and 3rd gen cephalosporin cefdinir 300 mg BID x 5 more days, gave patient 1 dose of IV ceftriaxone prior to discharge.  Pt will follow up with orthopedist Dr. Amedeo Kinsman tomorrow in his office and he will re-evaluate   Hypokalemia - Repleted  High cholesterol - Continue Lipitor  Hypothyroidism - Continue Synthroid  GERD (gastroesophageal reflux disease) - With history of Barrett's esophagus -Continue Protonix  Discharge Diagnoses:  Principal Problem:   Snake bite Active Problems:   GERD (gastroesophageal reflux disease)   Hypothyroidism   High cholesterol   Hypokalemia   Cellulitis of right index finger   Discharge Instructions:  Allergies as of 04/01/2022   No Known Allergies      Medication List     STOP taking these medications    doxycycline 100 MG tablet Commonly known as: VIBRA-TABS   meloxicam 15 MG  tablet Commonly known as: MOBIC   mirabegron ER 50 MG Tb24 tablet Commonly known as: MYRBETRIQ   omeprazole 40 MG capsule Commonly known as: PRILOSEC       TAKE these medications    acetaminophen 325 MG tablet Commonly known as: TYLENOL Take 2 tablets (650 mg total) by mouth every 6 (six) hours as needed for mild pain (or Fever >/= 101).   amitriptyline 50 MG tablet Commonly known as: ELAVIL Take 50 mg by mouth at bedtime.   atorvastatin 40 MG tablet Commonly known as: LIPITOR Take 40 mg by mouth at bedtime.   CALCIUM 600 + D PO Take 1 tablet by mouth 2 (two) times a day.   cefdinir 300 MG capsule Commonly known as: OMNICEF Take 1 capsule (300 mg total) by mouth 2 (two) times daily for 5 days. Start taking on: April 02, 2022   chlorhexidine 4 % external liquid Commonly known as: HIBICLENS Soak right index finger 3-4 times per day   cholecalciferol 25 MCG (1000 UNIT) tablet Commonly known as: VITAMIN D3 Take 1,000 Units by mouth daily.   gabapentin 300 MG capsule Commonly known as: NEURONTIN Take 1 capsule (300 mg total) by mouth 3 (three) times daily.   levothyroxine 50 MCG tablet Commonly known as: SYNTHROID Take 50 mcg by mouth daily before breakfast.   oxyCODONE 5 MG immediate release tablet Commonly known as: Oxy IR/ROXICODONE Take 1 tablet (5 mg total) by mouth every 6 (six) hours as needed for up to 3 days for severe pain.   polyethylene glycol 17 g packet Commonly known as: MIRALAX / GLYCOLAX Take 17 g by mouth daily.   saccharomyces boulardii 250 MG capsule Commonly known as: Florastor Take 1 capsule (250 mg total) by mouth 2 (two) times daily for 14 days.        Follow-up Information     [name], MD. Schedule an appointment as soon as possible for a visit in 1 day(s).   Specialties: Orthopedic Surgery, Sports Medicine Why: Hospital Follow Up Contact information: [place]. [address] [place] [address] [phone]          [name]. Schedule an appointment as soon as possible for a visit in 1 week(s).   Why: Hospital Follow Up Contact information: [place] [id number] [phone]                No Known Allergies Allergies as of 04/01/2022   No Known Allergies      Medication List     STOP taking these medications    doxycycline 100 MG tablet Commonly known as: VIBRA-TABS   meloxicam 15 MG tablet Commonly known as: MOBIC   mirabegron ER 50 MG Tb24 tablet Commonly known as: MYRBETRIQ   omeprazole 40 MG capsule Commonly known as: PRILOSEC       TAKE these medications    acetaminophen 325 MG tablet Commonly  known as: TYLENOL Take 2 tablets (650 mg total) by mouth every 6 (six) hours as needed for mild pain (or Fever >/= 101).   amitriptyline 50 MG tablet Commonly known as: ELAVIL Take 50 mg by mouth at bedtime.   atorvastatin 40 MG tablet Commonly known as: LIPITOR Take 40 mg by mouth at bedtime.   CALCIUM 600 + D PO Take 1 tablet by mouth 2 (two) times a day.   cefdinir 300 MG capsule Commonly known as: OMNICEF Take 1 capsule (300 mg total) by mouth 2 (two) times daily for 5 days. Start taking on: April 02, 2022   chlorhexidine 4 % external liquid Commonly known as: HIBICLENS Soak right index finger 3-4 times per day   cholecalciferol 25 MCG (1000 UNIT) tablet Commonly known as: VITAMIN D3 Take 1,000 Units by mouth daily.   gabapentin 300 MG capsule Commonly known as: NEURONTIN Take 1 capsule (300 mg total) by mouth 3 (three) times daily.   levothyroxine 50 MCG tablet Commonly known as: SYNTHROID Take 50 mcg by mouth daily before breakfast.   oxyCODONE 5 MG immediate release tablet Commonly known as: Oxy IR/ROXICODONE Take 1 tablet (5 mg total) by mouth every 6 (six) hours as needed for up to 3 days for severe pain.   polyethylene glycol 17 g packet Commonly known as: MIRALAX / GLYCOLAX Take 17 g by mouth daily.    saccharomyces boulardii 250 MG capsule Commonly known as: Florastor Take 1 capsule (250 mg total) by mouth 2 (two) times daily for 14 days.        Procedures/Studies: No results found.   Subjective: Pt reports that swelling seems to be better and she has been using less IV pain meds and resting better.  She tolerated breakfast well without n/v or abdominal pain.   Discharge Exam: Vitals:   03/31/22 2144 04/01/22 0535  BP: (!) 124/58 (!) 142/72  Pulse: 75 84  Resp: 18 17  Temp: 98.6 F (37 C) 98.4 F (36.9 C)  SpO2: 98% 91%   Vitals:   03/31/22 0959 03/31/22 1333 03/31/22 2144 04/01/22 0535  BP: (!) 103/54 (!) 100/55 (!) 124/58 (!) 142/72  Pulse: 66 73 75 84  Resp: '17 17 18 17  '$ Temp: 97.7 F (36.5 C) 97.8 F (36.6 C) 98.6 F (37 C) 98.4 F (36.9 C)  TempSrc: Oral Oral Oral Oral  SpO2: 100% 100% 98% 91%  Weight:      Height:       General: Pt is alert, awake, not in acute distress Cardiovascular: RRR, S1/S2 +, no rubs, no gallops Respiratory: CTA bilaterally, no wheezing, no rhonchi Abdominal: Soft, NT, ND, bowel sounds + Extremities: right index finger swollen and erythema around puncture site and improving right hand and forearm edema, no areas of spreading erythema seen, appears to be getting better.    The results of significant diagnostics from this hospitalization (including imaging, microbiology, ancillary and laboratory) are listed below for reference.     Microbiology: No results found for this or any previous visit (from the past 240 hour(s)).   Labs: BNP (last 3 results) No results for input(s): "BNP" in the last 8760 hours. Basic Metabolic Panel: Recent Labs  Lab 03/30/22 1955 03/31/22 0053 03/31/22 0502 04/01/22 0500  NA 139 142  --  140  K 3.0* 3.4*  --  4.3  CL 100 110  --  107  CO2 27 26  --  30  GLUCOSE 127* 108*  --  92  BUN 23 19  --  10  CREATININE 1.16* 0.77  --  0.64  CALCIUM 9.9 8.4*  --  8.5*  MG  --   --  2.0  --     Liver Function Tests: Recent Labs  Lab 03/31/22 0053  AST 23  ALT 11  ALKPHOS 32*  BILITOT 0.6  PROT 5.8*  ALBUMIN 3.4*   No results for input(s): "LIPASE", "AMYLASE" in the last 168 hours. No results for input(s): "AMMONIA" in the last 168 hours. CBC: Recent Labs  Lab 03/30/22 1955 03/31/22 0053 03/31/22 0502 04/01/22 0500  WBC 7.3 10.9* 8.8 7.7  NEUTROABS 4.8 8.2* 6.0  --   HGB 13.8 12.3 12.2 12.4  HCT 41.2 38.6 37.9 40.3  MCV 96.0 99.5 98.2 101.8*  PLT 220 175 175 160   Cardiac Enzymes: No results for input(s): "CKTOTAL", "CKMB", "CKMBINDEX", "TROPONINI" in the last 168 hours. BNP: Invalid input(s): "POCBNP" CBG: No results for input(s): "GLUCAP" in the last 168 hours. D-Dimer No results for input(s): "DDIMER" in the last 72 hours. Hgb A1c No results for input(s): "HGBA1C" in the last 72 hours. Lipid Profile No results for input(s): "CHOL", "HDL", "LDLCALC", "TRIG", "CHOLHDL", "LDLDIRECT" in the last 72 hours. Thyroid function studies Recent Labs    03/31/22 0503  TSH 0.306*   Anemia work up No results for input(s): "VITAMINB12", "FOLATE", "FERRITIN", "TIBC", "IRON", "RETICCTPCT" in the last 72 hours. Urinalysis    Component Value Date/Time   COLORURINE YELLOW 07/09/2017 1547   APPEARANCEUR Clear 12/11/2021 1515   LABSPEC 1.023 07/09/2017 1547   PHURINE 5.0 07/09/2017 1547   GLUCOSEU Negative 12/11/2021 1515   HGBUR LARGE (A) 07/09/2017 1547   BILIRUBINUR Negative 12/11/2021 1515   KETONESUR negative 05/21/2021 1251   KETONESUR NEGATIVE 07/09/2017 1547   PROTEINUR Negative 12/11/2021 1515   PROTEINUR >=300 (A) 07/09/2017 1547   UROBILINOGEN 0.2 05/21/2021 1251   UROBILINOGEN 0.2 11/10/2007 1158   NITRITE Negative 12/11/2021 1515   NITRITE NEGATIVE 07/09/2017 1547   LEUKOCYTESUR Negative 12/11/2021 1515   Sepsis Labs Recent Labs  Lab 03/30/22 1955 03/31/22 0053 03/31/22 0502 04/01/22 0500  WBC 7.3 10.9* 8.8 7.7   Microbiology No  results found for this or any previous visit (from the past 240 hour(s)).  Time coordinating discharge: 40 mins  SIGNED:  Irwin Brakeman, MD  [facility] 04/01/2022, 11:56 AM How to contact the [facility] Attending or Consulting provider [place] or covering provider during after hours [place], for this patient?  Check the care team in [facility] and look for a) attending/consulting [facility] provider listed and b) the [facility] team listed Log into [email or web address] and use [place]'s universal password to access. If you do not have the password, please contact the hospital operator. Locate the [facility] provider you are looking for under [facility] and page to a number that you can be directly reached. If you still have difficulty reaching the provider, please page the [facility] (Director on Call) for the Hospitalists listed on amion for assistance.

## 2022-04-01 NOTE — Discharge Instructions (Signed)
PLEASE GO SEE DR. CAIRNS TOMORROW IN THE  OFFICE   PLEASE SOAK RIGHT INDEX FINGER IN HIBICLENS SOLUTION 3-4 TIMES PER DAY     IMPORTANT INFORMATION: PAY CLOSE ATTENTION   PHYSICIAN DISCHARGE INSTRUCTIONS  Follow with Primary care provider  [name]  and other consultants as instructed by your Hospitalist Physician  [place] IF SYMPTOMS COME BACK, WORSEN OR NEW PROBLEM DEVELOPS   Please note: You were cared for by a hospitalist during your hospital stay. Every effort will be made to forward records to your primary care provider.  You can request that your primary care provider send for your hospital records if they have not received them.  Once you are discharged, your primary care physician will handle any further medical issues. Please note that NO REFILLS for any discharge medications will be authorized once you are discharged, as it is imperative that you return to your primary care physician (or establish a relationship with a primary care physician if you do not have one) for your post hospital discharge needs so that they can reassess your need for medications and monitor your lab values.  Please get a complete blood count and chemistry panel checked by your Primary MD at your next visit, and again as instructed by your Primary MD.  Get Medicines reviewed and adjusted: Please take all your medications with you for your next visit with your Primary MD  Laboratory/radiological data: Please request your Primary MD to go over all hospital tests and procedure/radiological results at the follow up, please ask your primary care provider to get all Hospital records sent to his/her office.  In some cases, they will be blood work, cultures and biopsy results pending at the time of your discharge. Please request that your primary care provider follow up on these results.  If you are diabetic, please bring your blood sugar readings  with you to your follow up appointment with primary care.    Please call and make your follow up appointments as soon as possible.    Also Note the following: If you experience worsening of your admission symptoms, develop shortness of breath, life threatening emergency, suicidal or homicidal thoughts you must seek medical attention immediately by calling 911 or calling your MD immediately  if symptoms less severe.  You must read complete instructions/literature along with all the possible adverse reactions/side effects for all the Medicines you take and that have been prescribed to you. Take any new Medicines after you have completely understood and accpet all the possible adverse reactions/side effects.   Do not drive when taking Pain medications or sleeping medications (Benzodiazepines)  Do not take more than prescribed Pain, Sleep and Anxiety Medications. It is not advisable to combine anxiety,sleep and pain medications without talking with your primary care practitioner  Special Instructions: If you have smoked or chewed Tobacco  in the last 2 yrs please stop smoking, stop any regular Alcohol  and or any Recreational drug use.  Wear Seat belts while driving.  Do not drive if taking any narcotic, mind altering or controlled substances or recreational drugs or alcohol.

## 2022-04-02 ENCOUNTER — Other Ambulatory Visit: Payer: Self-pay | Admitting: *Deleted

## 2022-04-02 ENCOUNTER — Encounter: Payer: Self-pay | Admitting: Orthopedic Surgery

## 2022-04-02 ENCOUNTER — Ambulatory Visit: Payer: Medicare HMO | Admitting: Orthopedic Surgery

## 2022-04-02 VITALS — BP 135/74 | HR 84 | Ht 62.0 in | Wt 110.0 lb

## 2022-04-02 DIAGNOSIS — W5911XA Bitten by nonvenomous snake, initial encounter: Secondary | ICD-10-CM

## 2022-04-02 DIAGNOSIS — Z01818 Encounter for other preprocedural examination: Secondary | ICD-10-CM

## 2022-04-02 NOTE — Patient Instructions (Signed)
Plan for intervention on Thursday.  Preop will contact you to discuss timing for surgery.  Nothing to eat or drink after midnight.  Continue with soaks and taking the antibiotic.   If you are getting worse and need to be seen, you can present to the ED.  Please call if it is during office hours.

## 2022-04-02 NOTE — Progress Notes (Signed)
New Patient Visit  Assessment: Terry Allen is a 81 y.o. female with the following: 1. Snake bite, initial encounter  Plan: JOHNESHA ACHEAMPONG was bitten by a snake few days ago.  She was discharged from the hospital yesterday, and has been continue to take antibiotics, as well as continuing with regular soaks.  She has pain, swelling and some redness to the index finger, with generalized swelling extending into the arm.  This has remained constant.  At this point, she is demonstrating signs consistent with flexor tenosynovitis.  As a result, I am recommending irrigation and debridement, with the plan to proceed to surgery 7/27.  If her symptoms improve between now and then, we have the option of canceling surgery.  Otherwise, I will plan to proceed with surgery, to include irrigation of the flexor tendon sheath, as well as I&D of the area of the bite to the ulnar aspect of the index finger.  All questions were answered.  She is in agreement with this plan.  Continue with antibiotics and soaks until surgery.  Follow-up: Return in about 9 days (around 04/11/2022).  Subjective:  Chief Complaint  Patient presents with   Snake Bite    Rt hand index finger DOI 03/30/22, pt feels swelling and pain has increased up the back of the arm since released from the hospital.     History of Present Illness: Terry Allen is a 81 y.o. female who presents for evaluation of right hand pain.  A few days ago, she was in her yard, when she states she was bit by a snake.  She does not know what the snake look like, as she pulled her hand away very quickly.  At first, she thought she was bit by a rat.  Later that evening, she presented to the emergency department.  Based on their evaluation, they felt it was more likely that she was bit by a snake.  She received appropriate treatment.  She was monitored in the hospital over the weekend, and received antibiotics.  She has remained stable.  The  hospitalist contacted me yesterday, concerned that she may be developing an infection.  I advised continued close monitoring, and evaluation in clinic this morning.  She has been completing chlorhexidine soaks, and is currently taking cefdinir.  No issues with the antibiotics.  No fevers or chills, but she states she feels as though the swelling is getting worse into the upper arm.  She has limited motion of her hand.   Review of Systems: No fevers or chills No numbness or tingling No chest pain No shortness of breath No bowel or bladder dysfunction No GI distress No headaches   Medical History:  Past Medical History:  Diagnosis Date   Acid reflux    Patient reports Barretts   Bulging lumbar disc    High cholesterol    Hypothyroidism    Nerve damage    Osteopenia    Pelvic pain in female    due to bladder sling    Past Surgical History:  Procedure Laterality Date   APPENDECTOMY  1960   BIOPSY  11/03/2019   Procedure: BIOPSY;  Surgeon: Rogene Houston, MD;  Location: [facility];  Service: Endoscopy;;   BLADDER SUSPENSION     CATARACT EXTRACTION W/PHACO Right 06/07/2019   Procedure: CATARACT EXTRACTION PHACO AND INTRAOCULAR LENS PLACEMENT ([place]);  Surgeon: Baruch Goldmann, MD;  Location: [facility] ORS;  Service: Ophthalmology;  Laterality: Right;  CDE: 10.87   CATARACT EXTRACTION  W/PHACO Left 06/21/2019   Procedure: CATARACT EXTRACTION PHACO AND INTRAOCULAR LENS PLACEMENT ([facility]);  Surgeon: Baruch Goldmann, MD;  Location: [facility];  Service: Ophthalmology;  Laterality: Left;  CDE: 11.59   COLONOSCOPY N/A 11/03/2019   Procedure: COLONOSCOPY;  Surgeon: Rogene Houston, MD;  [facility]: [facility];  Service: Endoscopy;  Laterality: N/A;  1245   ESOPHAGEAL DILATION N/A 05/27/2018   Procedure: ESOPHAGEAL DILATION;  Surgeon: Rogene Houston, MD;  [facility]: [facility];  Service: Endoscopy;  Laterality: N/A;   ESOPHAGEAL DILATION N/A 02/12/2019   Procedure: ESOPHAGEAL DILATION;  Surgeon:  Rogene Houston, MD;  [facility]: [facility];  Service: Endoscopy;  Laterality: N/A;   ESOPHAGEAL DILATION N/A 02/14/2022   Procedure: ESOPHAGEAL DILATION;  Surgeon: Rogene Houston, MD;  [facility]: [facility];  Service: Endoscopy;  Laterality: N/A;   ESOPHAGEAL DILATION N/A 02/27/2022   Procedure: ESOPHAGEAL DILATION;  Surgeon: Harvel Quale, MD;  [facility];  Service: Gastroenterology;  Laterality: N/A;   ESOPHAGOGASTRODUODENOSCOPY N/A 12/21/2014   Procedure: ESOPHAGOGASTRODUODENOSCOPY (EGD);  Surgeon: Rogene Houston, MD;  [facility];  Service: Endoscopy;  Laterality: N/A;  210   ESOPHAGOGASTRODUODENOSCOPY N/A 06/16/2015   Procedure: ESOPHAGOGASTRODUODENOSCOPY (EGD);  Surgeon: Rogene Houston, MD;  [facility];  Service: Endoscopy;  Laterality: N/A;  1250   ESOPHAGOGASTRODUODENOSCOPY N/A 05/27/2018   Procedure: ESOPHAGOGASTRODUODENOSCOPY (EGD);  Surgeon: Rogene Houston, MD;  [facility];  Service: Endoscopy;  Laterality: N/A;  7:30   ESOPHAGOGASTRODUODENOSCOPY N/A 02/12/2019   Procedure: ESOPHAGOGASTRODUODENOSCOPY (EGD);  Surgeon: Rogene Houston, MD;  [facility];  Service: Endoscopy;  Laterality: N/A;   ESOPHAGOGASTRODUODENOSCOPY (EGD) WITH PROPOFOL N/A 02/14/2022   Procedure: ESOPHAGOGASTRODUODENOSCOPY (EGD) WITH PROPOFOL;  Surgeon: Rogene Houston, MD;  [facility]: [facility];  Service: Endoscopy;  Laterality: N/A;  920   ESOPHAGOGASTRODUODENOSCOPY (EGD) WITH PROPOFOL N/A 02/27/2022   Procedure: ESOPHAGOGASTRODUODENOSCOPY (EGD) WITH PROPOFOL;  Surgeon: Harvel Quale, MD;  [facility];  Service: Gastroenterology;  Laterality: N/A;  1:15 Per Dr Jenetta Downer asa 1    Family History  Problem Relation Age of Onset   Breast cancer Mother    Heart disease Mother    Diabetes Mother    Cancer Mother        breast   Stroke Mother    Hypertension Mother    Lung cancer Father    Stroke Father     Cancer Father        oat cell cancer/lung   Kidney cancer Brother    Cancer Brother        renal cell cancer   Healthy Brother    Diabetes Son    Social History   Tobacco Use   Smoking status: Never    Passive exposure: Never   Smokeless tobacco: Never  Vaping Use   Vaping Use: Never used  Substance Use Topics   Alcohol use: No    Alcohol/week: 0.0 standard drinks of alcohol   Drug use: No    No Known Allergies  No outpatient medications have been marked as taking for the 04/02/22 encounter (Office Visit) with [name], MD.    Objective: BP 135/74   Pulse 84   Ht '5\' 2"'$  (1.575 m)   Wt 110 lb (49.9 kg)   BMI 20.12 kg/m   Physical Exam:  General: Alert and oriented. and No acute distress. Gait: Normal gait.  Evaluation of the right hand demonstrates  diffuse swelling, specifically to the index finger.  The she does have redness to the distal aspect of the index finger.  Tenderness to palpation along the volar aspect of the finger, in line with the flexor tendon.  No fluctuance is appreciated.  Restricted range of motion in the index finger.       IMAGING: No new imaging obtained today   New Medications:  No orders of the defined types were placed in this encounter.     [name], MD  04/02/2022 10:09 PM

## 2022-04-02 NOTE — Patient Outreach (Signed)
  Care Coordination TOC Note Transition Care Management Unsuccessful Follow-up Telephone Call  Date of discharge and from where:  [phone] [facility]  Attempts:  1st Attempt  Reason for unsuccessful TCM follow-up call:  Unable to leave message/ voice mail box not set up yet.   [place] Management [phone].

## 2022-04-02 NOTE — Patient Outreach (Signed)
  Care Coordination TOC Note Transition Care Management Unsuccessful Follow-up Telephone Call  Date of discharge and from where:  [facility] [phone]  Attempts:  2nd Attempt  Reason for unsuccessful TCM follow-up call:  Unable to leave message   [place] Management [phone]

## 2022-04-03 ENCOUNTER — Encounter (HOSPITAL_COMMUNITY)
Admission: RE | Admit: 2022-04-03 | Discharge: 2022-04-03 | Disposition: A | Discharge status: Home / Self Care | Payer: Medicare HMO | Source: Ambulatory Visit | Attending: Orthopedic Surgery | Admitting: Orthopedic Surgery

## 2022-04-03 ENCOUNTER — Ambulatory Visit: Payer: Self-pay | Admitting: *Deleted

## 2022-04-03 ENCOUNTER — Telehealth: Payer: Self-pay | Admitting: Orthopedic Surgery

## 2022-04-03 NOTE — Telephone Encounter (Signed)
Call received from Short stay at [facility], per [place] - ph [phone] - relays that she called patient to do pre-op, and said patient relayed that her finger is better, and that she does not want to have surgery. Please advise as soon as possible.

## 2022-04-03 NOTE — Telephone Encounter (Signed)
Patient called also, 4:30pm  - requesting to cancel tomorrow's surgery - states feels better and does not think she needs surgery. routing note to Dr Amedeo Kinsman

## 2022-04-03 NOTE — Patient Outreach (Signed)
  Care Coordination [facility] Note Transition Care Management Unsuccessful Follow-up Telephone Call  Date of discharge and from where:  04/01/22 [place]  Attempts:  3rd Attempt  Reason for unsuccessful TCM follow-up call:  Unable to leave message  NO MAILBOX   Hubert Azure RN, MSN RN Care Management Coordinator  [place] [phone] [email or web address]

## 2022-04-03 NOTE — Progress Notes (Signed)
Patient called to cancel procedure - I&D  Right Index Finger for 04/04/2022.  Patient states that  her finger is much better and she no longer needs the surgery.  Dr Amedeo Kinsman office notified

## 2022-04-04 ENCOUNTER — Ambulatory Visit (HOSPITAL_COMMUNITY): Admission: RE | Admit: 2022-04-04 | Payer: Medicare HMO | Source: Home / Self Care | Admitting: Orthopedic Surgery

## 2022-04-04 ENCOUNTER — Encounter (HOSPITAL_COMMUNITY): Admission: RE | Payer: Self-pay | Source: Home / Self Care

## 2022-04-04 SURGERY — INCISION AND DRAINAGE
Anesthesia: General | Laterality: Right

## 2022-04-05 NOTE — Telephone Encounter (Signed)
Noted  

## 2022-04-09 ENCOUNTER — Ambulatory Visit: Payer: Medicare HMO | Admitting: Orthopedic Surgery

## 2022-04-09 ENCOUNTER — Encounter: Payer: Self-pay | Admitting: Orthopedic Surgery

## 2022-04-09 DIAGNOSIS — M79641 Pain in right hand: Secondary | ICD-10-CM

## 2022-04-09 DIAGNOSIS — W5911XD Bitten by nonvenomous snake, subsequent encounter: Secondary | ICD-10-CM

## 2022-04-09 NOTE — Patient Instructions (Signed)
If symptoms worsen, please call the clinic for a follow up

## 2022-04-10 NOTE — Progress Notes (Signed)
New Patient Visit  Assessment: Terry Allen is a 81 y.o. female with the following: 1. Snake bite, initial encounter  Plan: MARIANITA BOTKIN was bitten by a snake approximately 10 days ago.  She is doing better.  Swelling has improved.  Mobility has gotten better.  I think canceling surgery was appropriate.  She will continue the antibiotics until completion.  She can stop with the daily soaks.  If she has further issues, I have asked her to contact the clinic.  She is aware that I am out of the office next week.  As such, I have updated the picture in the chart, so my partners can compare any complaints if she returns to the office.  Follow-up: Return if symptoms worsen or fail to improve.  Subjective:  Chief Complaint  Patient presents with   Hand Injury    Snake bite to finger/ improving     History of Present Illness: Terry Allen is a 81 y.o. female who returns for evaluation of right hand pain.  She was bit by a snake approximately 10 days ago.  She was scheduled for surgery, including I&D and irrigation of the flexor tendon last week.  She stated that she was improving, and surgery was canceled by her.  Since then, she continues to get better.  She has near full range of motion of the index finger.  No fevers or chills.  She has taken the antibiotics without issue.  She has been completing soaks on a daily basis.  She is pleased with her progress since I last saw her in clinic.   Review of Systems: No fevers or chills No numbness or tingling No chest pain No shortness of breath No bowel or bladder dysfunction No GI distress No headaches   Objective: There were no vitals taken for this visit.  Physical Exam:  General: Alert and oriented. and No acute distress. Gait: Normal gait.  Near full range of motion of the right index finger.  Laceration and bite marks are improving.  No fluctuance.  Mild tenderness to palpation, limited to the DIP and  distal.  No tenderness to palpation along the volar aspect of the index finger and the MCP area.  General swelling and redness into the arm has gotten a lot better.  2+ radial pulse.  Picture from 04/09/2022:      IMAGING: No new imaging obtained today   New Medications:  No orders of the defined types were placed in this encounter.     [name], MD  04/10/2022 3:40 PM

## 2022-04-11 ENCOUNTER — Encounter (INDEPENDENT_AMBULATORY_CARE_PROVIDER_SITE_OTHER): Payer: Self-pay

## 2022-04-22 DIAGNOSIS — H52229 Regular astigmatism, unspecified eye: Secondary | ICD-10-CM | POA: Diagnosis not present

## 2022-04-22 DIAGNOSIS — Z0100 Encounter for examination of eyes and vision without abnormal findings: Secondary | ICD-10-CM | POA: Diagnosis not present

## 2022-05-01 DIAGNOSIS — R5383 Other fatigue: Secondary | ICD-10-CM | POA: Diagnosis not present

## 2022-05-01 DIAGNOSIS — E039 Hypothyroidism, unspecified: Secondary | ICD-10-CM | POA: Diagnosis not present

## 2022-05-01 DIAGNOSIS — R300 Dysuria: Secondary | ICD-10-CM | POA: Diagnosis not present

## 2022-06-12 ENCOUNTER — Emergency Department (HOSPITAL_COMMUNITY): Payer: Medicare HMO

## 2022-06-12 ENCOUNTER — Encounter (HOSPITAL_COMMUNITY): Payer: Self-pay | Admitting: Emergency Medicine

## 2022-06-12 ENCOUNTER — Other Ambulatory Visit: Payer: Self-pay

## 2022-06-12 ENCOUNTER — Emergency Department (HOSPITAL_COMMUNITY)
Admission: EM | Admit: 2022-06-12 | Discharge: 2022-06-12 | Disposition: A | Discharge status: Home / Self Care | Payer: Medicare HMO | Attending: Emergency Medicine | Admitting: Emergency Medicine

## 2022-06-12 DIAGNOSIS — R10819 Abdominal tenderness, unspecified site: Secondary | ICD-10-CM | POA: Diagnosis present

## 2022-06-12 DIAGNOSIS — J449 Chronic obstructive pulmonary disease, unspecified: Secondary | ICD-10-CM | POA: Diagnosis not present

## 2022-06-12 DIAGNOSIS — R0602 Shortness of breath: Secondary | ICD-10-CM | POA: Diagnosis not present

## 2022-06-12 DIAGNOSIS — R059 Cough, unspecified: Secondary | ICD-10-CM | POA: Diagnosis not present

## 2022-06-12 DIAGNOSIS — N3000 Acute cystitis without hematuria: Secondary | ICD-10-CM | POA: Insufficient documentation

## 2022-06-12 DIAGNOSIS — R42 Dizziness and giddiness: Secondary | ICD-10-CM | POA: Diagnosis not present

## 2022-06-12 DIAGNOSIS — R300 Dysuria: Secondary | ICD-10-CM | POA: Diagnosis not present

## 2022-06-12 DIAGNOSIS — Z681 Body mass index (BMI) 19 or less, adult: Secondary | ICD-10-CM | POA: Diagnosis not present

## 2022-06-12 LAB — URINALYSIS, ROUTINE W REFLEX MICROSCOPIC
Bilirubin Urine: NEGATIVE
Glucose, UA: NEGATIVE mg/dL
Ketones, ur: NEGATIVE mg/dL
Nitrite: NEGATIVE
Protein, ur: 30 mg/dL — AB
Specific Gravity, Urine: 1.002 — ABNORMAL LOW (ref 1.005–1.030)
Trans Epithel, UA: 1
WBC, UA: >50 WBC/hpf — ABNORMAL HIGH (ref 0–5)
pH: 8 (ref 5.0–8.0)

## 2022-06-12 LAB — BASIC METABOLIC PANEL
Anion gap: 11 (ref 5–15)
BUN: 15 mg/dL (ref 8–23)
CO2: 30 mmol/L (ref 22–32)
Calcium: 9.2 mg/dL (ref 8.9–10.3)
Chloride: 98 mmol/L (ref 98–111)
Creatinine, Ser: 0.95 mg/dL (ref 0.44–1.00)
GFR, Estimated: >60 mL/min (ref >60)
Glucose, Bld: 126 mg/dL — ABNORMAL HIGH (ref 70–99)
Potassium: 3 mmol/L — ABNORMAL LOW (ref 3.5–5.1)
Sodium: 139 mmol/L (ref 135–145)

## 2022-06-12 LAB — HEPATIC FUNCTION PANEL
ALT: 14 U/L (ref 0–44)
AST: 28 U/L (ref 15–41)
Albumin: 3.2 g/dL — ABNORMAL LOW (ref 3.5–5.0)
Alkaline Phosphatase: 48 U/L (ref 38–126)
Bilirubin, Direct: 0.1 mg/dL (ref 0.0–0.2)
Indirect Bilirubin: 0.6 mg/dL (ref 0.3–0.9)
Total Bilirubin: 0.7 mg/dL (ref 0.3–1.2)
Total Protein: 6.7 g/dL (ref 6.5–8.1)

## 2022-06-12 LAB — CBC
HCT: 32.4 % — ABNORMAL LOW (ref 36.0–46.0)
Hemoglobin: 10.1 g/dL — ABNORMAL LOW (ref 12.0–15.0)
MCH: 30.4 pg (ref 26.0–34.0)
MCHC: 31.2 g/dL (ref 30.0–36.0)
MCV: 97.6 fL (ref 80.0–100.0)
Platelets: 277 10*3/uL (ref 150–400)
RBC: 3.32 MIL/uL — ABNORMAL LOW (ref 3.87–5.11)
RDW: 13.6 % (ref 11.5–15.5)
WBC: 6.6 10*3/uL (ref 4.0–10.5)
nRBC: 0 % (ref 0.0–0.2)

## 2022-06-12 LAB — MAGNESIUM: Magnesium: 2.1 mg/dL (ref 1.7–2.4)

## 2022-06-12 MED ORDER — SULFAMETHOXAZOLE-TRIMETHOPRIM 800-160 MG PO TABS
1.0000 | ORAL_TABLET | Freq: Once | ORAL | Status: AC
  Administered 2022-06-12: 1 via ORAL
  Filled 2022-06-12: qty 1

## 2022-06-12 MED ORDER — ALBUTEROL SULFATE HFA 108 (90 BASE) MCG/ACT IN AERS: 2.0000 | INHALATION_SPRAY | RESPIRATORY_TRACT | 3 refills | Status: DC | PRN

## 2022-06-12 MED ORDER — SULFAMETHOXAZOLE-TRIMETHOPRIM 800-160 MG PO TABS: 1.0000 | ORAL_TABLET | Freq: Two times a day (BID) | ORAL | 0 refills | Status: AC

## 2022-06-12 NOTE — ED Provider Notes (Signed)
[facility] EMERGENCY DEPARTMENT Provider Note   CSN: [id number] Arrival date & time: 06/12/22  1246     History  Chief Complaint  Patient presents with   Dizziness    Terry Allen is a 81 y.o. female.   Dizziness  Patient is an 81 year old female presenting with multiple complaints including chronic weakness, chronic dizziness chronic knee pain chronic abdominal pain, chronic UTIs but was sent here from her doctor's office because of an oxygen level of 60% that was reported from the office.  On arrival the patient's oxygen is 100% on room air without any shortness of breath symptoms whatsoever.  She has had no fevers or chills, no nausea vomiting or diarrhea.    Home Medications Prior to Admission medications   Medication Sig Start Date End Date Taking? Authorizing Provider  acetaminophen (TYLENOL) 325 MG tablet Take 2 tablets (650 mg total) by mouth every 6 (six) hours as needed for mild pain (or Fever >/= 101). 04/01/22  Yes Johnson, Clanford L, MD  albuterol (VENTOLIN HFA) 108 (90 Base) MCG/ACT inhaler Inhale 2 puffs into the lungs every 4 (four) hours as needed for wheezing or shortness of breath. 06/12/22  Yes Noemi Chapel, MD  amitriptyline (ELAVIL) 50 MG tablet Take 50 mg by mouth at bedtime.  04/12/18  Yes [provider]  atorvastatin (LIPITOR) 40 MG tablet Take 40 mg by mouth at bedtime.    Yes [provider]  Calcium Carb-Cholecalciferol (CALCIUM 600 + D PO) Take 1 tablet by mouth 2 (two) times a day.   Yes [provider]  famotidine (PEPCID) 20 MG tablet Take 20 mg by mouth 2 (two) times daily. 04/16/22  Yes [provider]  gabapentin (NEURONTIN) 300 MG capsule Take 1 capsule (300 mg total) by mouth 3 (three) times daily. 04/01/22  Yes Johnson, Clanford L, MD  levothyroxine (SYNTHROID) 50 MCG tablet Take 50 mcg by mouth daily before breakfast.   Yes [provider]  loratadine (ALLERGY) 10 MG tablet Take 10 mg by mouth  daily.   Yes [provider]  Multiple Vitamin (MULTIVITAMIN) tablet Take 1 tablet by mouth daily.   Yes [provider]  sulfamethoxazole-trimethoprim (BACTRIM DS) 800-160 MG tablet Take 1 tablet by mouth 2 (two) times daily for 7 days. 06/12/22 06/19/22 Yes Noemi Chapel, MD  amoxicillin (AMOXIL) 250 MG capsule Take 250 mg by mouth 3 (three) times daily. Patient not taking: Reported on 06/12/2022 05/17/22   [provider]  cholecalciferol (VITAMIN D3) 25 MCG (1000 UNIT) tablet Take 1,000 Units by mouth daily.    [provider]  meloxicam (MOBIC) 15 MG tablet Take 15 mg by mouth daily. Patient not taking: Reported on 06/12/2022 04/16/22   [provider]      Allergies    Patient has no known allergies.    Review of Systems   Review of Systems  Neurological:  Positive for dizziness.  All other systems reviewed and are negative.   Physical Exam Updated Vital Signs BP 132/70   Pulse (!) 59   Temp 98 F (36.7 C) (Oral)   Resp (!) 21   Ht 1.575 m ('5\' 2"'$ )   Wt 44.9 kg   SpO2 100%   BMI 18.11 kg/m  Physical Exam Vitals and nursing note reviewed.  Constitutional:      General: She is not in acute distress.    Appearance: She is well-developed.  HENT:     Head: Normocephalic and atraumatic.  Mouth/Throat:     Pharynx: No oropharyngeal exudate.  Eyes:     General: No scleral icterus.       Right eye: No discharge.        Left eye: No discharge.     Conjunctiva/sclera: Conjunctivae normal.     Pupils: Pupils are equal, round, and reactive to light.  Neck:     Thyroid: No thyromegaly.     Vascular: No JVD.  Cardiovascular:     Rate and Rhythm: Normal rate and regular rhythm.     Heart sounds: Normal heart sounds. No murmur heard.    No friction rub. No gallop.  Pulmonary:     Effort: Pulmonary effort is normal. No respiratory distress.     Breath sounds: Normal breath sounds. No wheezing or rales.  Abdominal:     General: Bowel  sounds are normal. There is no distension.     Palpations: Abdomen is soft. There is no mass.     Tenderness: There is abdominal tenderness.  Musculoskeletal:        General: No tenderness. Normal range of motion.     Cervical back: Normal range of motion and neck supple.     Right lower leg: No edema.     Left lower leg: No edema.  Lymphadenopathy:     Cervical: No cervical adenopathy.  Skin:    General: Skin is warm and dry.     Findings: No erythema or rash.  Neurological:     Mental Status: She is alert.     Coordination: Coordination normal.     Comments: Speech is clear, cranial nerves III through XII are intact, memory is intact, strength is normal in all 4 extremities including grips, sensation is intact to light touch and pinprick in all 4 extremities. Coordination as tested by finger-nose-finger is normal, no limb ataxia. Normal gait, normal reflexes at the patellar tendons bilaterally  Psychiatric:        Behavior: Behavior normal.     ED Results / Procedures / Treatments   Labs (all labs ordered are listed, but only abnormal results are displayed) Labs Reviewed  BASIC METABOLIC PANEL - Abnormal; Notable for the following components:      Result Value   Potassium 3.0 (*)    Glucose, Bld 126 (*)    All other components within normal limits  CBC - Abnormal; Notable for the following components:   RBC 3.32 (*)    Hemoglobin 10.1 (*)    HCT 32.4 (*)    All other components within normal limits  URINALYSIS, ROUTINE W REFLEX MICROSCOPIC - Abnormal; Notable for the following components:   APPearance HAZY (*)    Specific Gravity, Urine 1.002 (*)    Hgb urine dipstick MODERATE (*)    Protein, ur 30 (*)    Leukocytes,Ua LARGE (*)    WBC, UA >50 (*)    Bacteria, UA MANY (*)    All other components within normal limits  HEPATIC FUNCTION PANEL - Abnormal; Notable for the following components:   Albumin 3.2 (*)    All other components within normal limits  URINE CULTURE   MAGNESIUM  CBG MONITORING, ED    EKG EKG Interpretation  Date/Time:  Wednesday June 12 2022 13:12:16 EDT Ventricular Rate:  72 PR Interval:  156 QRS Duration: 130 QT Interval:  398 QTC Calculation: 435 R Axis:   54 Text Interpretation: Normal sinus rhythm Right bundle branch block Abnormal ECG When compared with ECG of 19-Dec-2021 12:02,  No significant change was found Confirmed by Dorie Rank [phone]) on 06/12/2022 1:16:45 PM  Radiology DG Chest 2 View  Result Date: 06/12/2022 CLINICAL DATA:  Cough, shortness of breath, dizziness EXAM: CHEST - 2 VIEW COMPARISON:  Previous chest radiographs and CT done on 12/19/2021 FINDINGS: Cardiac size is within normal limits. Increase in AP diameter of chest suggests COPD. There are no signs of pulmonary edema or focal pulmonary consolidation. There is no significant pleural effusion or pneumothorax. Dextroscoliosis is seen in thoracic spine. IMPRESSION: COPD. There are no signs of pulmonary edema or focal pulmonary consolidation. Electronically Signed   By: Elmer Picker M.D.   On: 06/12/2022 16:42    Procedures Procedures    Medications Ordered in ED Medications  sulfamethoxazole-trimethoprim (BACTRIM DS) 800-160 MG per tablet 1 tablet (1 tablet Oral Given 06/12/22 1848)    ED Course/ Medical Decision Making/ A&P                           Medical Decision Making Amount and/or Complexity of Data Reviewed Labs: ordered. Radiology: ordered. ECG/medicine tests: ordered.  Risk Prescription drug management.   Mild lower abdominal tenderness, the patient otherwise has unremarkable exam.  She has no edema, she has normal vital signs, her oxygen is 1% and the question as to what is causing her overall symptoms is probably not answered while in the emergency department.  However the patient and her family member open up another can of worms when they tell me that she has been dizzy intermittently over the last month, she has had several  falls related to this where she feels weak, states that she gets intermittent blurry vision and then falls.  She is not feeling that at this time and has no focal neurologic findings on my exam.  Imaging: COPD, I agree with radiologist interpretation I personally viewed and interpreted the x-ray.  Labs: No significant leukocytosis or metabolic abnormalities, there is a urinary tract infection.  Medication management: Bactrim started, albuterol prescription given for home as the patient has some underlying COPD though not currently short of breath.   Course: Patient well-appearing, stable for discharge, vital signs reviewed and normal, oxygen 100%, not short of breath at all at this time.  Social determinants of health: Prior smoker  Test considered CT scan however the patient has no shortness of breath and normal oxygen making pulmonary embolism much less likely.         Final Clinical Impression(s) / ED Diagnoses Final diagnoses:  Acute cystitis without hematuria    Rx / DC Orders ED Discharge Orders          Ordered    albuterol (VENTOLIN HFA) 108 (90 Base) MCG/ACT inhaler  Every 4 hours PRN        06/12/22 1902    sulfamethoxazole-trimethoprim (BACTRIM DS) 800-160 MG tablet  2 times daily        06/12/22 Raynald Blend, MD 06/12/22 1904

## 2022-06-12 NOTE — Discharge Instructions (Signed)
Your testing today shows that you still have a urinary tract infection.  For this I would like for you to take the medication called Bactrim, take 1 tablet twice a day for 7 days, if you are not getting any better you will need to see your doctor but come back to the ER for severe or worsening symptoms.  You likely do have some COPD which is a chronic lung disease.  If you do feel short of breath or wheezing you may take the following medication  Albuterol is an inhaled medication which can help you to breathe better, you should take 2 puffs every 4 hours as needed, this may cause your heart to feel like it is racing, this should be a temporary side effect.  Thank you for allowing [place] to treat you in the emergency department today.  After reviewing your examination and potential testing that was done it appears that you are safe to go home.  I would like for you to follow-up with your doctor within the next several days, have them obtain your results and follow-up with them to review all of these tests.  If you should develop severe or worsening symptoms return to the emergency department immediately

## 2022-06-12 NOTE — ED Notes (Signed)
Pt refusing I&O cath, states "I can go to the bathroom" "I don't want that"

## 2022-06-12 NOTE — ED Triage Notes (Signed)
Pt to ER with c/o weakness, blurry vision and dizziness x 1 month.  Pt states was told by [facility] to come here.

## 2022-06-14 LAB — URINE CULTURE: Culture: >=100000 — AB

## 2022-06-15 ENCOUNTER — Telehealth (HOSPITAL_BASED_OUTPATIENT_CLINIC_OR_DEPARTMENT_OTHER): Payer: Self-pay | Admitting: *Deleted

## 2022-06-15 NOTE — Telephone Encounter (Signed)
Post ED Visit - Positive Culture Follow-up  Culture report reviewed by antimicrobial stewardship pharmacist: [place] [facility] '[]'$  Elenor Quinones, Pharm.D. '[]'$  Heide Guile, Pharm.D., BCPS AQ-ID '[]'$  Parks Neptune, Pharm.D., BCPS '[]'$  Alycia Rossetti, Pharm.D., BCPS '[]'$  [place], Pharm.D., BCPS, AAHIVP '[]'$  Legrand Como, Pharm.D., BCPS, AAHIVP '[]'$  Salome Arnt, PharmD, BCPS '[]'$  Johnnette Gourd, PharmD, BCPS '[]'$  Hughes Better, PharmD, BCPS '[]'$  Leeroy Cha, PharmD '[]'$  Laqueta Linden, PharmD, BCPS '[x]'$  Franchot Gallo, PharmD  [place] Team '[]'$  Leodis Sias, PharmD '[]'$  Lindell Spar, PharmD '[]'$  Royetta Asal, PharmD '[]'$  Graylin Shiver, Rph '[]'$  Rema Fendt) Glennon Mac, PharmD '[]'$  Arlyn Dunning, PharmD '[]'$  Netta Cedars, PharmD '[]'$  Dia Sitter, PharmD '[]'$  Leone Haven, PharmD '[]'$  Gretta Arab, PharmD '[]'$  Theodis Shove, PharmD '[]'$  Peggyann Juba, PharmD '[]'$  Reuel Boom, PharmD   Positive urine culture Treated with Sulfamethoxazole-Trimethoprim, organism sensitive to the same and no further patient follow-up is required at this time.  Rosie Fate 06/15/2022, 1:12 PM

## 2022-06-26 ENCOUNTER — Emergency Department (HOSPITAL_COMMUNITY): Payer: Medicare HMO

## 2022-06-26 ENCOUNTER — Emergency Department (HOSPITAL_COMMUNITY)
Admission: EM | Admit: 2022-06-26 | Discharge: 2022-06-26 | Disposition: A | Discharge status: Home / Self Care | Payer: Medicare HMO | Attending: Emergency Medicine | Admitting: Emergency Medicine

## 2022-06-26 ENCOUNTER — Encounter (HOSPITAL_COMMUNITY): Payer: Self-pay

## 2022-06-26 ENCOUNTER — Other Ambulatory Visit: Payer: Self-pay

## 2022-06-26 DIAGNOSIS — R110 Nausea: Secondary | ICD-10-CM | POA: Insufficient documentation

## 2022-06-26 DIAGNOSIS — E039 Hypothyroidism, unspecified: Secondary | ICD-10-CM | POA: Insufficient documentation

## 2022-06-26 DIAGNOSIS — R0789 Other chest pain: Secondary | ICD-10-CM | POA: Diagnosis not present

## 2022-06-26 DIAGNOSIS — R911 Solitary pulmonary nodule: Secondary | ICD-10-CM | POA: Diagnosis not present

## 2022-06-26 DIAGNOSIS — E876 Hypokalemia: Secondary | ICD-10-CM | POA: Insufficient documentation

## 2022-06-26 DIAGNOSIS — J449 Chronic obstructive pulmonary disease, unspecified: Secondary | ICD-10-CM | POA: Diagnosis not present

## 2022-06-26 DIAGNOSIS — Z79899 Other long term (current) drug therapy: Secondary | ICD-10-CM | POA: Insufficient documentation

## 2022-06-26 DIAGNOSIS — R079 Chest pain, unspecified: Secondary | ICD-10-CM | POA: Diagnosis not present

## 2022-06-26 LAB — CBC
HCT: 38.4 % (ref 36.0–46.0)
Hemoglobin: 12.5 g/dL (ref 12.0–15.0)
MCH: 30.3 pg (ref 26.0–34.0)
MCHC: 32.6 g/dL (ref 30.0–36.0)
MCV: 93.2 fL (ref 80.0–100.0)
Platelets: 322 10*3/uL (ref 150–400)
RBC: 4.12 MIL/uL (ref 3.87–5.11)
RDW: 14.1 % (ref 11.5–15.5)
WBC: 5 10*3/uL (ref 4.0–10.5)
nRBC: 0 % (ref 0.0–0.2)

## 2022-06-26 LAB — TROPONIN I (HIGH SENSITIVITY)
Troponin I (High Sensitivity): 8 ng/L (ref <18)
Troponin I (High Sensitivity): 8 ng/L (ref <18)

## 2022-06-26 LAB — BASIC METABOLIC PANEL
Anion gap: 13 (ref 5–15)
BUN: 31 mg/dL — ABNORMAL HIGH (ref 8–23)
CO2: 25 mmol/L (ref 22–32)
Calcium: 9.8 mg/dL (ref 8.9–10.3)
Chloride: 97 mmol/L — ABNORMAL LOW (ref 98–111)
Creatinine, Ser: 0.68 mg/dL (ref 0.44–1.00)
GFR, Estimated: >60 mL/min (ref >60)
Glucose, Bld: 126 mg/dL — ABNORMAL HIGH (ref 70–99)
Potassium: 3.1 mmol/L — ABNORMAL LOW (ref 3.5–5.1)
Sodium: 135 mmol/L (ref 135–145)

## 2022-06-26 MED ORDER — POTASSIUM CHLORIDE CRYS ER 20 MEQ PO TBCR: 20.0000 meq | EXTENDED_RELEASE_TABLET | Freq: Two times a day (BID) | ORAL | 0 refills | Status: DC

## 2022-06-26 MED ORDER — ONDANSETRON 4 MG PO TBDP: 4.0000 mg | ORAL_TABLET | Freq: Three times a day (TID) | ORAL | 1 refills | Status: DC | PRN

## 2022-06-26 NOTE — ED Triage Notes (Signed)
Pt presents to ED with complaints of mid chest pain and nausea started 3 days ago. Pt and family states she has quit taking her medications including thyroid medication.

## 2022-06-26 NOTE — ED Provider Notes (Signed)
[facility] EMERGENCY DEPARTMENT Provider Note   CSN: [id number] Arrival date & time: 06/26/22  1725     History  Chief Complaint  Patient presents with   Chest Pain    Terry Allen is a 81 y.o. female.  Patient with a substernal chest pain for the past 3 days.  Associated with nausea but no vomiting.  Patient's family states the patient quit taking her medication including her thyroid medication.  Patient is not currently followed by cardiology.  Past medical history significant for lumbar disc hypothyroidism.  Patient according to family was evaluated for chest pain 3 months ago without any specific findings.  Patient never smoked.  Looks like that was in April that she was actually admitted but with no acute findings.  Patient is not following up with cardiology.  Interesting chart review shows that patient was evaluated in the ED October 4 for bladder infection and patient seen July 22 of an admission for snakebite.       Home Medications Prior to Admission medications   Medication Sig Start Date End Date Taking? Authorizing Provider  ondansetron (ZOFRAN-ODT) 4 MG disintegrating tablet Take 1 tablet (4 mg total) by mouth every 8 (eight) hours as needed for nausea or vomiting. 06/26/22  Yes Fredia Sorrow, MD  potassium chloride SA (KLOR-CON M) 20 MEQ tablet Take 1 tablet (20 mEq total) by mouth 2 (two) times daily. 06/26/22  Yes Fredia Sorrow, MD  acetaminophen (TYLENOL) 325 MG tablet Take 2 tablets (650 mg total) by mouth every 6 (six) hours as needed for mild pain (or Fever >/= 101). 04/01/22   Johnson, Clanford L, MD  albuterol (VENTOLIN HFA) 108 (90 Base) MCG/ACT inhaler Inhale 2 puffs into the lungs every 4 (four) hours as needed for wheezing or shortness of breath. 06/12/22   Noemi Chapel, MD  amitriptyline (ELAVIL) 50 MG tablet Take 50 mg by mouth at bedtime.  04/12/18   [provider]  amoxicillin (AMOXIL) 250 MG capsule Take 250 mg by mouth 3 (three)  times daily. Patient not taking: Reported on 06/12/2022 05/17/22   [provider]  atorvastatin (LIPITOR) 40 MG tablet Take 40 mg by mouth at bedtime.     [provider]  Calcium Carb-Cholecalciferol (CALCIUM 600 + D PO) Take 1 tablet by mouth 2 (two) times a day.    [provider]  cholecalciferol (VITAMIN D3) 25 MCG (1000 UNIT) tablet Take 1,000 Units by mouth daily.    [provider]  famotidine (PEPCID) 20 MG tablet Take 20 mg by mouth 2 (two) times daily. 04/16/22   [provider]  gabapentin (NEURONTIN) 300 MG capsule Take 1 capsule (300 mg total) by mouth 3 (three) times daily. 04/01/22   Johnson, Clanford L, MD  levothyroxine (SYNTHROID) 50 MCG tablet Take 50 mcg by mouth daily before breakfast.    [provider]  loratadine (ALLERGY) 10 MG tablet Take 10 mg by mouth daily.    [provider]  meloxicam (MOBIC) 15 MG tablet Take 15 mg by mouth daily. Patient not taking: Reported on 06/12/2022 04/16/22   [provider]  Multiple Vitamin (MULTIVITAMIN) tablet Take 1 tablet by mouth daily.    [provider]      Allergies    Patient has no known allergies.    Review of Systems   Review of Systems  Constitutional:  Negative for chills and fever.  HENT:  Negative for ear pain and sore throat.   Eyes:  Negative for pain and visual disturbance.  Respiratory:  Negative for cough and shortness of breath.   Cardiovascular:  Positive for chest pain. Negative for palpitations.  Gastrointestinal:  Positive for nausea. Negative for abdominal pain and vomiting.  Genitourinary:  Negative for dysuria and hematuria.  Musculoskeletal:  Negative for arthralgias and back pain.  Skin:  Negative for color change and rash.  Neurological:  Negative for seizures and syncope.  All other systems reviewed and are negative.   Physical Exam Updated Vital Signs BP 127/76   Pulse 76   Temp 98.1 F (36.7 C) (Oral)   Resp (!)  25   Ht 1.575 m ('5\' 2"'$ )   Wt 45.4 kg   SpO2 99%   BMI 18.29 kg/m  Physical Exam Vitals and nursing note reviewed.  Constitutional:      General: She is not in acute distress.    Appearance: She is well-developed.  HENT:     Head: Normocephalic and atraumatic.  Eyes:     Extraocular Movements: Extraocular movements intact.     Conjunctiva/sclera: Conjunctivae normal.     Pupils: Pupils are equal, round, and reactive to light.  Cardiovascular:     Rate and Rhythm: Normal rate and regular rhythm.     Heart sounds: No murmur heard. Pulmonary:     Effort: Pulmonary effort is normal. No respiratory distress.     Breath sounds: Normal breath sounds. No decreased breath sounds, wheezing, rhonchi or rales.  Chest:     Chest wall: No tenderness.  Abdominal:     Palpations: Abdomen is soft.     Tenderness: There is no abdominal tenderness.  Musculoskeletal:        General: No swelling.     Cervical back: Neck supple.     Right lower leg: No edema.     Left lower leg: No edema.  Skin:    General: Skin is warm and dry.     Capillary Refill: Capillary refill takes less than 2 seconds.  Neurological:     General: No focal deficit present.     Mental Status: She is alert and oriented to person, place, and time.  Psychiatric:        Mood and Affect: Mood normal.     ED Results / Procedures / Treatments   Labs (all labs ordered are listed, but only abnormal results are displayed) Labs Reviewed  BASIC METABOLIC PANEL - Abnormal; Notable for the following components:      Result Value   Potassium 3.1 (*)    Chloride 97 (*)    Glucose, Bld 126 (*)    BUN 31 (*)    All other components within normal limits  CBC  TSH  TROPONIN I (HIGH SENSITIVITY)  TROPONIN I (HIGH SENSITIVITY)    EKG EKG Interpretation  Date/Time:  Wednesday June 26 2022 17:38:56 EDT Ventricular Rate:  99 PR Interval:  150 QRS Duration: 118 QT Interval:  356 QTC Calculation: 456 R Axis:   81 Text  Interpretation: Normal sinus rhythm Right atrial enlargement Right bundle branch block Abnormal ECG When compared with ECG of 12-Jun-2022 13:12, T wave amplitude has increased in Lateral leads Confirmed by Fredia Sorrow [phone]) on 06/26/2022 10:56:46 PM  Radiology DG Chest 2 View  Result Date: 06/26/2022 CLINICAL DATA:  Chest pain EXAM: CHEST - 2 VIEW COMPARISON:  06/12/2022 FINDINGS: Cardiac size is within normal limits. Increase in AP diameter of chest suggests COPD. There are no signs of pulmonary edema or focal  pulmonary consolidation. In the PA view, there is a faint 1 cm radiopacity in the left lower lung field adjacent to the left costophrenic angle. This finding could not be localized in the lateral view. There is no pleural effusion or pneumothorax. Dextroscoliosis is seen in upper thoracic spine. IMPRESSION: COPD. There are no signs of pulmonary edema or focal pulmonary consolidation. There is faint 1 cm radiopacity in the left lower lung field seen only in the PA view. This may suggest an artifact or pleural plaque or parenchymal nodule such as neoplasm. Follow-up chest radiographs in 3 months along with CT if warranted should be considered. Electronically Signed   By: Elmer Picker M.D.   On: 06/26/2022 19:00    Procedures Procedures    Medications Ordered in ED Medications - No data to display  ED Course/ Medical Decision Making/ A&P                           Medical Decision Making Amount and/or Complexity of Data Reviewed Labs: ordered. Radiology: ordered.  Risk Prescription drug management.  Work-up here patient's troponins x2 are both 8.  Patient's potassium a little low at 3.1 we will have patient take some supplemental potassium for the next 2 days renal functions normal with GFR greater than 60.  CBC was normal chest x-ray raise concerns for a pulmonary nodule that could be a neoplastic process.  They are only recommending repeat chest x-ray in 3 months or CT.   Patient go to primary care doctor for that.  EKG shows right bundle branch block which she has had in the past.  Patient not followed by cardiology and sounds as if she gets pain at times.  Had an admission back in April for pain we will refer her to cardiology for consideration for additional work-up like an echocardiogram or nonexercise stress test although some of that may have been done during that admission that was several months ago.  Final Clinical Impression(s) / ED Diagnoses Final diagnoses:  Atypical chest pain  Hypokalemia  Pulmonary nodule    Rx / DC Orders ED Discharge Orders          Ordered    ondansetron (ZOFRAN-ODT) 4 MG disintegrating tablet  Every 8 hours PRN        06/26/22 2306    potassium chloride SA (KLOR-CON M) 20 MEQ tablet  2 times daily        06/26/22 2306              Fredia Sorrow, MD 06/26/22 2311

## 2022-06-26 NOTE — Discharge Instructions (Addendum)
Take the potassium supplement as directed.  Take the Zofran ODT as needed for nausea.  Make an appointment to follow-up with your doctor to have your thyroid-stimulating hormone followed up and he will need follow-up for the pulmonary nodule with a repeat chest x-ray or CT of the chest in 3 months.  Also make an appointment to follow-up with cardiology for further evaluation of the chest pain that is been ongoing at times now over the last 3 months.  Today's work-up for the chest pain without any acute findings.

## 2022-06-27 LAB — TSH: TSH: 75.358 u[IU]/mL — ABNORMAL HIGH (ref 0.350–4.500)

## 2022-07-08 DIAGNOSIS — E46 Unspecified protein-calorie malnutrition: Secondary | ICD-10-CM | POA: Diagnosis not present

## 2022-07-08 DIAGNOSIS — E039 Hypothyroidism, unspecified: Secondary | ICD-10-CM | POA: Diagnosis not present

## 2022-07-08 DIAGNOSIS — N390 Urinary tract infection, site not specified: Secondary | ICD-10-CM | POA: Diagnosis not present

## 2022-07-08 DIAGNOSIS — M2550 Pain in unspecified joint: Secondary | ICD-10-CM | POA: Diagnosis not present

## 2022-07-08 DIAGNOSIS — R300 Dysuria: Secondary | ICD-10-CM | POA: Diagnosis not present

## 2022-07-08 DIAGNOSIS — R110 Nausea: Secondary | ICD-10-CM | POA: Diagnosis not present

## 2022-07-08 DIAGNOSIS — R296 Repeated falls: Secondary | ICD-10-CM | POA: Diagnosis not present

## 2022-07-08 DIAGNOSIS — Z681 Body mass index (BMI) 19 or less, adult: Secondary | ICD-10-CM | POA: Diagnosis not present

## 2022-07-08 DIAGNOSIS — K219 Gastro-esophageal reflux disease without esophagitis: Secondary | ICD-10-CM | POA: Diagnosis not present

## 2022-07-08 DIAGNOSIS — R030 Elevated blood-pressure reading, without diagnosis of hypertension: Secondary | ICD-10-CM | POA: Diagnosis not present

## 2022-07-08 DIAGNOSIS — R5383 Other fatigue: Secondary | ICD-10-CM | POA: Diagnosis not present

## 2022-07-11 ENCOUNTER — Ambulatory Visit (INDEPENDENT_AMBULATORY_CARE_PROVIDER_SITE_OTHER): Payer: Medicare HMO | Admitting: Gastroenterology

## 2022-07-11 ENCOUNTER — Encounter (INDEPENDENT_AMBULATORY_CARE_PROVIDER_SITE_OTHER): Payer: Self-pay | Admitting: Gastroenterology

## 2022-07-11 VITALS — BP 114/64 | HR 87 | Temp 98.0°F | Ht 62.0 in | Wt 103.6 lb

## 2022-07-11 DIAGNOSIS — R1030 Lower abdominal pain, unspecified: Secondary | ICD-10-CM | POA: Diagnosis not present

## 2022-07-11 DIAGNOSIS — R110 Nausea: Secondary | ICD-10-CM | POA: Diagnosis not present

## 2022-07-11 DIAGNOSIS — K2270 Barrett's esophagus without dysplasia: Secondary | ICD-10-CM

## 2022-07-11 DIAGNOSIS — R079 Chest pain, unspecified: Secondary | ICD-10-CM

## 2022-07-11 DIAGNOSIS — R1319 Other dysphagia: Secondary | ICD-10-CM

## 2022-07-11 MED ORDER — ONDANSETRON 4 MG PO TBDP: 4.0000 mg | ORAL_TABLET | Freq: Three times a day (TID) | ORAL | 2 refills | Status: DC | PRN

## 2022-07-11 MED ORDER — DICYCLOMINE HCL 10 MG PO CAPS: 10.0000 mg | ORAL_CAPSULE | Freq: Two times a day (BID) | ORAL | 2 refills | Status: DC | PRN

## 2022-07-11 NOTE — Progress Notes (Signed)
Terry Allen, M.D. Gastroenterology & Hepatology [facility] [address] [place], [place] [address]  Primary Care Physician: Curlene Labrum, MD [place] [address]  I will communicate my assessment and recommendations to the referring MD via EMR.  Problems: Recurrent dysphagia  History of Present Illness: Terry Allen is a 81 y.o. female with past medical history of GERD complicated by Barrett's esophagus and esophageal web, hyperlipidemia, hypothyroidism, osteopenia, recurrent dysphagia, who presents for follow up of chest pain  The patient was last seen on 01/08/2022. At that time, the patient was ordered to undergo a barium esophagram.  This imaging was performed on 01/16/2022 which showed presence of mucosal web at the cervical esophagus obstructing the passage of the 12.5 mm tablet, as well as presence of aspiration of contrast.  Due to this, patient was scheduled for an EGD, which was performed on 02/14/2022.  She was dilated with a Maloney up to 70 and 20 [place].  I repeat an EGD on 02/27/2022 with dilation of her web up to 16 mm.  Patient reports that for the last 3 years or so, she has presented pain in the middle of her chest, which is present almost all day. Pain does not wake her up in the middle of the day. She also reports having some lower abdominal pain, most of the day, which started at the same time her chest pain start. She takes Tylenol 2 tablets and Pepto bismol, which helps relieving the pain. She believes that 3 months ago she started presenting recurrent episodes of nausea without frequent vomiting, but only vomited 2 times. She has felt also decreased appetite  It is unclear if she was having the nausea prior to a copperhead snake bite in July.  Dysphagia has been better since her last EGD, occasionally has some dysphagia with pills but no problems swallowing food.  States that a month ago she had some  episodes of black bowel movements but these have resolved on its own.  The patient denies having any nausea, vomiting, fever, chills, hematochezia, melena, hematemesis, abdominal distention, abdominal pain, diarrhea, jaundice, pruritus. She was presenting significant weight loss as she did not have appetite, but now her appetite has improved and has recently gained 4 lb.  Notably in early October she was diagnosed with a Klebsiella UTI.  Last EGD: as above Last Colonoscopy: 11/03/2019 - One small polyp in the cecum. Biopsied. - Diverticulosis in the sigmoid colon.  Path: A. COLON, CECAL, POLYPECTOMY:  - Sessile serrated polyp/adenoma (s) without cytologic dysplasia.  - No evidence of carcinoma.   Recommended repeat colonoscopy in 5 years if medically fit.  Past Medical History: Past Medical History:  Diagnosis Date   Acid reflux    Patient reports Barretts   Bulging lumbar disc    High cholesterol    Hypothyroidism    Nerve damage    Osteopenia    Pelvic pain in female    due to bladder sling    Past Surgical History: Past Surgical History:  Procedure Laterality Date   APPENDECTOMY  1960   BIOPSY  11/03/2019   Procedure: BIOPSY;  Surgeon: Rogene Houston, MD;  [facility];  Service: Endoscopy;;   BLADDER SUSPENSION     CATARACT EXTRACTION W/PHACO Right 06/07/2019   Procedure: CATARACT EXTRACTION PHACO AND INTRAOCULAR LENS PLACEMENT ([place]);  Surgeon: Baruch Goldmann, MD;  Location: [facility];  Service: Ophthalmology;  Laterality: Right;  CDE: 10.87  CATARACT EXTRACTION W/PHACO Left 06/21/2019   Procedure: CATARACT EXTRACTION PHACO AND INTRAOCULAR LENS PLACEMENT ([facility]);  Surgeon: Baruch Goldmann, MD;  Location: [facility];  Service: Ophthalmology;  Laterality: Left;  CDE: 11.59   COLONOSCOPY N/A 11/03/2019   Procedure: COLONOSCOPY;  Surgeon: Rogene Houston, MD;  [facility];  Service: Endoscopy;  Laterality: N/A;  1245   ESOPHAGEAL DILATION N/A 05/27/2018    Procedure: ESOPHAGEAL DILATION;  Surgeon: Rogene Houston, MD;  [facility];  Service: Endoscopy;  Laterality: N/A;   ESOPHAGEAL DILATION N/A 02/12/2019   Procedure: ESOPHAGEAL DILATION;  Surgeon: Rogene Houston, MD;  [facility];  Service: Endoscopy;  Laterality: N/A;   ESOPHAGEAL DILATION N/A 02/14/2022   Procedure: ESOPHAGEAL DILATION;  Surgeon: Rogene Houston, MD;  [facility];  Service: Endoscopy;  Laterality: N/A;   ESOPHAGEAL DILATION N/A 02/27/2022   Procedure: ESOPHAGEAL DILATION;  Surgeon: Harvel Quale, MD;  [facility];  Service: Gastroenterology;  Laterality: N/A;   ESOPHAGOGASTRODUODENOSCOPY N/A 12/21/2014   Procedure: ESOPHAGOGASTRODUODENOSCOPY (EGD);  Surgeon: Rogene Houston, MD;  [facility];  Service: Endoscopy;  Laterality: N/A;  210   ESOPHAGOGASTRODUODENOSCOPY N/A 06/16/2015   Procedure: ESOPHAGOGASTRODUODENOSCOPY (EGD);  Surgeon: Rogene Houston, MD;  [facility];  Service: Endoscopy;  Laterality: N/A;  1250   ESOPHAGOGASTRODUODENOSCOPY N/A 05/27/2018   Procedure: ESOPHAGOGASTRODUODENOSCOPY (EGD);  Surgeon: Rogene Houston, MD;  [facility];  Service: Endoscopy;  Laterality: N/A;  7:30   ESOPHAGOGASTRODUODENOSCOPY N/A 02/12/2019   Procedure: ESOPHAGOGASTRODUODENOSCOPY (EGD);  Surgeon: Rogene Houston, MD;  [facility];  Service: Endoscopy;  Laterality: N/A;   ESOPHAGOGASTRODUODENOSCOPY (EGD) WITH PROPOFOL N/A 02/14/2022   Procedure: ESOPHAGOGASTRODUODENOSCOPY (EGD) WITH PROPOFOL;  Surgeon: Rogene Houston, MD;  [facility];  Service: Endoscopy;  Laterality: N/A;  920   ESOPHAGOGASTRODUODENOSCOPY (EGD) WITH PROPOFOL N/A 02/27/2022   Procedure: ESOPHAGOGASTRODUODENOSCOPY (EGD) WITH PROPOFOL;  Surgeon: Harvel Quale, MD;  [facility];  Service: Gastroenterology;  Laterality: N/A;  1:15 Per Dr Jenetta Downer asa 1    Family History: Family  History  Problem Relation Age of Onset   Breast cancer Mother    Heart disease Mother    Diabetes Mother    Cancer Mother        breast   Stroke Mother    Hypertension Mother    Lung cancer Father    Stroke Father    Cancer Father        oat cell cancer/lung   Kidney cancer Brother    Cancer Brother        renal cell cancer   Healthy Brother    Diabetes Son     Social History: Social History   Tobacco Use  Smoking Status Never   Passive exposure: Never  Smokeless Tobacco Never   Social History   Substance and Sexual Activity  Alcohol Use No   Alcohol/week: 0.0 standard drinks of alcohol   Social History   Substance and Sexual Activity  Drug Use No    Allergies: No Known Allergies  Medications: Current Outpatient Medications  Medication Sig Dispense Refill   acetaminophen (TYLENOL) 325 MG tablet Take 2 tablets (650 mg total) by mouth every 6 (six) hours as needed for mild pain (or Fever >/= 101).     albuterol (VENTOLIN HFA) 108 (90 Base) MCG/ACT inhaler Inhale 2 puffs into the lungs every 4 (four) hours as needed for  wheezing or shortness of breath. 1 each 3   amitriptyline (ELAVIL) 50 MG tablet Take 50 mg by mouth at bedtime.   2   atorvastatin (LIPITOR) 40 MG tablet Take 40 mg by mouth at bedtime.      Calcium Carb-Cholecalciferol (CALCIUM 600 + D PO) Take 1 tablet by mouth 2 (two) times a day.     famotidine (PEPCID) 20 MG tablet Take 20 mg by mouth 2 (two) times daily.     gabapentin (NEURONTIN) 300 MG capsule Take 1 capsule (300 mg total) by mouth 3 (three) times daily.     levothyroxine (SYNTHROID) 50 MCG tablet Take 50 mcg by mouth daily before breakfast.     ondansetron (ZOFRAN-ODT) 4 MG disintegrating tablet Take 1 tablet (4 mg total) by mouth every 8 (eight) hours as needed for nausea or vomiting. 12 tablet 1   potassium chloride SA (KLOR-CON M) 20 MEQ tablet Take 1 tablet (20 mEq total) by mouth 2 (two) times daily. 4 tablet 0   No current  facility-administered medications for this visit.    Review of Systems: GENERAL: negative for malaise, night sweats HEENT: No changes in hearing or vision, no nose bleeds or other nasal problems. NECK: Negative for lumps, goiter, pain and significant neck swelling RESPIRATORY: Negative for cough, wheezing CARDIOVASCULAR: Negative for chest pain, leg swelling, palpitations, orthopnea GI: SEE HPI MUSCULOSKELETAL: Negative for joint pain or swelling, back pain, and muscle pain. SKIN: Negative for lesions, rash PSYCH: Negative for sleep disturbance, mood disorder and recent psychosocial stressors. HEMATOLOGY Negative for prolonged bleeding, bruising easily, and swollen nodes. ENDOCRINE: Negative for cold or heat intolerance, polyuria, polydipsia and goiter. NEURO: negative for tremor, gait imbalance, syncope and seizures. The remainder of the review of systems is noncontributory.   Physical Exam: BP 114/64 (BP Location: Left Arm, Patient Position: Sitting, Cuff Size: Small)   Pulse 87   Temp 98 F (36.7 C) (Oral)   Ht '5\' 2"'$  (1.575 m)   Wt 103 lb 9.6 oz (47 kg)   BMI 18.95 kg/m  GENERAL: The patient is AO x3, in no acute distress. HEENT: Head is normocephalic and atraumatic. EOMI are intact. Mouth is well hydrated and without lesions. NECK: Supple. No masses LUNGS: Clear to auscultation. No presence of rhonchi/wheezing/rales. Adequate chest expansion HEART: RRR, normal s1 and s2. ABDOMEN: tender to palpation in the lower abdomen in mid line, no guarding, no peritoneal signs, and nondistended. BS +. No masses. EXTREMITIES: Without any cyanosis, clubbing, rash, lesions or edema. NEUROLOGIC: AOx3, no focal motor deficit. SKIN: no jaundice, no rashes  Imaging/Labs: as above  I personally reviewed and interpreted the available labs, imaging and endoscopic files.  Impression and Plan: SHAIMA SARDINAS is a 81 y.o. female coming for follow up of ***   All questions were  answered.      Terry Peppers, MD Gastroenterology and Hepatology [facility]

## 2022-07-11 NOTE — Patient Instructions (Addendum)
Schedule CT abdomen/pelvis with IV contrast Start Bentyl 1 tablet q12h as needed for abdominal pain Continue Zofran needed for nausea Cardiology referral - chronic chest pain Discuss with PCP evaluation of recurrent UTI and urology referral

## 2022-07-15 ENCOUNTER — Telehealth (INDEPENDENT_AMBULATORY_CARE_PROVIDER_SITE_OTHER): Payer: Self-pay | Admitting: *Deleted

## 2022-07-15 NOTE — Telephone Encounter (Signed)
PA approved via [facility] for CT A/P. Auth# [id number], dos: 07/11/22-08/10/2022.  Called pt and spoke with son and is aware of ct appt details. He voiced understanding and had no questions

## 2022-07-16 ENCOUNTER — Ambulatory Visit: Payer: Medicare HMO | Attending: Cardiology | Admitting: Cardiology

## 2022-07-16 ENCOUNTER — Encounter: Payer: Self-pay | Admitting: Cardiology

## 2022-07-16 VITALS — BP 120/60 | HR 76 | Ht 62.0 in | Wt 99.0 lb

## 2022-07-16 DIAGNOSIS — R0789 Other chest pain: Secondary | ICD-10-CM | POA: Diagnosis not present

## 2022-07-16 NOTE — Patient Instructions (Signed)
Medication Instructions:  Your physician recommends that you continue on your current medications as directed. Please refer to the Current Medication list given to you today.   Labwork: none  Testing/Procedures: none  Follow-Up:  Your physician recommends that you schedule a follow-up appointment in: Follow Up as needed  Any Other Special Instructions Will Be Listed Below (If Applicable).  If you need a refill on your cardiac medications before your next appointment, please call your pharmacy.

## 2022-07-16 NOTE — Progress Notes (Signed)
Clinical Summary Terry Allen is a 81 y.o.female seen as new consult, referred by Dr Jenetta Downer for the following medical problems.  1.Chest pain - Her chest pain has been previously evaluated with upper endoscopy and relatively recent CT angio of the chest on 12/21/2021, which were unremarkable.   12/2021 nuclear stress: no ischemia 12/2021 echo: LVEF 60-65%, no WMAs, grade I dd 12/2021 CT PE: no PE 02/2022 EGD: esophageal web, no other significant findings   - some ongoing pains - center chest, pressure like pain. Tender to palpation. 6/10 in severity. No other associated symptoms. Can occur at rest or with activity. Constant pain can be there all day long. Worst with deep breathing and movement.  - can improve with pepto and tylenol.      2.GERD Past Medical History:  Diagnosis Date   Acid reflux    Patient reports Barretts   Bulging lumbar disc    High cholesterol    Hypothyroidism    Nerve damage    Osteopenia    Pelvic pain in female    due to bladder sling     No Known Allergies   Current Outpatient Medications  Medication Sig Dispense Refill   acetaminophen (TYLENOL) 325 MG tablet Take 2 tablets (650 mg total) by mouth every 6 (six) hours as needed for mild pain (or Fever >/= 101).     albuterol (VENTOLIN HFA) 108 (90 Base) MCG/ACT inhaler Inhale 2 puffs into the lungs every 4 (four) hours as needed for wheezing or shortness of breath. 1 each 3   amitriptyline (ELAVIL) 50 MG tablet Take 50 mg by mouth at bedtime.   2   atorvastatin (LIPITOR) 40 MG tablet Take 40 mg by mouth at bedtime.      Calcium Carb-Cholecalciferol (CALCIUM 600 + D PO) Take 1 tablet by mouth 2 (two) times a day.     dicyclomine (BENTYL) 10 MG capsule Take 1 capsule (10 mg total) by mouth every 12 (twelve) hours as needed for spasms (abdominal pain). 60 capsule 2   famotidine (PEPCID) 20 MG tablet Take 20 mg by mouth 2 (two) times daily.     gabapentin (NEURONTIN) 300 MG capsule Take 1  capsule (300 mg total) by mouth 3 (three) times daily.     levothyroxine (SYNTHROID) 50 MCG tablet Take 50 mcg by mouth daily before breakfast.     ondansetron (ZOFRAN-ODT) 4 MG disintegrating tablet Take 1 tablet (4 mg total) by mouth every 8 (eight) hours as needed for nausea or vomiting. 60 tablet 2   potassium chloride SA (KLOR-CON M) 20 MEQ tablet Take 1 tablet (20 mEq total) by mouth 2 (two) times daily. 4 tablet 0   No current facility-administered medications for this visit.     Past Surgical History:  Procedure Laterality Date   APPENDECTOMY  1960   BIOPSY  11/03/2019   Procedure: BIOPSY;  Surgeon: Rogene Houston, MD;  [facility];  Service: Endoscopy;;   BLADDER SUSPENSION     CATARACT EXTRACTION W/PHACO Right 06/07/2019   Procedure: CATARACT EXTRACTION PHACO AND INTRAOCULAR LENS PLACEMENT ([place]);  Surgeon: Baruch Goldmann, MD;  [facility]: [facility];  Service: Ophthalmology;  Laterality: Right;  CDE: 10.87   CATARACT EXTRACTION W/PHACO Left 06/21/2019   Procedure: CATARACT EXTRACTION PHACO AND INTRAOCULAR LENS PLACEMENT ([facility]);  Surgeon: Baruch Goldmann, MD;  [facility];  Service: Ophthalmology;  Laterality: Left;  CDE: 11.59   COLONOSCOPY N/A 11/03/2019   Procedure: COLONOSCOPY;  Surgeon:  Rogene Houston, MD;  [facility];  Service: Endoscopy;  Laterality: N/A;  1245   ESOPHAGEAL DILATION N/A 05/27/2018   Procedure: ESOPHAGEAL DILATION;  Surgeon: Rogene Houston, MD;  [facility];  Service: Endoscopy;  Laterality: N/A;   ESOPHAGEAL DILATION N/A 02/12/2019   Procedure: ESOPHAGEAL DILATION;  Surgeon: Rogene Houston, MD;  [facility];  Service: Endoscopy;  Laterality: N/A;   ESOPHAGEAL DILATION N/A 02/14/2022   Procedure: ESOPHAGEAL DILATION;  Surgeon: Rogene Houston, MD;  [facility];  Service: Endoscopy;  Laterality: N/A;   ESOPHAGEAL DILATION N/A 02/27/2022   Procedure: ESOPHAGEAL DILATION;  Surgeon: Harvel Quale, MD;  [facility];  Service: Gastroenterology;  Laterality: N/A;   ESOPHAGOGASTRODUODENOSCOPY N/A 12/21/2014   Procedure: ESOPHAGOGASTRODUODENOSCOPY (EGD);  Surgeon: Rogene Houston, MD;  [facility];  Service: Endoscopy;  Laterality: N/A;  210   ESOPHAGOGASTRODUODENOSCOPY N/A 06/16/2015   Procedure: ESOPHAGOGASTRODUODENOSCOPY (EGD);  Surgeon: Rogene Houston, MD;  [facility];  Service: Endoscopy;  Laterality: N/A;  1250   ESOPHAGOGASTRODUODENOSCOPY N/A 05/27/2018   Procedure: ESOPHAGOGASTRODUODENOSCOPY (EGD);  Surgeon: Rogene Houston, MD;  [facility];  Service: Endoscopy;  Laterality: N/A;  7:30   ESOPHAGOGASTRODUODENOSCOPY N/A 02/12/2019   Procedure: ESOPHAGOGASTRODUODENOSCOPY (EGD);  Surgeon: Rogene Houston, MD;  [facility];  Service: Endoscopy;  Laterality: N/A;   ESOPHAGOGASTRODUODENOSCOPY (EGD) WITH PROPOFOL N/A 02/14/2022   Procedure: ESOPHAGOGASTRODUODENOSCOPY (EGD) WITH PROPOFOL;  Surgeon: Rogene Houston, MD;  [facility];  Service: Endoscopy;  Laterality: N/A;  920   ESOPHAGOGASTRODUODENOSCOPY (EGD) WITH PROPOFOL N/A 02/27/2022   Procedure: ESOPHAGOGASTRODUODENOSCOPY (EGD) WITH PROPOFOL;  Surgeon: Harvel Quale, MD;  [facility];  Service: Gastroenterology;  Laterality: N/A;  1:15 Per Dr Jenetta Downer asa 1     No Known Allergies    Family History  Problem Relation Age of Onset   Breast cancer Mother    Heart disease Mother    Diabetes Mother    Cancer Mother        breast   Stroke Mother    Hypertension Mother    Lung cancer Father    Stroke Father    Cancer Father        oat cell cancer/lung   Kidney cancer Brother    Cancer Brother        renal cell cancer   Healthy Brother    Diabetes Son      Social History Terry Allen reports that she has never smoked. She has never been exposed to tobacco smoke. She has never used smokeless tobacco. Terry Allen reports no  history of alcohol use.   Review of Systems CONSTITUTIONAL: No weight loss, fever, chills, weakness or fatigue.  HEENT: Eyes: No visual loss, blurred vision, double vision or yellow sclerae.No hearing loss, sneezing, congestion, runny nose or sore throat.  SKIN: No rash or itching.  CARDIOVASCULAR: per hpi RESPIRATORY: No shortness of breath, cough or sputum.  GASTROINTESTINAL: No anorexia, nausea, vomiting or diarrhea. No abdominal pain or blood.  GENITOURINARY: No burning on urination, no polyuria NEUROLOGICAL: No headache, dizziness, syncope, paralysis, ataxia, numbness or tingling in the extremities. No change in bowel or bladder control.  MUSCULOSKELETAL: No muscle, back pain, joint pain or stiffness.  LYMPHATICS: No enlarged nodes. No history of splenectomy.  PSYCHIATRIC: No history of depression or anxiety.  ENDOCRINOLOGIC: No reports of sweating, cold or heat intolerance. No polyuria  or polydipsia.  Marland Kitchen   Physical Examination Today's Vitals   07/16/22 0847  BP: 120/60  Pulse: 76  Weight: 99 lb (44.9 kg)  Height: '5\' 2"'$  (1.575 m)   Body mass index is 18.11 kg/m.  Gen: resting comfortably, no acute distress HEENT: no scleral icterus, pupils equal round and reactive, no palptable cervical adenopathy,  CV: RRR, no m/r/g no jvd Resp: Clear to auscultation bilaterally GI: abdomen is soft, non-tender, non-distended, normal bowel sounds, no hepatosplenomegaly MSK: extremities are warm, no edema.  Skin: warm, no rash Neuro:  no focal deficits Psych: appropriate affect     Assessment and Plan   1.Chest pain - noncardiac in description. Can last hours to a full day at a time, worst with position and deep breathing, tender to palpation. Extensive testing in 12/2021 with echo and nuclear stress test were benign - no further cardiac testing is planned at this time    F/u just as needed   Arnoldo Lenis, M.D.

## 2022-07-31 DIAGNOSIS — M2550 Pain in unspecified joint: Secondary | ICD-10-CM | POA: Diagnosis not present

## 2022-07-31 DIAGNOSIS — R5383 Other fatigue: Secondary | ICD-10-CM | POA: Diagnosis not present

## 2022-07-31 DIAGNOSIS — R110 Nausea: Secondary | ICD-10-CM | POA: Diagnosis not present

## 2022-07-31 DIAGNOSIS — E039 Hypothyroidism, unspecified: Secondary | ICD-10-CM | POA: Diagnosis not present

## 2022-07-31 DIAGNOSIS — K219 Gastro-esophageal reflux disease without esophagitis: Secondary | ICD-10-CM | POA: Diagnosis not present

## 2022-07-31 DIAGNOSIS — N390 Urinary tract infection, site not specified: Secondary | ICD-10-CM | POA: Diagnosis not present

## 2022-07-31 DIAGNOSIS — R296 Repeated falls: Secondary | ICD-10-CM | POA: Diagnosis not present

## 2022-07-31 DIAGNOSIS — Z681 Body mass index (BMI) 19 or less, adult: Secondary | ICD-10-CM | POA: Diagnosis not present

## 2022-07-31 DIAGNOSIS — R030 Elevated blood-pressure reading, without diagnosis of hypertension: Secondary | ICD-10-CM | POA: Diagnosis not present

## 2022-08-07 ENCOUNTER — Ambulatory Visit (HOSPITAL_COMMUNITY): Admission: RE | Admit: 2022-08-07 | Payer: Medicare HMO | Source: Ambulatory Visit

## 2022-10-29 DIAGNOSIS — E039 Hypothyroidism, unspecified: Secondary | ICD-10-CM | POA: Diagnosis not present

## 2022-10-29 DIAGNOSIS — K219 Gastro-esophageal reflux disease without esophagitis: Secondary | ICD-10-CM | POA: Diagnosis not present

## 2022-11-05 DIAGNOSIS — R32 Unspecified urinary incontinence: Secondary | ICD-10-CM | POA: Diagnosis not present

## 2022-11-05 DIAGNOSIS — E876 Hypokalemia: Secondary | ICD-10-CM | POA: Diagnosis not present

## 2022-11-05 DIAGNOSIS — E039 Hypothyroidism, unspecified: Secondary | ICD-10-CM | POA: Diagnosis not present

## 2022-11-05 DIAGNOSIS — R030 Elevated blood-pressure reading, without diagnosis of hypertension: Secondary | ICD-10-CM | POA: Diagnosis not present

## 2022-11-05 DIAGNOSIS — R110 Nausea: Secondary | ICD-10-CM | POA: Diagnosis not present

## 2022-11-05 DIAGNOSIS — R159 Full incontinence of feces: Secondary | ICD-10-CM | POA: Diagnosis not present

## 2022-11-05 DIAGNOSIS — N390 Urinary tract infection, site not specified: Secondary | ICD-10-CM | POA: Diagnosis not present

## 2022-11-05 DIAGNOSIS — Z681 Body mass index (BMI) 19 or less, adult: Secondary | ICD-10-CM | POA: Diagnosis not present

## 2022-11-05 DIAGNOSIS — R319 Hematuria, unspecified: Secondary | ICD-10-CM | POA: Diagnosis not present

## 2022-11-05 DIAGNOSIS — E46 Unspecified protein-calorie malnutrition: Secondary | ICD-10-CM | POA: Diagnosis not present

## 2022-11-07 ENCOUNTER — Other Ambulatory Visit (HOSPITAL_COMMUNITY): Payer: Self-pay | Admitting: Family Medicine

## 2022-11-07 DIAGNOSIS — R110 Nausea: Secondary | ICD-10-CM

## 2022-11-07 DIAGNOSIS — R319 Hematuria, unspecified: Secondary | ICD-10-CM

## 2022-11-07 DIAGNOSIS — R1030 Lower abdominal pain, unspecified: Secondary | ICD-10-CM

## 2022-11-11 ENCOUNTER — Encounter (INDEPENDENT_AMBULATORY_CARE_PROVIDER_SITE_OTHER): Payer: Self-pay | Admitting: Gastroenterology

## 2022-11-11 ENCOUNTER — Ambulatory Visit (INDEPENDENT_AMBULATORY_CARE_PROVIDER_SITE_OTHER): Payer: Medicare HMO | Admitting: Gastroenterology

## 2022-12-11 ENCOUNTER — Encounter: Payer: Self-pay | Admitting: Family Medicine

## 2022-12-11 ENCOUNTER — Ambulatory Visit (INDEPENDENT_AMBULATORY_CARE_PROVIDER_SITE_OTHER): Payer: Medicare HMO | Admitting: Family Medicine

## 2022-12-11 VITALS — BP 124/67 | HR 88 | Ht 62.0 in | Wt 103.0 lb

## 2022-12-11 DIAGNOSIS — Z131 Encounter for screening for diabetes mellitus: Secondary | ICD-10-CM

## 2022-12-11 DIAGNOSIS — Z13220 Encounter for screening for lipoid disorders: Secondary | ICD-10-CM

## 2022-12-11 DIAGNOSIS — Z1329 Encounter for screening for other suspected endocrine disorder: Secondary | ICD-10-CM

## 2022-12-11 DIAGNOSIS — I10 Essential (primary) hypertension: Secondary | ICD-10-CM | POA: Diagnosis not present

## 2022-12-11 DIAGNOSIS — N390 Urinary tract infection, site not specified: Secondary | ICD-10-CM | POA: Insufficient documentation

## 2022-12-11 LAB — POCT URINALYSIS DIPSTICK
Bilirubin, UA: NEGATIVE
Blood, UA: POSITIVE
Glucose, UA: NEGATIVE
Ketones, UA: NEGATIVE
Nitrite, UA: NEGATIVE
Protein, UA: POSITIVE — AB
Spec Grav, UA: 1.015 (ref 1.010–1.025)
Urobilinogen, UA: 0.2 E.U./dL
pH, UA: 6 (ref 5.0–8.0)

## 2022-12-11 MED ORDER — SULFAMETHOXAZOLE-TRIMETHOPRIM 800-160 MG PO TABS
1.0000 | ORAL_TABLET | Freq: Two times a day (BID) | ORAL | 0 refills | Status: AC
Start: 2022-12-11 — End: 2022-12-16

## 2022-12-11 MED ORDER — PHENAZOPYRIDINE HCL 95 MG PO TABS
95.0000 mg | ORAL_TABLET | Freq: Three times a day (TID) | ORAL | 0 refills | Status: DC | PRN
Start: 2022-12-11 — End: 2023-01-30

## 2022-12-11 NOTE — Progress Notes (Signed)
New Patient Office Visit   Subjective   Patient ID: Terry Allen, female    DOB: 02-Dec-1940  Age: 82 y.o. MRN: [id number]  CC:  Chief Complaint  Patient presents with   Establish Care   Dysuria    Patient complains of dysuria, strong odor starting 3 weeks ago.     HPI Terry Allen 82 year old female, presents to establish care. She  has a past medical history of Acid reflux, Bulging lumbar disc, High cholesterol, Hypothyroidism, Nerve damage, Osteopenia, and Pelvic pain in female.  Urinary Tract Infection  This is a chronic problem. The problem occurs every urination. The problem has been gradually worsening. The quality of the pain is described as aching and burning. The pain is at a severity of 8/10. The maximum temperature recorded prior to her arrival was 100 - 100.9 F. The fever has been present for 1 - 2 days. She is Not sexually active. There is No history of pyelonephritis. Associated symptoms include flank pain, frequency, hesitancy and urgency. Pertinent negatives include no chills, discharge, hematuria, nausea, sweats or vomiting. She has tried increased fluids for the symptoms. The treatment provided no relief. Her past medical history is significant for recurrent UTIs. There is no history of kidney stones.      Outpatient Encounter Medications as of 12/11/2022  Medication Sig   acetaminophen (TYLENOL) 325 MG tablet Take 2 tablets (650 mg total) by mouth every 6 (six) hours as needed for mild pain (or Fever >/= 101).   albuterol (VENTOLIN HFA) 108 (90 Base) MCG/ACT inhaler Inhale 2 puffs into the lungs every 4 (four) hours as needed for wheezing or shortness of breath.   amitriptyline (ELAVIL) 50 MG tablet Take 50 mg by mouth at bedtime.    atorvastatin (LIPITOR) 40 MG tablet Take 40 mg by mouth at bedtime.    Calcium Carb-Cholecalciferol (CALCIUM 600 + D PO) Take 1 tablet by mouth 2 (two) times a day.   dicyclomine (BENTYL) 10 MG capsule Take 1 capsule (10  mg total) by mouth every 12 (twelve) hours as needed for spasms (abdominal pain).   famotidine (PEPCID) 20 MG tablet Take 20 mg by mouth 2 (two) times daily.   gabapentin (NEURONTIN) 300 MG capsule Take 1 capsule (300 mg total) by mouth 3 (three) times daily.   levothyroxine (SYNTHROID) 50 MCG tablet Take 50 mcg by mouth every other day.   levothyroxine (SYNTHROID) 75 MCG tablet Take 75 mcg by mouth every 3 (three) days. Alternating with 50 mcg.   ondansetron (ZOFRAN-ODT) 4 MG disintegrating tablet Take 1 tablet (4 mg total) by mouth every 8 (eight) hours as needed for nausea or vomiting.   potassium chloride SA (KLOR-CON M) 20 MEQ tablet Take 1 tablet (20 mEq total) by mouth 2 (two) times daily.   sulfamethoxazole-trimethoprim (BACTRIM DS) 800-160 MG tablet Take 1 tablet by mouth 2 (two) times daily for 5 days.   No facility-administered encounter medications on file as of 12/11/2022.    Past Surgical History:  Procedure Laterality Date   APPENDECTOMY  1960   BIOPSY  11/03/2019   Procedure: BIOPSY;  Surgeon: Rogene Houston, MD;  [facility];  Service: Endoscopy;;   BLADDER SUSPENSION     CATARACT EXTRACTION W/PHACO Right 06/07/2019   Procedure: CATARACT EXTRACTION PHACO AND INTRAOCULAR LENS PLACEMENT ([place]);  Surgeon: Baruch Goldmann, MD;  Location: [facility];  Service: Ophthalmology;  Laterality: Right;  CDE: 10.87   CATARACT EXTRACTION W/PHACO Left 06/21/2019  Procedure: CATARACT EXTRACTION PHACO AND INTRAOCULAR LENS PLACEMENT (IOC);  Surgeon: Baruch Goldmann, MD;  Location: [facility];  Service: Ophthalmology;  Laterality: Left;  CDE: 11.59   COLONOSCOPY N/A 11/03/2019   Procedure: COLONOSCOPY;  Surgeon: Rogene Houston, MD;  [facility];  Service: Endoscopy;  Laterality: N/A;  1245   ESOPHAGEAL DILATION N/A 05/27/2018   Procedure: ESOPHAGEAL DILATION;  Surgeon: Rogene Houston, MD;  [facility];  Service: Endoscopy;  Laterality: N/A;   ESOPHAGEAL DILATION N/A  02/12/2019   Procedure: ESOPHAGEAL DILATION;  Surgeon: Rogene Houston, MD;  [facility];  Service: Endoscopy;  Laterality: N/A;   ESOPHAGEAL DILATION N/A 02/14/2022   Procedure: ESOPHAGEAL DILATION;  Surgeon: Rogene Houston, MD;  [facility];  Service: Endoscopy;  Laterality: N/A;   ESOPHAGEAL DILATION N/A 02/27/2022   Procedure: ESOPHAGEAL DILATION;  Surgeon: Harvel Quale, MD;  [facility];  Service: Gastroenterology;  Laterality: N/A;   ESOPHAGOGASTRODUODENOSCOPY N/A 12/21/2014   Procedure: ESOPHAGOGASTRODUODENOSCOPY (EGD);  Surgeon: Rogene Houston, MD;  [facility];  Service: Endoscopy;  Laterality: N/A;  210   ESOPHAGOGASTRODUODENOSCOPY N/A 06/16/2015   Procedure: ESOPHAGOGASTRODUODENOSCOPY (EGD);  Surgeon: Rogene Houston, MD;  [facility];  Service: Endoscopy;  Laterality: N/A;  1250   ESOPHAGOGASTRODUODENOSCOPY N/A 05/27/2018   Procedure: ESOPHAGOGASTRODUODENOSCOPY (EGD);  Surgeon: Rogene Houston, MD;  [facility];  Service: Endoscopy;  Laterality: N/A;  7:30   ESOPHAGOGASTRODUODENOSCOPY N/A 02/12/2019   Procedure: ESOPHAGOGASTRODUODENOSCOPY (EGD);  Surgeon: Rogene Houston, MD;  [facility];  Service: Endoscopy;  Laterality: N/A;   ESOPHAGOGASTRODUODENOSCOPY (EGD) WITH PROPOFOL N/A 02/14/2022   Procedure: ESOPHAGOGASTRODUODENOSCOPY (EGD) WITH PROPOFOL;  Surgeon: Rogene Houston, MD;  [facility];  Service: Endoscopy;  Laterality: N/A;  920   ESOPHAGOGASTRODUODENOSCOPY (EGD) WITH PROPOFOL N/A 02/27/2022   Procedure: ESOPHAGOGASTRODUODENOSCOPY (EGD) WITH PROPOFOL;  Surgeon: Harvel Quale, MD;  [facility];  Service: Gastroenterology;  Laterality: N/A;  1:15 Per Dr Jenetta Downer asa 1    Review of Systems  Constitutional:  Positive for fever. Negative for chills.  Eyes:  Negative for blurred vision.  Respiratory:  Negative for shortness of breath.   Cardiovascular:   Negative for chest pain.  Gastrointestinal:  Negative for abdominal pain, nausea and vomiting.  Genitourinary:  Positive for dysuria, flank pain, frequency, hesitancy and urgency. Negative for hematuria.  Neurological:  Negative for dizziness and headaches.      Objective    BP 124/67   Pulse 88   Ht 5\' 2"  (1.575 m)   Wt 103 lb (46.7 kg)   SpO2 98%   BMI 18.84 kg/m   Physical Exam Constitutional:      Appearance: Normal appearance.  HENT:     Head: Normocephalic.  Cardiovascular:     Rate and Rhythm: Normal rate.     Pulses: Normal pulses.     Heart sounds: Normal heart sounds.  Pulmonary:     Effort: Pulmonary effort is normal.  Abdominal:     General: Abdomen is flat.  Musculoskeletal:        General: Normal range of motion.     Cervical back: Normal range of motion and neck supple.  Skin:    General: Skin is warm.     Capillary Refill: Capillary refill takes less than 2 seconds.  Neurological:     General: No focal deficit present.     Mental Status:  She is alert.  Psychiatric:        Mood and Affect: Mood normal.        Thought Content: Thought content normal.       Assessment & Plan:  Urinary tract infection without hematuria, site unspecified Assessment & Plan: Bactrim 800-160 mg x 5 days Urinalysis,and urine culture ordered- Awaiting results will follow up. May take OTC AZO for urinary pain relief. Explained to increase oral fluid intake. Drink 8 glasses of water daily.  Follow up for worsening or persistent symptoms. Patient verbalizes understanding regarding plan of care and all questions answered.   Orders: -     POCT urinalysis dipstick -     Urine Culture -     Sulfamethoxazole-Trimethoprim; Take 1 tablet by mouth 2 (two) times daily for 5 days.  Dispense: 10 tablet; Refill: 0  Screening for thyroid disorder -     TSH + free T4  Primary hypertension -     CBC with Differential/Platelet -     CMP14+EGFR -     Microalbumin / creatinine urine  ratio  Screening for diabetes mellitus -     Hemoglobin A1c  Screening for lipid disorders -     Lipid panel  Recurrent UTI Assessment & Plan: Referral to Urology for further evaluation   Orders: -     Ambulatory referral to Urology    No follow-ups on file.   Renard Hamper Ria Comment, FNP

## 2022-12-11 NOTE — Assessment & Plan Note (Signed)
Bactrim 800-160 mg x 5 days Urinalysis,and urine culture ordered- Awaiting results will follow up. May take OTC AZO for urinary pain relief. Explained to increase oral fluid intake. Drink 8 glasses of water daily.  Follow up for worsening or persistent symptoms. Patient verbalizes understanding regarding plan of care and all questions answered.

## 2022-12-11 NOTE — Assessment & Plan Note (Signed)
Referral to Urology for further evaluation

## 2022-12-12 DIAGNOSIS — I10 Essential (primary) hypertension: Secondary | ICD-10-CM | POA: Diagnosis not present

## 2022-12-12 DIAGNOSIS — Z131 Encounter for screening for diabetes mellitus: Secondary | ICD-10-CM | POA: Diagnosis not present

## 2022-12-12 DIAGNOSIS — Z1329 Encounter for screening for other suspected endocrine disorder: Secondary | ICD-10-CM | POA: Diagnosis not present

## 2022-12-12 DIAGNOSIS — Z13220 Encounter for screening for lipoid disorders: Secondary | ICD-10-CM | POA: Diagnosis not present

## 2022-12-13 NOTE — Progress Notes (Signed)
Please inform patient Hemoglobin A1C 5.8 indicates prediabetes It is important to follow a DASH diet which includes vegetables,fruits,whole grains, fat free or low fat diary,fish,poultry,beans,nuts and seeds,vegetable oils. Find an activity that you will enjoyandstart to be active at least 5 days a week for 30 minutes each day.

## 2022-12-14 LAB — CMP14+EGFR
ALT: 36 IU/L — ABNORMAL HIGH (ref 0–32)
AST: 39 IU/L (ref 0–40)
Albumin/Globulin Ratio: 1.6 (ref 1.2–2.2)
Albumin: 4.1 g/dL (ref 3.8–4.8)
Alkaline Phosphatase: 75 IU/L (ref 44–121)
BUN/Creatinine Ratio: 8 — ABNORMAL LOW (ref 12–28)
BUN: 7 mg/dL — ABNORMAL LOW (ref 8–27)
Bilirubin Total: 0.6 mg/dL (ref 0.0–1.2)
CO2: 26 mmol/L (ref 20–29)
Calcium: 9.6 mg/dL (ref 8.7–10.3)
Chloride: 101 mmol/L (ref 96–106)
Creatinine, Ser: 0.83 mg/dL (ref 0.57–1.00)
Globulin, Total: 2.6 g/dL (ref 1.5–4.5)
Glucose: 93 mg/dL (ref 70–99)
Potassium: 4.5 mmol/L (ref 3.5–5.2)
Sodium: 142 mmol/L (ref 134–144)
Total Protein: 6.7 g/dL (ref 6.0–8.5)
eGFR: 71 mL/min/{1.73_m2} (ref >59)

## 2022-12-14 LAB — CBC WITH DIFFERENTIAL/PLATELET
Basophils Absolute: 0 10*3/uL (ref 0.0–0.2)
Basos: 1 %
EOS (ABSOLUTE): 0.3 10*3/uL (ref 0.0–0.4)
Eos: 5 %
Hematocrit: 39.1 % (ref 34.0–46.6)
Hemoglobin: 12.9 g/dL (ref 11.1–15.9)
Immature Grans (Abs): 0 10*3/uL (ref 0.0–0.1)
Immature Granulocytes: 0 %
Lymphocytes Absolute: 1.2 10*3/uL (ref 0.7–3.1)
Lymphs: 23 %
MCH: 31.2 pg (ref 26.6–33.0)
MCHC: 33 g/dL (ref 31.5–35.7)
MCV: 94 fL (ref 79–97)
Monocytes Absolute: 0.6 10*3/uL (ref 0.1–0.9)
Monocytes: 12 %
Neutrophils Absolute: 3.1 10*3/uL (ref 1.4–7.0)
Neutrophils: 59 %
Platelets: 268 10*3/uL (ref 150–450)
RBC: 4.14 x10E6/uL (ref 3.77–5.28)
RDW: 12.8 % (ref 11.7–15.4)
WBC: 5.3 10*3/uL (ref 3.4–10.8)

## 2022-12-14 LAB — MICROALBUMIN / CREATININE URINE RATIO
Creatinine, Urine: 144.5 mg/dL
Microalb/Creat Ratio: 253 mg/g creat — ABNORMAL HIGH (ref 0–29)
Microalbumin, Urine: 364.9 ug/mL

## 2022-12-14 LAB — HEMOGLOBIN A1C
Est. average glucose Bld gHb Est-mCnc: 120 mg/dL
Hgb A1c MFr Bld: 5.8 % — ABNORMAL HIGH (ref 4.8–5.6)

## 2022-12-14 LAB — LIPID PANEL
Chol/HDL Ratio: 2.4 ratio (ref 0.0–4.4)
Cholesterol, Total: 151 mg/dL (ref 100–199)
HDL: 62 mg/dL (ref >39)
LDL Chol Calc (NIH): 64 mg/dL (ref 0–99)
Triglycerides: 145 mg/dL (ref 0–149)
VLDL Cholesterol Cal: 25 mg/dL (ref 5–40)

## 2022-12-14 LAB — TSH+FREE T4
Free T4: 1.11 ng/dL (ref 0.82–1.77)
TSH: 1.36 u[IU]/mL (ref 0.450–4.500)

## 2022-12-18 LAB — URINE CULTURE

## 2022-12-31 ENCOUNTER — Ambulatory Visit (HOSPITAL_COMMUNITY)
Admission: RE | Admit: 2022-12-31 | Discharge: 2022-12-31 | Disposition: A | Discharge status: Home / Self Care | Payer: Medicare HMO | Source: Ambulatory Visit | Attending: Family Medicine | Admitting: Family Medicine

## 2022-12-31 ENCOUNTER — Encounter (HOSPITAL_COMMUNITY): Payer: Self-pay | Admitting: Radiology

## 2022-12-31 DIAGNOSIS — R319 Hematuria, unspecified: Secondary | ICD-10-CM | POA: Diagnosis not present

## 2022-12-31 DIAGNOSIS — R109 Unspecified abdominal pain: Secondary | ICD-10-CM | POA: Diagnosis not present

## 2022-12-31 DIAGNOSIS — R110 Nausea: Secondary | ICD-10-CM

## 2022-12-31 MED ORDER — IOHEXOL 300 MG/ML  SOLN
100.0000 mL | Freq: Once | INTRAMUSCULAR | Status: AC | PRN
  Administered 2022-12-31: 100 mL via INTRAVENOUS

## 2023-01-06 ENCOUNTER — Telehealth: Payer: Self-pay | Admitting: Family Medicine

## 2023-01-06 ENCOUNTER — Other Ambulatory Visit: Payer: Self-pay | Admitting: Family Medicine

## 2023-01-06 NOTE — Telephone Encounter (Signed)
Patient came by the office to have her CT scan results go over, no nurse spoke to her, asked nurse to give her a call. [phone].

## 2023-01-07 NOTE — Telephone Encounter (Signed)
She will need to speak with the ordering provider for her results, I did not order

## 2023-01-14 ENCOUNTER — Encounter: Payer: Self-pay | Admitting: Internal Medicine

## 2023-01-14 ENCOUNTER — Ambulatory Visit (INDEPENDENT_AMBULATORY_CARE_PROVIDER_SITE_OTHER): Payer: Medicare HMO | Admitting: Internal Medicine

## 2023-01-14 VITALS — BP 130/74 | HR 90 | Resp 16 | Ht 62.0 in | Wt 103.0 lb

## 2023-01-14 DIAGNOSIS — M25562 Pain in left knee: Secondary | ICD-10-CM | POA: Diagnosis not present

## 2023-01-14 DIAGNOSIS — G8929 Other chronic pain: Secondary | ICD-10-CM | POA: Diagnosis not present

## 2023-01-14 DIAGNOSIS — M25561 Pain in right knee: Secondary | ICD-10-CM | POA: Diagnosis not present

## 2023-01-14 DIAGNOSIS — M545 Low back pain, unspecified: Secondary | ICD-10-CM

## 2023-01-14 MED ORDER — BUPIVACAINE HCL (PF) 0.25 % IJ SOLN
4.0000 mL | Freq: Once | INTRAMUSCULAR | Status: AC
Start: 2023-01-14 — End: 2023-01-14
  Administered 2023-01-14: 4 mL

## 2023-01-14 MED ORDER — LIDOCAINE HCL 1 % IJ SOLN
2.0000 mL | Freq: Once | INTRAMUSCULAR | Status: AC
Start: 2023-01-14 — End: 2023-01-14
  Administered 2023-01-14: 2 mL

## 2023-01-14 MED ORDER — TRIAMCINOLONE ACETONIDE 40 MG/ML IJ SUSP
40.0000 mg | Freq: Once | INTRAMUSCULAR | Status: AC
Start: 2023-01-14 — End: 2023-01-14
  Administered 2023-01-14: 40 mg via INTRA_ARTICULAR

## 2023-01-14 NOTE — Patient Instructions (Signed)
Thank you, Terry Allen for allowing [place] to provide your care today.    Go to the emergency room with any usual pain, swelling, or redness occurred in the injected area.    Follow up in one month or sooner if needed  to see how your back and knee pain is doing.        Thurmon Fair, M.D.

## 2023-01-14 NOTE — Progress Notes (Unsigned)
   HPI:Terry Allen is a 82 y.o. female who presents for evaluation of Back Pain (Left sided pain in back for about 6 months) and Leg Pain (Bilateral leg pain, more so in the right leg since sometimes last year. ) . For the details of today's visit, please refer to the assessment and plan.  Physical Exam: Vitals:   01/14/23 1613  BP: 130/74  Pulse: 90  Resp: 16  SpO2: 94%  Weight: 103 lb (46.7 kg)  Height: 5\' 2"  (1.575 m)     Physical Exam   Assessment & Plan:   There are no diagnoses linked to this encounter.    Milus Banister, MD

## 2023-01-16 DIAGNOSIS — M549 Dorsalgia, unspecified: Secondary | ICD-10-CM | POA: Insufficient documentation

## 2023-01-16 DIAGNOSIS — G8929 Other chronic pain: Secondary | ICD-10-CM | POA: Insufficient documentation

## 2023-01-16 NOTE — Assessment & Plan Note (Addendum)
Left knee was xrayed in 2009 and showed minimal medial compartment joint space narrowing. Patient reports worsening of reflux when taking NSAIDS. She would like to proceed with steroid injections in both knees today. Follow up in one month.  Injection/Aspiration Terry Allen July 01, 1941  Aspiration/Injection Procedure Note Terry Allen 1941/08/13  Procedure: Injection Indications: left knee osteoarthitis Risks of procedure as well as the alternatives and risks of each were explained to the patient. Consent for procedure obtained.    After a time out was preformed, the knee was prepped in a sterile fashion. Cold spray was applied to the skin over the insertion site. The left knee superior lateral suprapatellar pouch was injected using 2 cc of 1% lidocaine on a 23-gauge 1-1/2 inch needle.  The syringe was switched to mixture containing 1 cc's of 40 mg Kenalog and 4 cc's of .25% Bupivacaine was injected.  Ultrasound was used. A sterile dressing was applied. The patient tolerated the procedures well without complication.   Aspiration/Injection Procedure Note Terry Allen 1941-01-26  Procedure: Injection Indications: right knee osteoarthitis Risks of procedure as well as the alternatives and risks of each were explained to the patient. Consent for procedure obtained.    After a time out was preformed, the knee was prepped in a sterile fashion. Cold spray was applied to the skin over the insertion site. The right knee superior lateral suprapatellar pouch was injected using 2 cc of 1% lidocaine on a 23-gauge 1-1/2 inch needle.  The syringe was switched to mixture containing 1 cc's of 40 mg Kenalog and 4 cc's of .25% Bupivacaine was injected.  Ultrasound was used. A sterile dressing was applied. The patient tolerated the procedures well without complication.

## 2023-01-17 ENCOUNTER — Telehealth: Payer: Self-pay | Admitting: Family Medicine

## 2023-01-17 NOTE — Telephone Encounter (Signed)
Called patient to schedule Medicare Annual Wellness Visit (AWV). No voicemail available to leave a message.  Last date of AWV: 10/09/2016  Please schedule an appointment at any time with Abby, NHA. .  If any questions, please contact me at [phone].  Thank you,  Judeth Cornfield,  AMB Clinical Support [facility] AWV Program Direct Dial ??[phone]

## 2023-01-27 ENCOUNTER — Encounter: Payer: Self-pay | Admitting: Family Medicine

## 2023-01-27 ENCOUNTER — Ambulatory Visit (INDEPENDENT_AMBULATORY_CARE_PROVIDER_SITE_OTHER): Payer: Medicare HMO | Admitting: Family Medicine

## 2023-01-27 VITALS — BP 126/66 | HR 83 | Ht 62.0 in | Wt 103.1 lb

## 2023-01-27 DIAGNOSIS — S50861A Insect bite (nonvenomous) of right forearm, initial encounter: Secondary | ICD-10-CM | POA: Diagnosis not present

## 2023-01-27 DIAGNOSIS — W57XXXA Bitten or stung by nonvenomous insect and other nonvenomous arthropods, initial encounter: Secondary | ICD-10-CM | POA: Insufficient documentation

## 2023-01-27 MED ORDER — DOXYCYCLINE HYCLATE 100 MG PO TABS
100.0000 mg | ORAL_TABLET | Freq: Two times a day (BID) | ORAL | 0 refills | Status: AC
Start: 2023-01-27 — End: 2023-02-06

## 2023-01-27 NOTE — Assessment & Plan Note (Signed)
Will treat prophylactically for Lyme disease with doxycycline 100 mg twice daily for 10 days Encouraged to wear long sleeves and long pants when outdoors and light-colored clothing

## 2023-01-27 NOTE — Progress Notes (Signed)
Acute Office Visit  Subjective:    Patient ID: Terry Allen, female    DOB: 12-07-1940, 82 y.o.   MRN: [id number]  Chief Complaint  Patient presents with   Insect Bite    Pt reports 20+ tick bites, would like to screen for lyme disease    HPI Patient is in today with complaints of tick bite on her shoulders and inner thigh 2 days ago. she [place] where she has had several tick bites.  No fever or chills reported.Complaints of increased fatigue.  No headache, stiff neck reported.  No muscle aches and pain reported.  Past Medical History:  Diagnosis Date   Acid reflux    Patient reports Barretts   Bulging lumbar disc    High cholesterol    Hypothyroidism    Nerve damage    Osteopenia    Pelvic pain in female    due to bladder sling    Past Surgical History:  Procedure Laterality Date   APPENDECTOMY  1960   BIOPSY  11/03/2019   Procedure: BIOPSY;  Surgeon: Malissa Hippo, MD;  [facility];  Service: Endoscopy;;   BLADDER SUSPENSION     CATARACT EXTRACTION W/PHACO Right 06/07/2019   Procedure: CATARACT EXTRACTION PHACO AND INTRAOCULAR LENS PLACEMENT ([facility]);  Surgeon: Fabio Pierce, MD;  [facility]: [facility];  Service: Ophthalmology;  Laterality: Right;  CDE: 10.87   CATARACT EXTRACTION W/PHACO Left 06/21/2019   Procedure: CATARACT EXTRACTION PHACO AND INTRAOCULAR LENS PLACEMENT (I[facility]);  Surgeon: Fabio Pierce, MD;  [facility]: [facility];  Service: Ophthalmology;  Laterality: Left;  CDE: 11.59   COLONOSCOPY N/A 11/03/2019   Procedure: COLONOSCOPY;  Surgeon: Malissa Hippo, MD;  [facility];  Service: Endoscopy;  Laterality: N/A;  1245   ESOPHAGEAL DILATION N/A 05/27/2018   Procedure: ESOPHAGEAL DILATION;  Surgeon: Malissa Hippo, MD;  [facility];  Service: Endoscopy;  Laterality: N/A;   ESOPHAGEAL DILATION N/A 02/12/2019   Procedure: ESOPHAGEAL DILATION;  Surgeon: Malissa Hippo, MD;  [facility];  Service:  Endoscopy;  Laterality: N/A;   ESOPHAGEAL DILATION N/A 02/14/2022   Procedure: ESOPHAGEAL DILATION;  Surgeon: Malissa Hippo, MD;  [facility];  Service: Endoscopy;  Laterality: N/A;   ESOPHAGEAL DILATION N/A 02/27/2022   Procedure: ESOPHAGEAL DILATION;  Surgeon: Dolores Frame, MD;  [facility];  Service: Gastroenterology;  Laterality: N/A;   ESOPHAGOGASTRODUODENOSCOPY N/A 12/21/2014   Procedure: ESOPHAGOGASTRODUODENOSCOPY (EGD);  Surgeon: Malissa Hippo, MD;  [facility];  Service: Endoscopy;  Laterality: N/A;  210   ESOPHAGOGASTRODUODENOSCOPY N/A 06/16/2015   Procedure: ESOPHAGOGASTRODUODENOSCOPY (EGD);  Surgeon: Malissa Hippo, MD;  [facility];  Service: Endoscopy;  Laterality: N/A;  1250   ESOPHAGOGASTRODUODENOSCOPY N/A 05/27/2018   Procedure: ESOPHAGOGASTRODUODENOSCOPY (EGD);  Surgeon: Malissa Hippo, MD;  [facility];  Service: Endoscopy;  Laterality: N/A;  7:30   ESOPHAGOGASTRODUODENOSCOPY N/A 02/12/2019   Procedure: ESOPHAGOGASTRODUODENOSCOPY (EGD);  Surgeon: Malissa Hippo, MD;  [facility];  Service: Endoscopy;  Laterality: N/A;   ESOPHAGOGASTRODUODENOSCOPY (EGD) WITH PROPOFOL N/A 02/14/2022   Procedure: ESOPHAGOGASTRODUODENOSCOPY (EGD) WITH PROPOFOL;  Surgeon: Malissa Hippo, MD;  [facility];  Service: Endoscopy;  Laterality: N/A;  920   ESOPHAGOGASTRODUODENOSCOPY (EGD) WITH PROPOFOL N/A 02/27/2022   Procedure: ESOPHAGOGASTRODUODENOSCOPY (EGD) WITH PROPOFOL;  Surgeon: Dolores Frame, MD;  [facility];  Service: Gastroenterology;  Laterality: N/A;  1:15 Per Dr Levon Hedger asa 1    Family History  Problem Relation Age of Onset   Breast cancer Mother    Heart disease Mother    Diabetes Mother    Cancer Mother        breast   Stroke Mother    Hypertension Mother    Lung cancer Father    Stroke Father    Cancer Father        oat cell cancer/lung   Kidney cancer  Brother    Cancer Brother        renal cell cancer   Healthy Brother    Diabetes Son     Social History   Socioeconomic History   Marital status: Divorced    Spouse name: Not on file   Number of children: Not on file   Years of education: Not on file   Highest education level: Not on file  Occupational History   Not on file  Tobacco Use   Smoking status: Never    Passive exposure: Never   Smokeless tobacco: Never  Vaping Use   Vaping Use: Never used  Substance and Sexual Activity   Alcohol use: No    Alcohol/week: 0.0 standard drinks of alcohol   Drug use: No   Sexual activity: Never  Other Topics Concern   Not on file  Social History Narrative   Not on file   Social Determinants of Health   Financial Resource Strain: Not on file  Food Insecurity: Not on file  Transportation Needs: Not on file  Physical Activity: Not on file  Stress: Not on file  Social Connections: Not on file  Intimate Partner Violence: Not on file    Outpatient Medications Prior to Visit  Medication Sig Dispense Refill   acetaminophen (TYLENOL) 325 MG tablet Take 2 tablets (650 mg total) by mouth every 6 (six) hours as needed for mild pain (or Fever >/= 101).     albuterol (VENTOLIN HFA) 108 (90 Base) MCG/ACT inhaler Inhale 2 puffs into the lungs every 4 (four) hours as needed for wheezing or shortness of breath. 1 each 3   amitriptyline (ELAVIL) 50 MG tablet Take 50 mg by mouth at bedtime.   2   atorvastatin (LIPITOR) 40 MG tablet Take 40 mg by mouth at bedtime.      Calcium Carb-Cholecalciferol (CALCIUM 600 + D PO) Take 1 tablet by mouth 2 (two) times a day.     dicyclomine (BENTYL) 10 MG capsule Take 1 capsule (10 mg total) by mouth every 12 (twelve) hours as needed for spasms (abdominal pain). 60 capsule 2   famotidine (PEPCID) 20 MG tablet Take 20 mg by mouth 2 (two) times daily.     gabapentin (NEURONTIN) 300 MG capsule Take 1 capsule (300 mg total) by mouth 3 (three) times daily.      levothyroxine (SYNTHROID) 50 MCG tablet Take 50 mcg by mouth every other day.     levothyroxine (SYNTHROID) 75 MCG tablet Take 75 mcg by mouth every 3 (three) days. Alternating with 50 mcg.     ondansetron (ZOFRAN-ODT) 4 MG disintegrating tablet Take 1 tablet (4 mg total) by mouth every 8 (eight) hours as needed for nausea or vomiting. 60 tablet 2   phenazopyridine (AZO URINARY PAIN RELIEF) 95 MG tablet Take 1 tablet (95 mg total) by mouth 3 (three) times daily as needed for pain. 10 tablet 0   potassium chloride SA (KLOR-CON M) 20 MEQ tablet Take 1 tablet (20 mEq  total) by mouth 2 (two) times daily. 4 tablet 0   No facility-administered medications prior to visit.    No Known Allergies  Review of Systems  Constitutional:  Positive for fatigue. Negative for chills and fever.  Eyes:  Negative for visual disturbance.  Respiratory:  Negative for chest tightness and shortness of breath.   Skin:  Positive for rash.  Neurological:  Negative for dizziness and headaches.       Objective:    Physical Exam HENT:     Head: Normocephalic.     Mouth/Throat:     Mouth: Mucous membranes are moist.  Cardiovascular:     Rate and Rhythm: Normal rate.     Heart sounds: Normal heart sounds.  Pulmonary:     Effort: Pulmonary effort is normal.     Breath sounds: Normal breath sounds.  Skin:    Findings: Rash (Erythematous rash noted on the arms and inner thigh) present.  Neurological:     Mental Status: She is alert.     BP 126/66   Pulse 83   Ht 5\' 2"  (1.575 m)   Wt 103 lb 1.9 oz (46.8 kg)   SpO2 97%   BMI 18.86 kg/m  Wt Readings from Last 3 Encounters:  01/27/23 103 lb 1.9 oz (46.8 kg)  01/14/23 103 lb (46.7 kg)  12/11/22 103 lb (46.7 kg)       Assessment & Plan:  Tick bite of right forearm, initial encounter Assessment & Plan: Will treat prophylactically for Lyme disease with doxycycline 100 mg twice daily for 10 days Encouraged to wear long sleeves and long pants when outdoors  and light-colored clothing  Orders: -     Doxycycline Hyclate; Take 1 tablet (100 mg total) by mouth 2 (two) times daily for 10 days.  Dispense: 20 tablet; Refill: 0    Gilmore Laroche, FNP

## 2023-01-27 NOTE — Patient Instructions (Addendum)
I appreciate the opportunity to provide care to you today!    Follow up:  with PCP  Please start taking doxycycline 100 mg twice daily for 10 days for prophylactic treatment of Lyme disease     How can I prevent tick bites? Take these steps: Before you go outside: Wear long sleeves and long pants. Wear light-colored clothes. Tuck your pant legs into your socks. Use an insect repellent that has 20% or higher of the ingredients DEET, picaridin, or IR3535. Follow the instructions on the label. Put it on: Bare skin. Avoid your eyes and mouth areas. The tops of your boots. Your pant legs. The ends of your sleeves. If you use an insect repellent that has the ingredient permethrin, follow the instructions on the label. Do not put permethrin on the skin. Put it on: Clothing. Shoes. Outdoor gear. Tents. When you are outside Avoid walking through long grass. Stay in the middle of the trail. Do not touch the bushes. Check for ticks on your clothes, hair, and skin often while you are outside. Check again before you go inside. When you go indoors Check your clothes for ticks. Dry your clothes in a dryer on high heat for 10 minutes or more. If clothes are damp, more time may be needed. Wash your clothes right away if they need to be washed. Use hot water. Check your pets and outdoor gear. Shower right away. Check your body for ticks. Do a full body check using a mirror. Check your clothes, skin, head, neck, armpits, waist, groin, and joint areas.   Please continue to a heart-healthy diet and increase your physical activities. Try to exercise for at least five days a week.      It was a pleasure to see you and I look forward to continuing to work together on your health and well-being. Please do not hesitate to call the office if you need care or have questions about your care.   Have a wonderful day and week. With Gratitude, Gilmore Laroche MSN, FNP-BC

## 2023-01-30 ENCOUNTER — Encounter: Payer: Self-pay | Admitting: Urology

## 2023-01-30 ENCOUNTER — Ambulatory Visit: Payer: Medicare HMO | Admitting: Urology

## 2023-01-30 VITALS — BP 137/69 | HR 71

## 2023-01-30 DIAGNOSIS — R3129 Other microscopic hematuria: Secondary | ICD-10-CM | POA: Diagnosis not present

## 2023-01-30 DIAGNOSIS — Z87440 Personal history of urinary (tract) infections: Secondary | ICD-10-CM

## 2023-01-30 DIAGNOSIS — N390 Urinary tract infection, site not specified: Secondary | ICD-10-CM | POA: Diagnosis not present

## 2023-01-30 DIAGNOSIS — N3941 Urge incontinence: Secondary | ICD-10-CM

## 2023-01-30 LAB — URINALYSIS, ROUTINE W REFLEX MICROSCOPIC
Bilirubin, UA: NEGATIVE
Glucose, UA: NEGATIVE
Ketones, UA: NEGATIVE
Leukocytes,UA: NEGATIVE
Nitrite, UA: NEGATIVE
Protein,UA: NEGATIVE
Specific Gravity, UA: 1.015 (ref 1.005–1.030)
Urobilinogen, Ur: 0.2 mg/dL (ref 0.2–1.0)
pH, UA: 6 (ref 5.0–7.5)

## 2023-01-30 LAB — MICROSCOPIC EXAMINATION: Bacteria, UA: NONE SEEN

## 2023-01-30 LAB — BLADDER SCAN

## 2023-01-30 LAB — BLADDER SCAN AMB NON-IMAGING

## 2023-01-30 NOTE — Progress Notes (Signed)
Assessment: 1. Recurrent UTI   2. Urge incontinence     Plan: Continue methods to reduce the risk of recurrent UTIs including increased fluid intake, timed and double voiding, daily cranberry supplement, and daily probiotic reviewed. Continue Myrbetriq 50 mg daily.   Continue daily Macrobid for UTI prevention I discussed further evaluation of her incontinence with urodynamics at [facility].  She would like to continue medical therapy for now. Return to office in 6 weeks.   Chief Complaint:  Chief Complaint  Patient presents with   Urinary Tract Infection    recurrent    History of Present Illness:  Terry Allen is a 82 y.o. year old female who is seen for further evaluation of UTI's and urge incontinence. She has a history of chronic UTIs and has been followed by Dr. Patsi Sears and Dr. Alvester Morin at [facility].  She is status post a Pinnacle bladder sling and transobturator urethral sling by Dr. Henderson Cloud in 2009.  She was treated for vaginal mesh extrusion by Dr. Mia Creek in August 2011.  She has had longstanding symptoms of frequency, urgency, and urge incontinence.  She has previously been managed with medical therapy for her symptoms.  She also has a history of recurrent UTIs.  She was previously treated with daily antibiotic prophylaxis but has not been on this for some time.  At her last visit in February 2022, she did not have evidence of UTI.  Renal ultrasound from 1/22 did not demonstrate any renal or bladder abnormalities.  She was being managed with vaginal Estrace cream and a daily probiotic.  She reports recurrent UTIs since June 2022.  Typical symptoms include low back pain, pelvic pain, nausea, and some dysuria.  She continues with her baseline symptoms of frequency, urgency, nocturia x2, and urge incontinence.  No flank pain or gross hematuria.  She was last treated for a UTI approximately 1 week ago.   Urine culture from 9/22 grew 30 K colonies of E. coli.    At her visit in 2/23, she reported suprapubic discomfort, frequency, burning sensation in the bladder area, urgency, and incontinence. She reported symptoms of fecal incontinence.  Urine culture from 10/12/2021 grew >100 K E. coli.  She was treated with Macrobid and started on daily Macrobid. She was seen on 12/03/2021 with UTI symptoms.  A urine culture had been obtained through her PCP.  No results available.  She was being treated with Bactrim. She had run out of Myrbetriq and this was resumed.  She presents today for cystoscopy and pelvic exam. She continues on daily Macrodantin and Myrbetriq.  She has urinary frequency, urgency, and continued urge incontinence.  She does feel like the Myrbetriq has improved her symptoms.  No side effects.  Portions of the above documentation were copied from a prior visit for review purposes only.   Past Medical History:  Past Medical History:  Diagnosis Date   Acid reflux    Patient reports Barretts   Bulging lumbar disc    High cholesterol    Hypothyroidism    Nerve damage    Osteopenia    Pelvic pain in female    due to bladder sling    Past Surgical History:  Past Surgical History:  Procedure Laterality Date   APPENDECTOMY  1960   BIOPSY  11/03/2019   Procedure: BIOPSY;  Surgeon: Malissa Hippo, MD;  Location: [facility];  Service: Endoscopy;;   BLADDER SUSPENSION     CATARACT EXTRACTION W/PHACO Right 06/07/2019  Procedure: CATARACT EXTRACTION PHACO AND INTRAOCULAR LENS PLACEMENT (IOC);  Surgeon: Fabio Pierce, MD;  Location: [facility];  Service: Ophthalmology;  Laterality: Right;  CDE: 10.87   CATARACT EXTRACTION W/PHACO Left 06/21/2019   Procedure: CATARACT EXTRACTION PHACO AND INTRAOCULAR LENS PLACEMENT (IOC);  Surgeon: Fabio Pierce, MD;  Location: [facility];  Service: Ophthalmology;  Laterality: Left;  CDE: 11.59   COLONOSCOPY N/A 11/03/2019   Procedure: COLONOSCOPY;  Surgeon: Malissa Hippo, MD;  [facility]: [facility];   Service: Endoscopy;  Laterality: N/A;  1245   ESOPHAGEAL DILATION N/A 05/27/2018   Procedure: ESOPHAGEAL DILATION;  Surgeon: Malissa Hippo, MD;  [facility]: [facility];  Service: Endoscopy;  Laterality: N/A;   ESOPHAGEAL DILATION N/A 02/12/2019   Procedure: ESOPHAGEAL DILATION;  Surgeon: Malissa Hippo, MD;  [facility]: [facility];  Service: Endoscopy;  Laterality: N/A;   ESOPHAGEAL DILATION N/A 02/14/2022   Procedure: ESOPHAGEAL DILATION;  Surgeon: Malissa Hippo, MD;  [facility]: [facility];  Service: Endoscopy;  Laterality: N/A;   ESOPHAGEAL DILATION N/A 02/27/2022   Procedure: ESOPHAGEAL DILATION;  Surgeon: Dolores Frame, MD;  [facility]: [facility];  Service: Gastroenterology;  Laterality: N/A;   ESOPHAGOGASTRODUODENOSCOPY N/A 12/21/2014   Procedure: ESOPHAGOGASTRODUODENOSCOPY (EGD);  Surgeon: Malissa Hippo, MD;  [facility]: [facility];  Service: Endoscopy;  Laterality: N/A;  210   ESOPHAGOGASTRODUODENOSCOPY N/A 06/16/2015   Procedure: ESOPHAGOGASTRODUODENOSCOPY (EGD);  Surgeon: Malissa Hippo, MD;  [facility];  Service: Endoscopy;  Laterality: N/A;  1250   ESOPHAGOGASTRODUODENOSCOPY N/A 05/27/2018   Procedure: ESOPHAGOGASTRODUODENOSCOPY (EGD);  Surgeon: Malissa Hippo, MD;  [facility]: [facility];  Service: Endoscopy;  Laterality: N/A;  7:30   ESOPHAGOGASTRODUODENOSCOPY N/A 02/12/2019   Procedure: ESOPHAGOGASTRODUODENOSCOPY (EGD);  Surgeon: Malissa Hippo, MD;  [facility]: [facility];  Service: Endoscopy;  Laterality: N/A;   ESOPHAGOGASTRODUODENOSCOPY (EGD) WITH PROPOFOL N/A 02/14/2022   Procedure: ESOPHAGOGASTRODUODENOSCOPY (EGD) WITH PROPOFOL;  Surgeon: Malissa Hippo, MD;  [facility]: [facility];  Service: Endoscopy;  Laterality: N/A;  920   ESOPHAGOGASTRODUODENOSCOPY (EGD) WITH PROPOFOL N/A 02/27/2022   Procedure: ESOPHAGOGASTRODUODENOSCOPY (EGD) WITH PROPOFOL;  Surgeon: Dolores Frame, MD;  [facility]: [facility];  Service:  Gastroenterology;  Laterality: N/A;  1:15 Per Dr Levon Hedger asa 1    Allergies:  No Known Allergies  Family History:  Family History  Problem Relation Age of Onset   Breast cancer Mother    Heart disease Mother    Diabetes Mother    Cancer Mother        breast   Stroke Mother    Hypertension Mother    Lung cancer Father    Stroke Father    Cancer Father        oat cell cancer/lung   Kidney cancer Brother    Cancer Brother        renal cell cancer   Healthy Brother    Diabetes Son     Social History:  Social History   Tobacco Use   Smoking status: Never    Passive exposure: Never   Smokeless tobacco: Never  Vaping Use   Vaping Use: Never used  Substance Use Topics   Alcohol use: No    Alcohol/week: 0.0 standard drinks of alcohol   Drug use: No    ROS: Constitutional:  Negative for fever, chills, weight loss CV: Negative for chest pain, previous MI, hypertension Respiratory:  Negative for shortness of breath, wheezing, sleep apnea, frequent cough GI:  Negative for nausea, vomiting, bloody stool, GERD  Physical exam: GENERAL APPEARANCE:  Well appearing, well developed, well nourished, NAD HEENT:  Atraumatic, normocephalic, oropharynx clear NECK:  Supple without lymphadenopathy or thyromegaly ABDOMEN:  Soft, non-tender, no masses EXTREMITIES:  Moves all extremities well, without clubbing, cyanosis, or edema NEUROLOGIC:  Alert and oriented x 3, normal gait, CN II-XII grossly intact MENTAL STATUS:  appropriate BACK:  Non-tender to palpation, No CVAT SKIN:  Warm, dry, and intact GU: Urethra: well supported, no leakage with cough or valsalva Vagina: atrophic changes, grade 2-3 cystocele, no mesh extrusion   Results: U/A: 0-5 WBC< 0-2 RBC, mod bacteria  Procedure:  Flexible Cystourethroscopy  Pre-operative Diagnosis:  UTI, urinary incontinence  Post-operative Diagnosis:  UTI, urinary incontinence  Anesthesia:  local with lidocaine jelly  Surgical  Narrative:  After appropriate informed consent was obtained, the patient was prepped and draped in the usual sterile fashion in the supine position.  The patient was correctly identified and the proper procedure delineated prior to proceeding.  Sterile lidocaine gel was instilled in the urethra. The flexible cystoscope was introduced without difficulty.  Findings:  Urethra: Normal  Bladder:  squamous metaplasia, chronic cystitis  Ureteral orifices: normal  Additional findings:  bladder empty at beginning of exam  Saline bladder wash for cytology was not performed.    The cystoscope was then removed.  The patient tolerated the procedure well.

## 2023-01-30 NOTE — Progress Notes (Signed)
Subjective: 1. Recurrent UTI   2. Urge incontinence   3. Microhematuria     01/30/23: Terry Allen returns today in f/u for her history of UTI's and UUI.  She is currently on doxycycline for a tick bite.  Her UA has 3-10 RBC's today.  She had cystoscopy last year.   She had a CT [facility] on 12/31/22 and no urologic findings were noted.  She had previously been on daily Myrbetriq and macrodantin but is off of both of those.    Terry Allen is a 82 y.o. year old female who is seen for further evaluation of UTI's and urge incontinence. She last had a UTI on 4/3 with e. Coli.   Her PVR is 0ml.  She has a history of chronic UTIs and has been followed by Dr. Patsi Sears and Dr. Alvester Morin at [facility].  She is status post a Pinnacle bladder sling and transobturator urethral sling by Dr. Henderson Cloud in 2009.  She was treated for vaginal mesh extrusion by Dr. Mia Creek in August 2011.  She has had longstanding symptoms of frequency, urgency, and urge incontinence.  She has previously been managed with medical therapy for her symptoms.  She also has a history of recurrent UTIs.  She was previously treated with daily antibiotic prophylaxis but has not been on this for some time.  At her last visit in February 2022, she did not have evidence of UTI.  Renal ultrasound from 1/22 did not demonstrate any renal or bladder abnormalities.  She was being managed with vaginal Estrace cream and a daily probiotic.  She reports recurrent UTIs since June 2022.  Typical symptoms include low back pain, pelvic pain, nausea, and some dysuria.  She continues with her baseline symptoms of frequency, urgency, nocturia x2, and urge incontinence.  No flank pain or gross hematuria.  She was last treated for a UTI approximately 1 week ago.   Urine culture from 9/22 grew 30 K colonies of E. coli.   At her visit in 2/23, she reported suprapubic discomfort, frequency, burning sensation in the bladder area, urgency, and incontinence. She reported  symptoms of fecal incontinence.  Urine culture from 10/12/2021 grew >100 K E. coli.  She was treated with Macrobid and started on daily Macrobid. She was seen on 12/03/2021 with UTI symptoms.  A urine culture had been obtained through her PCP.  No results available.  She was being treated with Bactrim. She had run out of Myrbetriq and this was resumed.  She presents today for cystoscopy and pelvic exam. She continues on daily Macrodantin and Myrbetriq.  She has urinary frequency, urgency, and continued urge incontinence.  She does feel like the Myrbetriq has improved her symptoms.  No side effects.   ROS:  Review of Systems  Genitourinary:  Positive for frequency and urgency.  Musculoskeletal:  Positive for back pain.  Neurological:  Positive for weakness.  Endo/Heme/Allergies:  Bruises/bleeds easily.    No Known Allergies  Past Medical History:  Diagnosis Date   Acid reflux    Patient reports Barretts   Bulging lumbar disc    High cholesterol    Hypothyroidism    Nerve damage    Osteopenia    Pelvic pain in female    due to bladder sling    Past Surgical History:  Procedure Laterality Date   APPENDECTOMY  1960   BIOPSY  11/03/2019   Procedure: BIOPSY;  Surgeon: Malissa Hippo, MD;  [facility];  Service: Endoscopy;;  BLADDER SUSPENSION     CATARACT EXTRACTION W/PHACO Right 06/07/2019   Procedure: CATARACT EXTRACTION PHACO AND INTRAOCULAR LENS PLACEMENT (IOC);  Surgeon: Fabio Pierce, MD;  Location: [facility];  Service: Ophthalmology;  Laterality: Right;  CDE: 10.87   CATARACT EXTRACTION W/PHACO Left 06/21/2019   Procedure: CATARACT EXTRACTION PHACO AND INTRAOCULAR LENS PLACEMENT (IOC);  Surgeon: Fabio Pierce, MD;  Location: [facility];  Service: Ophthalmology;  Laterality: Left;  CDE: 11.59   COLONOSCOPY N/A 11/03/2019   Procedure: COLONOSCOPY;  Surgeon: Malissa Hippo, MD;  [facility];  Service: Endoscopy;  Laterality: N/A;  1245   ESOPHAGEAL  DILATION N/A 05/27/2018   Procedure: ESOPHAGEAL DILATION;  Surgeon: Malissa Hippo, MD;  [facility];  Service: Endoscopy;  Laterality: N/A;   ESOPHAGEAL DILATION N/A 02/12/2019   Procedure: ESOPHAGEAL DILATION;  Surgeon: Malissa Hippo, MD;  [facility];  Service: Endoscopy;  Laterality: N/A;   ESOPHAGEAL DILATION N/A 02/14/2022   Procedure: ESOPHAGEAL DILATION;  Surgeon: Malissa Hippo, MD;  [facility];  Service: Endoscopy;  Laterality: N/A;   ESOPHAGEAL DILATION N/A 02/27/2022   Procedure: ESOPHAGEAL DILATION;  Surgeon: Dolores Frame, MD;  [facility];  Service: Gastroenterology;  Laterality: N/A;   ESOPHAGOGASTRODUODENOSCOPY N/A 12/21/2014   Procedure: ESOPHAGOGASTRODUODENOSCOPY (EGD);  Surgeon: Malissa Hippo, MD;  [facility];  Service: Endoscopy;  Laterality: N/A;  210   ESOPHAGOGASTRODUODENOSCOPY N/A 06/16/2015   Procedure: ESOPHAGOGASTRODUODENOSCOPY (EGD);  Surgeon: Malissa Hippo, MD;  [facility];  Service: Endoscopy;  Laterality: N/A;  1250   ESOPHAGOGASTRODUODENOSCOPY N/A 05/27/2018   Procedure: ESOPHAGOGASTRODUODENOSCOPY (EGD);  Surgeon: Malissa Hippo, MD;  [facility];  Service: Endoscopy;  Laterality: N/A;  7:30   ESOPHAGOGASTRODUODENOSCOPY N/A 02/12/2019   Procedure: ESOPHAGOGASTRODUODENOSCOPY (EGD);  Surgeon: Malissa Hippo, MD;  [facility];  Service: Endoscopy;  Laterality: N/A;   ESOPHAGOGASTRODUODENOSCOPY (EGD) WITH PROPOFOL N/A 02/14/2022   Procedure: ESOPHAGOGASTRODUODENOSCOPY (EGD) WITH PROPOFOL;  Surgeon: Malissa Hippo, MD;  [facility];  Service: Endoscopy;  Laterality: N/A;  920   ESOPHAGOGASTRODUODENOSCOPY (EGD) WITH PROPOFOL N/A 02/27/2022   Procedure: ESOPHAGOGASTRODUODENOSCOPY (EGD) WITH PROPOFOL;  Surgeon: Dolores Frame, MD;  [facility];  Service: Gastroenterology;  Laterality: N/A;  1:15 Per Dr Levon Hedger asa 1     Social History   Socioeconomic History   Marital status: Divorced    Spouse name: Not on file   Number of children: Not on file   Years of education: Not on file   Highest education level: Not on file  Occupational History   Not on file  Tobacco Use   Smoking status: Never    Passive exposure: Never   Smokeless tobacco: Never  Vaping Use   Vaping Use: Never used  Substance and Sexual Activity   Alcohol use: No    Alcohol/week: 0.0 standard drinks of alcohol   Drug use: No   Sexual activity: Never  Other Topics Concern   Not on file  Social History Narrative   Not on file   Social Determinants of Health   Financial Resource Strain: Not on file  Food Insecurity: Not on file  Transportation Needs: Not on file  Physical Activity: Not on file  Stress: Not on file  Social Connections: Not on file  Intimate Partner Violence: Not on file    Family History  Problem Relation Age of Onset   Breast cancer Mother  Heart disease Mother    Diabetes Mother    Cancer Mother        breast   Stroke Mother    Hypertension Mother    Lung cancer Father    Stroke Father    Cancer Father        oat cell cancer/lung   Kidney cancer Brother    Cancer Brother        renal cell cancer   Healthy Brother    Diabetes Son     Anti-infectives: Anti-infectives (From admission, onward)    None       Current Outpatient Medications  Medication Sig Dispense Refill   acetaminophen (TYLENOL) 325 MG tablet Take 2 tablets (650 mg total) by mouth every 6 (six) hours as needed for mild pain (or Fever >/= 101).     albuterol (VENTOLIN HFA) 108 (90 Base) MCG/ACT inhaler Inhale 2 puffs into the lungs every 4 (four) hours as needed for wheezing or shortness of breath. 1 each 3   amitriptyline (ELAVIL) 50 MG tablet Take 50 mg by mouth at bedtime.   2   atorvastatin (LIPITOR) 40 MG tablet Take 40 mg by mouth at bedtime.      Calcium Carb-Cholecalciferol (CALCIUM 600 + D PO) Take 1 tablet  by mouth 2 (two) times a day.     doxycycline (VIBRA-TABS) 100 MG tablet Take 1 tablet (100 mg total) by mouth 2 (two) times daily for 10 days. 20 tablet 0   famotidine (PEPCID) 20 MG tablet Take 20 mg by mouth 2 (two) times daily.     gabapentin (NEURONTIN) 300 MG capsule Take 1 capsule (300 mg total) by mouth 3 (three) times daily.     levothyroxine (SYNTHROID) 50 MCG tablet Take 50 mcg by mouth every other day.     levothyroxine (SYNTHROID) 75 MCG tablet Take 75 mcg by mouth every 3 (three) days. Alternating with 50 mcg.     ondansetron (ZOFRAN-ODT) 4 MG disintegrating tablet Take 1 tablet (4 mg total) by mouth every 8 (eight) hours as needed for nausea or vomiting. 60 tablet 2   potassium chloride SA (KLOR-CON M) 20 MEQ tablet Take 1 tablet (20 mEq total) by mouth 2 (two) times daily. 4 tablet 0   No current facility-administered medications for this visit.     Objective: Vital signs in last 24 hours: BP 137/69   Pulse 71   Intake/Output from previous day: No intake/output data recorded. Intake/Output this shift: @IOTHISSHIFT @   Physical Exam  Lab Results:  Recent Results (from the past 2160 hour(s))  POCT Urinalysis Dipstick     Status: Abnormal   Collection Time: 12/11/22  3:45 PM  Result Value Ref Range   Color, UA yellow    Clarity, UA clear    Glucose, UA Negative Negative   Bilirubin, UA negative    Ketones, UA negative    Spec Grav, UA 1.015 1.010 - 1.025   Blood, UA positive    pH, UA 6.0 5.0 - 8.0   Protein, UA Positive (A) Negative   Urobilinogen, UA 0.2 0.2 or 1.0 E.U./dL   Nitrite, UA negative    Leukocytes, UA Small (1+) (A) Negative   Appearance     Odor    Urine Culture     Status: Abnormal   Collection Time: 12/11/22  4:00 PM   Specimen: Urine   UR  Result Value Ref Range   Urine Culture, Routine Final report (A)    Organism ID, Bacteria  Escherichia coli (A)     Comment: Cefazolin <=4 ug/mL Cefazolin with an MIC <=16 predicts susceptibility to  the oral agents cefaclor, cefdinir, cefpodoxime, cefprozil, cefuroxime, cephalexin, and loracarbef when used for therapy of uncomplicated urinary tract infections due to E. coli, Klebsiella pneumoniae, and Proteus mirabilis. Greater than 100,000 colony forming units per mL    Antimicrobial Susceptibility Comment     Comment:       ** S = Susceptible; I = Intermediate; R = Resistant **                    P = Positive; N = Negative             MICS are expressed in micrograms per mL    Antibiotic                 RSLT#1    RSLT#2    RSLT#3    RSLT#4 Amoxicillin/Clavulanic Acid    S Ampicillin                     S Cefepime                       S Ceftriaxone                    S Cefuroxime                     S Ciprofloxacin                  S Ertapenem                      S Gentamicin                     S Imipenem                       S Levofloxacin                   S Meropenem                      S Nitrofurantoin                 S Piperacillin/Tazobactam        S Tetracycline                   S Tobramycin                     S Trimethoprim/Sulfa             S   CBC with Differential/Platelet     Status: None   Collection Time: 12/12/22  1:02 PM  Result Value Ref Range   WBC 5.3 3.4 - 10.8 x10E3/uL   RBC 4.14 3.77 - 5.28 x10E6/uL   Hemoglobin 12.9 11.1 - 15.9 g/dL   Hematocrit 16.1 09.6 - 46.6 %   MCV 94 79 - 97 fL   MCH 31.2 26.6 - 33.0 pg   MCHC 33.0 31.5 - 35.7 g/dL   RDW 04.5 40.9 - 81.1 %   Platelets 268 150 - 450 x10E3/uL   Neutrophils 59 Not Estab. %   Lymphs 23 Not Estab. %   Monocytes 12 Not Estab. %   Eos 5 Not Estab. %   Basos 1 Not  Estab. %   Neutrophils Absolute 3.1 1.4 - 7.0 x10E3/uL   Lymphocytes Absolute 1.2 0.7 - 3.1 x10E3/uL   Monocytes Absolute 0.6 0.1 - 0.9 x10E3/uL   EOS (ABSOLUTE) 0.3 0.0 - 0.4 x10E3/uL   Basophils Absolute 0.0 0.0 - 0.2 x10E3/uL   Immature Granulocytes 0 Not Estab. %   Immature Grans (Abs) 0.0 0.0 - 0.1 x10E3/uL   CMP14+EGFR     Status: Abnormal   Collection Time: 12/12/22  1:02 PM  Result Value Ref Range   Glucose 93 70 - 99 mg/dL   BUN 7 (L) 8 - 27 mg/dL   Creatinine, Ser 7.82 0.57 - 1.00 mg/dL   eGFR 71 >95 AO/ZHY/8.65   BUN/Creatinine Ratio 8 (L) 12 - 28   Sodium 142 134 - 144 mmol/L   Potassium 4.5 3.5 - 5.2 mmol/L   Chloride 101 96 - 106 mmol/L   CO2 26 20 - 29 mmol/L   Calcium 9.6 8.7 - 10.3 mg/dL   Total Protein 6.7 6.0 - 8.5 g/dL   Albumin 4.1 3.8 - 4.8 g/dL   Globulin, Total 2.6 1.5 - 4.5 g/dL   Albumin/Globulin Ratio 1.6 1.2 - 2.2   Bilirubin Total 0.6 0.0 - 1.2 mg/dL   Alkaline Phosphatase 75 44 - 121 IU/L   AST 39 0 - 40 IU/L   ALT 36 (H) 0 - 32 IU/L  Hemoglobin A1c     Status: Abnormal   Collection Time: 12/12/22  1:02 PM  Result Value Ref Range   Hgb A1c MFr Bld 5.8 (H) 4.8 - 5.6 %    Comment:          Prediabetes: 5.7 - 6.4          Diabetes: >6.4          Glycemic control for adults with diabetes: <7.0    Est. average glucose Bld gHb Est-mCnc 120 mg/dL  Lipid panel     Status: None   Collection Time: 12/12/22  1:02 PM  Result Value Ref Range   Cholesterol, Total 151 100 - 199 mg/dL   Triglycerides 784 0 - 149 mg/dL   HDL 62 >69 mg/dL   VLDL Cholesterol Cal 25 5 - 40 mg/dL   LDL Chol Calc (NIH) 64 0 - 99 mg/dL   Chol/HDL Ratio 2.4 0.0 - 4.4 ratio    Comment:                                   T. Chol/HDL Ratio                                             Men  Women                               1/2 Avg.Risk  3.4    3.3                                   Avg.Risk  5.0    4.4                                2X  Avg.Risk  9.6    7.1                                3X Avg.Risk 23.4   11.0   Microalbumin / creatinine urine ratio     Status: Abnormal   Collection Time: 12/12/22  1:02 PM  Result Value Ref Range   Creatinine, Urine 144.5 Not Estab. mg/dL   Microalbumin, Urine 366.4 Not Estab. ug/mL   Microalb/Creat Ratio 253 (H) 0 - 29 mg/g creat    Comment:                         Normal:                0 -  29                        Moderately increased: 30 - 300                        Severely increased:       >300   TSH + free T4     Status: None   Collection Time: 12/12/22  1:02 PM  Result Value Ref Range   TSH 1.360 0.450 - 4.500 uIU/mL   Free T4 1.11 0.82 - 1.77 ng/dL  Urinalysis, Routine w reflex microscopic     Status: Abnormal   Collection Time: 01/30/23 10:22 AM  Result Value Ref Range   Specific Gravity, UA 1.015 1.005 - 1.030   pH, UA 6.0 5.0 - 7.5   Color, UA Yellow Yellow   Appearance Ur Clear Clear   Leukocytes,UA Negative Negative   Protein,UA Negative Negative/Trace   Glucose, UA Negative Negative   Ketones, UA Negative Negative   RBC, UA 2+ (A) Negative   Bilirubin, UA Negative Negative   Urobilinogen, Ur 0.2 0.2 - 1.0 mg/dL   Nitrite, UA Negative Negative   Microscopic Examination See below:     Comment: Microscopic was indicated and was performed.  Microscopic Examination     Status: Abnormal   Collection Time: 01/30/23 10:22 AM   Urine  Result Value Ref Range   WBC, UA 0-5 0 - 5 /hpf   RBC, Urine 3-10 (A) 0 - 2 /hpf   Epithelial Cells (non renal) 0-10 0 - 10 /hpf   Bacteria, UA None seen None seen/Few  BLADDER SCAN AMB NON-IMAGING     Status: None   Collection Time: 01/30/23 10:30 AM  Result Value Ref Range   Scan Result 0ml   Bladder scan     Status: None   Collection Time: 01/30/23 10:53 AM  Result Value Ref Range   Scan Result 0ml      BMET No results for input(s): "NA", "K", "CL", "CO2", "GLUCOSE", "BUN", "CREATININE", "CALCIUM" in the last 72 hours. PT/INR No results for input(s): "LABPROT", "INR" in the last 72 hours. ABG No results for input(s): "PHART", "HCO3" in the last 72 hours.  Invalid input(s): "PCO2", "PO2" UA reviewed.  Studies/Results: No results found. CT ABDOMEN PELVIS W CONTRAST  Result Date: 01/04/2023 CLINICAL DATA:  Generalized abdominal pain. Hematuria of for months. EXAM: CT  ABDOMEN AND PELVIS WITH CONTRAST TECHNIQUE: Multidetector CT imaging of the abdomen and pelvis was performed using the standard protocol following bolus administration of intravenous contrast. RADIATION DOSE REDUCTION: This exam was performed according to  the departmental dose-optimization program which includes automated exposure control, adjustment of the mA and/or kV according to patient size and/or use of iterative reconstruction technique. CONTRAST:  OMNIPAQUE IOHEXOL 300 MG/ML  SOLN COMPARISON:  CT abdomen and pelvis 06/08/2019 FINDINGS: Lower chest: Normal heart size. Subpleural ground-glass and consolidative opacities within the lower lobes bilaterally. No pleural effusion. Hepatobiliary: Liver is normal in size and contour. No focal hepatic lesion. Gallbladder is unremarkable. Pancreas: Unremarkable Spleen: Unremarkable Adrenals/Urinary Tract: Adrenal glands are normal. Kidneys enhance symmetrically with contrast. No hydronephrosis. Urinary bladder is unremarkable. Stomach/Bowel: Stool throughout the colon. Nonobstructive bowel gas pattern. No abnormal bowel wall thickening. Normal morphology of the stomach. No free fluid or free intraperitoneal air. Vascular/Lymphatic: Normal caliber abdominal aorta. Peripheral calcified atherosclerotic plaque. No retroperitoneal lymphadenopathy. Reproductive: Uterus and adnexal structures are unremarkable. Other: None. Musculoskeletal: Leftward curvature lumbar spine. Multilevel degenerative changes. IMPRESSION: 1. Subpleural ground-glass and consolidative opacities within the lower lobes bilaterally, nonspecific,, likely atelectasis. Underlying infectious process not excluded. 2. Stool throughout the colon as can be seen with constipation. Electronically Signed   By: Annia Belt M.D.   On: 01/04/2023 07:25     Assessment/Plan: UUI.   I will try her on Gemtesa and gave her samples. She will return in a month for a PVR.  Recurrent UTI's.  She is clear on  doxycycline.  UA at f/u.  Microhematuria.  CT was ok.  Cysto last year was ok.  No further w/u needed.   No orders of the defined types were placed in this encounter.    Orders Placed This Encounter  Procedures   Microscopic Examination   Urinalysis, Routine w reflex microscopic   Bladder scan   Bladder scan    Standing Status:   Future    Standing Expiration Date:   04/01/2023   BLADDER SCAN AMB NON-IMAGING     Return in about 4 weeks (around 02/27/2023) for with Huntley Dec for a PVR and UA.  .    CC: Rica Records NP.     Bjorn Pippin 01/31/2023

## 2023-02-05 ENCOUNTER — Ambulatory Visit (INDEPENDENT_AMBULATORY_CARE_PROVIDER_SITE_OTHER): Payer: Medicare HMO | Admitting: Internal Medicine

## 2023-02-05 ENCOUNTER — Encounter: Payer: Self-pay | Admitting: Internal Medicine

## 2023-02-05 VITALS — BP 126/71 | HR 84 | Ht 62.0 in | Wt 100.0 lb

## 2023-02-05 DIAGNOSIS — M25562 Pain in left knee: Secondary | ICD-10-CM

## 2023-02-05 DIAGNOSIS — W57XXXD Bitten or stung by nonvenomous insect and other nonvenomous arthropods, subsequent encounter: Secondary | ICD-10-CM

## 2023-02-05 DIAGNOSIS — S70361D Insect bite (nonvenomous), right thigh, subsequent encounter: Secondary | ICD-10-CM

## 2023-02-05 DIAGNOSIS — R110 Nausea: Secondary | ICD-10-CM | POA: Diagnosis not present

## 2023-02-05 DIAGNOSIS — F5112 Insufficient sleep syndrome: Secondary | ICD-10-CM | POA: Diagnosis not present

## 2023-02-05 DIAGNOSIS — G8929 Other chronic pain: Secondary | ICD-10-CM | POA: Diagnosis not present

## 2023-02-05 DIAGNOSIS — M25561 Pain in right knee: Secondary | ICD-10-CM

## 2023-02-05 NOTE — Assessment & Plan Note (Signed)
I do not believe patient symptoms of nausea and not sleeping well are related. Continue Doxycycline prescribed, has 2 days left on prescription. Follow up if having repeat tick bites.  Continue recommendations of wearing light colored long sleeves and pants and using repellent.

## 2023-02-05 NOTE — Assessment & Plan Note (Signed)
Reviewed and recent TSH normal. Recommend patient have a set time to go to sleep. She is not having any obvious side effect to sleeping 6 hours at this time. She will try going to bed later in the afternoon and follow up if she continues to have concerns for amount of sleep she is getting.

## 2023-02-05 NOTE — Assessment & Plan Note (Signed)
Reviewed chronic problem. Patient following with GI. Reassured I do not think this is related to recent tick bite.

## 2023-02-05 NOTE — Assessment & Plan Note (Signed)
Patient reports her knee pain has not been bothering her since having steroid injections on 5/9.

## 2023-02-05 NOTE — Patient Instructions (Signed)
Thank you, Ms.Leory Plowman for allowing [place] to provide your care today.   No additional treatment needed. Continue efforts to avoid tick bites, Follow up if needed.      Thurmon Fair, M.D.

## 2023-02-05 NOTE — Progress Notes (Signed)
   HPI:[name] is a 82 y.o. female who presents for evaluation after tick bites. Patient has had 37 tick bites and prescribed doxycycline. She has had increased nausea and not sleeping as well since having bites. She has history of chronic nausea. She is taking GERD medications as prescribed.  She has ondansetron as needed for nausea. She has not had any additional tick bites. She goes to sleep at 6 PM  or when her dog wants to go to bed. She wakes up at midnight. No naps during the day. She is not having increase fatigue. 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 . For the details of today's visit, please refer to the assessment and plan.  Physical Exam: Vitals:   02/05/23 0934  BP: 126/71  Pulse: 84  SpO2: 99%  Weight: 100 lb (45.4 kg)  Height: 5\' 2"  (1.575 m)     Physical Exam Constitutional:      General: She is not in acute distress.    Appearance: She is well-groomed.  Cardiovascular:     Rate and Rhythm: Normal rate and regular rhythm.     Heart sounds: No murmur heard. Skin:    Comments: No rash on arms. Reports rash on inner thigh improved.       Assessment & Plan:   Niveen was seen today for tick removal.  Tick bite of right thigh, subsequent encounter Assessment & Plan:  I do not believe patient symptoms of nausea and not sleeping well are related. Continue Doxycycline prescribed, has 2 days left on prescription. Follow up if having repeat tick bites.  Continue recommendations of wearing light colored long sleeves and pants and using repellent.      Bilateral chronic knee pain Assessment & Plan: Patient reports her knee pain has not been bothering her since having steroid injections on 5/9.    Insufficient sleep syndrome Assessment & Plan: Reviewed and recent TSH normal. Recommend patient have a set time to go to sleep. She is not having any obvious side effect to sleeping 6 hours at this time. She will try  going to bed later in the afternoon and follow up if she continues to have concerns for amount of sleep she is getting.    Nausea without vomiting Assessment & Plan: Reviewed chronic problem. Patient following with GI. Reassured I do not think this is related to recent tick bite.        Milus Banister, MD

## 2023-02-11 ENCOUNTER — Ambulatory Visit (INDEPENDENT_AMBULATORY_CARE_PROVIDER_SITE_OTHER): Payer: Medicare HMO | Admitting: Internal Medicine

## 2023-02-11 ENCOUNTER — Encounter: Payer: Self-pay | Admitting: Internal Medicine

## 2023-02-11 VITALS — BP 112/65 | HR 85 | Resp 16 | Ht 62.0 in | Wt 104.0 lb

## 2023-02-11 DIAGNOSIS — K5901 Slow transit constipation: Secondary | ICD-10-CM | POA: Insufficient documentation

## 2023-02-11 DIAGNOSIS — M25561 Pain in right knee: Secondary | ICD-10-CM | POA: Diagnosis not present

## 2023-02-11 DIAGNOSIS — G8929 Other chronic pain: Secondary | ICD-10-CM | POA: Diagnosis not present

## 2023-02-11 DIAGNOSIS — M25562 Pain in left knee: Secondary | ICD-10-CM

## 2023-02-11 MED ORDER — POLYETHYLENE GLYCOL 3350 17 G PO PACK
17.0000 g | PACK | Freq: Every day | ORAL | 0 refills | Status: DC
Start: 2023-02-11 — End: 2024-01-13

## 2023-02-11 NOTE — Progress Notes (Unsigned)
   HPI:[name] is a 82 y.o. female who presents for evaluation of follow-up (Follow up with leg and back pain) and abdominal Pain (Has been hurting some in her mid abdomen and some dysuria. Had UA done on 5/23). Patient is staying active and her back and knee pain has been manageable. She notices her knee pain is worse with carrying bucket of water or carrying her dog Terry Allen ). She takes tylenol and this helps manage pain. Also taking amitriptyline and gabapentin.  She has abdominal pain which is intermittent for a month. She is taking Pepcid for GERD. She is following with urology and on prophylactic Macrobid for recurrent UTI. She can not correlate it to food or time of day. It improves with bowel movements and flatulence. Stool has been hard and has bowel movement every 2 to 3 days.   For the details of today's visit, please refer to the assessment and plan.   Physical Exam: Vitals:   02/11/23 1306  BP: 112/65  Pulse: 85  Resp: 16  SpO2: 96%  Weight: 104 lb (47.2 kg)  Height: 5\' 2"  (1.575 m)     Physical Exam   Assessment & Plan:   There are no diagnoses linked to this encounter.    Milus Banister, MD

## 2023-02-11 NOTE — Assessment & Plan Note (Signed)
Reviewed CT abdomen and pelvis from April showing stool throughout colon. Patient is not taking a bowel regimen. I recommend starting with Miralax once daily. She can increase this to twice daily in a week if bowel movements are not regular. Follow up in 3 months or sooner if needed.

## 2023-02-11 NOTE — Assessment & Plan Note (Addendum)
Continue Tylenol as needed Referral to PT for strengthening exercises

## 2023-02-11 NOTE — Patient Instructions (Addendum)
Thank you, Terry Allen for allowing [place] to provide your care today.   Constipation: Start taking Miralax once daily. Do this for one week. If your are not having regular bowel movements increase to twice daily Follow up in 3 months or sooner if needed.  Back pain and knee pain: Continue current medication regimen. I am going to refer you to physical therapy for strengthening exercises.     Thurmon Fair, M.D.

## 2023-02-20 ENCOUNTER — Ambulatory Visit: Payer: Medicare HMO | Admitting: Urology

## 2023-02-26 DIAGNOSIS — N3281 Overactive bladder: Secondary | ICD-10-CM | POA: Insufficient documentation

## 2023-02-26 DIAGNOSIS — N952 Postmenopausal atrophic vaginitis: Secondary | ICD-10-CM | POA: Insufficient documentation

## 2023-02-26 DIAGNOSIS — N8111 Cystocele, midline: Secondary | ICD-10-CM | POA: Insufficient documentation

## 2023-02-26 DIAGNOSIS — R3915 Urgency of urination: Secondary | ICD-10-CM | POA: Insufficient documentation

## 2023-02-26 DIAGNOSIS — R350 Frequency of micturition: Secondary | ICD-10-CM | POA: Insufficient documentation

## 2023-02-26 NOTE — Progress Notes (Deleted)
History of Present Illness: Terry Allen is a 82 y.o. female who presents today for follow up visit at [facility] [place]. - GU history: 1. Recurrent UTI. - 12/11/2021: Cystoscopic evaluation by Dr. Pete Glatter showed "squamous metaplasia, chronic cystitis" with no other abnormal findings noted. - Previously took daily low dose Nitrofurantoin for UTI prophylaxis. - She uses ***topical vaginal estrogen cream (Estrace) and a ***daily probiotic.  ***not on med list? 2. OAB with urinary frequency, nocturia, urgency, and urge incontinence. - Previously took Myrbetriq 50 mg daily. 3. Microscopic hematuria. - 12/31/2022: CT abdomen/pelvis w/ contrast showed no stones, masses, or hydronephrosis.  4. Stress urinary incontinence.  - 2009: Pinnacle bladder sling and transobturator urethral sling implanted by Dr. Henderson Cloud.   - August 2011: Treated for vaginal mesh extrusion by Dr. Mia Creek.   - 12/11/2021: No evidence of mesh extrusion into vagina or bladder per Dr. Laverle Hobby pelvic exam and cystoscopic evaluation. 5. Grade 2-3 cystocele. Per Dr. Laverle Hobby exam on 12/11/2021. 6. Vaginal atrophy. - She uses topical vaginal estrogen cream (Estrace) ***x/week.  ***not on med list?  At last visit with Dr. Annabell Howells on 01/30/2023: > PVR was 0 ml. > The plan was: - will try her on Gemtesa and gave her samples. She will return in a month for a PVR. - Recurrent UTI's.  She is clear on doxycycline.  UA at f/u. - Microhematuria.  CT was ok.  Cysto last year was ok.  No further w/u needed.   Since last visit: ***  Today: She reports ***  She reports *** urinary stream. She {Actions; denies-reports:120008} urgency. She {Actions; denies-reports:120008} ***increased urinary frequency (*** times per day and *** times at night). She {Actions; denies-reports:120008} dysuria.  She {Actions; denies-reports:120008} gross hematuria. She {Actions; denies-reports:120008} the need to strain to void.  She  {Actions; denies-reports:120008} sensations of incomplete emptying.  She {Actions; denies-reports:120008} urge incontinence. She {Actions; denies-reports:120008} stress incontinence with ***cough/***laugh/***sneeze/***heavy lifting /***exercise. She reports the ***SUI / ***UUI is predominant.  She leaks *** times per ***. Wears *** ***pads/ ***diapers per day. She reports urinary incontinence {ACTION; IS/IS [id number]} significantly bothersome.   She {Actions; denies-reports:120008} flank pain. She {Actions; denies-reports:120008} abdominal pain. She {Actions; denies-reports:120008} fevers. She {Actions; denies-reports:120008} nausea/ vomiting.  She {HAS HAS JWJ:19147} been using vaginal estrogen cream at a frequency of *** time(s) per week.  She {Actions; denies-reports:120008} vaginal pain, bleeding, discharge.   Fall Screening: Do you usually have a device to assist in your mobility? {yes/no:20286} ***cane / ***walker / ***wheelchair   Medications: Current Outpatient Medications  Medication Sig Dispense Refill   acetaminophen (TYLENOL) 325 MG tablet Take 2 tablets (650 mg total) by mouth every 6 (six) hours as needed for mild pain (or Fever >/= 101).     albuterol (VENTOLIN HFA) 108 (90 Base) MCG/ACT inhaler Inhale 2 puffs into the lungs every 4 (four) hours as needed for wheezing or shortness of breath. 1 each 3   amitriptyline (ELAVIL) 50 MG tablet Take 50 mg by mouth at bedtime.   2   atorvastatin (LIPITOR) 40 MG tablet Take 40 mg by mouth at bedtime.      Calcium Carb-Cholecalciferol (CALCIUM 600 + D PO) Take 1 tablet by mouth 2 (two) times a day.     famotidine (PEPCID) 20 MG tablet Take 20 mg by mouth 2 (two) times daily.     gabapentin (NEURONTIN) 300 MG capsule Take 1 capsule (300 mg total) by mouth 3 (three) times daily.  levothyroxine (SYNTHROID) 50 MCG tablet Take 50 mcg by mouth every other day.     levothyroxine (SYNTHROID) 75 MCG tablet Take 75 mcg by mouth every 3  (three) days. Alternating with 50 mcg.     ondansetron (ZOFRAN-ODT) 4 MG disintegrating tablet Take 1 tablet (4 mg total) by mouth every 8 (eight) hours as needed for nausea or vomiting. 60 tablet 2   polyethylene glycol (MIRALAX) 17 g packet Take 17 g by mouth daily. 100 each 0   potassium chloride SA (KLOR-CON M) 20 MEQ tablet Take 1 tablet (20 mEq total) by mouth 2 (two) times daily. 4 tablet 0   No current facility-administered medications for this visit.    Allergies: No Known Allergies  Past Medical History:  Diagnosis Date   Acid reflux    Patient reports Barretts   Bulging lumbar disc    High cholesterol    Hypothyroidism    Nerve damage    Osteopenia    Pelvic pain in female    due to bladder sling   Past Surgical History:  Procedure Laterality Date   APPENDECTOMY  1960   BIOPSY  11/03/2019   Procedure: BIOPSY;  Surgeon: Malissa Hippo, MD;  [facility];  Service: Endoscopy;;   BLADDER SUSPENSION     CATARACT EXTRACTION W/PHACO Right 06/07/2019   Procedure: CATARACT EXTRACTION PHACO AND INTRAOCULAR LENS PLACEMENT (IOC);  Surgeon: Fabio Pierce, MD;  Location: [facility];  Service: Ophthalmology;  Laterality: Right;  CDE: 10.87   CATARACT EXTRACTION W/PHACO Left 06/21/2019   Procedure: CATARACT EXTRACTION PHACO AND INTRAOCULAR LENS PLACEMENT (IOC);  Surgeon: Fabio Pierce, MD;  Location: [facility];  Service: Ophthalmology;  Laterality: Left;  CDE: 11.59   COLONOSCOPY N/A 11/03/2019   Procedure: COLONOSCOPY;  Surgeon: Malissa Hippo, MD;  [facility];  Service: Endoscopy;  Laterality: N/A;  1245   ESOPHAGEAL DILATION N/A 05/27/2018   Procedure: ESOPHAGEAL DILATION;  Surgeon: Malissa Hippo, MD;  [facility];  Service: Endoscopy;  Laterality: N/A;   ESOPHAGEAL DILATION N/A 02/12/2019   Procedure: ESOPHAGEAL DILATION;  Surgeon: Malissa Hippo, MD;  [facility];  Service: Endoscopy;  Laterality: N/A;   ESOPHAGEAL DILATION N/A  02/14/2022   Procedure: ESOPHAGEAL DILATION;  Surgeon: Malissa Hippo, MD;  [facility];  Service: Endoscopy;  Laterality: N/A;   ESOPHAGEAL DILATION N/A 02/27/2022   Procedure: ESOPHAGEAL DILATION;  Surgeon: Dolores Frame, MD;  [facility];  Service: Gastroenterology;  Laterality: N/A;   ESOPHAGOGASTRODUODENOSCOPY N/A 12/21/2014   Procedure: ESOPHAGOGASTRODUODENOSCOPY (EGD);  Surgeon: Malissa Hippo, MD;  [facility];  Service: Endoscopy;  Laterality: N/A;  210   ESOPHAGOGASTRODUODENOSCOPY N/A 06/16/2015   Procedure: ESOPHAGOGASTRODUODENOSCOPY (EGD);  Surgeon: Malissa Hippo, MD;  [facility];  Service: Endoscopy;  Laterality: N/A;  1250   ESOPHAGOGASTRODUODENOSCOPY N/A 05/27/2018   Procedure: ESOPHAGOGASTRODUODENOSCOPY (EGD);  Surgeon: Malissa Hippo, MD;  [facility];  Service: Endoscopy;  Laterality: N/A;  7:30   ESOPHAGOGASTRODUODENOSCOPY N/A 02/12/2019   Procedure: ESOPHAGOGASTRODUODENOSCOPY (EGD);  Surgeon: Malissa Hippo, MD;  [facility];  Service: Endoscopy;  Laterality: N/A;   ESOPHAGOGASTRODUODENOSCOPY (EGD) WITH PROPOFOL N/A 02/14/2022   Procedure: ESOPHAGOGASTRODUODENOSCOPY (EGD) WITH PROPOFOL;  Surgeon: Malissa Hippo, MD;  [facility];  Service: Endoscopy;  Laterality: N/A;  920   ESOPHAGOGASTRODUODENOSCOPY (EGD) WITH PROPOFOL N/A 02/27/2022   Procedure: ESOPHAGOGASTRODUODENOSCOPY (EGD) WITH PROPOFOL;  Surgeon:  Dolores Frame, MD;  [facility];  Service: Gastroenterology;  Laterality: N/A;  1:15 Per Dr Levon Hedger asa 1   Family History  Problem Relation Age of Onset   Breast cancer Mother    Heart disease Mother    Diabetes Mother    Cancer Mother        breast   Stroke Mother    Hypertension Mother    Lung cancer Father    Stroke Father    Cancer Father        oat cell cancer/lung   Kidney cancer Brother    Cancer Brother        renal cell cancer    Healthy Brother    Diabetes Son    Social History   Socioeconomic History   Marital status: Divorced    Spouse name: Not on file   Number of children: Not on file   Years of education: Not on file   Highest education level: Not on file  Occupational History   Not on file  Tobacco Use   Smoking status: Never    Passive exposure: Never   Smokeless tobacco: Never  Vaping Use   Vaping Use: Never used  Substance and Sexual Activity   Alcohol use: No    Alcohol/week: 0.0 standard drinks of alcohol   Drug use: No   Sexual activity: Never  Other Topics Concern   Not on file  Social History Narrative   Not on file   Social Determinants of Health   Financial Resource Strain: Not on file  Food Insecurity: Not on file  Transportation Needs: Not on file  Physical Activity: Not on file  Stress: Not on file  Social Connections: Not on file  Intimate Partner Violence: Not on file    Review of Systems Constitutional: Patient ***denies any unintentional weight loss or change in strength lntegumentary: Patient ***denies any rashes or pruritus Eyes: Patient denies ***dry eyes ENT: Patient ***denies dry mouth Cardiovascular: Patient ***denies chest pain or syncope Respiratory: Patient ***denies shortness of breath Gastrointestinal: Patient ***denies nausea, vomiting, constipation, or diarrhea Musculoskeletal: Patient ***denies muscle cramps or weakness Neurologic: Patient ***denies convulsions or seizures Psychiatric: Patient ***denies memory problems Allergic/Immunologic: Patient ***denies recent allergic reaction(s) Hematologic/Lymphatic: Patient denies bleeding tendencies Endocrine: Patient ***denies heat/cold intolerance  GU: As per HPI.  OBJECTIVE There were no vitals filed for this visit. There is no height or weight on file to calculate BMI.  Physical Examination  Constitutional: ***No obvious distress; patient is ***non-toxic appearing  Cardiovascular: ***No visible  lower extremity edema.  Respiratory: The patient does ***not have audible wheezing/stridor; respirations do ***not appear labored  Gastrointestinal: Abdomen ***non-distended Musculoskeletal: ***Normal ROM of UEs  Skin: ***No obvious rashes/open sores  Neurologic: CN 2-12 grossly ***intact Psychiatric: Answered questions ***appropriately with ***normal affect  Hematologic/Lymphatic/Immunologic: ***No obvious bruises or sites of spontaneous bleeding  UA: {Desc; negative/positive:13464} *** WBC/hpf, *** RBC/hpf, bacteria (***) *** nitrites, *** leukocytes, *** blood PVR: *** ml  ASSESSMENT No diagnosis found. ***  Will plan for follow up in ***months / ***1 year or sooner if needed. Pt verbalized understanding and agreement. All questions were answered.  PLAN Advised the following: 1. *** 2. ***No follow-ups on file.  No orders of the defined types were placed in this encounter.   It has been explained that the patient is to follow regularly with their PCP in addition to all other providers involved in their care and to follow instructions provided by these respective offices.  Patient advised to contact urology clinic if any urologic-pertaining questions, concerns, new symptoms or problems arise in the interim period.  There are no Patient Instructions on file for this visit.  Electronically signed by:  Donnita Falls, FNP   02/26/23    10:41 AM

## 2023-02-26 NOTE — Progress Notes (Signed)
History of Present Illness: Terry Allen is a 82 y.o. female who presents today for follow up visit at [facility] [place]. - GU history: 1. Recurrent UTI. - 12/11/2021: Cystoscopic evaluation by Dr. Pete Glatter showed "squamous metaplasia, chronic cystitis" with no other abnormal findings noted. - Previously took daily low dose Nitrofurantoin for UTI prophylaxis. 2. OAB with urinary frequency, nocturia, urgency, and urge incontinence. - Previously took Myrbetriq 50 mg daily. 3. Microscopic hematuria. - 12/31/2022: CT abdomen/pelvis w/ contrast showed no stones, masses, or hydronephrosis.  4. Stress urinary incontinence.  - 2009: Pinnacle bladder sling and transobturator urethral sling implanted by Dr. Henderson Cloud.   - August 2011: Treated for vaginal mesh extrusion by Dr. Mia Creek.   - 12/11/2021: No evidence of mesh extrusion into vagina or bladder per Dr. Laverle Hobby pelvic exam and cystoscopic evaluation. 5. Grade 2-3 cystocele. Per Dr. Laverle Hobby exam on 12/11/2021. 6. Vaginal atrophy. - Previously used topical vaginal estrogen cream (Estrace).  At last visit with Dr. Annabell Howells on 01/30/2023: > PVR was 0 ml. > The plan was: - will try her on Gemtesa and gave her samples. She will return in a month for a PVR. - Recurrent UTI's.  She is clear on doxycycline.  UA at f/u. - Microhematuria.  CT was ok.  Cysto last year was ok.  No further w/u needed.   Today: She reports that her symptoms have improved since starting Gemtesa. Reports decreased urinary urgency and frequency. Denies urge incontinence.   She denies dysuria, gross hematuria, straining to void, or sensations of incomplete emptying.  She denies vaginal pain, bleeding, discharge.   Fall Screening: Do you usually have a device to assist in your mobility? No   Medications: Current Outpatient Medications  Medication Sig Dispense Refill   acetaminophen (TYLENOL) 325 MG tablet Take 2 tablets (650 mg total) by mouth every  6 (six) hours as needed for mild pain (or Fever >/= 101).     albuterol (VENTOLIN HFA) 108 (90 Base) MCG/ACT inhaler Inhale 2 puffs into the lungs every 4 (four) hours as needed for wheezing or shortness of breath. 1 each 3   amitriptyline (ELAVIL) 50 MG tablet Take 50 mg by mouth at bedtime.   2   atorvastatin (LIPITOR) 40 MG tablet Take 40 mg by mouth at bedtime.      Calcium Carb-Cholecalciferol (CALCIUM 600 + D PO) Take 1 tablet by mouth 2 (two) times a day.     estradiol (ESTRACE) 0.1 MG/GM vaginal cream Discard plastic applicator. Insert a blueberry size amount (approximately 1 gram) of cream on fingertip inside vagina at bedtime every night for 1 week then 2 nights per week for long term use. 30 g 3   famotidine (PEPCID) 20 MG tablet Take 20 mg by mouth 2 (two) times daily.     gabapentin (NEURONTIN) 300 MG capsule Take 1 capsule (300 mg total) by mouth 3 (three) times daily.     levothyroxine (SYNTHROID) 50 MCG tablet Take 50 mcg by mouth every other day.     levothyroxine (SYNTHROID) 75 MCG tablet Take 75 mcg by mouth every 3 (three) days. Alternating with 50 mcg.     ondansetron (ZOFRAN-ODT) 4 MG disintegrating tablet Take 1 tablet (4 mg total) by mouth every 8 (eight) hours as needed for nausea or vomiting. 60 tablet 2   polyethylene glycol (MIRALAX) 17 g packet Take 17 g by mouth daily. 100 each 0   potassium chloride SA (KLOR-CON M) 20 MEQ tablet Take 1  tablet (20 mEq total) by mouth 2 (two) times daily. 4 tablet 0   Vibegron (GEMTESA) 75 MG TABS Take 1 tablet (75 mg total) by mouth daily. 30 tablet 11   No current facility-administered medications for this visit.    Allergies: No Known Allergies  Past Medical History:  Diagnosis Date   Acid reflux    Patient reports Barretts   Bulging lumbar disc    High cholesterol    Hypothyroidism    Nerve damage    Osteopenia    Pelvic pain in female    due to bladder sling   Past Surgical History:  Procedure Laterality Date    APPENDECTOMY  1960   BIOPSY  11/03/2019   Procedure: BIOPSY;  Surgeon: Malissa Hippo, MD;  [facility];  Service: Endoscopy;;   BLADDER SUSPENSION     CATARACT EXTRACTION W/PHACO Right 06/07/2019   Procedure: CATARACT EXTRACTION PHACO AND INTRAOCULAR LENS PLACEMENT (IOC);  Surgeon: Fabio Pierce, MD;  Location: [facility];  Service: Ophthalmology;  Laterality: Right;  CDE: 10.87   CATARACT EXTRACTION W/PHACO Left 06/21/2019   Procedure: CATARACT EXTRACTION PHACO AND INTRAOCULAR LENS PLACEMENT (IOC);  Surgeon: Fabio Pierce, MD;  [facility]: [facility];  Service: Ophthalmology;  Laterality: Left;  CDE: 11.59   COLONOSCOPY N/A 11/03/2019   Procedure: COLONOSCOPY;  Surgeon: Malissa Hippo, MD;  [facility];  Service: Endoscopy;  Laterality: N/A;  1245   ESOPHAGEAL DILATION N/A 05/27/2018   Procedure: ESOPHAGEAL DILATION;  Surgeon: Malissa Hippo, MD;  [facility];  Service: Endoscopy;  Laterality: N/A;   ESOPHAGEAL DILATION N/A 02/12/2019   Procedure: ESOPHAGEAL DILATION;  Surgeon: Malissa Hippo, MD;  [facility];  Service: Endoscopy;  Laterality: N/A;   ESOPHAGEAL DILATION N/A 02/14/2022   Procedure: ESOPHAGEAL DILATION;  Surgeon: Malissa Hippo, MD;  [facility];  Service: Endoscopy;  Laterality: N/A;   ESOPHAGEAL DILATION N/A 02/27/2022   Procedure: ESOPHAGEAL DILATION;  Surgeon: Dolores Frame, MD;  [facility];  Service: Gastroenterology;  Laterality: N/A;   ESOPHAGOGASTRODUODENOSCOPY N/A 12/21/2014   Procedure: ESOPHAGOGASTRODUODENOSCOPY (EGD);  Surgeon: Malissa Hippo, MD;  [facility];  Service: Endoscopy;  Laterality: N/A;  210   ESOPHAGOGASTRODUODENOSCOPY N/A 06/16/2015   Procedure: ESOPHAGOGASTRODUODENOSCOPY (EGD);  Surgeon: Malissa Hippo, MD;  [facility];  Service: Endoscopy;  Laterality: N/A;  1250   ESOPHAGOGASTRODUODENOSCOPY N/A 05/27/2018   Procedure: ESOPHAGOGASTRODUODENOSCOPY  (EGD);  Surgeon: Malissa Hippo, MD;  [facility];  Service: Endoscopy;  Laterality: N/A;  7:30   ESOPHAGOGASTRODUODENOSCOPY N/A 02/12/2019   Procedure: ESOPHAGOGASTRODUODENOSCOPY (EGD);  Surgeon: Malissa Hippo, MD;  [facility];  Service: Endoscopy;  Laterality: N/A;   ESOPHAGOGASTRODUODENOSCOPY (EGD) WITH PROPOFOL N/A 02/14/2022   Procedure: ESOPHAGOGASTRODUODENOSCOPY (EGD) WITH PROPOFOL;  Surgeon: Malissa Hippo, MD;  [facility];  Service: Endoscopy;  Laterality: N/A;  920   ESOPHAGOGASTRODUODENOSCOPY (EGD) WITH PROPOFOL N/A 02/27/2022   Procedure: ESOPHAGOGASTRODUODENOSCOPY (EGD) WITH PROPOFOL;  Surgeon: Dolores Frame, MD;  [facility];  Service: Gastroenterology;  Laterality: N/A;  1:15 Per Dr Levon Hedger asa 1   Family History  Problem Relation Age of Onset   Breast cancer Mother    Heart disease Mother    Diabetes Mother    Cancer Mother        breast   Stroke Mother    Hypertension Mother    Lung cancer Father  Stroke Father    Cancer Father        oat cell cancer/lung   Kidney cancer Brother    Cancer Brother        renal cell cancer   Healthy Brother    Diabetes Son    Social History   Socioeconomic History   Marital status: Divorced    Spouse name: Not on file   Number of children: Not on file   Years of education: Not on file   Highest education level: Not on file  Occupational History   Not on file  Tobacco Use   Smoking status: Never    Passive exposure: Never   Smokeless tobacco: Never  Vaping Use   Vaping Use: Never used  Substance and Sexual Activity   Alcohol use: No    Alcohol/week: 0.0 standard drinks of alcohol   Drug use: No   Sexual activity: Never  Other Topics Concern   Not on file  Social History Narrative   Not on file   Social Determinants of Health   Financial Resource Strain: Not on file  Food Insecurity: Not on file  Transportation Needs: Not on file  Physical  Activity: Not on file  Stress: Not on file  Social Connections: Not on file  Intimate Partner Violence: Not on file    Review of Systems Constitutional: Patient denies any unintentional weight loss or change in strength lntegumentary: Patient denies any rashes or pruritus Eyes: Patient denies dry eyes ENT: Patient denies dry mouth Cardiovascular: Patient denies chest pain or syncope Respiratory: Patient denies shortness of breath Gastrointestinal: Patient reports nausea and acid reflux Musculoskeletal: Patient denies muscle cramps or weakness Neurologic: Patient denies convulsions or seizures Psychiatric: Patient denies memory problems Allergic/Immunologic: Patient denies recent allergic reaction(s) Hematologic/Lymphatic: Patient denies bleeding tendencies Endocrine: Patient denies heat/cold intolerance  GU: As per HPI.  OBJECTIVE Vitals:   02/28/23 1119  BP: 118/71  Pulse: 86  Temp: 98.7 F (37.1 C)   There is no height or weight on file to calculate BMI.  Physical Examination  Constitutional: No obvious distress; patient is non-toxic appearing  Cardiovascular: No visible lower extremity edema.  Respiratory: The patient does not have audible wheezing/stridor; respirations do not appear labored  Gastrointestinal: Abdomen non-distended Musculoskeletal: Normal ROM of UEs  Skin: No obvious rashes/open sores  Neurologic: CN 2-12 grossly intact Psychiatric: Answered questions appropriately with normal affect  Hematologic/Lymphatic/Immunologic: No obvious bruises or sites of spontaneous bleeding  UA: negative for 3-10 RBC/hpf PVR: 5 ml  ASSESSMENT OAB (overactive bladder) - Plan: Urinalysis, Routine w reflex microscopic, BLADDER SCAN AMB NON-IMAGING, Vibegron (GEMTESA) 75 MG TABS  Urge incontinence - Plan: Urinalysis, Routine w reflex microscopic, BLADDER SCAN AMB NON-IMAGING, Vibegron (GEMTESA) 75 MG TABS  Urinary frequency - Plan: Urinalysis, Routine w reflex  microscopic, BLADDER SCAN AMB NON-IMAGING, Vibegron (GEMTESA) 75 MG TABS  Urinary urgency - Plan: Urinalysis, Routine w reflex microscopic, BLADDER SCAN AMB NON-IMAGING, Vibegron (GEMTESA) 75 MG TABS  Recurrent UTI - Plan: Urinalysis, Routine w reflex microscopic, BLADDER SCAN AMB NON-IMAGING, estradiol (ESTRACE) 0.1 MG/GM vaginal cream, Urine Culture  Atrophic vaginitis - Plan: Urinalysis, Routine w reflex microscopic, BLADDER SCAN AMB NON-IMAGING, estradiol (ESTRACE) 0.1 MG/GM vaginal cream  She is doing well today.  - OAB symptoms improved with Gemtesa; refills sent.  - For UTI prevention and vaginal atrophy she elected to restart topical vaginal estrogen cream 2-3 nights per week.   Advised follow up with GI for GERD.   Will plan  for follow up in 6 months or sooner if needed. Pt verbalized understanding and agreement. All questions were answered.  PLAN Advised the following: 1. Continue Gemtesa 75 mg daily. 2. Apply topical vaginal estrogen cream 2-3 nights per week (long term). 3. Return in about 6 months (around 08/30/2023) for UA, PVR, & f/u with Evette Georges NP.  Orders Placed This Encounter  Procedures   Urine Culture   Urinalysis, Routine w reflex microscopic   BLADDER SCAN AMB NON-IMAGING    It has been explained that the patient is to follow regularly with their PCP in addition to all other providers involved in their care and to follow instructions provided by these respective offices. Patient advised to contact urology clinic if any urologic-pertaining questions, concerns, new symptoms or problems arise in the interim period.  There are no Patient Instructions on file for this visit.  Electronically signed by:  Donnita Falls, FNP   02/28/23    12:57 PM

## 2023-02-27 ENCOUNTER — Ambulatory Visit: Payer: Medicare HMO | Admitting: Urology

## 2023-02-28 ENCOUNTER — Ambulatory Visit: Payer: Medicare HMO | Admitting: Urology

## 2023-02-28 ENCOUNTER — Encounter: Payer: Self-pay | Admitting: Urology

## 2023-02-28 VITALS — BP 118/71 | HR 86 | Temp 98.7°F

## 2023-02-28 DIAGNOSIS — Z87440 Personal history of urinary (tract) infections: Secondary | ICD-10-CM

## 2023-02-28 DIAGNOSIS — R3915 Urgency of urination: Secondary | ICD-10-CM | POA: Diagnosis not present

## 2023-02-28 DIAGNOSIS — N390 Urinary tract infection, site not specified: Secondary | ICD-10-CM

## 2023-02-28 DIAGNOSIS — N3281 Overactive bladder: Secondary | ICD-10-CM

## 2023-02-28 DIAGNOSIS — N8111 Cystocele, midline: Secondary | ICD-10-CM

## 2023-02-28 DIAGNOSIS — R350 Frequency of micturition: Secondary | ICD-10-CM

## 2023-02-28 DIAGNOSIS — N3941 Urge incontinence: Secondary | ICD-10-CM

## 2023-02-28 DIAGNOSIS — N952 Postmenopausal atrophic vaginitis: Secondary | ICD-10-CM | POA: Diagnosis not present

## 2023-02-28 LAB — URINALYSIS, ROUTINE W REFLEX MICROSCOPIC
Bilirubin, UA: NEGATIVE
Glucose, UA: NEGATIVE
Ketones, UA: NEGATIVE
Leukocytes,UA: NEGATIVE
Nitrite, UA: NEGATIVE
Protein,UA: NEGATIVE
Specific Gravity, UA: 1.01 (ref 1.005–1.030)
Urobilinogen, Ur: 0.2 mg/dL (ref 0.2–1.0)
pH, UA: 6 (ref 5.0–7.5)

## 2023-02-28 LAB — MICROSCOPIC EXAMINATION: Bacteria, UA: NONE SEEN

## 2023-02-28 LAB — BLADDER SCAN AMB NON-IMAGING

## 2023-02-28 MED ORDER — ESTRADIOL 0.1 MG/GM VA CREA
TOPICAL_CREAM | VAGINAL | 3 refills | Status: DC
Start: 2023-02-28 — End: 2024-01-13

## 2023-02-28 MED ORDER — GEMTESA 75 MG PO TABS
1.0000 | ORAL_TABLET | Freq: Every day | ORAL | 11 refills | Status: DC
Start: 2023-02-28 — End: 2024-01-13

## 2023-03-02 LAB — URINE CULTURE: Organism ID, Bacteria: NO GROWTH

## 2023-03-03 ENCOUNTER — Telehealth: Payer: Self-pay

## 2023-03-03 NOTE — Progress Notes (Signed)
Please notify patient: Negative urine culture, no antibiotic needed at this time. Thanks. 

## 2023-03-03 NOTE — Telephone Encounter (Signed)
-----   Message from Donnita Falls, FNP sent at 03/03/2023  8:38 AM EDT ----- Please notify patient: Negative urine culture, no antibiotic needed at this time. Thanks.

## 2023-03-03 NOTE — Telephone Encounter (Signed)
Patient is made aware of Maralyn Sago recommendation and voiced understanding.

## 2023-04-28 ENCOUNTER — Encounter: Payer: Self-pay | Admitting: Family Medicine

## 2023-04-28 ENCOUNTER — Ambulatory Visit (INDEPENDENT_AMBULATORY_CARE_PROVIDER_SITE_OTHER): Payer: Medicare HMO | Admitting: Family Medicine

## 2023-04-28 VITALS — BP 114/71 | HR 86 | Ht 62.0 in | Wt 101.0 lb

## 2023-04-28 DIAGNOSIS — R110 Nausea: Secondary | ICD-10-CM | POA: Diagnosis not present

## 2023-04-28 DIAGNOSIS — A084 Viral intestinal infection, unspecified: Secondary | ICD-10-CM | POA: Diagnosis not present

## 2023-04-28 DIAGNOSIS — R1084 Generalized abdominal pain: Secondary | ICD-10-CM | POA: Diagnosis not present

## 2023-04-28 MED ORDER — MECLIZINE HCL 12.5 MG PO TABS: 12.5000 mg | ORAL_TABLET | Freq: Three times a day (TID) | ORAL | 0 refills | Status: DC | PRN

## 2023-04-28 MED ORDER — ACETAMINOPHEN 325 MG PO TABS
650.0000 mg | ORAL_TABLET | Freq: Four times a day (QID) | ORAL | Status: AC | PRN
Start: 2023-04-28 — End: ?

## 2023-04-28 NOTE — Progress Notes (Signed)
Patient Office Visit   Subjective   Patient ID: Terry Allen, female    DOB: 27-Aug-1941  Age: 82 y.o. MRN: [id number]  CC:  Chief Complaint  Patient presents with   Nausea    Nausea stomach pain since Friday     HPI Terry Allen 82 year old female,presents to the clinic for abdominal pain and nausea since Friday. She  has a past medical history of Acid reflux, Bulging lumbar disc, High cholesterol, Hypothyroidism, Nerve damage, Osteopenia, and Pelvic pain in female.  Abdominal Pain This is a new problem. Abdominal pain occurs intermittently and has been gradually worsening. The pain is located in the generalized abdominal region. The pain is at a severity of 5/10. The quality of the pain is aching. The abdominal pain does not radiate. Associated symptoms include nausea. Pertinent negatives include no belching, constipation, diarrhea, fever, hematochezia, hematuria, melena or vomiting. The pain is aggravated by certain positions, movement and palpation. The pain is relieved by Liquids. She has tried acetaminophen for the symptoms. The treatment provided moderate relief. Her past medical history is significant for GERD.        Outpatient Encounter Medications as of 04/28/2023  Medication Sig   albuterol (VENTOLIN HFA) 108 (90 Base) MCG/ACT inhaler Inhale 2 puffs into the lungs every 4 (four) hours as needed for wheezing or shortness of breath.   amitriptyline (ELAVIL) 50 MG tablet Take 50 mg by mouth at bedtime.    atorvastatin (LIPITOR) 40 MG tablet Take 40 mg by mouth at bedtime.    Calcium Carb-Cholecalciferol (CALCIUM 600 + D PO) Take 1 tablet by mouth 2 (two) times a day.   estradiol (ESTRACE) 0.1 MG/GM vaginal cream Discard plastic applicator. Insert a blueberry size amount (approximately 1 gram) of cream on fingertip inside vagina at bedtime every night for 1 week then 2 nights per week for long term use.   famotidine (PEPCID) 20 MG tablet Take 20 mg by mouth 2  (two) times daily.   gabapentin (NEURONTIN) 300 MG capsule Take 1 capsule (300 mg total) by mouth 3 (three) times daily.   levothyroxine (SYNTHROID) 50 MCG tablet Take 50 mcg by mouth every other day.   levothyroxine (SYNTHROID) 75 MCG tablet Take 75 mcg by mouth every 3 (three) days. Alternating with 50 mcg.   meclizine (ANTIVERT) 12.5 MG tablet Take 1 tablet (12.5 mg total) by mouth 3 (three) times daily as needed for dizziness.   ondansetron (ZOFRAN-ODT) 4 MG disintegrating tablet Take 1 tablet (4 mg total) by mouth every 8 (eight) hours as needed for nausea or vomiting.   polyethylene glycol (MIRALAX) 17 g packet Take 17 g by mouth daily.   potassium chloride SA (KLOR-CON M) 20 MEQ tablet Take 1 tablet (20 mEq total) by mouth 2 (two) times daily.   Vibegron (GEMTESA) 75 MG TABS Take 1 tablet (75 mg total) by mouth daily.   [DISCONTINUED] acetaminophen (TYLENOL) 325 MG tablet Take 2 tablets (650 mg total) by mouth every 6 (six) hours as needed for mild pain (or Fever >/= 101).   acetaminophen (TYLENOL) 325 MG tablet Take 2 tablets (650 mg total) by mouth every 6 (six) hours as needed for mild pain (or Fever >/= 101).   No facility-administered encounter medications on file as of 04/28/2023.    Past Surgical History:  Procedure Laterality Date   APPENDECTOMY  1960   BIOPSY  11/03/2019   Procedure: BIOPSY;  Surgeon: Malissa Hippo, MD;  [facility];  Service: Endoscopy;;   BLADDER SUSPENSION     CATARACT EXTRACTION W/PHACO Right 06/07/2019   Procedure: CATARACT EXTRACTION PHACO AND INTRAOCULAR LENS PLACEMENT (IOC);  Surgeon: Fabio Pierce, MD;  Location: [facility];  Service: Ophthalmology;  Laterality: Right;  CDE: 10.87   CATARACT EXTRACTION W/PHACO Left 06/21/2019   Procedure: CATARACT EXTRACTION PHACO AND INTRAOCULAR LENS PLACEMENT (IOC);  Surgeon: Fabio Pierce, MD;  Location: [facility];  Service: Ophthalmology;  Laterality: Left;  CDE: 11.59   COLONOSCOPY N/A 11/03/2019    Procedure: COLONOSCOPY;  Surgeon: Malissa Hippo, MD;  [facility];  Service: Endoscopy;  Laterality: N/A;  1245   ESOPHAGEAL DILATION N/A 05/27/2018   Procedure: ESOPHAGEAL DILATION;  Surgeon: Malissa Hippo, MD;  [facility];  Service: Endoscopy;  Laterality: N/A;   ESOPHAGEAL DILATION N/A 02/12/2019   Procedure: ESOPHAGEAL DILATION;  Surgeon: Malissa Hippo, MD;  [facility];  Service: Endoscopy;  Laterality: N/A;   ESOPHAGEAL DILATION N/A 02/14/2022   Procedure: ESOPHAGEAL DILATION;  Surgeon: Malissa Hippo, MD;  [facility];  Service: Endoscopy;  Laterality: N/A;   ESOPHAGEAL DILATION N/A 02/27/2022   Procedure: ESOPHAGEAL DILATION;  Surgeon: Dolores Frame, MD;  [facility];  Service: Gastroenterology;  Laterality: N/A;   ESOPHAGOGASTRODUODENOSCOPY N/A 12/21/2014   Procedure: ESOPHAGOGASTRODUODENOSCOPY (EGD);  Surgeon: Malissa Hippo, MD;  [facility];  Service: Endoscopy;  Laterality: N/A;  210   ESOPHAGOGASTRODUODENOSCOPY N/A 06/16/2015   Procedure: ESOPHAGOGASTRODUODENOSCOPY (EGD);  Surgeon: Malissa Hippo, MD;  [facility];  Service: Endoscopy;  Laterality: N/A;  1250   ESOPHAGOGASTRODUODENOSCOPY N/A 05/27/2018   Procedure: ESOPHAGOGASTRODUODENOSCOPY (EGD);  Surgeon: Malissa Hippo, MD;  [facility];  Service: Endoscopy;  Laterality: N/A;  7:30   ESOPHAGOGASTRODUODENOSCOPY N/A 02/12/2019   Procedure: ESOPHAGOGASTRODUODENOSCOPY (EGD);  Surgeon: Malissa Hippo, MD;  [facility];  Service: Endoscopy;  Laterality: N/A;   ESOPHAGOGASTRODUODENOSCOPY (EGD) WITH PROPOFOL N/A 02/14/2022   Procedure: ESOPHAGOGASTRODUODENOSCOPY (EGD) WITH PROPOFOL;  Surgeon: Malissa Hippo, MD;  [facility];  Service: Endoscopy;  Laterality: N/A;  920   ESOPHAGOGASTRODUODENOSCOPY (EGD) WITH PROPOFOL N/A 02/27/2022   Procedure: ESOPHAGOGASTRODUODENOSCOPY (EGD) WITH PROPOFOL;  Surgeon:  Dolores Frame, MD;  [facility];  Service: Gastroenterology;  Laterality: N/A;  1:15 Per Dr Levon Hedger asa 1    Review of Systems  Constitutional:  Positive for malaise/fatigue. Negative for chills and fever.  Respiratory:  Negative for shortness of breath.   Cardiovascular:  Negative for chest pain.  Gastrointestinal:  Positive for abdominal pain, heartburn and nausea. Negative for blood in stool, constipation, diarrhea, melena and vomiting.  Genitourinary:  Negative for dysuria.  Neurological:  Negative for dizziness and headaches.      Objective    BP 114/71 (BP Location: Left Arm, Patient Position: Sitting, Cuff Size: Normal)   Pulse 86   Ht 5\' 2"  (1.575 m)   Wt 101 lb 0.6 oz (45.8 kg)   SpO2 97%   BMI 18.48 kg/m   Physical Exam Vitals reviewed.  Constitutional:      General: She is not in acute distress.    Appearance: Normal appearance. She is not ill-appearing, toxic-appearing or diaphoretic.  HENT:     Head: Normocephalic.  Eyes:     General:        Right eye: No discharge.        Left eye: No discharge.  Conjunctiva/sclera: Conjunctivae normal.  Cardiovascular:     Rate and Rhythm: Normal rate.     Pulses: Normal pulses.     Heart sounds: Normal heart sounds.  Pulmonary:     Effort: Pulmonary effort is normal. No respiratory distress.     Breath sounds: Normal breath sounds.  Abdominal:     General: Bowel sounds are normal.     Palpations: Abdomen is soft.     Tenderness: There is abdominal tenderness. There is no right CVA tenderness, left CVA tenderness or guarding.  Musculoskeletal:        General: Normal range of motion.     Cervical back: Normal range of motion.  Skin:    General: Skin is warm and dry.     Capillary Refill: Capillary refill takes less than 2 seconds.  Neurological:     General: No focal deficit present.     Mental Status: She is alert and oriented to person, place, and time.     Coordination: Coordination  normal.     Gait: Gait normal.  Psychiatric:        Mood and Affect: Mood normal.        Behavior: Behavior normal.       Assessment & Plan:  Viral gastroenteritis Assessment & Plan: GI Profile stool ordered. Antivert 12.5 mg PRN  for Nausea  Advice to Increase oral fluid intake. Bland diet as tolerated. Avoid fluids that have a lot sugar or caffeine, Avoid spicy or fatty food. Avoid alcohol. Can take OTC tylenol for pain. Follow-up in unable to keep food/fluid down x 24 hours, dizziness, fevers, worsening or persistent symptoms to present to ED or contact primary care provider. Patient verbalizes understanding regarding plan of care and all questions answered.  Follow up wit GI for worsening symptoms   Orders: -     Acetaminophen; Take 2 tablets (650 mg total) by mouth every 6 (six) hours as needed for mild pain (or Fever >/= 101).  Generalized abdominal pain -     GI Profile, Stool, PCR  Nausea  Other orders -     Meclizine HCl; Take 1 tablet (12.5 mg total) by mouth 3 (three) times daily as needed for dizziness.  Dispense: 30 tablet; Refill: 0    Return if symptoms worsen or fail to improve.   Cruzita Lederer Newman Nip, FNP

## 2023-04-28 NOTE — Patient Instructions (Signed)

## 2023-04-28 NOTE — Assessment & Plan Note (Addendum)
GI Profile stool ordered. Antivert 12.5 mg PRN  for Nausea  Advice to Increase oral fluid intake. Bland diet as tolerated. Avoid fluids that have a lot sugar or caffeine, Avoid spicy or fatty food. Avoid alcohol. Can take OTC tylenol for pain. Follow-up in unable to keep food/fluid down x 24 hours, dizziness, fevers, worsening or persistent symptoms to present to ED or contact primary care provider. Patient verbalizes understanding regarding plan of care and all questions answered.  Follow up wit GI for worsening symptoms

## 2023-04-29 ENCOUNTER — Ambulatory Visit: Payer: Medicare HMO | Admitting: Gastroenterology

## 2023-04-29 ENCOUNTER — Encounter: Payer: Self-pay | Admitting: Gastroenterology

## 2023-04-29 VITALS — BP 114/71 | HR 89 | Temp 98.1°F | Ht 62.0 in | Wt 101.8 lb

## 2023-04-29 DIAGNOSIS — R1319 Other dysphagia: Secondary | ICD-10-CM | POA: Diagnosis not present

## 2023-04-29 DIAGNOSIS — K2901 Acute gastritis with bleeding: Secondary | ICD-10-CM | POA: Diagnosis not present

## 2023-04-29 DIAGNOSIS — A084 Viral intestinal infection, unspecified: Secondary | ICD-10-CM | POA: Diagnosis not present

## 2023-04-29 DIAGNOSIS — Q394 Esophageal web: Secondary | ICD-10-CM | POA: Diagnosis not present

## 2023-04-29 DIAGNOSIS — R1084 Generalized abdominal pain: Secondary | ICD-10-CM | POA: Diagnosis not present

## 2023-04-29 DIAGNOSIS — K2270 Barrett's esophagus without dysplasia: Secondary | ICD-10-CM | POA: Diagnosis not present

## 2023-04-29 NOTE — Patient Instructions (Signed)
We are arranging an upper endoscopy with dilation by Dr. Levon Hedger in the near future!  Chew well, sit upright while eating, and stick with very soft foods.  We will see you in follow-up thereafter!  It was a pleasure to see you today. I want to create trusting relationships with patients and provide genuine, compassionate, and quality care. I truly value your feedback, so please be on the lookout for a survey regarding your visit with me today. I appreciate your time in completing this!         Gelene Mink, PhD, ANP-BC [facility]

## 2023-04-29 NOTE — Progress Notes (Addendum)
Gastroenterology Office Note     Primary Care Physician:  Rica Records, FNP  Primary Gastroenterologist: Dr. Levon Hedger    Chief Complaint   Chief Complaint  Patient presents with   New Patient (Initial Visit)    Pt states she is here for a cough for about 3 weeks, with nausea some dysphagia. States she seen her PCP yesterday     History of Present Illness   Terry Allen is an 82 y.o. female presenting today with a history of GERD, esophageal web, hyperlipidemia, hypothyroidism, osteopenia, recurrent dysphagia, remote history of Barrett's but biopsies in 2013 and 2016 negative for Barrett's, no changes suggesting Barrett's in 2019/2020, multiple prior dilatations in the past, last seen Nov 2023 by Dr. Levon Hedger.   Her last EGD was actually in June 2023 X 2 separate occasions. She noted improvement historically with dilations.   Recurrent dysphagia and points to neck. Present for about 3-4 weeks or longer. Anything with crust gets hung. Liquids go down ok. Epigastric pain noted. Somewhat difficult historian. No postprandial abdominal pain. Denies typical heart burn. It appears she was taken off a PPI several years ago, as Pepcid seemed to help with her chronic epigastric pain instead.       Last Colonoscopy: 11/03/2019 - One small polyp in the cecum. Biopsied. - Diverticulosis in the sigmoid colon.   Path: A. COLON, CECAL, POLYPECTOMY:  - Sessile serrated polyp/adenoma (s) without cytologic dysplasia.  - No evidence of carcinoma.    Recommended repeat colonoscopy in 5 years if medically fit.  Past Medical History:  Diagnosis Date   Acid reflux    Patient reports Barretts   Bulging lumbar disc    High cholesterol    Hypothyroidism    Nerve damage    Osteopenia    Pelvic pain in female    due to bladder sling    Past Surgical History:  Procedure Laterality Date   APPENDECTOMY  1960   BIOPSY  11/03/2019   Procedure: BIOPSY;  Surgeon: Malissa Hippo, MD;  [facility];  Service: Endoscopy;;   BLADDER SUSPENSION     CATARACT EXTRACTION W/PHACO Right 06/07/2019   Procedure: CATARACT EXTRACTION PHACO AND INTRAOCULAR LENS PLACEMENT (IOC);  Surgeon: Fabio Pierce, MD;  Location: [facility];  Service: Ophthalmology;  Laterality: Right;  CDE: 10.87   CATARACT EXTRACTION W/PHACO Left 06/21/2019   Procedure: CATARACT EXTRACTION PHACO AND INTRAOCULAR LENS PLACEMENT (IOC);  Surgeon: Fabio Pierce, MD;  [facility]: [facility];  Service: Ophthalmology;  Laterality: Left;  CDE: 11.59   COLONOSCOPY N/A 11/03/2019   Procedure: COLONOSCOPY;  Surgeon: Malissa Hippo, MD;  [facility];  Service: Endoscopy;  Laterality: N/A;  1245   ESOPHAGEAL DILATION N/A 05/27/2018   Procedure: ESOPHAGEAL DILATION;  Surgeon: Malissa Hippo, MD;  [facility];  Service: Endoscopy;  Laterality: N/A;   ESOPHAGEAL DILATION N/A 02/12/2019   Procedure: ESOPHAGEAL DILATION;  Surgeon: Malissa Hippo, MD;  [facility];  Service: Endoscopy;  Laterality: N/A;   ESOPHAGEAL DILATION N/A 02/14/2022   Procedure: ESOPHAGEAL DILATION;  Surgeon: Malissa Hippo, MD;  [facility];  Service: Endoscopy;  Laterality: N/A;   ESOPHAGEAL DILATION N/A 02/27/2022   Procedure: ESOPHAGEAL DILATION;  Surgeon: Dolores Frame, MD;  [facility];  Service: Gastroenterology;  Laterality: N/A;   ESOPHAGOGASTRODUODENOSCOPY N/A 12/21/2014   Procedure: ESOPHAGOGASTRODUODENOSCOPY (EGD);  Surgeon: Malissa Hippo, MD;  [facility];  Service: Endoscopy;  Laterality: N/A;  210   ESOPHAGOGASTRODUODENOSCOPY N/A 06/16/2015   Procedure: ESOPHAGOGASTRODUODENOSCOPY (EGD);  Surgeon: Malissa Hippo, MD;  [facility];  Service: Endoscopy;  Laterality: N/A;  1250   ESOPHAGOGASTRODUODENOSCOPY N/A 05/27/2018   Procedure: ESOPHAGOGASTRODUODENOSCOPY (EGD);  Surgeon: Malissa Hippo, MD;  [facility];  Service:  Endoscopy;  Laterality: N/A;  7:30   ESOPHAGOGASTRODUODENOSCOPY N/A 02/12/2019   Procedure: ESOPHAGOGASTRODUODENOSCOPY (EGD);  Surgeon: Malissa Hippo, MD;  [facility];  Service: Endoscopy;  Laterality: N/A;   ESOPHAGOGASTRODUODENOSCOPY (EGD) WITH PROPOFOL N/A 02/14/2022   Procedure: ESOPHAGOGASTRODUODENOSCOPY (EGD) WITH PROPOFOL;  Surgeon: Malissa Hippo, MD;  [facility];  Service: Endoscopy;  Laterality: N/A;  920   ESOPHAGOGASTRODUODENOSCOPY (EGD) WITH PROPOFOL N/A 02/27/2022   Procedure: ESOPHAGOGASTRODUODENOSCOPY (EGD) WITH PROPOFOL;  Surgeon: Dolores Frame, MD;  [facility];  Service: Gastroenterology;  Laterality: N/A;  1:15 Per Dr Levon Hedger asa 1    Current Outpatient Medications  Medication Sig Dispense Refill   acetaminophen (TYLENOL) 325 MG tablet Take 2 tablets (650 mg total) by mouth every 6 (six) hours as needed for mild pain (or Fever >/= 101).     albuterol (VENTOLIN HFA) 108 (90 Base) MCG/ACT inhaler Inhale 2 puffs into the lungs every 4 (four) hours as needed for wheezing or shortness of breath. 1 each 3   amitriptyline (ELAVIL) 50 MG tablet Take 50 mg by mouth at bedtime.   2   atorvastatin (LIPITOR) 40 MG tablet Take 40 mg by mouth at bedtime.      Calcium Carb-Cholecalciferol (CALCIUM 600 + D PO) Take 1 tablet by mouth 2 (two) times a day.     estradiol (ESTRACE) 0.1 MG/GM vaginal cream Discard plastic applicator. Insert a blueberry size amount (approximately 1 gram) of cream on fingertip inside vagina at bedtime every night for 1 week then 2 nights per week for long term use. 30 g 3   famotidine (PEPCID) 20 MG tablet Take 20 mg by mouth 2 (two) times daily.     gabapentin (NEURONTIN) 300 MG capsule Take 1 capsule (300 mg total) by mouth 3 (three) times daily.     levothyroxine (SYNTHROID) 50 MCG tablet Take 50 mcg by mouth every other day.     levothyroxine (SYNTHROID) 75 MCG tablet Take 75 mcg by mouth every 3 (three) days.  Alternating with 50 mcg.     meclizine (ANTIVERT) 12.5 MG tablet Take 1 tablet (12.5 mg total) by mouth 3 (three) times daily as needed for dizziness. 30 tablet 0   ondansetron (ZOFRAN-ODT) 4 MG disintegrating tablet Take 1 tablet (4 mg total) by mouth every 8 (eight) hours as needed for nausea or vomiting. 60 tablet 2   polyethylene glycol (MIRALAX) 17 g packet Take 17 g by mouth daily. 100 each 0   potassium chloride SA (KLOR-CON M) 20 MEQ tablet Take 1 tablet (20 mEq total) by mouth 2 (two) times daily. 4 tablet 0   Vibegron (GEMTESA) 75 MG TABS Take 1 tablet (75 mg total) by mouth daily. 30 tablet 11   No current facility-administered medications for this visit.    Allergies as of 04/29/2023   (No Known Allergies)    Family History  Problem Relation Age of Onset   Breast cancer Mother    Heart disease Mother    Diabetes Mother    Cancer Mother        breast   Stroke Mother  Hypertension Mother    Lung cancer Father    Stroke Father    Cancer Father        oat cell cancer/lung   Kidney cancer Brother    Cancer Brother        renal cell cancer   Healthy Brother    Diabetes Son     Social History   Socioeconomic History   Marital status: Divorced    Spouse name: Not on file   Number of children: Not on file   Years of education: Not on file   Highest education level: Not on file  Occupational History   Not on file  Tobacco Use   Smoking status: Never    Passive exposure: Never   Smokeless tobacco: Never  Vaping Use   Vaping status: Never Used  Substance and Sexual Activity   Alcohol use: No    Alcohol/week: 0.0 standard drinks of alcohol   Drug use: No   Sexual activity: Never  Other Topics Concern   Not on file  Social History Narrative   Not on file   Social Determinants of Health   Financial Resource Strain: Not on file  Food Insecurity: Not on file  Transportation Needs: Not on file  Physical Activity: Not on file  Stress: Not on file  Social  Connections: Not on file  Intimate Partner Violence: Not on file     Review of Systems   Gen: Denies any fever, chills, fatigue, weight loss, lack of appetite.  CV: Denies chest pain, heart palpitations, peripheral edema, syncope.  Resp: Denies shortness of breath at rest or with exertion. Denies wheezing or cough.  GI: Denies dysphagia or odynophagia. Denies jaundice, hematemesis, fecal incontinence. GU : Denies urinary burning, urinary frequency, urinary hesitancy MS: Denies joint pain, muscle weakness, cramps, or limitation of movement.  Derm: Denies rash, itching, dry skin Psych: Denies depression, anxiety, memory loss, and confusion Heme: Denies bruising, bleeding, and enlarged lymph nodes.   Physical Exam   BP 114/71   Pulse 89   Temp 98.1 F (36.7 C)   Ht 5\' 2"  (1.575 m)   Wt 101 lb 12.8 oz (46.2 kg)   BMI 18.62 kg/m  General:   Alert and oriented. Pleasant and cooperative. Well-nourished and well-developed.  Head:  Normocephalic and atraumatic. Eyes:  Without icterus Abdomen:  +BS, soft, non-tender and non-distended. No HSM noted. No guarding or rebound. No masses appreciated.  Rectal:  Deferred  Msk:  Symmetrical without gross deformities. Normal posture. Extremities:  Without edema. Neurologic:  Alert and  oriented x4 Skin:  Intact without significant lesions or rashes. Psych:  Alert and cooperative. Normal mood and affect.   Assessment   Terry Allen is an  82 y.o. female presenting today with a history of chronic GERD, esophageal web s/p multiple dilations historically, hyperlipidemia, hypothyroidism, osteopenia, recurrent dysphagia, remote history of Barrett's but biopsies in 2013 and 2016 negative for Barrett's, no changes suggesting Barrett's in 2019/2020, multiple prior dilatations in the past, last seen Nov 2023 by Dr. Levon Hedger.   She now is presenting with recurrent dysphagia. Last EGD in June 2023 on 2 occasions with improvement s/p dilation.  She is not on a PPI, as it appears this was taken off some time ago and remains on Pepcid. May need to consider resuming PPI in future.   As she has noted improvement with dilations historically and has been over a year, will pursue EGD/dilation in near future.  PLAN    Continue Pepcid Proceed with upper endoscopy/dilation by Dr. Levon Hedger in near future: the risks, benefits, and alternatives have been discussed with the patient in detail. The patient states understanding and desires to proceed.  Colonoscopy in 2026 if medically fit Return in follow-up thereafter Consider adding PPI daily    Gelene Mink, PhD, [facility]   I have reviewed the note and agree with the APP's assessment as described in this progress note  Katrinka Blazing, MD Gastroenterology and Hepatology [facility]

## 2023-04-29 NOTE — H&P (View-Only) (Signed)
 Gastroenterology Office Note     Primary Care Physician:  Rica Records, FNP  Primary Gastroenterologist: Dr. Levon Hedger    Chief Complaint   Chief Complaint  Patient presents with   New Patient (Initial Visit)    Pt states she is here for a cough for about 3 weeks, with nausea some dysphagia. States she seen her PCP yesterday     History of Present Illness   Terry Allen is an 82 y.o. female presenting today with a history of GERD, esophageal web, hyperlipidemia, hypothyroidism, osteopenia, recurrent dysphagia, remote history of Barrett's but biopsies in 2013 and 2016 negative for Barrett's, no changes suggesting Barrett's in 2019/2020, multiple prior dilatations in the past, last seen Nov 2023 by Dr. Levon Hedger.   Her last EGD was actually in June 2023 X 2 separate occasions. She noted improvement historically with dilations.   Recurrent dysphagia and points to neck. Present for about 3-4 weeks or longer. Anything with crust gets hung. Liquids go down ok. Epigastric pain noted. Somewhat difficult historian. No postprandial abdominal pain. Denies typical heart burn. It appears she was taken off a PPI several years ago, as Pepcid seemed to help with her chronic epigastric pain instead.       Last Colonoscopy: 11/03/2019 - One small polyp in the cecum. Biopsied. - Diverticulosis in the sigmoid colon.   Path: A. COLON, CECAL, POLYPECTOMY:  - Sessile serrated polyp/adenoma (s) without cytologic dysplasia.  - No evidence of carcinoma.    Recommended repeat colonoscopy in 5 years if medically fit.  Past Medical History:  Diagnosis Date   Acid reflux    Patient reports Barretts   Bulging lumbar disc    High cholesterol    Hypothyroidism    Nerve damage    Osteopenia    Pelvic pain in female    due to bladder sling    Past Surgical History:  Procedure Laterality Date   APPENDECTOMY  1960   BIOPSY  11/03/2019   Procedure: BIOPSY;  Surgeon: Malissa Hippo, MD;  [facility];  Service: Endoscopy;;   BLADDER SUSPENSION     CATARACT EXTRACTION W/PHACO Right 06/07/2019   Procedure: CATARACT EXTRACTION PHACO AND INTRAOCULAR LENS PLACEMENT (IOC);  Surgeon: Fabio Pierce, MD;  Location: [facility];  Service: Ophthalmology;  Laterality: Right;  CDE: 10.87   CATARACT EXTRACTION W/PHACO Left 06/21/2019   Procedure: CATARACT EXTRACTION PHACO AND INTRAOCULAR LENS PLACEMENT (IOC);  Surgeon: Fabio Pierce, MD;  Location: [facility];  Service: Ophthalmology;  Laterality: Left;  CDE: 11.59   COLONOSCOPY N/A 11/03/2019   Procedure: COLONOSCOPY;  Surgeon: Malissa Hippo, MD;  [facility];  Service: Endoscopy;  Laterality: N/A;  1245   ESOPHAGEAL DILATION N/A 05/27/2018   Procedure: ESOPHAGEAL DILATION;  Surgeon: Malissa Hippo, MD;  [facility];  Service: Endoscopy;  Laterality: N/A;   ESOPHAGEAL DILATION N/A 02/12/2019   Procedure: ESOPHAGEAL DILATION;  Surgeon: Malissa Hippo, MD;  [facility];  Service: Endoscopy;  Laterality: N/A;   ESOPHAGEAL DILATION N/A 02/14/2022   Procedure: ESOPHAGEAL DILATION;  Surgeon: Malissa Hippo, MD;  [facility];  Service: Endoscopy;  Laterality: N/A;   ESOPHAGEAL DILATION N/A 02/27/2022   Procedure: ESOPHAGEAL DILATION;  Surgeon: Dolores Frame, MD;  [facility];  Service: Gastroenterology;  Laterality: N/A;   ESOPHAGOGASTRODUODENOSCOPY N/A 12/21/2014   Procedure: ESOPHAGOGASTRODUODENOSCOPY (EGD);  Surgeon: Malissa Hippo, MD;  [facility];  Service: Endoscopy;  Laterality: N/A;  210   ESOPHAGOGASTRODUODENOSCOPY N/A 06/16/2015   Procedure: ESOPHAGOGASTRODUODENOSCOPY (EGD);  Surgeon: Malissa Hippo, MD;  [facility];  Service: Endoscopy;  Laterality: N/A;  1250   ESOPHAGOGASTRODUODENOSCOPY N/A 05/27/2018   Procedure: ESOPHAGOGASTRODUODENOSCOPY (EGD);  Surgeon: Malissa Hippo, MD;  [facility];  Service:  Endoscopy;  Laterality: N/A;  7:30   ESOPHAGOGASTRODUODENOSCOPY N/A 02/12/2019   Procedure: ESOPHAGOGASTRODUODENOSCOPY (EGD);  Surgeon: Malissa Hippo, MD;  [facility];  Service: Endoscopy;  Laterality: N/A;   ESOPHAGOGASTRODUODENOSCOPY (EGD) WITH PROPOFOL N/A 02/14/2022   Procedure: ESOPHAGOGASTRODUODENOSCOPY (EGD) WITH PROPOFOL;  Surgeon: Malissa Hippo, MD;  [facility];  Service: Endoscopy;  Laterality: N/A;  920   ESOPHAGOGASTRODUODENOSCOPY (EGD) WITH PROPOFOL N/A 02/27/2022   Procedure: ESOPHAGOGASTRODUODENOSCOPY (EGD) WITH PROPOFOL;  Surgeon: Dolores Frame, MD;  [facility];  Service: Gastroenterology;  Laterality: N/A;  1:15 Per Dr Levon Hedger asa 1    Current Outpatient Medications  Medication Sig Dispense Refill   acetaminophen (TYLENOL) 325 MG tablet Take 2 tablets (650 mg total) by mouth every 6 (six) hours as needed for mild pain (or Fever >/= 101).     albuterol (VENTOLIN HFA) 108 (90 Base) MCG/ACT inhaler Inhale 2 puffs into the lungs every 4 (four) hours as needed for wheezing or shortness of breath. 1 each 3   amitriptyline (ELAVIL) 50 MG tablet Take 50 mg by mouth at bedtime.   2   atorvastatin (LIPITOR) 40 MG tablet Take 40 mg by mouth at bedtime.      Calcium Carb-Cholecalciferol (CALCIUM 600 + D PO) Take 1 tablet by mouth 2 (two) times a day.     estradiol (ESTRACE) 0.1 MG/GM vaginal cream Discard plastic applicator. Insert a blueberry size amount (approximately 1 gram) of cream on fingertip inside vagina at bedtime every night for 1 week then 2 nights per week for long term use. 30 g 3   famotidine (PEPCID) 20 MG tablet Take 20 mg by mouth 2 (two) times daily.     gabapentin (NEURONTIN) 300 MG capsule Take 1 capsule (300 mg total) by mouth 3 (three) times daily.     levothyroxine (SYNTHROID) 50 MCG tablet Take 50 mcg by mouth every other day.     levothyroxine (SYNTHROID) 75 MCG tablet Take 75 mcg by mouth every 3 (three) days.  Alternating with 50 mcg.     meclizine (ANTIVERT) 12.5 MG tablet Take 1 tablet (12.5 mg total) by mouth 3 (three) times daily as needed for dizziness. 30 tablet 0   ondansetron (ZOFRAN-ODT) 4 MG disintegrating tablet Take 1 tablet (4 mg total) by mouth every 8 (eight) hours as needed for nausea or vomiting. 60 tablet 2   polyethylene glycol (MIRALAX) 17 g packet Take 17 g by mouth daily. 100 each 0   potassium chloride SA (KLOR-CON M) 20 MEQ tablet Take 1 tablet (20 mEq total) by mouth 2 (two) times daily. 4 tablet 0   Vibegron (GEMTESA) 75 MG TABS Take 1 tablet (75 mg total) by mouth daily. 30 tablet 11   No current facility-administered medications for this visit.    Allergies as of 04/29/2023   (No Known Allergies)    Family History  Problem Relation Age of Onset   Breast cancer Mother    Heart disease Mother    Diabetes Mother    Cancer Mother        breast   Stroke Mother  Hypertension Mother    Lung cancer Father    Stroke Father    Cancer Father        oat cell cancer/lung   Kidney cancer Brother    Cancer Brother        renal cell cancer   Healthy Brother    Diabetes Son     Social History   Socioeconomic History   Marital status: Divorced    Spouse name: Not on file   Number of children: Not on file   Years of education: Not on file   Highest education level: Not on file  Occupational History   Not on file  Tobacco Use   Smoking status: Never    Passive exposure: Never   Smokeless tobacco: Never  Vaping Use   Vaping status: Never Used  Substance and Sexual Activity   Alcohol use: No    Alcohol/week: 0.0 standard drinks of alcohol   Drug use: No   Sexual activity: Never  Other Topics Concern   Not on file  Social History Narrative   Not on file   Social Determinants of Health   Financial Resource Strain: Not on file  Food Insecurity: Not on file  Transportation Needs: Not on file  Physical Activity: Not on file  Stress: Not on file  Social  Connections: Not on file  Intimate Partner Violence: Not on file     Review of Systems   Gen: Denies any fever, chills, fatigue, weight loss, lack of appetite.  CV: Denies chest pain, heart palpitations, peripheral edema, syncope.  Resp: Denies shortness of breath at rest or with exertion. Denies wheezing or cough.  GI: Denies dysphagia or odynophagia. Denies jaundice, hematemesis, fecal incontinence. GU : Denies urinary burning, urinary frequency, urinary hesitancy MS: Denies joint pain, muscle weakness, cramps, or limitation of movement.  Derm: Denies rash, itching, dry skin Psych: Denies depression, anxiety, memory loss, and confusion Heme: Denies bruising, bleeding, and enlarged lymph nodes.   Physical Exam   BP 114/71   Pulse 89   Temp 98.1 F (36.7 C)   Ht 5\' 2"  (1.575 m)   Wt 101 lb 12.8 oz (46.2 kg)   BMI 18.62 kg/m  General:   Alert and oriented. Pleasant and cooperative. Well-nourished and well-developed.  Head:  Normocephalic and atraumatic. Eyes:  Without icterus Abdomen:  +BS, soft, non-tender and non-distended. No HSM noted. No guarding or rebound. No masses appreciated.  Rectal:  Deferred  Msk:  Symmetrical without gross deformities. Normal posture. Extremities:  Without edema. Neurologic:  Alert and  oriented x4 Skin:  Intact without significant lesions or rashes. Psych:  Alert and cooperative. Normal mood and affect.   Assessment   Terry Allen is an  82 y.o. female presenting today with a history of chronic GERD, esophageal web s/p multiple dilations historically, hyperlipidemia, hypothyroidism, osteopenia, recurrent dysphagia, remote history of Barrett's but biopsies in 2013 and 2016 negative for Barrett's, no changes suggesting Barrett's in 2019/2020, multiple prior dilatations in the past, last seen Nov 2023 by Dr. Levon Hedger.   She now is presenting with recurrent dysphagia. Last EGD in June 2023 on 2 occasions with improvement s/p dilation.  She is not on a PPI, as it appears this was taken off some time ago and remains on Pepcid. May need to consider resuming PPI in future.   As she has noted improvement with dilations historically and has been over a year, will pursue EGD/dilation in near future.  PLAN    Continue Pepcid Proceed with upper endoscopy/dilation by Dr. Levon Hedger in near future: the risks, benefits, and alternatives have been discussed with the patient in detail. The patient states understanding and desires to proceed.  Colonoscopy in 2026 if medically fit Return in follow-up thereafter Consider adding PPI daily    Gelene Mink, PhD, [facility] Gastroenterology   I have reviewed the note and agree with the APP's assessment as described in this progress note  Katrinka Blazing, MD Gastroenterology and Hepatology [facility] Gastroenterology

## 2023-04-30 ENCOUNTER — Telehealth: Payer: Self-pay | Admitting: *Deleted

## 2023-04-30 NOTE — Telephone Encounter (Signed)
Pt returned call and states she is sick right now, she will call back when she is ready to schedule procedure.

## 2023-04-30 NOTE — Telephone Encounter (Signed)
[facility]  EGD/ED w/Dr.Castaneda, asa 2

## 2023-05-01 LAB — GI PROFILE, STOOL, PCR

## 2023-05-02 ENCOUNTER — Telehealth: Payer: Self-pay | Admitting: Family Medicine

## 2023-05-02 NOTE — Telephone Encounter (Signed)
lvm

## 2023-05-02 NOTE — Telephone Encounter (Signed)
Patient requesting a phone call for lab results when they come in. Thanks

## 2023-05-02 NOTE — Telephone Encounter (Signed)
Labs were negative

## 2023-05-06 ENCOUNTER — Encounter: Payer: Self-pay | Admitting: Family Medicine

## 2023-05-06 ENCOUNTER — Encounter: Payer: Self-pay | Admitting: *Deleted

## 2023-05-06 ENCOUNTER — Ambulatory Visit (INDEPENDENT_AMBULATORY_CARE_PROVIDER_SITE_OTHER): Payer: Medicare HMO | Admitting: Family Medicine

## 2023-05-06 VITALS — BP 113/64 | HR 84 | Ht 62.0 in | Wt 105.0 lb

## 2023-05-06 DIAGNOSIS — N3090 Cystitis, unspecified without hematuria: Secondary | ICD-10-CM

## 2023-05-06 DIAGNOSIS — R3915 Urgency of urination: Secondary | ICD-10-CM | POA: Diagnosis not present

## 2023-05-06 MED ORDER — SULFAMETHOXAZOLE-TRIMETHOPRIM 800-160 MG PO TABS
1.0000 | ORAL_TABLET | Freq: Two times a day (BID) | ORAL | 0 refills | Status: AC
Start: 2023-05-06 — End: 2023-05-13

## 2023-05-06 NOTE — Telephone Encounter (Signed)
Pt has been scheduled for 05/14/23. Instructions printed and pt to come by office to pick them up.   [employer] PA: Approved Authorization #[id number]  Tracking #[id number] Dates of service 05/14/2023 - 08/13/2023

## 2023-05-06 NOTE — Telephone Encounter (Signed)
Pt left vm stating that she was ready to schedule her procedure.  LMTRC

## 2023-05-06 NOTE — Patient Instructions (Addendum)
I appreciate the opportunity to provide care to you today!    Follow up:  pcp  You are being treated today for a urinary tract infection (UTI) with a 7-day course of Bactrim. Please ensure that you complete the full course of antibiotics as prescribed.  To help prevent future UTIs, consider the following measures:  Avoid holding urine for extended periods, as this can stretch the bladder and create an environment conducive to bacterial growth. Empty your bladder as soon as you feel the urge. Urinate soon after intercourse. Opt for showers rather than baths. Wipe from front to back after urination and bowel movements to prevent bacteria from the anal region from spreading to the vagina and urethra. Drink a full glass of water to help flush out bacteria. If you have any questions or concerns, please do not hesitate to reach out.  It was a pleasure to see you and I look forward to continuing to work together on your health and well-being. Please do not hesitate to call the office if you need care or have questions about your care.  In case of emergency, please visit the Emergency Department for urgent care, or contact our clinic at [phone] to schedule an appointment. We're here to help you!   Have a wonderful day and week. With Gratitude, Gilmore Laroche MSN, FNP-BC

## 2023-05-06 NOTE — Assessment & Plan Note (Signed)
UA is positive for leukocyte Will treat today with Bactrim for 7 days Encouraged to complete the full course of antibiotics as prescribed.  To help prevent future UTIs, consider the following measures:  Avoid holding urine for extended periods, as this can stretch the bladder and create an environment conducive to bacterial growth. Empty your bladder as soon as you feel the urge. Urinate soon after intercourse. Opt for showers rather than baths. Wipe from front to back after urination and bowel movements to prevent bacteria from the anal region from spreading to the vagina and urethra. Drink a full glass of water to help flush out bacteria. If you have any questions or concerns, please do not hesitate to reach out.

## 2023-05-06 NOTE — Progress Notes (Signed)
Established Patient Office Visit  Subjective:  Patient ID: Terry Allen, female    DOB: 29-Jun-1941  Age: 82 y.o. MRN: [id number]  CC:  Chief Complaint  Patient presents with   Urinary Tract Infection    Pt reports urinary burning and urgency, also pelvic pain. Ongoing for 11 days    HPI Terry Allen is a 82 y.o. female with past medical history of recurrent UTI and will be following up with urology May 14, 2023 presents with complaints of urinary urgency, frequency, dysuria, and suprapubic pain.  Past Medical History:  Diagnosis Date   Acid reflux    Patient reports Barretts   Bulging lumbar disc    High cholesterol    Hypothyroidism    Nerve damage    Osteopenia    Pelvic pain in female    due to bladder sling    Past Surgical History:  Procedure Laterality Date   APPENDECTOMY  1960   BIOPSY  11/03/2019   Procedure: BIOPSY;  Surgeon: Malissa Hippo, MD;  [facility];  Service: Endoscopy;;   BLADDER SUSPENSION     CATARACT EXTRACTION W/PHACO Right 06/07/2019   Procedure: CATARACT EXTRACTION PHACO AND INTRAOCULAR LENS PLACEMENT (IOC);  Surgeon: Fabio Pierce, MD;  [facility]: [facility];  Service: Ophthalmology;  Laterality: Right;  CDE: 10.87   CATARACT EXTRACTION W/PHACO Left 06/21/2019   Procedure: CATARACT EXTRACTION PHACO AND INTRAOCULAR LENS PLACEMENT (IOC);  Surgeon: Fabio Pierce, MD;  [facility]: [facility];  Service: Ophthalmology;  Laterality: Left;  CDE: 11.59   COLONOSCOPY N/A 11/03/2019   Procedure: COLONOSCOPY;  Surgeon: Malissa Hippo, MD;  [facility];  Service: Endoscopy;  Laterality: N/A;  1245   ESOPHAGEAL DILATION N/A 05/27/2018   Procedure: ESOPHAGEAL DILATION;  Surgeon: Malissa Hippo, MD;  [facility];  Service: Endoscopy;  Laterality: N/A;   ESOPHAGEAL DILATION N/A 02/12/2019   Procedure: ESOPHAGEAL DILATION;  Surgeon: Malissa Hippo, MD;  [facility];  Service: Endoscopy;  Laterality:  N/A;   ESOPHAGEAL DILATION N/A 02/14/2022   Procedure: ESOPHAGEAL DILATION;  Surgeon: Malissa Hippo, MD;  [facility];  Service: Endoscopy;  Laterality: N/A;   ESOPHAGEAL DILATION N/A 02/27/2022   Procedure: ESOPHAGEAL DILATION;  Surgeon: Dolores Frame, MD;  [facility];  Service: Gastroenterology;  Laterality: N/A;   ESOPHAGOGASTRODUODENOSCOPY N/A 12/21/2014   Procedure: ESOPHAGOGASTRODUODENOSCOPY (EGD);  Surgeon: Malissa Hippo, MD;  [facility];  Service: Endoscopy;  Laterality: N/A;  210   ESOPHAGOGASTRODUODENOSCOPY N/A 06/16/2015   Procedure: ESOPHAGOGASTRODUODENOSCOPY (EGD);  Surgeon: Malissa Hippo, MD;  [facility];  Service: Endoscopy;  Laterality: N/A;  1250   ESOPHAGOGASTRODUODENOSCOPY N/A 05/27/2018   Procedure: ESOPHAGOGASTRODUODENOSCOPY (EGD);  Surgeon: Malissa Hippo, MD;  [facility];  Service: Endoscopy;  Laterality: N/A;  7:30   ESOPHAGOGASTRODUODENOSCOPY N/A 02/12/2019   Procedure: ESOPHAGOGASTRODUODENOSCOPY (EGD);  Surgeon: Malissa Hippo, MD;  [facility];  Service: Endoscopy;  Laterality: N/A;   ESOPHAGOGASTRODUODENOSCOPY (EGD) WITH PROPOFOL N/A 02/14/2022   Procedure: ESOPHAGOGASTRODUODENOSCOPY (EGD) WITH PROPOFOL;  Surgeon: Malissa Hippo, MD;  [facility];  Service: Endoscopy;  Laterality: N/A;  920   ESOPHAGOGASTRODUODENOSCOPY (EGD) WITH PROPOFOL N/A 02/27/2022   Procedure: ESOPHAGOGASTRODUODENOSCOPY (EGD) WITH PROPOFOL;  Surgeon: Dolores Frame, MD;  [facility];  Service: Gastroenterology;  Laterality: N/A;  1:15 Per Dr Levon Hedger asa 1    Family History  Problem  Relation Age of Onset   Breast cancer Mother    Heart disease Mother    Diabetes Mother    Cancer Mother        breast   Stroke Mother    Hypertension Mother    Lung cancer Father    Stroke Father    Cancer Father        oat cell cancer/lung   Kidney cancer Brother    Cancer  Brother        renal cell cancer   Healthy Brother    Diabetes Son     Social History   Socioeconomic History   Marital status: Divorced    Spouse name: Not on file   Number of children: Not on file   Years of education: Not on file   Highest education level: Not on file  Occupational History   Not on file  Tobacco Use   Smoking status: Never    Passive exposure: Never   Smokeless tobacco: Never  Vaping Use   Vaping status: Never Used  Substance and Sexual Activity   Alcohol use: No    Alcohol/week: 0.0 standard drinks of alcohol   Drug use: No   Sexual activity: Never  Other Topics Concern   Not on file  Social History Narrative   Not on file   Social Determinants of Health   Financial Resource Strain: Not on file  Food Insecurity: Not on file  Transportation Needs: Not on file  Physical Activity: Not on file  Stress: Not on file  Social Connections: Not on file  Intimate Partner Violence: Not on file    Outpatient Medications Prior to Visit  Medication Sig Dispense Refill   acetaminophen (TYLENOL) 325 MG tablet Take 2 tablets (650 mg total) by mouth every 6 (six) hours as needed for mild pain (or Fever >/= 101).     albuterol (VENTOLIN HFA) 108 (90 Base) MCG/ACT inhaler Inhale 2 puffs into the lungs every 4 (four) hours as needed for wheezing or shortness of breath. 1 each 3   amitriptyline (ELAVIL) 50 MG tablet Take 50 mg by mouth at bedtime.   2   atorvastatin (LIPITOR) 40 MG tablet Take 40 mg by mouth at bedtime.      Calcium Carb-Cholecalciferol (CALCIUM 600 + D PO) Take 1 tablet by mouth 2 (two) times a day.     estradiol (ESTRACE) 0.1 MG/GM vaginal cream Discard plastic applicator. Insert a blueberry size amount (approximately 1 gram) of cream on fingertip inside vagina at bedtime every night for 1 week then 2 nights per week for long term use. 30 g 3   famotidine (PEPCID) 20 MG tablet Take 20 mg by mouth 2 (two) times daily.     gabapentin (NEURONTIN) 300  MG capsule Take 1 capsule (300 mg total) by mouth 3 (three) times daily.     levothyroxine (SYNTHROID) 50 MCG tablet Take 50 mcg by mouth every other day.     levothyroxine (SYNTHROID) 75 MCG tablet Take 75 mcg by mouth every 3 (three) days. Alternating with 50 mcg.     meclizine (ANTIVERT) 12.5 MG tablet Take 1 tablet (12.5 mg total) by mouth 3 (three) times daily as needed for dizziness. 30 tablet 0   ondansetron (ZOFRAN-ODT) 4 MG disintegrating tablet Take 1 tablet (4 mg total) by mouth every 8 (eight) hours as needed for nausea or vomiting. 60 tablet 2   polyethylene glycol (MIRALAX) 17 g packet Take 17 g by mouth daily. 100  each 0   potassium chloride SA (KLOR-CON M) 20 MEQ tablet Take 1 tablet (20 mEq total) by mouth 2 (two) times daily. 4 tablet 0   Vibegron (GEMTESA) 75 MG TABS Take 1 tablet (75 mg total) by mouth daily. 30 tablet 11   No facility-administered medications prior to visit.    No Known Allergies  ROS Review of Systems  Constitutional:  Negative for chills and fever.  Eyes:  Negative for visual disturbance.  Respiratory:  Negative for chest tightness and shortness of breath.   Genitourinary:  Positive for frequency, pelvic pain and urgency. Negative for flank pain and hematuria.  Neurological:  Negative for dizziness and headaches.      Objective:    Physical Exam HENT:     Head: Normocephalic.     Mouth/Throat:     Mouth: Mucous membranes are moist.  Cardiovascular:     Rate and Rhythm: Normal rate.     Heart sounds: Normal heart sounds.  Pulmonary:     Effort: Pulmonary effort is normal.     Breath sounds: Normal breath sounds.  Abdominal:     Tenderness: There is abdominal tenderness in the suprapubic area. There is right CVA tenderness and left CVA tenderness.  Neurological:     Mental Status: She is alert.     BP 113/64   Pulse 84   Ht 5\' 2"  (1.575 m)   Wt 105 lb 0.6 oz (47.6 kg)   SpO2 95%   BMI 19.21 kg/m  Wt Readings from Last 3  Encounters:  05/06/23 105 lb 0.6 oz (47.6 kg)  04/29/23 101 lb 12.8 oz (46.2 kg)  04/28/23 101 lb 0.6 oz (45.8 kg)    Lab Results  Component Value Date   TSH 1.360 12/12/2022   Lab Results  Component Value Date   WBC 5.3 12/12/2022   HGB 12.9 12/12/2022   HCT 39.1 12/12/2022   MCV 94 12/12/2022   PLT 268 12/12/2022   Lab Results  Component Value Date   NA 142 12/12/2022   K 4.5 12/12/2022   CO2 26 12/12/2022   GLUCOSE 93 12/12/2022   BUN 7 (L) 12/12/2022   CREATININE 0.83 12/12/2022   BILITOT 0.6 12/12/2022   ALKPHOS 75 12/12/2022   AST 39 12/12/2022   ALT 36 (H) 12/12/2022   PROT 6.7 12/12/2022   ALBUMIN 4.1 12/12/2022   CALCIUM 9.6 12/12/2022   ANIONGAP 13 06/26/2022   EGFR 71 12/12/2022   Lab Results  Component Value Date   CHOL 151 12/12/2022   Lab Results  Component Value Date   HDL 62 12/12/2022   Lab Results  Component Value Date   LDLCALC 64 12/12/2022   Lab Results  Component Value Date   TRIG 145 12/12/2022   Lab Results  Component Value Date   CHOLHDL 2.4 12/12/2022   Lab Results  Component Value Date   HGBA1C 5.8 (H) 12/12/2022      Assessment & Plan:  Cystitis Assessment & Plan: UA is positive for leukocyte Will treat today with Bactrim for 7 days Encouraged to complete the full course of antibiotics as prescribed.  To help prevent future UTIs, consider the following measures:  Avoid holding urine for extended periods, as this can stretch the bladder and create an environment conducive to bacterial growth. Empty your bladder as soon as you feel the urge. Urinate soon after intercourse. Opt for showers rather than baths. Wipe from front to back after urination and bowel movements to prevent  bacteria from the anal region from spreading to the vagina and urethra. Drink a full glass of water to help flush out bacteria. If you have any questions or concerns, please do not hesitate to reach out.  Orders: -     Urinalysis -      Sulfamethoxazole-Trimethoprim; Take 1 tablet by mouth 2 (two) times daily for 7 days.  Dispense: 14 tablet; Refill: 0 -     Urine Culture  Note: This chart has been completed using Engineer, civil (consulting) software, and while attempts have been made to ensure accuracy, certain words and phrases may not be transcribed as intended.    Follow-up: No follow-ups on file.   Gilmore Laroche, FNP

## 2023-05-11 LAB — URINE CULTURE

## 2023-05-13 ENCOUNTER — Other Ambulatory Visit: Payer: Self-pay | Admitting: Family Medicine

## 2023-05-13 DIAGNOSIS — E039 Hypothyroidism, unspecified: Secondary | ICD-10-CM | POA: Diagnosis not present

## 2023-05-14 ENCOUNTER — Other Ambulatory Visit: Payer: Self-pay

## 2023-05-14 ENCOUNTER — Ambulatory Visit (HOSPITAL_BASED_OUTPATIENT_CLINIC_OR_DEPARTMENT_OTHER): Payer: Medicare HMO | Admitting: Anesthesiology

## 2023-05-14 ENCOUNTER — Ambulatory Visit (HOSPITAL_COMMUNITY): Payer: Medicare HMO | Admitting: Anesthesiology

## 2023-05-14 ENCOUNTER — Other Ambulatory Visit: Payer: Self-pay | Admitting: Family Medicine

## 2023-05-14 ENCOUNTER — Other Ambulatory Visit (INDEPENDENT_AMBULATORY_CARE_PROVIDER_SITE_OTHER): Payer: Self-pay

## 2023-05-14 ENCOUNTER — Encounter (HOSPITAL_COMMUNITY)
Admission: RE | Disposition: A | Discharge status: Home / Self Care | Payer: Self-pay | Source: Home / Self Care | Attending: Gastroenterology

## 2023-05-14 ENCOUNTER — Encounter (HOSPITAL_COMMUNITY): Payer: Self-pay | Admitting: Gastroenterology

## 2023-05-14 ENCOUNTER — Ambulatory Visit (HOSPITAL_COMMUNITY)
Admission: RE | Admit: 2023-05-14 | Discharge: 2023-05-14 | Disposition: A | Discharge status: Home / Self Care | Payer: Medicare HMO | Attending: Gastroenterology | Admitting: Gastroenterology

## 2023-05-14 DIAGNOSIS — R1310 Dysphagia, unspecified: Secondary | ICD-10-CM

## 2023-05-14 DIAGNOSIS — Q394 Esophageal web: Secondary | ICD-10-CM | POA: Insufficient documentation

## 2023-05-14 DIAGNOSIS — E7800 Pure hypercholesterolemia, unspecified: Secondary | ICD-10-CM | POA: Diagnosis not present

## 2023-05-14 DIAGNOSIS — K219 Gastro-esophageal reflux disease without esophagitis: Secondary | ICD-10-CM | POA: Diagnosis not present

## 2023-05-14 DIAGNOSIS — E039 Hypothyroidism, unspecified: Secondary | ICD-10-CM

## 2023-05-14 DIAGNOSIS — Z8719 Personal history of other diseases of the digestive system: Secondary | ICD-10-CM | POA: Diagnosis not present

## 2023-05-14 HISTORY — PX: ESOPHAGEAL DILATION: SHX303

## 2023-05-14 HISTORY — PX: ESOPHAGOGASTRODUODENOSCOPY (EGD) WITH PROPOFOL: SHX5813

## 2023-05-14 LAB — URINALYSIS
Bilirubin, UA: NEGATIVE
Glucose, UA: NEGATIVE
Ketones, UA: NEGATIVE
Nitrite, UA: NEGATIVE
Specific Gravity, UA: 1.015 (ref 1.005–1.030)
Urobilinogen, Ur: 0.2 mg/dL (ref 0.2–1.0)
pH, UA: 6 (ref 5.0–7.5)

## 2023-05-14 LAB — TSH+FREE T4
Free T4: 0.93 ng/dL (ref 0.82–1.77)
TSH: 17.3 u[IU]/mL — ABNORMAL HIGH (ref 0.450–4.500)

## 2023-05-14 SURGERY — ESOPHAGOGASTRODUODENOSCOPY (EGD) WITH PROPOFOL
Anesthesia: General

## 2023-05-14 MED ORDER — PHENYLEPHRINE 80 MCG/ML (10ML) SYRINGE FOR IV PUSH (FOR BLOOD PRESSURE SUPPORT)
PREFILLED_SYRINGE | INTRAVENOUS | Status: AC
  Filled 2023-05-14: qty 10

## 2023-05-14 MED ORDER — PROPOFOL 10 MG/ML IV BOLUS
INTRAVENOUS | Status: DC | PRN
  Administered 2023-05-14: 80 mg via INTRAVENOUS

## 2023-05-14 MED ORDER — LIDOCAINE VISCOUS HCL 2 % MT SOLN
15.0000 mL | Freq: Once | OROMUCOSAL | Status: AC
  Administered 2023-05-14: 15 mL via OROMUCOSAL

## 2023-05-14 MED ORDER — LIDOCAINE VISCOUS HCL 2 % MT SOLN: 5.0000 mL | Freq: Three times a day (TID) | OROMUCOSAL | 1 refills | Status: DC | PRN

## 2023-05-14 MED ORDER — LIDOCAINE HCL (CARDIAC) PF 100 MG/5ML IV SOSY
PREFILLED_SYRINGE | INTRAVENOUS | Status: DC | PRN
  Administered 2023-05-14: 60 mg via INTRATRACHEAL

## 2023-05-14 MED ORDER — PROPOFOL 500 MG/50ML IV EMUL
INTRAVENOUS | Status: DC | PRN
  Administered 2023-05-14: 150 ug/kg/min via INTRAVENOUS

## 2023-05-14 MED ORDER — PROPOFOL 500 MG/50ML IV EMUL
INTRAVENOUS | Status: AC
  Filled 2023-05-14: qty 100

## 2023-05-14 MED ORDER — LIDOCAINE VISCOUS HCL 2 % MT SOLN
OROMUCOSAL | Status: AC
  Filled 2023-05-14: qty 15

## 2023-05-14 MED ORDER — FAMOTIDINE 20 MG PO TABS: 20.0000 mg | ORAL_TABLET | Freq: Every day | ORAL | 3 refills | Status: DC

## 2023-05-14 MED ORDER — EPHEDRINE 5 MG/ML INJ
INTRAVENOUS | Status: AC
  Filled 2023-05-14: qty 5

## 2023-05-14 MED ORDER — LACTATED RINGERS IV SOLN: INTRAVENOUS | Status: DC

## 2023-05-14 MED ORDER — LACTATED RINGERS IV SOLN: INTRAVENOUS | Status: DC | PRN

## 2023-05-14 MED ORDER — OMEPRAZOLE 40 MG PO CPDR: 40.0000 mg | DELAYED_RELEASE_CAPSULE | Freq: Every day | ORAL | 3 refills | Status: DC

## 2023-05-14 NOTE — Telephone Encounter (Signed)
Per [facility] pharmacy they can not dispense this lidocaine mouth wash, it will need to be sent to [employer] [facility] or another Set designer. Thanks

## 2023-05-14 NOTE — Discharge Instructions (Addendum)
You are being discharged to home.  Resume your previous diet.  Continue your present medications.  Repeat EGD in 6 weeks. Start omeprazole 40 mg qday Decrease famotidine to 20 mg at bedtime.

## 2023-05-14 NOTE — Anesthesia Preprocedure Evaluation (Signed)
Anesthesia Evaluation  Patient identified by MRN, date of birth, ID band Patient awake    Reviewed: Allergy & Precautions, H&P , NPO status , Patient's Chart, lab work & pertinent test results  Airway Mallampati: II  TM Distance: >3 FB Neck ROM: Full    Dental  (+) Dental Advisory Given, Missing, Chipped, Poor Dentition   Pulmonary neg pulmonary ROS   Pulmonary exam normal breath sounds clear to auscultation       Cardiovascular negative cardio ROS Normal cardiovascular exam Rhythm:Regular Rate:Normal     Neuro/Psych negative neurological ROS  negative psych ROS   GI/Hepatic Neg liver ROS,GERD  Medicated and Poorly Controlled,,  Endo/Other  Hypothyroidism    Renal/GU negative Renal ROS  negative genitourinary   Musculoskeletal negative musculoskeletal ROS (+)    Abdominal   Peds negative pediatric ROS (+)  Hematology negative hematology ROS (+)   Anesthesia Other Findings   Reproductive/Obstetrics negative OB ROS                              Anesthesia Physical Anesthesia Plan  ASA: 2  Anesthesia Plan: General   Post-op Pain Management: Minimal or no pain anticipated   Induction: Intravenous  PONV Risk Score and Plan: 0 and Propofol infusion  Airway Management Planned: Nasal Cannula and Natural Airway  Additional Equipment:   Intra-op Plan:   Post-operative Plan:   Informed Consent: I have reviewed the patients History and Physical, chart, labs and discussed the procedure including the risks, benefits and alternatives for the proposed anesthesia with the patient or authorized representative who has indicated his/her understanding and acceptance.     Dental advisory given  Plan Discussed with: CRNA and Surgeon  Anesthesia Plan Comments:          Anesthesia Quick Evaluation

## 2023-05-14 NOTE — Anesthesia Postprocedure Evaluation (Signed)
Anesthesia Post Note  Patient: Terry Allen  Procedure(s) Performed: ESOPHAGOGASTRODUODENOSCOPY (EGD) WITH PROPOFOL ESOPHAGEAL DILATION  Patient location during evaluation: Phase II Anesthesia Type: General Level of consciousness: awake and alert and oriented Pain management: pain level controlled Vital Signs Assessment: post-procedure vital signs reviewed and stable Respiratory status: spontaneous breathing, nonlabored ventilation and respiratory function stable Cardiovascular status: blood pressure returned to baseline and stable Postop Assessment: no apparent nausea or vomiting Anesthetic complications: no  No notable events documented.   Last Vitals:  Vitals:   05/14/23 0958 05/14/23 1126  BP: (!) 147/78 (!) 166/78  Pulse: 71 70  Resp: 12 20  Temp: 36.7 C 36.9 C  SpO2: 100% 100%    Last Pain:  Vitals:   05/14/23 1152  TempSrc:   PainSc: 4                  Alrick Cubbage C Edie Darley

## 2023-05-14 NOTE — Interval H&P Note (Signed)
History and Physical Interval Note:  05/14/2023 11:13 AM  Terry Allen  has presented today for surgery, with the diagnosis of DYSPHAGIA.  The various methods of treatment have been discussed with the patient and family. After consideration of risks, benefits and other options for treatment, the patient has consented to  Procedure(s) with comments: ESOPHAGOGASTRODUODENOSCOPY (EGD) WITH PROPOFOL (N/A) - 11:15 AM, ASA 2 ESOPHAGEAL DILATION (N/A) as a surgical intervention.  The patient's history has been reviewed, patient examined, no change in status, stable for surgery.  I have reviewed the patient's chart and labs.  Questions were answered to the patient's satisfaction.     Katrinka Blazing Mayorga

## 2023-05-14 NOTE — Op Note (Signed)
[facility] Patient Name: Terry Allen Procedure Date: 05/14/2023 11:04 AM MRN: [id number] Date of Birth: 12-07-1940 Attending MD: Katrinka Blazing , , [id number] CSN: [id number] Age: 82 Admit Type: Outpatient Procedure:                Upper GI endoscopy Indications:              Dysphagia Providers:                Katrinka Blazing, Edrick Kins, RN, Zena Amos Referring MD:             Katrinka Blazing Medicines:                Monitored Anesthesia Care Complications:            No immediate complications. Estimated Blood Loss:     Estimated blood loss: none. Procedure:                Pre-Anesthesia Assessment:                           - Prior to the procedure, a History and Physical                            was performed, and patient medications, allergies                            and sensitivities were reviewed. The patient's                            tolerance of previous anesthesia was reviewed.                           - The risks and benefits of the procedure and the                            sedation options and risks were discussed with the                            patient. All questions were answered and informed                            consent was obtained.                           - ASA Grade Assessment: II - A patient with mild                            systemic disease.                           After obtaining informed consent, the endoscope was                            passed under direct vision. Throughout the                            procedure, the patient's blood pressure,  pulse, and                            oxygen saturations were monitored continuously. The                            [id number]-[id number] ([id number]) scope was introduced through the                            mouth, and advanced to the second part of duodenum.                            The upper GI endoscopy was accomplished without                            difficulty.  The patient tolerated the procedure                            well. 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 A guidewire was       placed and the scope was withdrawn. Dilation was performed with a Savary       dilator with moderate resistance at 18 mm. The dilation site was       examined following endoscope reinsertion and showed mild mucosal       disruption.      The stomach was normal.      The examined duodenum was normal. Impression:               - Web in the upper third of the esophagus. Dilated.                           - Normal stomach.                           - Normal examined duodenum.                           - No specimens collected. Moderate Sedation:      Per Anesthesia Care Recommendation:           - Discharge patient to home (ambulatory).                           - Resume previous diet.                           - Continue present medications.                           - Repeat EGD in 6 weeks.                           - Start omeprazole 40 mg qday                           -  Decrease famotidine to 20 mg at bedtime. Procedure Code(s):        --- Professional ---                           [phone], Esophagogastroduodenoscopy, flexible,                            transoral; with insertion of guide wire followed by                            passage of dilator(s) through esophagus over guide                            wire Diagnosis Code(s):        --- Professional ---                           Q39.4, Esophageal web                           R13.10, Dysphagia, unspecified CPT copyright 2022 [employer]. All rights reserved. The codes documented in this report are preliminary and upon coder review may  be revised to meet current compliance requirements. Katrinka Blazing, MD Katrinka Blazing,  05/14/2023 11:34:13 AM This  report has been signed electronically. Number of Addenda: 0

## 2023-05-14 NOTE — Progress Notes (Signed)
Please inform patient,  TSH levels elevated, Referral placed to endocrinology for further evaluation.  Advise to take synthroid medication on a empty Stomach each morning.   Repeat labs in 6 weeks fasting  Please follow up in 6 weeks for repeat blood work ONLY. You can come into the clinic Monday-Friday 8-11 am make sure you're fasting for labs.

## 2023-05-14 NOTE — Transfer of Care (Signed)
Immediate Anesthesia Transfer of Care Note  Patient: Terry Allen  Procedure(s) Performed: ESOPHAGOGASTRODUODENOSCOPY (EGD) WITH PROPOFOL ESOPHAGEAL DILATION  Patient Location: Endoscopy Unit  Anesthesia Type:General  Level of Consciousness: drowsy  Airway & Oxygen Therapy: Patient Spontanous Breathing  Post-op Assessment: Report given to RN and Post -op Vital signs reviewed and stable  Post vital signs: Reviewed and stable  Last Vitals:  Vitals Value Taken Time  BP 140/80   Temp 98   Pulse 65   Resp 16   SpO2 100     Last Pain:  Vitals:   05/14/23 1113  TempSrc:   PainSc: 0-No pain      Patients Stated Pain Goal: 4 (05/14/23 0958)  Complications: No notable events documented.

## 2023-05-19 ENCOUNTER — Encounter: Payer: Self-pay | Admitting: Internal Medicine

## 2023-05-19 ENCOUNTER — Ambulatory Visit (INDEPENDENT_AMBULATORY_CARE_PROVIDER_SITE_OTHER): Payer: Medicare HMO | Admitting: Internal Medicine

## 2023-05-19 VITALS — BP 151/76 | HR 79 | Ht 62.0 in | Wt 103.8 lb

## 2023-05-19 DIAGNOSIS — R030 Elevated blood-pressure reading, without diagnosis of hypertension: Secondary | ICD-10-CM

## 2023-05-19 DIAGNOSIS — K5901 Slow transit constipation: Secondary | ICD-10-CM

## 2023-05-19 DIAGNOSIS — E039 Hypothyroidism, unspecified: Secondary | ICD-10-CM

## 2023-05-19 MED ORDER — LEVOTHYROXINE SODIUM 75 MCG PO TABS
75.0000 ug | ORAL_TABLET | Freq: Every day | ORAL | 1 refills | Status: DC
Start: 2023-05-19 — End: 2023-08-13

## 2023-05-19 NOTE — Progress Notes (Signed)
Acute Office Visit  Subjective:    Patient ID: Terry Allen, female    DOB: March 30, 1941, 82 y.o.   MRN: [id number]  Chief Complaint  Patient presents with   Hyperthyroidism    Patient concerned about her thyroid being high     HPI Patient is in today for discussion of elevated TSH recently.  She is currently taking levothyroxine 50 mcg every other day and 75 mcg every other day.  She denies any recent weight gain.  Has mild fatigue, denies any tremors or palpitations.  Has history of chronic constipation.  Past Medical History:  Diagnosis Date   Acid reflux    Patient reports Barretts   Bulging lumbar disc    High cholesterol    Hypothyroidism    Nerve damage    Osteopenia    Pelvic pain in female    due to bladder sling    Past Surgical History:  Procedure Laterality Date   APPENDECTOMY  1960   BIOPSY  11/03/2019   Procedure: BIOPSY;  Surgeon: Malissa Hippo, MD;  Location: [facility];  Service: Endoscopy;;   BLADDER SUSPENSION     CATARACT EXTRACTION W/PHACO Right 06/07/2019   Procedure: CATARACT EXTRACTION PHACO AND INTRAOCULAR LENS PLACEMENT (IOC);  Surgeon: Fabio Pierce, MD;  Location: [facility];  Service: Ophthalmology;  Laterality: Right;  CDE: 10.87   CATARACT EXTRACTION W/PHACO Left 06/21/2019   Procedure: CATARACT EXTRACTION PHACO AND INTRAOCULAR LENS PLACEMENT (IOC);  Surgeon: Fabio Pierce, MD;  Location: [facility];  Service: Ophthalmology;  Laterality: Left;  CDE: 11.59   COLONOSCOPY N/A 11/03/2019   Procedure: COLONOSCOPY;  Surgeon: Malissa Hippo, MD;  Location: [facility];  Service: Endoscopy;  Laterality: N/A;  1245   ESOPHAGEAL DILATION N/A 05/27/2018   Procedure: ESOPHAGEAL DILATION;  Surgeon: Malissa Hippo, MD;  [facility]: [facility];  Service: Endoscopy;  Laterality: N/A;   ESOPHAGEAL DILATION N/A 02/12/2019   Procedure: ESOPHAGEAL DILATION;  Surgeon: Malissa Hippo, MD;  [facility]: [facility];  Service: Endoscopy;  Laterality: N/A;    ESOPHAGEAL DILATION N/A 02/14/2022   Procedure: ESOPHAGEAL DILATION;  Surgeon: Malissa Hippo, MD;  [facility]: [facility];  Service: Endoscopy;  Laterality: N/A;   ESOPHAGEAL DILATION N/A 02/27/2022   Procedure: ESOPHAGEAL DILATION;  Surgeon: Dolores Frame, MD;  [facility]: [facility];  Service: Gastroenterology;  Laterality: N/A;   ESOPHAGEAL DILATION N/A 05/14/2023   Procedure: ESOPHAGEAL DILATION;  Surgeon: Dolores Frame, MD;  [facility]: [facility];  Service: Gastroenterology;  Laterality: N/A;   ESOPHAGOGASTRODUODENOSCOPY N/A 12/21/2014   Procedure: ESOPHAGOGASTRODUODENOSCOPY (EGD);  Surgeon: Malissa Hippo, MD;  [facility]: [facility];  Service: Endoscopy;  Laterality: N/A;  210   ESOPHAGOGASTRODUODENOSCOPY N/A 06/16/2015   Procedure: ESOPHAGOGASTRODUODENOSCOPY (EGD);  Surgeon: Malissa Hippo, MD;  [facility]: [facility];  Service: Endoscopy;  Laterality: N/A;  1250   ESOPHAGOGASTRODUODENOSCOPY N/A 05/27/2018   Procedure: ESOPHAGOGASTRODUODENOSCOPY (EGD);  Surgeon: Malissa Hippo, MD;  [facility]: [facility];  Service: Endoscopy;  Laterality: N/A;  7:30   ESOPHAGOGASTRODUODENOSCOPY N/A 02/12/2019   Procedure: ESOPHAGOGASTRODUODENOSCOPY (EGD);  Surgeon: Malissa Hippo, MD;  [facility]: [facility];  Service: Endoscopy;  Laterality: N/A;   ESOPHAGOGASTRODUODENOSCOPY (EGD) WITH PROPOFOL N/A 02/14/2022   Procedure: ESOPHAGOGASTRODUODENOSCOPY (EGD) WITH PROPOFOL;  Surgeon: Malissa Hippo, MD;  [facility];  Service: Endoscopy;  Laterality: N/A;  920   ESOPHAGOGASTRODUODENOSCOPY (EGD) WITH PROPOFOL N/A 02/27/2022   Procedure: ESOPHAGOGASTRODUODENOSCOPY (EGD)  WITH PROPOFOL;  Surgeon: Marguerita Merles, Reuel Boom, MD;  [facility]: [facility];  Service: Gastroenterology;  Laterality: N/A;  1:15 Per Dr Levon Hedger asa 1   ESOPHAGOGASTRODUODENOSCOPY (EGD) WITH PROPOFOL N/A 05/14/2023   Procedure: ESOPHAGOGASTRODUODENOSCOPY (EGD) WITH PROPOFOL;  Surgeon: Dolores Frame, MD;  [facility]: [facility];  Service: Gastroenterology;  Laterality: N/A;  11:15 AM, ASA 2    Family History  Problem Relation Age of Onset   Breast cancer Mother    Heart disease Mother    Diabetes Mother    Cancer Mother        breast   Stroke Mother    Hypertension Mother    Lung cancer Father    Stroke Father    Cancer Father        oat cell cancer/lung   Kidney cancer Brother    Cancer Brother        renal cell cancer   Healthy Brother    Diabetes Son     Social History   Socioeconomic History   Marital status: Divorced    Spouse name: Not on file   Number of children: Not on file   Years of education: Not on file   Highest education level: Not on file  Occupational History   Not on file  Tobacco Use   Smoking status: Never    Passive exposure: Never   Smokeless tobacco: Never  Vaping Use   Vaping status: Never Used  Substance and Sexual Activity   Alcohol use: No    Alcohol/week: 0.0 standard drinks of alcohol   Drug use: No   Sexual activity: Never  Other Topics Concern   Not on file  Social History Narrative   Not on file   Social Determinants of Health   Financial Resource Strain: Not on file  Food Insecurity: Not on file  Transportation Needs: Not on file  Physical Activity: Not on file  Stress: Not on file  Social Connections: Not on file  Intimate Partner Violence: Not on file    Outpatient Medications Prior to Visit  Medication Sig Dispense Refill   acetaminophen (TYLENOL) 325 MG tablet Take 2 tablets (650 mg total) by mouth every 6 (six) hours as needed for mild pain (or Fever >/= 101).     albuterol (VENTOLIN HFA) 108 (90 Base) MCG/ACT inhaler Inhale 2 puffs into the lungs every 4 (four) hours as needed for wheezing or shortness of breath. 1 each 3   amitriptyline (ELAVIL) 50 MG tablet Take 50 mg by mouth at bedtime.   2   atorvastatin (LIPITOR) 40 MG tablet Take 40 mg by mouth at bedtime.      Calcium  Carb-Cholecalciferol (CALCIUM 600 + D PO) Take 1 tablet by mouth 2 (two) times a day.     estradiol (ESTRACE) 0.1 MG/GM vaginal cream Discard plastic applicator. Insert a blueberry size amount (approximately 1 gram) of cream on fingertip inside vagina at bedtime every night for 1 week then 2 nights per week for long term use. 30 g 3   famotidine (PEPCID) 20 MG tablet Take 1 tablet (20 mg total) by mouth daily. 90 tablet 3   gabapentin (NEURONTIN) 300 MG capsule Take 1 capsule (300 mg total) by mouth 3 (three) times daily.     magic mouthwash (lidocaine, diphenhydrAMINE, alum & mag hydroxide) suspension Swish and swallow 5 mLs 3 (three) times daily as needed for mouth pain (throat discomfort/pain). 360 mL 1   meclizine (ANTIVERT) 12.5 MG tablet Take  1 tablet (12.5 mg total) by mouth 3 (three) times daily as needed for dizziness. 30 tablet 0   omeprazole (PRILOSEC) 40 MG capsule Take 1 capsule (40 mg total) by mouth daily. 90 capsule 3   ondansetron (ZOFRAN-ODT) 4 MG disintegrating tablet Take 1 tablet (4 mg total) by mouth every 8 (eight) hours as needed for nausea or vomiting. 60 tablet 2   polyethylene glycol (MIRALAX) 17 g packet Take 17 g by mouth daily. 100 each 0   potassium chloride SA (KLOR-CON M) 20 MEQ tablet Take 1 tablet (20 mEq total) by mouth 2 (two) times daily. 4 tablet 0   Vibegron (GEMTESA) 75 MG TABS Take 1 tablet (75 mg total) by mouth daily. 30 tablet 11   levothyroxine (SYNTHROID) 50 MCG tablet Take 50 mcg by mouth every other day.     levothyroxine (SYNTHROID) 75 MCG tablet Take 75 mcg by mouth every 3 (three) days. Alternating with 50 mcg.     No facility-administered medications prior to visit.    No Known Allergies  Review of Systems  Constitutional:  Negative for chills and fever.  HENT:  Negative for congestion, sinus pressure, sinus pain and sore throat.   Eyes:  Negative for pain and discharge.  Respiratory:  Negative for cough and shortness of breath.    Cardiovascular:  Negative for chest pain and palpitations.  Gastrointestinal:  Positive for constipation. Negative for abdominal pain, diarrhea, nausea and vomiting.  Endocrine: Negative for polydipsia and polyuria.  Genitourinary:  Negative for dysuria and hematuria.  Musculoskeletal:  Negative for neck pain and neck stiffness.  Skin:  Negative for rash.  Neurological:  Negative for dizziness and weakness.  Psychiatric/Behavioral:  Negative for agitation and behavioral problems.        Objective:    Physical Exam Vitals reviewed.  Constitutional:      General: She is not in acute distress.    Appearance: She is not diaphoretic.  HENT:     Head: Normocephalic and atraumatic.     Nose: Nose normal.     Mouth/Throat:     Mouth: Mucous membranes are moist.  Eyes:     General: No scleral icterus.    Extraocular Movements: Extraocular movements intact.  Cardiovascular:     Rate and Rhythm: Normal rate and regular rhythm.     Heart sounds: Normal heart sounds. No murmur heard. Pulmonary:     Breath sounds: Normal breath sounds. No wheezing or rales.  Musculoskeletal:     Cervical back: Neck supple. No tenderness.     Right lower leg: No edema.     Left lower leg: No edema.  Skin:    General: Skin is warm.     Findings: No rash.  Neurological:     General: No focal deficit present.     Mental Status: She is alert and oriented to person, place, and time.  Psychiatric:        Mood and Affect: Mood normal.        Behavior: Behavior normal.     BP (!) 151/76 (BP Location: Left Arm, Patient Position: Sitting, Cuff Size: Normal)   Pulse 79   Ht 5\' 2"  (1.575 m)   Wt 103 lb 12.8 oz (47.1 kg)   SpO2 96%   BMI 18.99 kg/m  Wt Readings from Last 3 Encounters:  05/19/23 103 lb 12.8 oz (47.1 kg)  05/06/23 105 lb 0.6 oz (47.6 kg)  04/29/23 101 lb 12.8 oz (46.2 kg)  Assessment & Plan:   Problem List Items Addressed This Visit       Digestive   Slow transit  constipation    Needs to maintain adequate hydration Colace and Miralax PRN        Endocrine   Hypothyroidism - Primary    Lab Results  Component Value Date   TSH 17.300 (H) 05/13/2023   Increased dose of Levothyroxine to 75 mcg QD, was taking 50 mcg and 75 mcg on alternate days Recheck TSH and free T4 after 3 months      Relevant Medications   levothyroxine (SYNTHROID) 75 MCG tablet     Other   Elevated BP without diagnosis of hypertension    BP Readings from Last 1 Encounters:  05/19/23 (!) 151/76   Reports situational anxiety and component of whitecoat HTN Overall acceptable for her age if it is < 150/90 Advised DASH diet and moderate exercise/walking, at least 150 mins/week         Meds ordered this encounter  Medications   levothyroxine (SYNTHROID) 75 MCG tablet    Sig: Take 1 tablet (75 mcg total) by mouth daily.    Dispense:  30 tablet    Refill:  1    Please discontinue 50 mcg dose.     Anabel Halon, MD

## 2023-05-19 NOTE — Patient Instructions (Signed)
Please start taking Levothyroxine 75 mcg once daily instead of 50 mcg.  Please get thyroid function test done after 4 weeks.

## 2023-05-21 ENCOUNTER — Encounter (HOSPITAL_COMMUNITY): Payer: Self-pay | Admitting: Gastroenterology

## 2023-05-23 DIAGNOSIS — R030 Elevated blood-pressure reading, without diagnosis of hypertension: Secondary | ICD-10-CM | POA: Insufficient documentation

## 2023-05-23 NOTE — Assessment & Plan Note (Signed)
Needs to maintain adequate hydration Colace and Miralax PRN

## 2023-05-23 NOTE — Assessment & Plan Note (Signed)
BP Readings from Last 1 Encounters:  05/19/23 (!) 151/76   Reports situational anxiety and component of whitecoat HTN Overall acceptable for her age if it is < 150/90 Advised DASH diet and moderate exercise/walking, at least 150 mins/week

## 2023-05-23 NOTE — Assessment & Plan Note (Signed)
Lab Results  Component Value Date   TSH 17.300 (H) 05/13/2023   Increased dose of Levothyroxine to 75 mcg QD, was taking 50 mcg and 75 mcg on alternate days Recheck TSH and free T4 after 3 months

## 2023-06-04 ENCOUNTER — Encounter (INDEPENDENT_AMBULATORY_CARE_PROVIDER_SITE_OTHER): Payer: Self-pay | Admitting: *Deleted

## 2023-06-06 ENCOUNTER — Telehealth: Payer: Self-pay | Admitting: Family Medicine

## 2023-06-06 ENCOUNTER — Encounter: Payer: Self-pay | Admitting: Family Medicine

## 2023-06-06 ENCOUNTER — Ambulatory Visit (INDEPENDENT_AMBULATORY_CARE_PROVIDER_SITE_OTHER): Payer: Medicare HMO | Admitting: Family Medicine

## 2023-06-06 VITALS — BP 100/66 | HR 85 | Ht 62.0 in | Wt 103.0 lb

## 2023-06-06 DIAGNOSIS — N390 Urinary tract infection, site not specified: Secondary | ICD-10-CM | POA: Diagnosis not present

## 2023-06-06 DIAGNOSIS — R1084 Generalized abdominal pain: Secondary | ICD-10-CM

## 2023-06-06 DIAGNOSIS — N3090 Cystitis, unspecified without hematuria: Secondary | ICD-10-CM

## 2023-06-06 MED ORDER — OMEPRAZOLE 40 MG PO CPDR
40.0000 mg | DELAYED_RELEASE_CAPSULE | Freq: Every day | ORAL | 3 refills | Status: DC
Start: 2023-06-06 — End: 2023-10-03

## 2023-06-06 MED ORDER — SULFAMETHOXAZOLE-TRIMETHOPRIM 800-160 MG PO TABS
1.0000 | ORAL_TABLET | Freq: Two times a day (BID) | ORAL | 0 refills | Status: AC
Start: 2023-06-06 — End: 2023-06-11

## 2023-06-06 NOTE — Progress Notes (Unsigned)
Acute Office Visit  Subjective:    Patient ID: Terry Allen, female    DOB: February 17, 1941, 82 y.o.   MRN: [id number]  Chief Complaint  Patient presents with   Abdominal Pain    Pt reports feeling sick to her stomach for 4 days, pain is on the left lower quadrant.    HPI The patient presents today with complaints of generalized abdominal pain, primarily localized to the left lower abdomen. She denies experiencing fever, chills, urinary urgency or frequency, and lower back pain. Additionally, she denies nausea, vomiting, or changes in bowel habits. The pain does not radiate to the lower back. She also denies any loss of appetite or unintentional weight loss.  Past Medical History:  Diagnosis Date   Acid reflux    Patient reports Barretts   Bulging lumbar disc    High cholesterol    Hypothyroidism    Nerve damage    Osteopenia    Pelvic pain in female    due to bladder sling    Past Surgical History:  Procedure Laterality Date   APPENDECTOMY  1960   BIOPSY  11/03/2019   Procedure: BIOPSY;  Surgeon: Malissa Hippo, MD;  Location: [facility];  Service: Endoscopy;;   BLADDER SUSPENSION     CATARACT EXTRACTION W/PHACO Right 06/07/2019   Procedure: CATARACT EXTRACTION PHACO AND INTRAOCULAR LENS PLACEMENT (IOC);  Surgeon: Fabio Pierce, MD;  Location: [facility];  Service: Ophthalmology;  Laterality: Right;  CDE: 10.87   CATARACT EXTRACTION W/PHACO Left 06/21/2019   Procedure: CATARACT EXTRACTION PHACO AND INTRAOCULAR LENS PLACEMENT (IOC);  Surgeon: Fabio Pierce, MD;  Location: [facility];  Service: Ophthalmology;  Laterality: Left;  CDE: 11.59   COLONOSCOPY N/A 11/03/2019   Procedure: COLONOSCOPY;  Surgeon: Malissa Hippo, MD;  Location: [facility];  Service: Endoscopy;  Laterality: N/A;  1245   ESOPHAGEAL DILATION N/A 05/27/2018   Procedure: ESOPHAGEAL DILATION;  Surgeon: Malissa Hippo, MD;  Location: [facility];  Service: Endoscopy;  Laterality: N/A;   ESOPHAGEAL  DILATION N/A 02/12/2019   Procedure: ESOPHAGEAL DILATION;  Surgeon: Malissa Hippo, MD;  Location: [facility];  Service: Endoscopy;  Laterality: N/A;   ESOPHAGEAL DILATION N/A 02/14/2022   Procedure: ESOPHAGEAL DILATION;  Surgeon: Malissa Hippo, MD;  Location: [facility];  Service: Endoscopy;  Laterality: N/A;   ESOPHAGEAL DILATION N/A 02/27/2022   Procedure: ESOPHAGEAL DILATION;  Surgeon: Dolores Frame, MD;  Location: [facility];  Service: Gastroenterology;  Laterality: N/A;   ESOPHAGEAL DILATION N/A 05/14/2023   Procedure: ESOPHAGEAL DILATION;  Surgeon: Dolores Frame, MD;  Location: [facility];  Service: Gastroenterology;  Laterality: N/A;   ESOPHAGOGASTRODUODENOSCOPY N/A 12/21/2014   Procedure: ESOPHAGOGASTRODUODENOSCOPY (EGD);  Surgeon: Malissa Hippo, MD;  [facility]: [facility];  Service: Endoscopy;  Laterality: N/A;  210   ESOPHAGOGASTRODUODENOSCOPY N/A 06/16/2015   Procedure: ESOPHAGOGASTRODUODENOSCOPY (EGD);  Surgeon: Malissa Hippo, MD;  [facility]: [facility];  Service: Endoscopy;  Laterality: N/A;  1250   ESOPHAGOGASTRODUODENOSCOPY N/A 05/27/2018   Procedure: ESOPHAGOGASTRODUODENOSCOPY (EGD);  Surgeon: Malissa Hippo, MD;  [facility]: [facility];  Service: Endoscopy;  Laterality: N/A;  7:30   ESOPHAGOGASTRODUODENOSCOPY N/A 02/12/2019   Procedure: ESOPHAGOGASTRODUODENOSCOPY (EGD);  Surgeon: Malissa Hippo, MD;  [facility]: [facility];  Service: Endoscopy;  Laterality: N/A;   ESOPHAGOGASTRODUODENOSCOPY (EGD) WITH PROPOFOL N/A 02/14/2022   Procedure: ESOPHAGOGASTRODUODENOSCOPY (EGD) WITH PROPOFOL;  Surgeon: Malissa Hippo, MD;  [facility]: [facility];  Service: Endoscopy;  Laterality: N/A;  920   ESOPHAGOGASTRODUODENOSCOPY (EGD) WITH PROPOFOL N/A 02/27/2022   Procedure: ESOPHAGOGASTRODUODENOSCOPY (EGD) WITH PROPOFOL;  Surgeon: Dolores Frame, MD;  [facility]: [facility];  Service: Gastroenterology;  Laterality: N/A;  1:15 Per Dr Levon Hedger  asa 1   ESOPHAGOGASTRODUODENOSCOPY (EGD) WITH PROPOFOL N/A 05/14/2023   Procedure: ESOPHAGOGASTRODUODENOSCOPY (EGD) WITH PROPOFOL;  Surgeon: Dolores Frame, MD;  Location: [facility];  Service: Gastroenterology;  Laterality: N/A;  11:15 AM, ASA 2    Family History  Problem Relation Age of Onset   Breast cancer Mother    Heart disease Mother    Diabetes Mother    Cancer Mother        breast   Stroke Mother    Hypertension Mother    Lung cancer Father    Stroke Father    Cancer Father        oat cell cancer/lung   Kidney cancer Brother    Cancer Brother        renal cell cancer   Healthy Brother    Diabetes Son     Social History   Socioeconomic History   Marital status: Divorced    Spouse name: Not on file   Number of children: Not on file   Years of education: Not on file   Highest education level: Not on file  Occupational History   Not on file  Tobacco Use   Smoking status: Never    Passive exposure: Never   Smokeless tobacco: Never  Vaping Use   Vaping status: Never Used  Substance and Sexual Activity   Alcohol use: No    Alcohol/week: 0.0 standard drinks of alcohol   Drug use: No   Sexual activity: Never  Other Topics Concern   Not on file  Social History Narrative   Not on file   Social Determinants of Health   Financial Resource Strain: Not on file  Food Insecurity: Not on file  Transportation Needs: Not on file  Physical Activity: Not on file  Stress: Not on file  Social Connections: Not on file  Intimate Partner Violence: Not on file    Outpatient Medications Prior to Visit  Medication Sig Dispense Refill   acetaminophen (TYLENOL) 325 MG tablet Take 2 tablets (650 mg total) by mouth every 6 (six) hours as needed for mild pain (or Fever >/= 101).     albuterol (VENTOLIN HFA) 108 (90 Base) MCG/ACT inhaler Inhale 2 puffs into the lungs every 4 (four) hours as needed for wheezing or shortness of breath. 1 each 3   amitriptyline  (ELAVIL) 50 MG tablet Take 50 mg by mouth at bedtime.   2   atorvastatin (LIPITOR) 40 MG tablet Take 40 mg by mouth at bedtime.      Calcium Carb-Cholecalciferol (CALCIUM 600 + D PO) Take 1 tablet by mouth 2 (two) times a day.     estradiol (ESTRACE) 0.1 MG/GM vaginal cream Discard plastic applicator. Insert a blueberry size amount (approximately 1 gram) of cream on fingertip inside vagina at bedtime every night for 1 week then 2 nights per week for long term use. 30 g 3   famotidine (PEPCID) 20 MG tablet Take 1 tablet (20 mg total) by mouth daily. 90 tablet 3   gabapentin (NEURONTIN) 300 MG capsule Take 1 capsule (300 mg total) by mouth 3 (three) times daily.     levothyroxine (SYNTHROID) 75 MCG tablet Take 1 tablet (75 mcg total) by mouth daily. 30 tablet  1   magic mouthwash (lidocaine, diphenhydrAMINE, alum & mag hydroxide) suspension Swish and swallow 5 mLs 3 (three) times daily as needed for mouth pain (throat discomfort/pain). 360 mL 1   meclizine (ANTIVERT) 12.5 MG tablet Take 1 tablet (12.5 mg total) by mouth 3 (three) times daily as needed for dizziness. 30 tablet 0   ondansetron (ZOFRAN-ODT) 4 MG disintegrating tablet Take 1 tablet (4 mg total) by mouth every 8 (eight) hours as needed for nausea or vomiting. 60 tablet 2   polyethylene glycol (MIRALAX) 17 g packet Take 17 g by mouth daily. 100 each 0   potassium chloride SA (KLOR-CON M) 20 MEQ tablet Take 1 tablet (20 mEq total) by mouth 2 (two) times daily. 4 tablet 0   Vibegron (GEMTESA) 75 MG TABS Take 1 tablet (75 mg total) by mouth daily. 30 tablet 11   omeprazole (PRILOSEC) 40 MG capsule Take 1 capsule (40 mg total) by mouth daily. 90 capsule 3   No facility-administered medications prior to visit.    No Known Allergies  Review of Systems  Constitutional:  Negative for chills and fatigue.  Respiratory:  Negative for cough and chest tightness.   Gastrointestinal:  Positive for abdominal pain. Negative for abdominal distention,  anal bleeding, blood in stool, diarrhea, nausea, rectal pain and vomiting.  Neurological:  Negative for headaches.       Objective:    Physical Exam HENT:     Head: Normocephalic.     Mouth/Throat:     Mouth: Mucous membranes are moist.  Cardiovascular:     Rate and Rhythm: Normal rate.     Heart sounds: Normal heart sounds.  Pulmonary:     Effort: Pulmonary effort is normal.     Breath sounds: Normal breath sounds.  Abdominal:     General: There is abdominal bruit.     Tenderness: There is abdominal tenderness in the left lower quadrant. There is no right CVA tenderness or left CVA tenderness.  Neurological:     Mental Status: She is alert.     BP 100/66   Pulse 85   Ht 5\' 2"  (1.575 m)   Wt 103 lb (46.7 kg)   SpO2 96%   BMI 18.84 kg/m  Wt Readings from Last 3 Encounters:  06/06/23 103 lb (46.7 kg)  05/19/23 103 lb 12.8 oz (47.1 kg)  05/06/23 105 lb 0.6 oz (47.6 kg)       Assessment & Plan:  Recurrent UTI Assessment & Plan: The patient is currently under the care of urology and has a history of taking a daily low-dose of nitrofurantoin for UTI prophylaxis. She has a history of overactive bladder and is currently taking Gemtesa 75 mg daily. Urinalysis is positive for leukocytes, and she will be treated with Bactrim for 5 days.  Please ensure that the full course of antibiotics is completed as prescribed.  To help prevent future urinary tract infections (UTIs), consider the following practices:  Avoid Prolonged Urination: Do not hold urine for extended periods, as this can stretch the bladder and create an environment conducive to bacterial growth. Prompt Bladder Emptying: Empty your bladder as soon as you feel the urge. Post-Intercourse Hygiene: Empty your bladder soon after intercourse to reduce the risk of infection. Shower Instead of Bath: Opt for showers rather than baths to minimize bacterial exposure. Proper Wiping Technique: Wipe from front to back after  urinating and bowel movements to prevent the transfer of bacteria from the anal region to the vagina and  urethra. Hydration: Drink a full glass of water regularly to help flush bacteria from your system.   Orders: -     Urinalysis -     Sulfamethoxazole-Trimethoprim; Take 1 tablet by mouth 2 (two) times daily for 5 days.  Dispense: 10 tablet; Refill: 0  Generalized abdominal pain -     Urinalysis -     Lipase -     Omeprazole; Take 1 capsule (40 mg total) by mouth daily.  Dispense: 90 capsule; Refill: 3 -     [id number]+EGFR  Note: This chart has been completed using Engineer, civil (consulting) software, and while attempts have been made to ensure accuracy, certain words and phrases may not be transcribed as intended.    Gilmore Laroche, FNP

## 2023-06-06 NOTE — Telephone Encounter (Signed)
I called and left a detailed message for the patient, informing her that her urinalysis indicated a urinary tract infection (UTI). I also let her know that a prescription for Bactrim has been sent to her pharmacy to initiate therapy.

## 2023-06-06 NOTE — Patient Instructions (Addendum)
I appreciate the opportunity to provide care to you today!    Follow up:  1 week with pcp  Labs: please stop by the lab today to get your blood drawn (BMP, lipase)  Abdominal Pain Management: -Medications:Start taking omeprazole 40 mg daily. -Follow up with your pcp in 1 week. If your abdominal pain persists, we will consider obtaining imaging studies of your abdomen. -Ensure you are drinking at least 64 ounces of water daily to stay hydrated. Please adhere to a GERD-friendly diet to help manage your symptoms.  For managing GERD, I recommend the following lifestyle changes:  Avoid Certain Foods and Drinks: Limit or eliminate coffee, chocolate, onions, peppermint, spicy foods, carbonated beverages, citrus fruits, tomatoes, garlic, alcohol, and fatty foods such as bacon, burgers, sausages, steak, fried foods, and dairy products.  Recommended Foods: Increase your intake of high-fiber foods including whole grain cereals, oatmeal, brown rice, root vegetables, and non-citrus fruits. Opt for high-protein foods and healthy fats such as avocados, olive oil, nuts, and seeds.   Please continue to a heart-healthy diet and increase your physical activities. Try to exercise for at least five days a week.    It was a pleasure to see you and I look forward to continuing to work together on your health and well-being. Please do not hesitate to call the office if you need care or have questions about your care.  In case of emergency, please visit the Emergency Department for urgent care, or contact our clinic at [phone] to schedule an appointment. We're here to help you!   Have a wonderful day and week. With Gratitude, Gilmore Laroche MSN, FNP-BC

## 2023-06-07 LAB — BMP8+EGFR
BUN/Creatinine Ratio: 14 (ref 12–28)
BUN: 12 mg/dL (ref 8–27)
CO2: 28 mmol/L (ref 20–29)
Calcium: 9.9 mg/dL (ref 8.7–10.3)
Chloride: 96 mmol/L (ref 96–106)
Creatinine, Ser: 0.84 mg/dL (ref 0.57–1.00)
Glucose: 77 mg/dL (ref 70–99)
Potassium: 4.3 mmol/L (ref 3.5–5.2)
Sodium: 136 mmol/L (ref 134–144)
eGFR: 70 mL/min/{1.73_m2} (ref >59)

## 2023-06-07 NOTE — Assessment & Plan Note (Signed)
The patient is currently under the care of urology and has a history of taking a daily low-dose of nitrofurantoin for UTI prophylaxis. She has a history of overactive bladder and is currently taking Gemtesa 75 mg daily. Urinalysis is positive for leukocytes, and she will be treated with Bactrim for 5 days.  Please ensure that the full course of antibiotics is completed as prescribed.  To help prevent future urinary tract infections (UTIs), consider the following practices:  Avoid Prolonged Urination: Do not hold urine for extended periods, as this can stretch the bladder and create an environment conducive to bacterial growth. Prompt Bladder Emptying: Empty your bladder as soon as you feel the urge. Post-Intercourse Hygiene: Empty your bladder soon after intercourse to reduce the risk of infection. Shower Instead of Bath: Opt for showers rather than baths to minimize bacterial exposure. Proper Wiping Technique: Wipe from front to back after urinating and bowel movements to prevent the transfer of bacteria from the anal region to the vagina and urethra. Hydration: Drink a full glass of water regularly to help flush bacteria from your system.

## 2023-06-10 IMAGING — RF DG ESOPHAGUS
9 series · 13 of 24 positions shown · non-contrast
Comparison: 02/26/2019

CLINICAL DATA: Coughing while eating, pill dysphagia, mid chest
pain and nausea, on reflux medication, remote esophageal dilatation

EXAM:
ESOPHOGRAM / BARIUM SWALLOW / BARIUM TABLET STUDY
TECHNIQUE: Combined double contrast and single contrast examination performed
using effervescent crystals, thick barium liquid, and thin barium
liquid. The patient was observed with fluoroscopy swallowing a 13 mm
barium sulphate tablet.
FLUOROSCOPY:
Radiation Exposure Index (as provided by the fluoroscopic device):
34.2 mGy

[Series 1: cp_standard · 0.18mm/px · 2 of 150 frames shown (1 of 9)]
[frame 16/150]
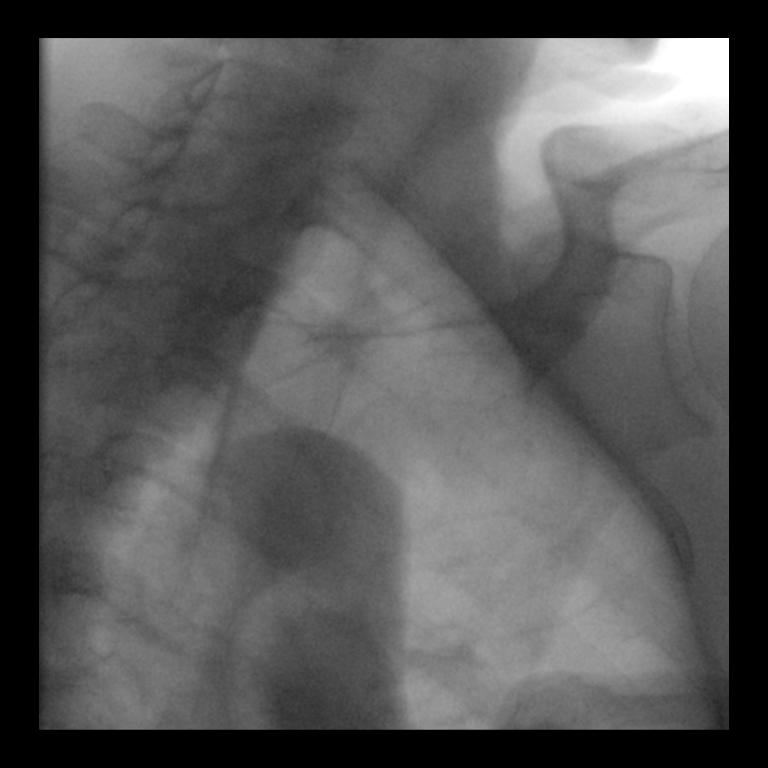
[frame 128/150]
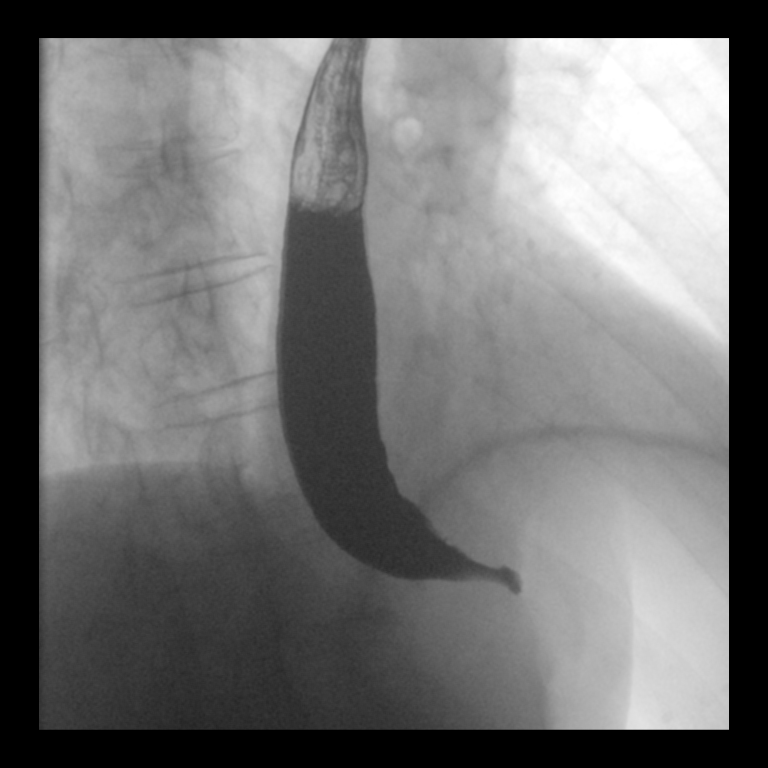

[Series 2: cp_standard · 0.19mm/px · 1 of 82 frames shown (2 of 9)]
[frame 42/82]
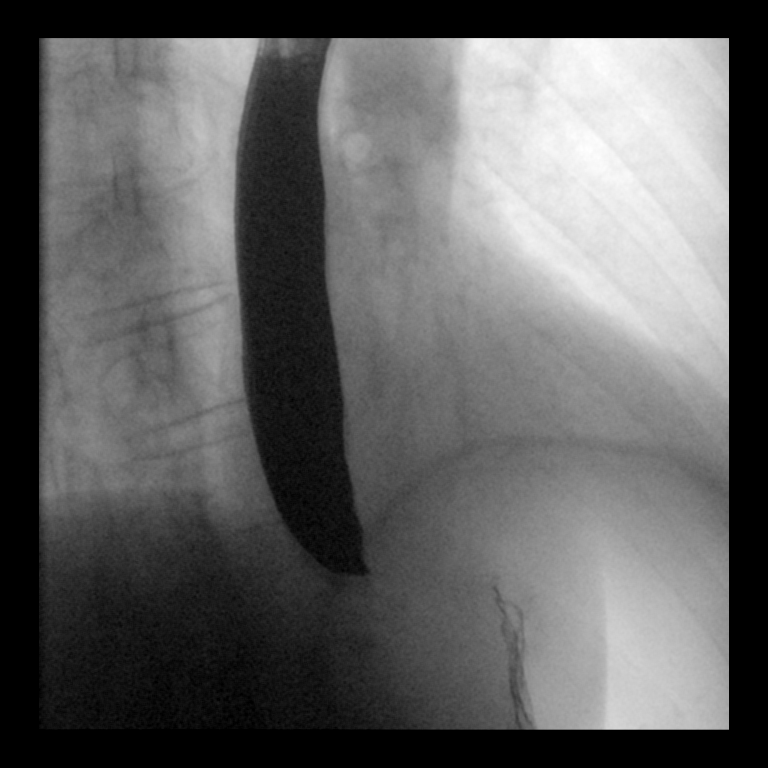

[Series 3: cp_standard · 0.19mm/px · 1 of 125 frames shown (3 of 9)]
[frame 63/125]
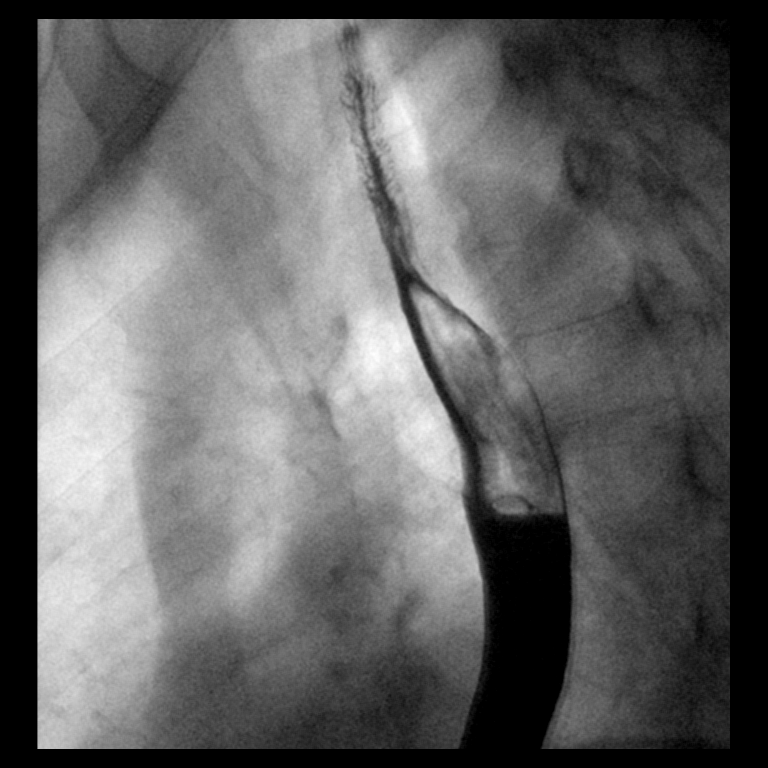

[Series 4: cp_standard · 0.17mm/px · 1 of 99 frames shown (4 of 9)]
[frame 15/99]
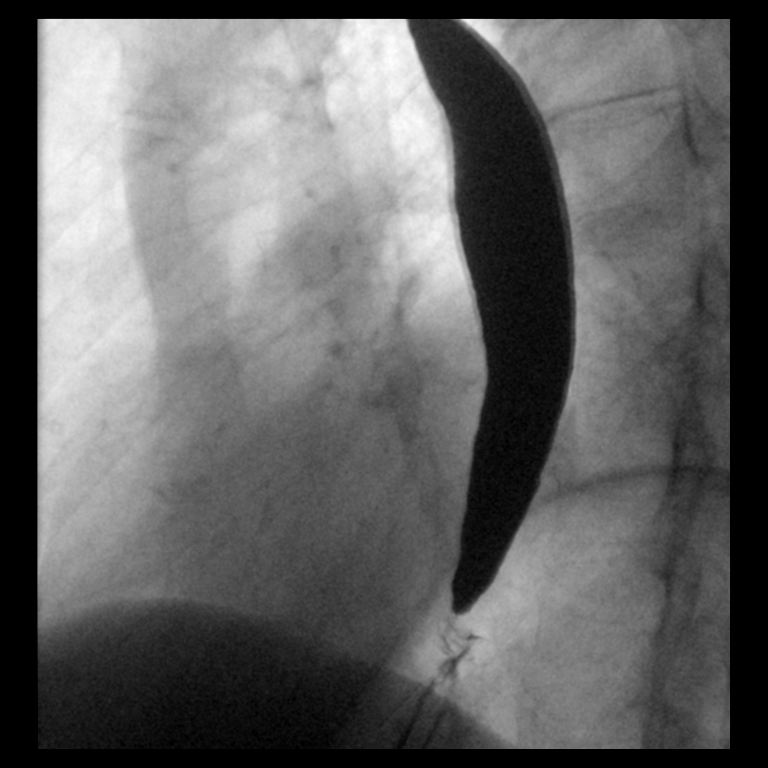

[Series 5: cp_standard · 0.28mm/px · 2 of 125 frames shown (5 of 9)]
[frame 19/125]
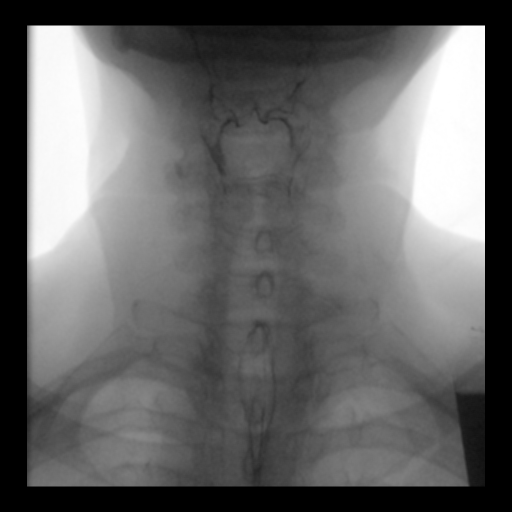
[frame 107/125]
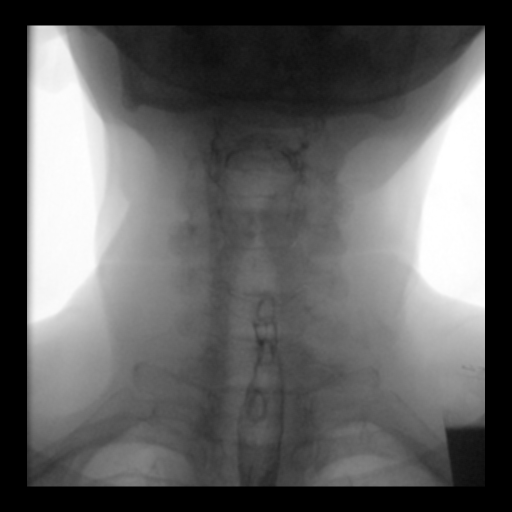

[Series 6: cp_standard · 0.27mm/px · 2 of 119 frames shown (6 of 9)]
[frame 1/119]
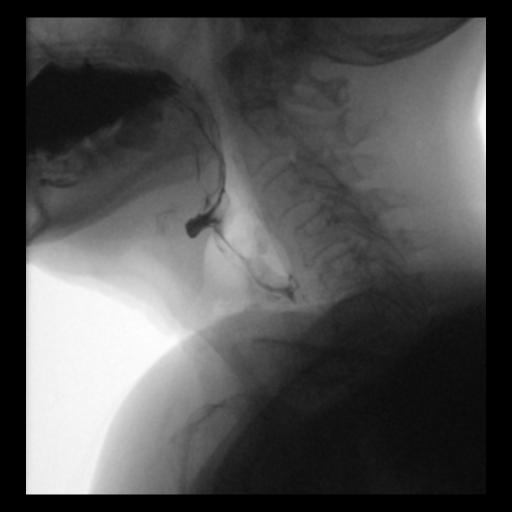
[frame 102/119]
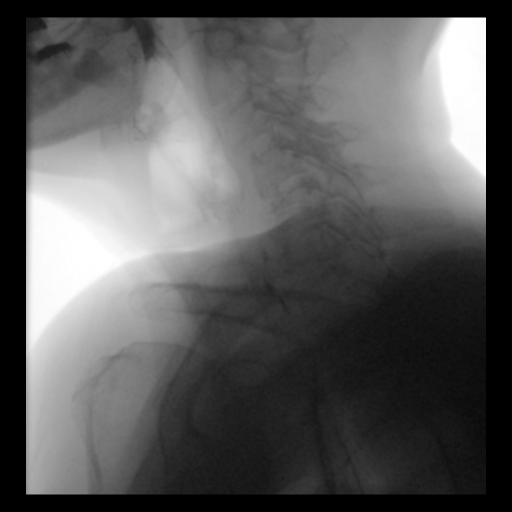

[Series 7: cp_standard · 0.26mm/px · 1 of 243 frames shown (7 of 9)]
[frame 207/243]
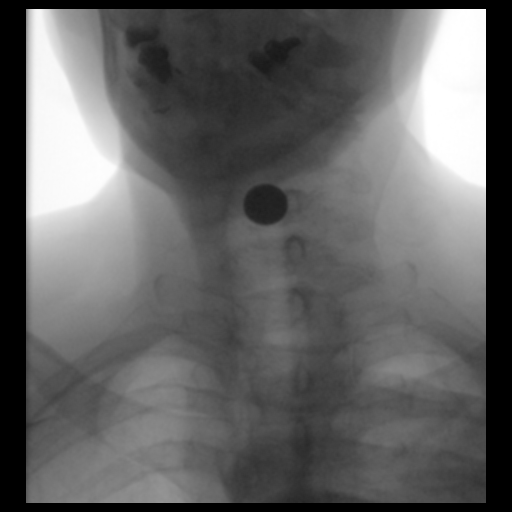

[Series 8: cp_standard · 0.27mm/px · 1 of 209 frames shown (8 of 9)]
[frame 105/209]
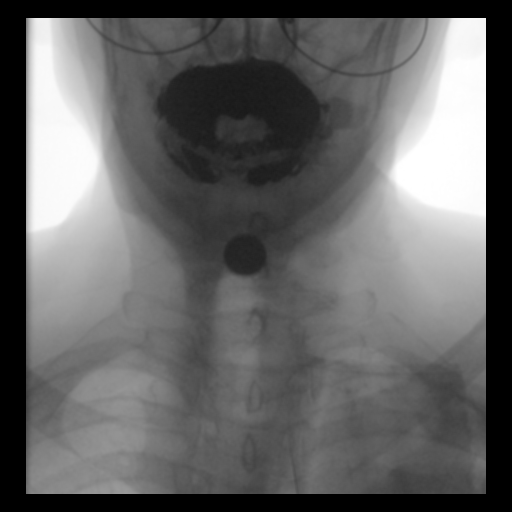

[Series 10: cp_standard · 0.27mm/px · 2 of 276 frames shown (9 of 9)]
[frame 42/276]
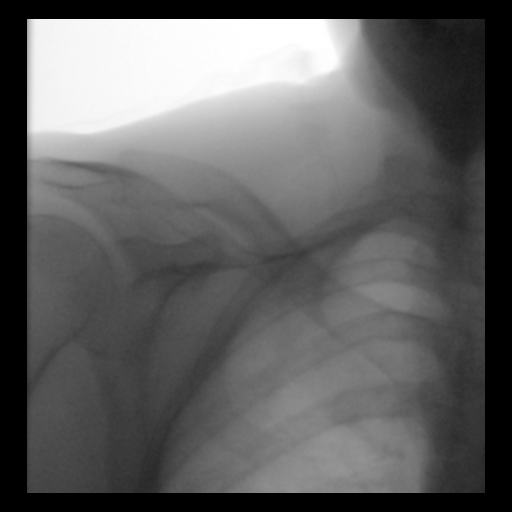
[frame 235/276]
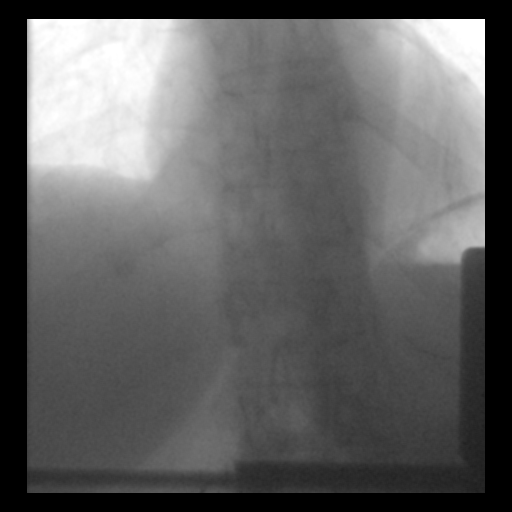

[13 of 24 positions shown; findings below may reference images not displayed]

FINDINGS: Esophageal distention: Normal without mass or stricture

Filling defects: Thin linear filling defect anterior wall cervical
esophagus consistent with mucosal web, increased in size since 5151,
significantly narrowing AP diameter of lumen. No additional filling
defects.

12.5 mm barium tablet: Obstructed at the mucosal web at the cervical
esophagus, dissolved in place at this level. Unable to pass pill
distally to assess patency of the remaining esophagus, though no
areas of narrowing are seen.

Motility:  Age-related dysmotility

Mucosa:  Smooth without irregularity or ulceration

Hypopharynx/cervical esophagus: Patient experience laryngeal
penetration and gross aspiration of barium while trying to swallow
the barium tablet. Spontaneous cough reflex was seen. Minimal
residual contrast along the anterior wall of the trachea, patient
instructed to continue coughing.

Hiatal hernia:  Absent

GE reflux:  Not witnessed during exam

Other:  N/A
IMPRESSION: Thin mucosal web at the cervical esophagus, increased in size since
5151, significantly narrowing the AP diameter of the cervical
esophagus and obstructing the 12.5 mm diameter barium tablet.

Laryngeal penetration and aspiration of contrast wall swallowing the
12.5 mm diameter barium tablet as it obstructed in the cervical
esophagus, with spontaneous cough reflex present.

## 2023-06-10 NOTE — Progress Notes (Signed)
Please inform the patient that her labs are stable

## 2023-06-16 ENCOUNTER — Ambulatory Visit (HOSPITAL_COMMUNITY)
Admission: RE | Admit: 2023-06-16 | Discharge: 2023-06-16 | Disposition: A | Discharge status: Home / Self Care | Payer: Medicare HMO | Source: Ambulatory Visit | Attending: Family Medicine | Admitting: Family Medicine

## 2023-06-16 ENCOUNTER — Ambulatory Visit: Payer: Medicare HMO | Admitting: Family Medicine

## 2023-06-16 DIAGNOSIS — J988 Other specified respiratory disorders: Secondary | ICD-10-CM | POA: Diagnosis not present

## 2023-06-16 DIAGNOSIS — E039 Hypothyroidism, unspecified: Secondary | ICD-10-CM

## 2023-06-16 DIAGNOSIS — E538 Deficiency of other specified B group vitamins: Secondary | ICD-10-CM

## 2023-06-16 DIAGNOSIS — B1920 Unspecified viral hepatitis C without hepatic coma: Secondary | ICD-10-CM

## 2023-06-16 DIAGNOSIS — R0602 Shortness of breath: Secondary | ICD-10-CM

## 2023-06-16 DIAGNOSIS — D229 Melanocytic nevi, unspecified: Secondary | ICD-10-CM

## 2023-06-16 DIAGNOSIS — R0989 Other specified symptoms and signs involving the circulatory and respiratory systems: Secondary | ICD-10-CM

## 2023-06-16 DIAGNOSIS — E559 Vitamin D deficiency, unspecified: Secondary | ICD-10-CM | POA: Diagnosis not present

## 2023-06-16 DIAGNOSIS — R079 Chest pain, unspecified: Secondary | ICD-10-CM | POA: Diagnosis not present

## 2023-06-16 DIAGNOSIS — D509 Iron deficiency anemia, unspecified: Secondary | ICD-10-CM

## 2023-06-16 MED ORDER — ALBUTEROL SULFATE HFA 108 (90 BASE) MCG/ACT IN AERS: 2.0000 | INHALATION_SPRAY | Freq: Four times a day (QID) | RESPIRATORY_TRACT | 2 refills | Status: AC | PRN

## 2023-06-16 MED ORDER — PREDNISONE 20 MG PO TABS
20.0000 mg | ORAL_TABLET | Freq: Two times a day (BID) | ORAL | 0 refills | Status: AC
Start: 2023-06-16 — End: 2023-06-21

## 2023-06-16 MED ORDER — AZITHROMYCIN 250 MG PO TABS
ORAL_TABLET | ORAL | 0 refills | Status: DC
Start: 2023-06-16 — End: 2023-09-15

## 2023-06-16 NOTE — Patient Instructions (Signed)

## 2023-06-16 NOTE — Assessment & Plan Note (Signed)
Cough with SOB - Chest xray ordered awaiting results will follow up.  Azithromycin 250 mg x 5 days Prednisone 20 mg x 5 days Discussed Symptomatic treatment, rest, increase oral fluid intake. Take OTC tylenol for pain or fever medications. Follow-up for worsening or persistent symptoms. Patient verbalizes understanding regarding plan of care and all questions answered

## 2023-06-16 NOTE — Assessment & Plan Note (Signed)
New Mole located on patient left shoulder Brown and light yellow in color  and soft - The edges of the mole are irregular, jagged, One half of the mole does not match the other half.  Referral placed to dermatology for further evaluation.

## 2023-06-16 NOTE — Progress Notes (Signed)
Patient Office Visit   Subjective   Patient ID: Terry Allen, female    DOB: 23-Oct-1940  Age: 82 y.o. MRN: [id number]  CC: No chief complaint on file.   HPI Terry Allen 82 year old female, presents the clinic for worsening SOB, cough and fatigue started a few days ago. She  has a past medical history of Acid reflux, Bulging lumbar disc, High cholesterol, Hypothyroidism, Nerve damage, Osteopenia, and Pelvic pain in female.  Patient complains of evaluation of cough. Patient describes symptoms of shortness of breath at rest, dry cough, fatigue, headache, malaise, myalgias, nausea, sore throat. Symptoms began a few days ago and are gradually worsening  since that time.Treatment thus far includes OTC analgesics/antipyretics: somewhat effective and oral antibiotics per medication orders: nitrofurantoin for UTI somewhat effective Past pulmonary history is significant for no history of pneumonia or bronchitis.     Outpatient Encounter Medications as of 06/16/2023  Medication Sig   albuterol (VENTOLIN HFA) 108 (90 Base) MCG/ACT inhaler Inhale 2 puffs into the lungs every 6 (six) hours as needed for wheezing or shortness of breath.   azithromycin (ZITHROMAX) 250 MG tablet Take 2 tablets on day 1, then 1 tablet daily on days 2 through 5   predniSONE (DELTASONE) 20 MG tablet Take 1 tablet (20 mg total) by mouth 2 (two) times daily with a meal for 5 days.   acetaminophen (TYLENOL) 325 MG tablet Take 2 tablets (650 mg total) by mouth every 6 (six) hours as needed for mild pain (or Fever >/= 101).   amitriptyline (ELAVIL) 50 MG tablet Take 50 mg by mouth at bedtime.    atorvastatin (LIPITOR) 40 MG tablet Take 40 mg by mouth at bedtime.    Calcium Carb-Cholecalciferol (CALCIUM 600 + D PO) Take 1 tablet by mouth 2 (two) times a day.   estradiol (ESTRACE) 0.1 MG/GM vaginal cream Discard plastic applicator. Insert a blueberry size amount (approximately 1 gram) of cream on fingertip inside  vagina at bedtime every night for 1 week then 2 nights per week for long term use.   famotidine (PEPCID) 20 MG tablet Take 1 tablet (20 mg total) by mouth daily.   gabapentin (NEURONTIN) 300 MG capsule Take 1 capsule (300 mg total) by mouth 3 (three) times daily.   levothyroxine (SYNTHROID) 75 MCG tablet Take 1 tablet (75 mcg total) by mouth daily.   magic mouthwash (lidocaine, diphenhydrAMINE, alum & mag hydroxide) suspension Swish and swallow 5 mLs 3 (three) times daily as needed for mouth pain (throat discomfort/pain).   meclizine (ANTIVERT) 12.5 MG tablet Take 1 tablet (12.5 mg total) by mouth 3 (three) times daily as needed for dizziness.   omeprazole (PRILOSEC) 40 MG capsule Take 1 capsule (40 mg total) by mouth daily.   ondansetron (ZOFRAN-ODT) 4 MG disintegrating tablet Take 1 tablet (4 mg total) by mouth every 8 (eight) hours as needed for nausea or vomiting.   polyethylene glycol (MIRALAX) 17 g packet Take 17 g by mouth daily.   potassium chloride SA (KLOR-CON M) 20 MEQ tablet Take 1 tablet (20 mEq total) by mouth 2 (two) times daily.   Vibegron (GEMTESA) 75 MG TABS Take 1 tablet (75 mg total) by mouth daily.   [DISCONTINUED] albuterol (VENTOLIN HFA) 108 (90 Base) MCG/ACT inhaler Inhale 2 puffs into the lungs every 4 (four) hours as needed for wheezing or shortness of breath.   No facility-administered encounter medications on file as of 06/16/2023.    Past Surgical History:  Procedure Laterality Date   APPENDECTOMY  1960   BIOPSY  11/03/2019   Procedure: BIOPSY;  Surgeon: Malissa Hippo, MD;  Location: [facility];  Service: Endoscopy;;   BLADDER SUSPENSION     CATARACT EXTRACTION W/PHACO Right 06/07/2019   Procedure: CATARACT EXTRACTION PHACO AND INTRAOCULAR LENS PLACEMENT (IOC);  Surgeon: Fabio Pierce, MD;  Location: [facility];  Service: Ophthalmology;  Laterality: Right;  CDE: 10.87   CATARACT EXTRACTION W/PHACO Left 06/21/2019   Procedure: CATARACT EXTRACTION PHACO AND  INTRAOCULAR LENS PLACEMENT (IOC);  Surgeon: Fabio Pierce, MD;  Location: [facility];  Service: Ophthalmology;  Laterality: Left;  CDE: 11.59   COLONOSCOPY N/A 11/03/2019   Procedure: COLONOSCOPY;  Surgeon: Malissa Hippo, MD;  [facility]: [facility];  Service: Endoscopy;  Laterality: N/A;  1245   ESOPHAGEAL DILATION N/A 05/27/2018   Procedure: ESOPHAGEAL DILATION;  Surgeon: Malissa Hippo, MD;  [facility]: [facility];  Service: Endoscopy;  Laterality: N/A;   ESOPHAGEAL DILATION N/A 02/12/2019   Procedure: ESOPHAGEAL DILATION;  Surgeon: Malissa Hippo, MD;  [facility]: [facility];  Service: Endoscopy;  Laterality: N/A;   ESOPHAGEAL DILATION N/A 02/14/2022   Procedure: ESOPHAGEAL DILATION;  Surgeon: Malissa Hippo, MD;  [facility]: [facility];  Service: Endoscopy;  Laterality: N/A;   ESOPHAGEAL DILATION N/A 02/27/2022   Procedure: ESOPHAGEAL DILATION;  Surgeon: Dolores Frame, MD;  [facility]: [facility];  Service: Gastroenterology;  Laterality: N/A;   ESOPHAGEAL DILATION N/A 05/14/2023   Procedure: ESOPHAGEAL DILATION;  Surgeon: Dolores Frame, MD;  [facility]: [facility];  Service: Gastroenterology;  Laterality: N/A;   ESOPHAGOGASTRODUODENOSCOPY N/A 12/21/2014   Procedure: ESOPHAGOGASTRODUODENOSCOPY (EGD);  Surgeon: Malissa Hippo, MD;  [facility];  Service: Endoscopy;  Laterality: N/A;  210   ESOPHAGOGASTRODUODENOSCOPY N/A 06/16/2015   Procedure: ESOPHAGOGASTRODUODENOSCOPY (EGD);  Surgeon: Malissa Hippo, MD;  [facility];  Service: Endoscopy;  Laterality: N/A;  1250   ESOPHAGOGASTRODUODENOSCOPY N/A 05/27/2018   Procedure: ESOPHAGOGASTRODUODENOSCOPY (EGD);  Surgeon: Malissa Hippo, MD;  [facility];  Service: Endoscopy;  Laterality: N/A;  7:30   ESOPHAGOGASTRODUODENOSCOPY N/A 02/12/2019   Procedure: ESOPHAGOGASTRODUODENOSCOPY (EGD);  Surgeon: Malissa Hippo, MD;  [facility];  Service: Endoscopy;  Laterality: N/A;    ESOPHAGOGASTRODUODENOSCOPY (EGD) WITH PROPOFOL N/A 02/14/2022   Procedure: ESOPHAGOGASTRODUODENOSCOPY (EGD) WITH PROPOFOL;  Surgeon: Malissa Hippo, MD;  [facility];  Service: Endoscopy;  Laterality: N/A;  920   ESOPHAGOGASTRODUODENOSCOPY (EGD) WITH PROPOFOL N/A 02/27/2022   Procedure: ESOPHAGOGASTRODUODENOSCOPY (EGD) WITH PROPOFOL;  Surgeon: Dolores Frame, MD;  [facility];  Service: Gastroenterology;  Laterality: N/A;  1:15 Per Dr Levon Hedger asa 1   ESOPHAGOGASTRODUODENOSCOPY (EGD) WITH PROPOFOL N/A 05/14/2023   Procedure: ESOPHAGOGASTRODUODENOSCOPY (EGD) WITH PROPOFOL;  Surgeon: Dolores Frame, MD;  [facility];  Service: Gastroenterology;  Laterality: N/A;  11:15 AM, ASA 2    Review of Systems  Constitutional:  Positive for chills and malaise/fatigue. Negative for fever.  HENT:  Positive for congestion.   Eyes:  Negative for blurred vision.  Respiratory:  Positive for cough and shortness of breath. Negative for hemoptysis, sputum production and wheezing.   Cardiovascular:  Negative for leg swelling.  Musculoskeletal:  Positive for joint pain and myalgias.  Skin:  Positive for itching.  Neurological:  Positive for dizziness and headaches.      Objective    There were no vitals taken for this visit.  Physical Exam Vitals reviewed.  Constitutional:      General: She is not in acute distress.    Appearance: Normal appearance. She is not ill-appearing, toxic-appearing or diaphoretic.  HENT:     Head: Normocephalic.  Eyes:     General:        Right eye: No discharge.        Left eye: No discharge.     Conjunctiva/sclera: Conjunctivae normal.  Cardiovascular:     Rate and Rhythm: Normal rate.     Pulses: Normal pulses.     Heart sounds: Normal heart sounds.  Pulmonary:     Effort: Respiratory distress present.     Breath sounds: Rhonchi present.  Chest:     Chest wall: Tenderness present.  Abdominal:     General: Bowel  sounds are normal.     Palpations: Abdomen is soft.     Tenderness: There is no abdominal tenderness. There is no guarding.  Musculoskeletal:     Cervical back: Normal range of motion.  Skin:    General: Skin is warm and dry.     Capillary Refill: Capillary refill takes less than 2 seconds.  Neurological:     Mental Status: She is alert and oriented to person, place, and time.     Gait: Gait normal.  Psychiatric:        Mood and Affect: Mood normal.       Assessment & Plan:  Change in skin mole Assessment & Plan: New Mole located on patient left shoulder Brown and light yellow in color  and soft - The edges of the mole are irregular, jagged, One half of the mole does not match the other half.  Referral placed to dermatology for further evaluation.    Orders: -     Ambulatory referral to Dermatology  Respiratory symptoms -     COVID-19, Flu A+B and RSV -     Hepatitis C antibody -     Azithromycin; Take 2 tablets on day 1, then 1 tablet daily on days 2 through 5  Dispense: 6 tablet; Refill: 0 -     DG Chest 2 View; Future  Hepatitis C virus infection without hepatic coma, unspecified chronicity -     Hepatitis C antibody  Iron deficiency anemia, unspecified iron deficiency anemia type -     Iron, TIBC and Ferritin Panel  Vitamin D deficiency -     VITAMIN D 25 Hydroxy (Vit-D Deficiency, Fractures)  Vitamin B12 deficiency -     B12 and Folate Panel -     Thyroid Peroxidase Antibodies (TPO) (REFL)  Hypothyroidism, unspecified type -     TSH + free T4  SOB (shortness of breath) -     DG Chest 2 View; Future -     predniSONE; Take 1 tablet (20 mg total) by mouth 2 (two) times daily with a meal for 5 days.  Dispense: 10 tablet; Refill: 0  Respiratory infection Assessment & Plan: Cough with SOB - Chest xray ordered awaiting results will follow up.  Azithromycin 250 mg x 5 days Prednisone 20 mg x 5 days Discussed Symptomatic treatment, rest, increase oral fluid  intake. Take OTC tylenol for pain or fever medications. Follow-up for worsening or persistent symptoms. Patient verbalizes understanding regarding plan of care and all questions answered    Other orders -     Albuterol Sulfate HFA; Inhale 2 puffs into the lungs every 6 (six) hours as needed for wheezing or shortness of  breath.  Dispense: 8 g; Refill: 2    Return in about 4 months (around 10/17/2023), or if symptoms worsen or fail to improve, for chronic follow-up.   Cruzita Lederer Newman Nip, FNP

## 2023-06-17 DIAGNOSIS — E559 Vitamin D deficiency, unspecified: Secondary | ICD-10-CM | POA: Diagnosis not present

## 2023-06-17 DIAGNOSIS — R0989 Other specified symptoms and signs involving the circulatory and respiratory systems: Secondary | ICD-10-CM | POA: Diagnosis not present

## 2023-06-17 DIAGNOSIS — D509 Iron deficiency anemia, unspecified: Secondary | ICD-10-CM | POA: Diagnosis not present

## 2023-06-17 DIAGNOSIS — E039 Hypothyroidism, unspecified: Secondary | ICD-10-CM | POA: Diagnosis not present

## 2023-06-17 DIAGNOSIS — B1920 Unspecified viral hepatitis C without hepatic coma: Secondary | ICD-10-CM | POA: Diagnosis not present

## 2023-06-17 DIAGNOSIS — E538 Deficiency of other specified B group vitamins: Secondary | ICD-10-CM | POA: Diagnosis not present

## 2023-06-17 LAB — TSH: TSH: 3.01 (ref 0.41–5.90)

## 2023-06-18 ENCOUNTER — Telehealth: Payer: Self-pay | Admitting: Family Medicine

## 2023-06-18 LAB — B12 AND FOLATE PANEL
Folate: 5.2 ng/mL (ref >3.0)
Vitamin B-12: >2000 pg/mL — ABNORMAL HIGH (ref 232–1245)

## 2023-06-18 LAB — COVID-19, FLU A+B AND RSV
Influenza A, NAA: NOT DETECTED
Influenza B, NAA: NOT DETECTED
RSV, NAA: NOT DETECTED
SARS-CoV-2, NAA: NOT DETECTED

## 2023-06-18 LAB — IRON,TIBC AND FERRITIN PANEL
Ferritin: 64 ng/mL (ref 15–150)
Iron Saturation: 18 % (ref 15–55)
Iron: 55 ug/dL (ref 27–139)
Total Iron Binding Capacity: 300 ug/dL (ref 250–450)
UIBC: 245 ug/dL (ref 118–369)

## 2023-06-18 LAB — TSH+FREE T4
Free T4: 1.15 ng/dL (ref 0.82–1.77)
Free T4: 1.14 ng/dL (ref 0.82–1.77)
TSH: 3.01 u[IU]/mL (ref 0.450–4.500)
TSH: 2.81 u[IU]/mL (ref 0.450–4.500)

## 2023-06-18 LAB — THYROID PEROXIDASE ANTIBODY: Thyroperoxidase Ab SerPl-aCnc: 17 [IU]/mL (ref 0–34)

## 2023-06-18 LAB — VITAMIN D 25 HYDROXY (VIT D DEFICIENCY, FRACTURES): Vit D, 25-Hydroxy: 46.4 ng/mL (ref 30.0–100.0)

## 2023-06-18 LAB — HEPATITIS C ANTIBODY: Hep C Virus Ab: NONREACTIVE

## 2023-06-18 NOTE — Telephone Encounter (Signed)
Pt came by office for covid results. Patient advised provider will call with results

## 2023-06-18 NOTE — Progress Notes (Signed)
Please inform patient,  Her labs are normal.

## 2023-06-18 NOTE — Telephone Encounter (Signed)
Terry Allen has not reviewed them yet, I will call patient when she does.

## 2023-06-18 NOTE — Telephone Encounter (Signed)
Patient called about test results ?

## 2023-06-26 ENCOUNTER — Encounter: Payer: Self-pay | Admitting: Internal Medicine

## 2023-07-14 DIAGNOSIS — D485 Neoplasm of uncertain behavior of skin: Secondary | ICD-10-CM | POA: Diagnosis not present

## 2023-07-14 DIAGNOSIS — L821 Other seborrheic keratosis: Secondary | ICD-10-CM | POA: Diagnosis not present

## 2023-07-14 DIAGNOSIS — L818 Other specified disorders of pigmentation: Secondary | ICD-10-CM | POA: Diagnosis not present

## 2023-07-17 ENCOUNTER — Emergency Department (HOSPITAL_COMMUNITY)
Admission: EM | Admit: 2023-07-17 | Discharge: 2023-07-18 | Disposition: A | Discharge status: Home / Self Care | Payer: Medicare HMO | Attending: Emergency Medicine | Admitting: Emergency Medicine

## 2023-07-17 ENCOUNTER — Encounter (HOSPITAL_COMMUNITY): Payer: Self-pay | Admitting: Radiology

## 2023-07-17 ENCOUNTER — Emergency Department (HOSPITAL_COMMUNITY): Payer: Medicare HMO

## 2023-07-17 ENCOUNTER — Other Ambulatory Visit: Payer: Self-pay

## 2023-07-17 DIAGNOSIS — M25461 Effusion, right knee: Secondary | ICD-10-CM | POA: Diagnosis not present

## 2023-07-17 DIAGNOSIS — E785 Hyperlipidemia, unspecified: Secondary | ICD-10-CM | POA: Insufficient documentation

## 2023-07-17 DIAGNOSIS — S82091A Other fracture of right patella, initial encounter for closed fracture: Secondary | ICD-10-CM | POA: Insufficient documentation

## 2023-07-17 DIAGNOSIS — W01198A Fall on same level from slipping, tripping and stumbling with subsequent striking against other object, initial encounter: Secondary | ICD-10-CM | POA: Diagnosis not present

## 2023-07-17 DIAGNOSIS — M85861 Other specified disorders of bone density and structure, right lower leg: Secondary | ICD-10-CM | POA: Diagnosis not present

## 2023-07-17 DIAGNOSIS — S8991XA Unspecified injury of right lower leg, initial encounter: Secondary | ICD-10-CM | POA: Diagnosis present

## 2023-07-17 DIAGNOSIS — S80919A Unspecified superficial injury of unspecified knee, initial encounter: Secondary | ICD-10-CM | POA: Diagnosis not present

## 2023-07-17 DIAGNOSIS — S82001A Unspecified fracture of right patella, initial encounter for closed fracture: Secondary | ICD-10-CM | POA: Insufficient documentation

## 2023-07-17 DIAGNOSIS — M1711 Unilateral primary osteoarthritis, right knee: Secondary | ICD-10-CM | POA: Diagnosis not present

## 2023-07-17 DIAGNOSIS — K219 Gastro-esophageal reflux disease without esophagitis: Secondary | ICD-10-CM | POA: Insufficient documentation

## 2023-07-17 DIAGNOSIS — S058X2A Other injuries of left eye and orbit, initial encounter: Secondary | ICD-10-CM | POA: Diagnosis present

## 2023-07-17 DIAGNOSIS — E039 Hypothyroidism, unspecified: Secondary | ICD-10-CM | POA: Diagnosis not present

## 2023-07-17 DIAGNOSIS — W19XXXA Unspecified fall, initial encounter: Secondary | ICD-10-CM | POA: Diagnosis not present

## 2023-07-17 DIAGNOSIS — R609 Edema, unspecified: Secondary | ICD-10-CM | POA: Diagnosis not present

## 2023-07-17 MED ORDER — ONDANSETRON 8 MG PO TBDP
8.0000 mg | ORAL_TABLET | Freq: Once | ORAL | Status: AC
  Administered 2023-07-17: 8 mg via ORAL
  Filled 2023-07-17: qty 1

## 2023-07-17 MED ORDER — OXYCODONE-ACETAMINOPHEN 5-325 MG PO TABS: 1.0000 | ORAL_TABLET | Freq: Four times a day (QID) | ORAL | 0 refills | Status: DC | PRN

## 2023-07-17 MED ORDER — MORPHINE SULFATE (PF) 4 MG/ML IV SOLN
4.0000 mg | Freq: Once | INTRAVENOUS | Status: AC
  Administered 2023-07-17: 4 mg via INTRAVENOUS
  Filled 2023-07-17: qty 1

## 2023-07-17 MED ORDER — HYDROMORPHONE HCL 1 MG/ML IJ SOLN
0.5000 mg | Freq: Once | INTRAMUSCULAR | Status: AC
  Administered 2023-07-17: 0.5 mg via INTRAVENOUS
  Filled 2023-07-17: qty 0.5

## 2023-07-17 NOTE — ED Triage Notes (Signed)
Pt had a mechanical fall earlier today and now has significant right knee swelling. Pt hit corner of left eye as well. Denies use of blood thinners.

## 2023-07-17 NOTE — ED Notes (Signed)
ED Provider at bedside. 

## 2023-07-17 NOTE — ED Notes (Signed)
16" Knee immobilizer applied to pt's right knee. Pt able to stand with walker and take 6 baby steps. Pt able to put light weight on right leg

## 2023-07-18 NOTE — ED Provider Notes (Signed)
[place] EMERGENCY DEPARTMENT AT [facility] Provider Note   CSN: [id number] Arrival date & time: 07/17/23  2129     History  Chief Complaint  Patient presents with   Knee Pain   Fall    Terry Allen is a 82 y.o. female.  Pt is a 82 yo female with pmhx significant for hypothyroidism, hld, and gerd.  Pt said she fell on her right knee today.  She was  unable to ambulate.  Pt did hit her face, but is not on blood thinners.  No loc.         Home Medications Prior to Admission medications   Medication Sig Start Date End Date Taking? Authorizing Provider  oxyCODONE-acetaminophen (PERCOCET/ROXICET) 5-325 MG tablet Take 1 tablet by mouth every 6 (six) hours as needed for severe pain (pain score 7-10). 07/17/23  Yes Jacalyn Lefevre, MD  acetaminophen (TYLENOL) 325 MG tablet Take 2 tablets (650 mg total) by mouth every 6 (six) hours as needed for mild pain (or Fever >/= 101). 04/28/23   Del Nigel Berthold, FNP  albuterol (VENTOLIN HFA) 108 (90 Base) MCG/ACT inhaler Inhale 2 puffs into the lungs every 6 (six) hours as needed for wheezing or shortness of breath. 06/16/23   Del Nigel Berthold, FNP  amitriptyline (ELAVIL) 50 MG tablet Take 50 mg by mouth at bedtime.  04/12/18   [provider]  atorvastatin (LIPITOR) 40 MG tablet Take 40 mg by mouth at bedtime.     [provider]  azithromycin (ZITHROMAX) 250 MG tablet Take 2 tablets on day 1, then 1 tablet daily on days 2 through 5 06/16/23   Del Orbe Polanco, [place], FNP  Calcium Carb-Cholecalciferol (CALCIUM 600 + D PO) Take 1 tablet by mouth 2 (two) times a day.    [provider]  estradiol (ESTRACE) 0.1 MG/GM vaginal cream Discard plastic applicator. Insert a blueberry size amount (approximately 1 gram) of cream on fingertip inside vagina at bedtime every night for 1 week then 2 nights per week for long term use. 02/28/23   Deliah Boston, Eleonore Chiquito, FNP  famotidine (PEPCID) 20 MG tablet Take 1  tablet (20 mg total) by mouth daily. 05/14/23   Dolores Frame, MD  gabapentin (NEURONTIN) 300 MG capsule Take 1 capsule (300 mg total) by mouth 3 (three) times daily. 04/01/22   Johnson, Clanford L, MD  levothyroxine (SYNTHROID) 75 MCG tablet Take 1 tablet (75 mcg total) by mouth daily. 05/19/23   Anabel Halon, MD  magic mouthwash (lidocaine, diphenhydrAMINE, alum & mag hydroxide) suspension Swish and swallow 5 mLs 3 (three) times daily as needed for mouth pain (throat discomfort/pain). 05/14/23   Dolores Frame, MD  meclizine (ANTIVERT) 12.5 MG tablet Take 1 tablet (12.5 mg total) by mouth 3 (three) times daily as needed for dizziness. 04/28/23   Del Nigel Berthold, FNP  omeprazole (PRILOSEC) 40 MG capsule Take 1 capsule (40 mg total) by mouth daily. 06/06/23   Gilmore Laroche, FNP  ondansetron (ZOFRAN-ODT) 4 MG disintegrating tablet Take 1 tablet (4 mg total) by mouth every 8 (eight) hours as needed for nausea or vomiting. 07/11/22   Dolores Frame, MD  polyethylene glycol (MIRALAX) 17 g packet Take 17 g by mouth daily. 02/11/23   Gardenia Phlegm, MD  potassium chloride SA (KLOR-CON M) 20 MEQ tablet Take 1 tablet (20 mEq total) by mouth 2 (two) times daily. 06/26/22   Vanetta Mulders, MD  Vibegron (GEMTESA) 75 MG  TABS Take 1 tablet (75 mg total) by mouth daily. 02/28/23   Donnita Falls, FNP      Allergies    Patient has no known allergies.    Review of Systems   Review of Systems  Musculoskeletal:        Right knee pain and swelling  All other systems reviewed and are negative.   Physical Exam Updated Vital Signs BP (!) 143/92   Pulse 80   Temp 99.1 F (37.3 C) (Oral)   Resp 20   Ht 5\' 2"  (1.575 m)   Wt 46.7 kg   SpO2 96%   BMI 18.84 kg/m  Physical Exam Vitals and nursing note reviewed.  Constitutional:      Appearance: Normal appearance.  HENT:     Head: Normocephalic and atraumatic.     Right Ear: External ear normal.     Left Ear:  External ear normal.     Nose: Nose normal.     Mouth/Throat:     Mouth: Mucous membranes are moist.     Pharynx: Oropharynx is clear.  Eyes:     Extraocular Movements: Extraocular movements intact.     Conjunctiva/sclera: Conjunctivae normal.     Pupils: Pupils are equal, round, and reactive to light.  Cardiovascular:     Rate and Rhythm: Normal rate and regular rhythm.     Pulses: Normal pulses.     Heart sounds: Normal heart sounds.  Pulmonary:     Effort: Pulmonary effort is normal.     Breath sounds: Normal breath sounds.  Abdominal:     General: Abdomen is flat. Bowel sounds are normal.     Palpations: Abdomen is soft.  Musculoskeletal:     Cervical back: Normal range of motion and neck supple.     Right knee: Swelling present. Decreased range of motion. Tenderness present.  Skin:    General: Skin is warm.     Capillary Refill: Capillary refill takes less than 2 seconds.  Neurological:     General: No focal deficit present.     Mental Status: She is alert and oriented to person, place, and time.  Psychiatric:        Mood and Affect: Mood normal.        Behavior: Behavior normal.     ED Results / Procedures / Treatments   Labs (all labs ordered are listed, but only abnormal results are displayed) Labs Reviewed - No data to display  EKG None  Radiology DG Knee Complete 4 Views Right  Result Date: 07/17/2023 CLINICAL DATA:  Fall and right knee pain. EXAM: RIGHT KNEE - COMPLETE 4+ VIEW COMPARISON:  None Available. FINDINGS: Nondisplaced fracture of the midportion of the patella. No other acute fracture. There is no dislocation. The bones are osteopenic. Moderate arthritic changes of the knee. There is a moderate suprapatellar effusion. The soft tissues are unremarkable. IMPRESSION: 1. Nondisplaced fracture of the patella. 2. Moderate suprapatellar effusion. Electronically Signed   By: Elgie Collard M.D.   On: 07/17/2023 23:16    Procedures Procedures     Medications Ordered in ED Medications  morphine (PF) 4 MG/ML injection 4 mg (4 mg Intravenous Given 07/17/23 2229)  ondansetron (ZOFRAN-ODT) disintegrating tablet 8 mg (8 mg Oral Given 07/17/23 2222)  HYDROmorphone (DILAUDID) injection 0.5 mg (0.5 mg Intravenous Given 07/17/23 2303)    ED Course/ Medical Decision Making/ A&P  Medical Decision Making Amount and/or Complexity of Data Reviewed Radiology: ordered.  Risk Prescription drug management.   This patient presents to the ED for concern of right knee pain, this involves an extensive number of treatment options, and is a complaint that carries with it a high risk of complications and morbidity.  The differential diagnosis includes fx, contusion, sprain   Co morbidities that complicate the patient evaluation  hypothyroidism, hld, and gerd   Additional history obtained:  Additional history obtained from epic chart review External records from outside source obtained and reviewed including EMS report  Imaging Studies ordered:  I ordered imaging studies including r knee  I independently visualized and interpreted imaging which showed  Nondisplaced fracture of the patella.  2. Moderate suprapatellar effusion.         I agree with the radiologist interpretation   Cardiac Monitoring:  The patient was maintained on a cardiac monitor.  I personally viewed and interpreted the cardiac monitored which showed an underlying rhythm of: nsr   Medicines ordered and prescription drug management:  I ordered medication including morphine/dilaudid  for pain  Reevaluation of the patient after these medicines showed that the patient improved I have reviewed the patients home medicines and have made adjustments as needed  Critical Interventions:  Pain control   Problem List / ED Course:  Right patella fx:  pt placed in a splint.  She was able to take a few steps.  She is given a walker.  She  is to f/u with ortho.  Return if worse.    Reevaluation:  After the interventions noted above, I reevaluated the patient and found that they have :improved   Social Determinants of Health:  Lives at home   Dispostion:  After consideration of the diagnostic results and the patients response to treatment, I feel that the patent would benefit from discharge with outpatient f/u.          Final Clinical Impression(s) / ED Diagnoses Final diagnoses:  Closed nondisplaced fracture of right patella, unspecified fracture morphology, initial encounter    Rx / DC Orders ED Discharge Orders          Ordered    For home use only DME 4 wheeled rolling walker with seat        07/17/23 2352    oxyCODONE-acetaminophen (PERCOCET/ROXICET) 5-325 MG tablet  Every 6 hours PRN        07/17/23 2352              Jacalyn Lefevre, MD 07/18/23 0017

## 2023-07-21 NOTE — Patient Instructions (Signed)

## 2023-07-22 ENCOUNTER — Encounter (INDEPENDENT_AMBULATORY_CARE_PROVIDER_SITE_OTHER): Payer: Medicare HMO | Admitting: Nurse Practitioner

## 2023-07-22 DIAGNOSIS — E039 Hypothyroidism, unspecified: Secondary | ICD-10-CM

## 2023-07-22 NOTE — Progress Notes (Unsigned)
Erroneous encounter

## 2023-07-24 ENCOUNTER — Encounter: Payer: Medicare HMO | Admitting: Orthopedic Surgery

## 2023-07-27 NOTE — Patient Instructions (Signed)
        Great to see you today.  I have refilled the medication(s) we provide.    - Please take medications as prescribed. - Follow up with your primary health provider if any health concerns arises. - If symptoms worsen please contact your primary care provider and/or visit the emergency department.  

## 2023-07-27 NOTE — Progress Notes (Unsigned)
   Established Patient Office Visit   Subjective  Patient ID: Terry Allen, female    DOB: December 08, 1940  Age: 82 y.o. MRN: [id number]  No chief complaint on file.   She  has a past medical history of Acid reflux, Bulging lumbar disc, High cholesterol, Hypothyroidism, Nerve damage, Osteopenia, and Pelvic pain in female.   Treatment for a nondisplaced fracture of the patella with moderate suprapatellar effusion typically focuses on immobilization, managing swelling, and allowing the bone to heal. Here's a typical treatment plan:  1. Immobilization: The knee should be immobilized with a knee brace or cast in full extension to prevent movement of the patella and allow the fracture to heal. Crutches may be provided to keep weight off the affected leg. 2. Pain and Swelling Management: RICE Protocol: Rest: Avoid putting weight on the injured leg. Ice: Apply ice packs to the knee for 15-20 minutes every 2-3 hours to reduce swelling. Compression: Use an elastic bandage (if not contraindicated) to control swelling. Elevation: Keep the leg elevated above heart level to reduce swelling. Over-the-counter pain relievers like acetaminophen or NSAIDs (ibuprofen) may help reduce pain and inflammation. 3. Orthopedic Follow-Up: Schedule a follow-up appointment with an orthopedic specialist within 1-2 weeks to monitor healing. The specialist may repeat imaging to ensure the fracture remains stable and healing progresses as expected.    ROS    Objective:     There were no vitals taken for this visit. {Vitals History (Optional):23777}  Physical Exam   No results found for any visits on 07/28/23.  The ASCVD Risk score (Arnett DK, et al., 2019) failed to calculate for the following reasons:   The 2019 ASCVD risk score is only valid for ages 45 to 69    Assessment & Plan:  There are no diagnoses linked to this encounter.  No follow-ups on file.   Cruzita Lederer Newman Nip, FNP

## 2023-07-28 ENCOUNTER — Ambulatory Visit: Payer: Self-pay | Admitting: Family Medicine

## 2023-08-13 ENCOUNTER — Other Ambulatory Visit: Payer: Self-pay | Admitting: Internal Medicine

## 2023-08-13 DIAGNOSIS — E039 Hypothyroidism, unspecified: Secondary | ICD-10-CM

## 2023-08-18 ENCOUNTER — Encounter: Payer: Self-pay | Admitting: Orthopedic Surgery

## 2023-08-18 ENCOUNTER — Ambulatory Visit: Payer: Medicare HMO | Admitting: Orthopedic Surgery

## 2023-08-18 ENCOUNTER — Other Ambulatory Visit (INDEPENDENT_AMBULATORY_CARE_PROVIDER_SITE_OTHER): Payer: Self-pay

## 2023-08-18 VITALS — BP 138/89 | HR 78 | Ht 62.0 in | Wt 103.0 lb

## 2023-08-18 DIAGNOSIS — S82024A Nondisplaced longitudinal fracture of right patella, initial encounter for closed fracture: Secondary | ICD-10-CM | POA: Diagnosis not present

## 2023-08-18 NOTE — Progress Notes (Signed)
Office Visit Note   Patient: Terry Allen           Date of Birth: Jun 18, 1941           MRN: [id number] Visit Date: 08/18/2023 Requested by: Rica Records, FNP [phone] S. [address] [place],  [place] [address] PCP: Rica Records, FNP   Assessment & Plan:   Encounter Diagnosis  Name Primary?   Closed nondisplaced longitudinal fracture of right patella, initial encounter Yes    No orders of the defined types were placed in this encounter.   4 weeks status post fracture right patella treated with brace.  Patient presented with no brace today and just a cane with full range of motion in her right knee  She has good quad strength so in that case we will use a cane full-time x-ray in 4 weeks   Subjective: Chief Complaint  Patient presents with   Knee Pain    Right patella fracture 07/17/23 states knee pain has gotten worse now, not wearing any support on knee     HPI: Larey Seat in a local retail store injured her right knee on November 7 she was seen in the ER.  X-ray showed a transverse fracture of the patella nondisplaced she was placed in a brace she is here for follow-up complains of medial knee pain as well              ROS: Nothing contributory   Images personally read and my interpretation : Outside imaging shows transverse fracture patella nondisplaced repeat images were performed today  Visit Diagnoses:  1. Closed nondisplaced longitudinal fracture of right patella, initial encounter      Follow-Up Instructions: Return in about 4 weeks (around 09/15/2023) for FOLLOW UP, FRACTURE CARE, XRAYS, RIGHT, KNEE.    Objective: Vital Signs: BP 138/89   Pulse 78   Ht 5\' 2"  (1.575 m)   Wt 103 lb (46.7 kg)   BMI 18.84 kg/m   Physical Exam Vitals and nursing note reviewed.  Constitutional:      Appearance: Normal appearance.  HENT:     Head: Normocephalic and atraumatic.  Eyes:     General: No scleral icterus.       Right eye: No discharge.         Left eye: No discharge.     Extraocular Movements: Extraocular movements intact.     Conjunctiva/sclera: Conjunctivae normal.     Pupils: Pupils are equal, round, and reactive to light.  Cardiovascular:     Rate and Rhythm: Normal rate.     Pulses: Normal pulses.  Skin:    General: Skin is warm and dry.     Capillary Refill: Capillary refill takes less than 2 seconds.  Neurological:     General: No focal deficit present.     Mental Status: She is alert and oriented to person, place, and time.  Psychiatric:        Mood and Affect: Mood normal.        Behavior: Behavior normal.        Thought Content: Thought content normal.        Judgment: Judgment normal.      Ortho Exam  Right knee full range of motion tenderness over the medial joint line tenderness is mild over the fracture line.  She has good quadricep strength to manual muscle testing  Specialty Comments:  No specialty comments available.  Imaging: No results found.   PMFS History:  Patient Active Problem List   Diagnosis Date Noted   Respiratory infection 06/16/2023   Change in skin mole 06/16/2023   Elevated BP without diagnosis of hypertension 05/23/2023   Cystitis 05/06/2023   Esophageal web 04/29/2023   Viral gastroenteritis 04/28/2023   Cystocele, midline 02/26/2023   Atrophic vaginitis 02/26/2023   Urinary frequency 02/26/2023   OAB (overactive bladder) 02/26/2023   Urinary urgency 02/26/2023   Slow transit constipation 02/11/2023   Insufficient sleep syndrome 02/05/2023   Chronic pain of both knees 01/16/2023   Nausea without vomiting 07/11/2022   Recurrent UTI 10/12/2021   Urge incontinence 10/12/2021   Esophageal dysphagia 05/20/2018   GERD (gastroesophageal reflux disease) 12/13/2014   Barrett's esophagus 12/13/2014   Hypothyroidism 12/13/2014   High cholesterol 12/13/2014   Past Medical History:  Diagnosis Date   Acid reflux    Patient reports Barretts   Bulging lumbar disc    High  cholesterol    Hypothyroidism    Nerve damage    Osteopenia    Pelvic pain in female    due to bladder sling    Family History  Problem Relation Age of Onset   Breast cancer Mother    Heart disease Mother    Diabetes Mother    Cancer Mother        breast   Stroke Mother    Hypertension Mother    Lung cancer Father    Stroke Father    Cancer Father        oat cell cancer/lung   Kidney cancer Brother    Cancer Brother        renal cell cancer   Healthy Brother    Diabetes Son     Past Surgical History:  Procedure Laterality Date   APPENDECTOMY  1960   BIOPSY  11/03/2019   Procedure: BIOPSY;  Surgeon: Malissa Hippo, MD;  [facility];  Service: Endoscopy;;   BLADDER SUSPENSION     CATARACT EXTRACTION W/PHACO Right 06/07/2019   Procedure: CATARACT EXTRACTION PHACO AND INTRAOCULAR LENS PLACEMENT (IOC);  Surgeon: Fabio Pierce, MD;  Location: [facility];  Service: Ophthalmology;  Laterality: Right;  CDE: 10.87   CATARACT EXTRACTION W/PHACO Left 06/21/2019   Procedure: CATARACT EXTRACTION PHACO AND INTRAOCULAR LENS PLACEMENT (IOC);  Surgeon: Fabio Pierce, MD;  Location: [facility];  Service: Ophthalmology;  Laterality: Left;  CDE: 11.59   COLONOSCOPY N/A 11/03/2019   Procedure: COLONOSCOPY;  Surgeon: Malissa Hippo, MD;  [facility];  Service: Endoscopy;  Laterality: N/A;  1245   ESOPHAGEAL DILATION N/A 05/27/2018   Procedure: ESOPHAGEAL DILATION;  Surgeon: Malissa Hippo, MD;  [facility];  Service: Endoscopy;  Laterality: N/A;   ESOPHAGEAL DILATION N/A 02/12/2019   Procedure: ESOPHAGEAL DILATION;  Surgeon: Malissa Hippo, MD;  [facility];  Service: Endoscopy;  Laterality: N/A;   ESOPHAGEAL DILATION N/A 02/14/2022   Procedure: ESOPHAGEAL DILATION;  Surgeon: Malissa Hippo, MD;  [facility];  Service: Endoscopy;  Laterality: N/A;   ESOPHAGEAL DILATION N/A 02/27/2022   Procedure: ESOPHAGEAL DILATION;  Surgeon: Dolores Frame, MD;  [facility];  Service: Gastroenterology;  Laterality: N/A;   ESOPHAGEAL DILATION N/A 05/14/2023   Procedure: ESOPHAGEAL DILATION;  Surgeon: Dolores Frame, MD;  [facility];  Service: Gastroenterology;  Laterality: N/A;   ESOPHAGOGASTRODUODENOSCOPY N/A 12/21/2014   Procedure: ESOPHAGOGASTRODUODENOSCOPY (EGD);  Surgeon: Malissa Hippo, MD;  [facility];  Service: Endoscopy;  Laterality: N/A;  210   ESOPHAGOGASTRODUODENOSCOPY N/A 06/16/2015   Procedure: ESOPHAGOGASTRODUODENOSCOPY (EGD);  Surgeon: Malissa Hippo, MD;  [facility];  Service: Endoscopy;  Laterality: N/A;  1250   ESOPHAGOGASTRODUODENOSCOPY N/A 05/27/2018   Procedure: ESOPHAGOGASTRODUODENOSCOPY (EGD);  Surgeon: Malissa Hippo, MD;  [facility];  Service: Endoscopy;  Laterality: N/A;  7:30   ESOPHAGOGASTRODUODENOSCOPY N/A 02/12/2019   Procedure: ESOPHAGOGASTRODUODENOSCOPY (EGD);  Surgeon: Malissa Hippo, MD;  [facility];  Service: Endoscopy;  Laterality: N/A;   ESOPHAGOGASTRODUODENOSCOPY (EGD) WITH PROPOFOL N/A 02/14/2022   Procedure: ESOPHAGOGASTRODUODENOSCOPY (EGD) WITH PROPOFOL;  Surgeon: Malissa Hippo, MD;  [facility];  Service: Endoscopy;  Laterality: N/A;  920   ESOPHAGOGASTRODUODENOSCOPY (EGD) WITH PROPOFOL N/A 02/27/2022   Procedure: ESOPHAGOGASTRODUODENOSCOPY (EGD) WITH PROPOFOL;  Surgeon: Dolores Frame, MD;  [facility];  Service: Gastroenterology;  Laterality: N/A;  1:15 Per Dr Levon Hedger asa 1   ESOPHAGOGASTRODUODENOSCOPY (EGD) WITH PROPOFOL N/A 05/14/2023   Procedure: ESOPHAGOGASTRODUODENOSCOPY (EGD) WITH PROPOFOL;  Surgeon: Dolores Frame, MD;  [facility];  Service: Gastroenterology;  Laterality: N/A;  11:15 AM, ASA 2   Social History   Occupational History   Not on file  Tobacco Use   Smoking status: Never    Passive exposure: Never   Smokeless tobacco:  Never  Vaping Use   Vaping status: Never Used  Substance and Sexual Activity   Alcohol use: No    Alcohol/week: 0.0 standard drinks of alcohol   Drug use: No   Sexual activity: Never

## 2023-08-18 NOTE — Patient Instructions (Signed)
Use cane until you come back

## 2023-08-21 NOTE — Progress Notes (Deleted)
Name: Terry Allen DOB: [birth year] MRN: [id number]  History of Present Illness: Terry Allen is a 82 y.o. female who presents today for follow up visit at [facility].  ***She is accompanied by ***. - GU history: 1. Recurrent UTI. - 12/11/2021: Cystoscopic evaluation by Dr. Pete Glatter showed "squamous metaplasia, chronic cystitis" with no other abnormal findings noted. - Previously took daily low dose Nitrofurantoin for UTI prophylaxis. 2. OAB with urinary frequency, nocturia, urgency, and urge incontinence. - Previously took Myrbetriq 50 mg daily. 3. Microscopic hematuria with negative workup. - 12/11/2021: No evidence of mesh extrusion into vagina or bladder per Dr. Laverle Hobby pelvic exam and cystoscopic evaluation. - 12/31/2022: CT abdomen/pelvis w/ contrast showed no stones, masses, or hydronephrosis.  4. Stress urinary incontinence.  - 2009: Pinnacle bladder sling and transobturator urethral sling implanted by Dr. Henderson Cloud.   - August 2011: Treated for vaginal mesh extrusion by Dr. Mia Creek.   5. Grade 2-3 cystocele. Per Dr. Laverle Hobby exam on 12/11/2021. 6. Vaginal atrophy.    Urine culture results in past 12 months: - 12/11/2022: Positive for E. coli - 02/28/2023: Negative - 05/06/2023: Positive for E. coli  At last visit on 02/28/2023: - Symptoms improved with Gemtesa.  ised the following: 1. Continue Gemtesa 75 mg daily. 2. Apply topical vaginal estrogen cream 2-3 nights per week (long term) for UTI prevention and vaginal atrophy. 3. Return in about 6 months (around 08/30/2023) for UA, PVR, & f/u with Evette Georges NP.  Since last visit: > 05/06/2023:  - Treated with Bactrim by PCP for suspected UTI. - UA with 2+ leukocytes and 2+ blood; nitrite negative. - Urine culture positive for E. Coli.  > 06/06/2023:  - Treated with Bactrim by PCP for suspected UTI. - UA with 1+ leukocytes and trace blood; nitrite negative. - Urine culture not  done.  Today: She reports ***  She reports {Blank multiple:19197::"improved","persistent / unchanged"} urinary ***frequency, ***nocturia, ***urgency, and ***urge incontinence. Voiding ***x/day and ***x/night on average. Leaking ***x/day on average; using *** ***pads / ***diapers per day on average.  She {Actions; denies-reports:120008} dysuria, gross hematuria, straining to void, or sensations of incomplete emptying.  She {HAS HAS QVZ:56387} been using vaginal estrogen cream at a frequency of *** time(s) per week. She {Actions; denies-reports:120008} vaginal pain, bleeding, discharge.   Fall Screening: Do you usually have a device to assist in your mobility? {yes/no:20286} ***cane / ***walker / ***wheelchair   Medications: Current Outpatient Medications  Medication Sig Dispense Refill   acetaminophen (TYLENOL) 325 MG tablet Take 2 tablets (650 mg total) by mouth every 6 (six) hours as needed for mild pain (or Fever >/= 101).     albuterol (VENTOLIN HFA) 108 (90 Base) MCG/ACT inhaler Inhale 2 puffs into the lungs every 6 (six) hours as needed for wheezing or shortness of breath. 8 g 2   amitriptyline (ELAVIL) 50 MG tablet Take 50 mg by mouth at bedtime.   2   AREXVY 120 MCG/0.5ML injection      atorvastatin (LIPITOR) 40 MG tablet Take 40 mg by mouth at bedtime.      azithromycin (ZITHROMAX) 250 MG tablet Take 2 tablets on day 1, then 1 tablet daily on days 2 through 5 6 tablet 0   Calcium Carb-Cholecalciferol (CALCIUM 600 + D PO) Take 1 tablet by mouth 2 (two) times a day.     estradiol (ESTRACE) 0.1 MG/GM vaginal cream Discard plastic applicator. Insert a blueberry size amount (approximately 1 gram) of cream on fingertip  inside vagina at bedtime every night for 1 week then 2 nights per week for long term use. 30 g 3   famotidine (PEPCID) 20 MG tablet Take 1 tablet (20 mg total) by mouth daily. 90 tablet 3   gabapentin (NEURONTIN) 300 MG capsule Take 1 capsule (300 mg total) by mouth 3  (three) times daily.     levothyroxine (SYNTHROID) 75 MCG tablet Take 1 tablet by mouth once daily 30 tablet 3   magic mouthwash (lidocaine, diphenhydrAMINE, alum & mag hydroxide) suspension Swish and swallow 5 mLs 3 (three) times daily as needed for mouth pain (throat discomfort/pain). 360 mL 1   meclizine (ANTIVERT) 12.5 MG tablet Take 1 tablet (12.5 mg total) by mouth 3 (three) times daily as needed for dizziness. 30 tablet 0   omeprazole (PRILOSEC) 40 MG capsule Take 1 capsule (40 mg total) by mouth daily. 90 capsule 3   ondansetron (ZOFRAN-ODT) 4 MG disintegrating tablet Take 1 tablet (4 mg total) by mouth every 8 (eight) hours as needed for nausea or vomiting. 60 tablet 2   oxyCODONE-acetaminophen (PERCOCET/ROXICET) 5-325 MG tablet Take 1 tablet by mouth every 6 (six) hours as needed for severe pain (pain score 7-10). 15 tablet 0   polyethylene glycol (MIRALAX) 17 g packet Take 17 g by mouth daily. 100 each 0   potassium chloride SA (KLOR-CON M) 20 MEQ tablet Take 1 tablet (20 mEq total) by mouth 2 (two) times daily. 4 tablet 0   tretinoin (RETIN-A) 0.025 % cream Apply topically at bedtime as needed.     Vibegron (GEMTESA) 75 MG TABS Take 1 tablet (75 mg total) by mouth daily. 30 tablet 11   No current facility-administered medications for this visit.    Allergies: No Known Allergies  Past Medical History:  Diagnosis Date   Acid reflux    Patient reports Barretts   Bulging lumbar disc    High cholesterol    Hypothyroidism    Nerve damage    Osteopenia    Pelvic pain in female    due to bladder sling   Past Surgical History:  Procedure Laterality Date   APPENDECTOMY  1960   BIOPSY  11/03/2019   Procedure: BIOPSY;  Surgeon: Malissa Hippo, MD;  [facility];  Service: Endoscopy;;   BLADDER SUSPENSION     CATARACT EXTRACTION W/PHACO Right 06/07/2019   Procedure: CATARACT EXTRACTION PHACO AND INTRAOCULAR LENS PLACEMENT (IOC);  Surgeon: Fabio Pierce, MD;  [facility];  Service: Ophthalmology;  Laterality: Right;  CDE: 10.87   CATARACT EXTRACTION W/PHACO Left 06/21/2019   Procedure: CATARACT EXTRACTION PHACO AND INTRAOCULAR LENS PLACEMENT (IOC);  Surgeon: Fabio Pierce, MD;  Location: [facility];  Service: Ophthalmology;  Laterality: Left;  CDE: 11.59   COLONOSCOPY N/A 11/03/2019   Procedure: COLONOSCOPY;  Surgeon: Malissa Hippo, MD;  [facility];  Service: Endoscopy;  Laterality: N/A;  1245   ESOPHAGEAL DILATION N/A 05/27/2018   Procedure: ESOPHAGEAL DILATION;  Surgeon: Malissa Hippo, MD;  [facility];  Service: Endoscopy;  Laterality: N/A;   ESOPHAGEAL DILATION N/A 02/12/2019   Procedure: ESOPHAGEAL DILATION;  Surgeon: Malissa Hippo, MD;  [facility];  Service: Endoscopy;  Laterality: N/A;   ESOPHAGEAL DILATION N/A 02/14/2022   Procedure: ESOPHAGEAL DILATION;  Surgeon: Malissa Hippo, MD;  [facility];  Service: Endoscopy;  Laterality: N/A;   ESOPHAGEAL DILATION N/A 02/27/2022   Procedure: ESOPHAGEAL DILATION;  Surgeon: Dolores Frame,  MD;  [facility];  Service: Gastroenterology;  Laterality: N/A;   ESOPHAGEAL DILATION N/A 05/14/2023   Procedure: ESOPHAGEAL DILATION;  Surgeon: Dolores Frame, MD;  [facility];  Service: Gastroenterology;  Laterality: N/A;   ESOPHAGOGASTRODUODENOSCOPY N/A 12/21/2014   Procedure: ESOPHAGOGASTRODUODENOSCOPY (EGD);  Surgeon: Malissa Hippo, MD;  [facility];  Service: Endoscopy;  Laterality: N/A;  210   ESOPHAGOGASTRODUODENOSCOPY N/A 06/16/2015   Procedure: ESOPHAGOGASTRODUODENOSCOPY (EGD);  Surgeon: Malissa Hippo, MD;  [facility];  Service: Endoscopy;  Laterality: N/A;  1250   ESOPHAGOGASTRODUODENOSCOPY N/A 05/27/2018   Procedure: ESOPHAGOGASTRODUODENOSCOPY (EGD);  Surgeon: Malissa Hippo, MD;  [facility];  Service: Endoscopy;  Laterality: N/A;  7:30   ESOPHAGOGASTRODUODENOSCOPY N/A  02/12/2019   Procedure: ESOPHAGOGASTRODUODENOSCOPY (EGD);  Surgeon: Malissa Hippo, MD;  [facility];  Service: Endoscopy;  Laterality: N/A;   ESOPHAGOGASTRODUODENOSCOPY (EGD) WITH PROPOFOL N/A 02/14/2022   Procedure: ESOPHAGOGASTRODUODENOSCOPY (EGD) WITH PROPOFOL;  Surgeon: Malissa Hippo, MD;  [facility];  Service: Endoscopy;  Laterality: N/A;  920   ESOPHAGOGASTRODUODENOSCOPY (EGD) WITH PROPOFOL N/A 02/27/2022   Procedure: ESOPHAGOGASTRODUODENOSCOPY (EGD) WITH PROPOFOL;  Surgeon: Dolores Frame, MD;  [facility];  Service: Gastroenterology;  Laterality: N/A;  1:15 Per Dr Levon Hedger asa 1   ESOPHAGOGASTRODUODENOSCOPY (EGD) WITH PROPOFOL N/A 05/14/2023   Procedure: ESOPHAGOGASTRODUODENOSCOPY (EGD) WITH PROPOFOL;  Surgeon: Dolores Frame, MD;  [facility];  Service: Gastroenterology;  Laterality: N/A;  11:15 AM, ASA 2   Family History  Problem Relation Age of Onset   Breast cancer Mother    Heart disease Mother    Diabetes Mother    Cancer Mother        breast   Stroke Mother    Hypertension Mother    Lung cancer Father    Stroke Father    Cancer Father        oat cell cancer/lung   Kidney cancer Brother    Cancer Brother        renal cell cancer   Healthy Brother    Diabetes Son    Social History   Socioeconomic History   Marital status: Divorced    Spouse name: Not on file   Number of children: Not on file   Years of education: Not on file   Highest education level: Not on file  Occupational History   Not on file  Tobacco Use   Smoking status: Never    Passive exposure: Never   Smokeless tobacco: Never  Vaping Use   Vaping status: Never Used  Substance and Sexual Activity   Alcohol use: No    Alcohol/week: 0.0 standard drinks of alcohol   Drug use: No   Sexual activity: Never  Other Topics Concern   Not on file  Social History Narrative   Not on file   Social Drivers of Health   Financial  Resource Strain: Not on file  Food Insecurity: Not on file  Transportation Needs: Not on file  Physical Activity: Not on file  Stress: Not on file  Social Connections: Not on file  Intimate Partner Violence: Not on file    Review of Systems*** Constitutional: Patient denies any unintentional weight loss or change in strength lntegumentary: Patient denies any rashes or pruritus Eyes: Patient {Actions; denies-reports:120008} dry eyes ENT: Patient {Actions; denies-reports:120008} dry mouth Cardiovascular: Patient denies chest pain or syncope Respiratory: Patient denies shortness of breath Gastrointestinal: Patient ***denies  nausea, vomiting, constipation, or diarrhea Musculoskeletal: Patient denies muscle cramps or weakness Neurologic: Patient denies convulsions or seizures Allergic/Immunologic: Patient denies recent allergic reaction(s) Hematologic/Lymphatic: Patient denies bleeding tendencies Endocrine: Patient denies heat/cold intolerance  GU: As per HPI.  OBJECTIVE There were no vitals filed for this visit. There is no height or weight on file to calculate BMI.  Physical Examination*** Constitutional: No obvious distress; patient is non-toxic appearing  Cardiovascular: No visible lower extremity edema.  Respiratory: The patient does not have audible wheezing/stridor; respirations do not appear labored  Gastrointestinal: Abdomen non-distended Musculoskeletal: Normal ROM of UEs  Skin: No obvious rashes/open sores  Neurologic: CN 2-12 grossly intact Psychiatric: Answered questions appropriately with normal affect  Hematologic/Lymphatic/Immunologic: No obvious bruises or sites of spontaneous bleeding  UA: ***negative *** WBC/hpf, *** RBC/hpf, *** bacteria ***with no evidence of UTI ***with no evidence of microscopic hematuria PVR: *** ml  ASSESSMENT No diagnosis found. *** We discussed the symptoms of overactive bladder (OAB), which include urinary urgency, frequency,  nocturia, with or without urge incontinence.   While we may not know the exact etiology of OAB, several risk factors can be identified.  - We discussed this patient's neurogenic risk factors for OAB-type symptoms including ***T2DM, ***nicotine use, ***.  - Likely exacerbated by ***diuretic use, ***caffeine intake, ***consumption of bladder irritants such as (acidic foods, spicy foods, alcohol).   We discussed the following management options in detail including potential benefits, risks, and side effects: Behavioral therapy: Modify fluid intake Decreasing bladder irritants (such as caffeine, acidic foods, spicy foods, alcohol) Urge suppression strategies Bladder retraining / timed voiding Double voiding Medication(s): - For anticholinergic medications, we discussed the potential side effects of anticholinergics including dry eyes, dry mouth, constipation, cognitive impairment and urinary retention.  - For beta-3 agonist medication, we discussed the risk for urinary retention and the potential side effect of elevated blood pressure specific to Myrbetriq (which is more likely to occur in individuals with uncontrolled hypertension).  For refractory cases: PTNS (posterior tibial nerve stimulation) Sacral neuromodulation trial (Medtronic lnterStim or Axonics implant) Bladder Botox injections In extreme cases, SP tube placement  ***In order to further evaluate urinary incontinence, She was instructed to complete a 3-day bladder diary.  ***Discussed recommendation for urodynamic testing, which was described in detail including risks such as bleeding, infection, organ/tissue/nerve damage.  She decided to proceed with *** ***work on behavioral modifications including ***minimizing caffeine intake and working on ***timed voiding.  Will plan for follow up in ***8 weeks / *** months / ***1 year or sooner if needed. Pt verbalized understanding and agreement. All questions were  answered.  PLAN Advised the following: *** ***. Minimize caffeine intake. ***. Work on timed voiding. ***. Urge suppression strategies. ***. Double/ triple voiding. ***. No follow-ups on file.  No orders of the defined types were placed in this encounter.   It has been explained that the patient is to follow regularly with their PCP in addition to all other providers involved in their care and to follow instructions provided by these respective offices. Patient advised to contact urology clinic if any urologic-pertaining questions, concerns, new symptoms or problems arise in the interim period.  There are no Patient Instructions on file for this visit.  Electronically signed by:  Donnita Falls, FNP   08/21/23    12:38 PM

## 2023-08-25 ENCOUNTER — Encounter: Payer: Medicare HMO | Admitting: Urology

## 2023-08-25 VITALS — BP 123/76 | HR 78

## 2023-08-25 DIAGNOSIS — R3129 Other microscopic hematuria: Secondary | ICD-10-CM

## 2023-08-25 DIAGNOSIS — N3941 Urge incontinence: Secondary | ICD-10-CM | POA: Diagnosis not present

## 2023-08-25 DIAGNOSIS — N952 Postmenopausal atrophic vaginitis: Secondary | ICD-10-CM | POA: Diagnosis not present

## 2023-08-25 DIAGNOSIS — N3281 Overactive bladder: Secondary | ICD-10-CM | POA: Diagnosis not present

## 2023-08-25 DIAGNOSIS — N390 Urinary tract infection, site not specified: Secondary | ICD-10-CM

## 2023-08-25 LAB — MICROSCOPIC EXAMINATION
Bacteria, UA: NONE SEEN
WBC, UA: NONE SEEN /[HPF] (ref 0–5)

## 2023-08-25 LAB — URINALYSIS, ROUTINE W REFLEX MICROSCOPIC
Bilirubin, UA: NEGATIVE
Glucose, UA: NEGATIVE
Ketones, UA: NEGATIVE
Leukocytes,UA: NEGATIVE
Nitrite, UA: NEGATIVE
Protein,UA: NEGATIVE
Specific Gravity, UA: 1.01 (ref 1.005–1.030)
Urobilinogen, Ur: 0.2 mg/dL (ref 0.2–1.0)
pH, UA: 7 (ref 5.0–7.5)

## 2023-08-26 NOTE — Progress Notes (Signed)
This encounter was created in error - please disregard. Patient elected to leave prior to seeing provider.

## 2023-08-28 ENCOUNTER — Encounter: Payer: Medicare HMO | Admitting: Orthopedic Surgery

## 2023-09-15 ENCOUNTER — Encounter: Payer: Self-pay | Admitting: Orthopedic Surgery

## 2023-09-15 ENCOUNTER — Other Ambulatory Visit (INDEPENDENT_AMBULATORY_CARE_PROVIDER_SITE_OTHER): Payer: Self-pay

## 2023-09-15 ENCOUNTER — Ambulatory Visit: Payer: Medicare HMO | Admitting: Orthopedic Surgery

## 2023-09-15 VITALS — BP 128/78 | HR 80

## 2023-09-15 DIAGNOSIS — S82024D Nondisplaced longitudinal fracture of right patella, subsequent encounter for closed fracture with routine healing: Secondary | ICD-10-CM | POA: Diagnosis not present

## 2023-09-15 DIAGNOSIS — S82026D Nondisplaced longitudinal fracture of unspecified patella, subsequent encounter for closed fracture with routine healing: Secondary | ICD-10-CM | POA: Insufficient documentation

## 2023-09-15 NOTE — Progress Notes (Signed)
   BP 128/78   Pulse 80   There is no height or weight on file to calculate BMI.  Chief Complaint  Patient presents with   Knee Injury    Right knee injury fracture 07/17/23 improving     Encounter Diagnosis  Name Primary?   Closed nondisplaced longitudinal fracture of right patella with routine healing, subsequent encounter 07/17/23 Yes    DOI/DOS/ Date: 07/17/23  Improved

## 2023-09-15 NOTE — Progress Notes (Signed)
  Subjective:     Patient ID: Roselie LULLA Marina, female   DOB: [birth year], 83 y.o.   MRN: [id number]  Fracture care follow-up 83 year old female transverse patella fracture 8 weeks ago  .  Patient nonoperatively     Review of Systems  All other systems reviewed and are negative.      Objective:   Physical Exam Musculoskeletal:     Comments: Full extension full flexion good quadriceps function  Neurological:     General: No focal deficit present.     Mental Status: She is alert and oriented to person, place, and time.  Psychiatric:        Mood and Affect: Mood normal.    DG Knee 1-2 Views Right Result Date: 09/15/2023 Imaging report right patella 2 views status post patella fracture 8 weeks ago Transverse fracture line visible but no separation Clinically patient asymptomatic Impression probably healed right patella fracture       Assessment:     Stable fracture x-ray looks good    Plan:     He is to normal activity

## 2023-09-18 ENCOUNTER — Encounter: Payer: Medicare HMO | Admitting: Orthopedic Surgery

## 2023-09-22 DIAGNOSIS — J069 Acute upper respiratory infection, unspecified: Secondary | ICD-10-CM | POA: Diagnosis not present

## 2023-09-22 DIAGNOSIS — N3001 Acute cystitis with hematuria: Secondary | ICD-10-CM | POA: Diagnosis not present

## 2023-09-22 DIAGNOSIS — R350 Frequency of micturition: Secondary | ICD-10-CM | POA: Diagnosis not present

## 2023-10-02 NOTE — Progress Notes (Signed)
Established Patient Office Visit   Subjective  Patient ID: Terry Allen, female    DOB: 25-May-1941  Age: 83 y.o. MRN: [id number]  Chief Complaint  Patient presents with   Acute Visit    Achy Pain from bottom of chest to lower rt. Abdomen, severe nausea x 4 wk +she states her sternum is sore    She  has a past medical history of Acid reflux, Bulging lumbar disc, High cholesterol, Hypothyroidism, Nerve damage, Osteopenia, and Pelvic pain in female.  Heartburn She complains of abdominal pain, chest pain, coughing and heartburn. This is a chronic problem. The problem occurs frequently. The problem has been gradually worsening. The heartburn duration is several minutes. The heartburn is located in the substernum. The heartburn is of moderate intensity. The heartburn wakes her from sleep. The heartburn limits her activity. The symptoms are aggravated by certain foods and caffeine. Associated symptoms include fatigue. Risk factors include caffeine use. She has tried an antacid for the symptoms. The treatment provided mild relief.    Review of Systems  Constitutional:  Positive for fatigue. Negative for fever.  Respiratory:  Positive for cough.   Cardiovascular:  Positive for chest pain.  Gastrointestinal:  Positive for abdominal pain and heartburn.  Genitourinary:  Positive for dysuria.      Objective:     BP 135/80   Pulse (!) 37   Ht 5\' 2"  (1.575 m)   Wt 107 lb 1.3 oz (48.6 kg)   SpO2 (!) 71%   BMI 19.59 kg/m  BP Readings from Last 3 Encounters:  10/03/23 135/80  09/15/23 128/78  08/25/23 123/76      Physical Exam Vitals reviewed.  Constitutional:      General: She is not in acute distress.    Appearance: Normal appearance. She is not ill-appearing, toxic-appearing or diaphoretic.  HENT:     Head: Normocephalic.  Eyes:     General:        Right eye: No discharge.        Left eye: No discharge.     Conjunctiva/sclera: Conjunctivae normal.  Cardiovascular:      Rate and Rhythm: Normal rate.     Pulses: Normal pulses.     Heart sounds: Normal heart sounds.  Pulmonary:     Effort: Pulmonary effort is normal. No respiratory distress.     Breath sounds: Normal breath sounds.  Abdominal:     General: Bowel sounds are normal.     Palpations: Abdomen is soft.     Tenderness: There is abdominal tenderness. There is guarding. There is no right CVA tenderness or left CVA tenderness.  Skin:    General: Skin is warm and dry.     Capillary Refill: Capillary refill takes less than 2 seconds.  Neurological:     Mental Status: She is alert.     Coordination: Coordination normal.     Gait: Gait normal.  Psychiatric:        Mood and Affect: Mood normal.        Behavior: Behavior normal.      No results found for any visits on 10/03/23.  The ASCVD Risk score (Arnett DK, et al., 2019) failed to calculate for the following reasons:   The 2019 ASCVD risk score is only valid for ages 55 to 58    Assessment & Plan:  Heartburn -     Sucralfate; Take 1 tablet (1 g total) by mouth 2 (two) times daily.  Dispense: 60 tablet; Refill:  3  Dysuria Assessment & Plan: Recent Urgent care visit with dx of UTI abx given and completed Patient still experiencing pain with urination Urinalysis, NuSwab and urine culture ordered- Awaiting results will follow up. May take OTC AZO for urinary pain relief. Discussed managing a UTI at home, it's important to drink plenty of water to help flush out bacteria from your urinary tract. Make sure to urinate frequently and avoid holding your urine. After using the bathroom, always wipe from front to back to prevent bacteria from spreading. Avoid irritants like caffeine, alcohol, spicy foods, and artificial sweeteners, as they can aggravate your bladder. Wearing loose, breathable clothing, especially cotton underwear, can help keep the area dry and reduce bacterial growth. If symptoms persist or worsen follow up.    Orders: -      Urine Culture  Gastroesophageal reflux disease, unspecified whether esophagitis present Assessment & Plan: Trial on Nexium 40 mg  daily and Carafate 1 mg twice daily Explained to patient lifestyle modifications, Eat smaller, more frequent meals, avoid trigger foods like spicy, fatty, or acidic items, and don't lie down for at least two to three hours after eating. Maintain a healthy weight, as excess weight can increase pressure on the stomach, and consider elevating the head of your bed to reduce nighttime symptoms.     Other orders -     Esomeprazole Magnesium; Take 1 capsule (40 mg total) by mouth daily before breakfast.  Dispense: 30 capsule; Refill: 3    Return in about 6 months (around 04/01/2024), or if symptoms worsen or fail to improve, for chronic follow-up.   Cruzita Lederer Newman Nip, FNP

## 2023-10-03 ENCOUNTER — Encounter: Payer: Self-pay | Admitting: Family Medicine

## 2023-10-03 ENCOUNTER — Ambulatory Visit (INDEPENDENT_AMBULATORY_CARE_PROVIDER_SITE_OTHER): Payer: Medicare HMO | Admitting: Family Medicine

## 2023-10-03 VITALS — BP 135/80 | HR 37 | Ht 62.0 in | Wt 107.1 lb

## 2023-10-03 DIAGNOSIS — K219 Gastro-esophageal reflux disease without esophagitis: Secondary | ICD-10-CM | POA: Diagnosis not present

## 2023-10-03 DIAGNOSIS — R12 Heartburn: Secondary | ICD-10-CM | POA: Diagnosis not present

## 2023-10-03 DIAGNOSIS — R300 Dysuria: Secondary | ICD-10-CM

## 2023-10-03 MED ORDER — ESOMEPRAZOLE MAGNESIUM 40 MG PO CPDR: 40.0000 mg | DELAYED_RELEASE_CAPSULE | Freq: Every day | ORAL | 3 refills | Status: DC

## 2023-10-03 MED ORDER — SUCRALFATE 1 G PO TABS: 1.0000 g | ORAL_TABLET | Freq: Two times a day (BID) | ORAL | 3 refills | Status: DC

## 2023-10-03 NOTE — Assessment & Plan Note (Signed)
Trial on Nexium 40 mg  daily and Carafate 1 mg twice daily Explained to patient lifestyle modifications, Eat smaller, more frequent meals, avoid trigger foods like spicy, fatty, or acidic items, and don't lie down for at least two to three hours after eating. Maintain a healthy weight, as excess weight can increase pressure on the stomach, and consider elevating the head of your bed to reduce nighttime symptoms.

## 2023-10-03 NOTE — Patient Instructions (Signed)

## 2023-10-03 NOTE — Assessment & Plan Note (Signed)
Recent Urgent care visit with dx of UTI abx given and completed Patient still experiencing pain with urination Urinalysis, NuSwab and urine culture ordered- Awaiting results will follow up. May take OTC AZO for urinary pain relief. Discussed managing a UTI at home, it's important to drink plenty of water to help flush out bacteria from your urinary tract. Make sure to urinate frequently and avoid holding your urine. After using the bathroom, always wipe from front to back to prevent bacteria from spreading. Avoid irritants like caffeine, alcohol, spicy foods, and artificial sweeteners, as they can aggravate your bladder. Wearing loose, breathable clothing, especially cotton underwear, can help keep the area dry and reduce bacterial growth. If symptoms persist or worsen follow up.

## 2023-10-05 LAB — URINE CULTURE: Organism ID, Bacteria: NO GROWTH

## 2023-10-16 ENCOUNTER — Ambulatory Visit (INDEPENDENT_AMBULATORY_CARE_PROVIDER_SITE_OTHER): Payer: Self-pay | Admitting: Internal Medicine

## 2023-10-16 ENCOUNTER — Encounter: Payer: Self-pay | Admitting: Internal Medicine

## 2023-10-16 VITALS — BP 136/63 | HR 73 | Ht 62.0 in | Wt 109.6 lb

## 2023-10-16 DIAGNOSIS — M51362 Other intervertebral disc degeneration, lumbar region with discogenic back pain and lower extremity pain: Secondary | ICD-10-CM | POA: Diagnosis not present

## 2023-10-16 MED ORDER — PREDNISONE 20 MG PO TABS: 20.0000 mg | ORAL_TABLET | Freq: Every day | ORAL | 0 refills | Status: DC

## 2023-10-16 MED ORDER — KETOROLAC TROMETHAMINE 60 MG/2ML IM SOLN
60.0000 mg | Freq: Once | INTRAMUSCULAR | Status: AC
  Administered 2023-10-16: 60 mg via INTRAMUSCULAR

## 2023-10-16 MED ORDER — METHYLPREDNISOLONE ACETATE 80 MG/ML IJ SUSP
80.0000 mg | Freq: Once | INTRAMUSCULAR | Status: AC
  Administered 2023-10-16: 80 mg via INTRAMUSCULAR

## 2023-10-16 NOTE — Assessment & Plan Note (Signed)
 Acute on chronic low back pain Previous x-ray of lumbar spine in chart showed DDD of lumbar spine with anterolisthesis L4-L5 She is currently taking gabapentin  300 mg 3 times daily Depo-Medrol  and Toradol  IM today Prednisone  40 mg once daily X 5 days Tylenol  as needed for mild to moderate pain Avoided muscle relaxer considering her age -if persistent discomfort, can consider low-dose muscle relaxer If persistent pain, will obtain imaging and PT evaluation

## 2023-10-16 NOTE — Patient Instructions (Signed)
 Please take Prednisone  as prescribed.  Okay to take Tylenol  as needed for pain.  Please continue taking Gabapentin  as prescribed.

## 2023-10-16 NOTE — Progress Notes (Signed)
 Acute Office Visit  Subjective:    Patient ID: Terry Allen, female    DOB: Jan 25, 1941, 83 y.o.   MRN: [id number]  Chief Complaint  Patient presents with   Care Management    Pt reports sx of pain from her back down, states she can hardly walk     HPI Patient is in today for complaint of acute on chronic low back pain, sharp, constant, radiating towards bilateral LE and worse with standing or walking.  She also reports right-sided knee pain, had closed nondisplaced fracture of right patella from a fall on 07/17/23, which has healed well.  She is currently taking gabapentin  300 mg 3 times daily and ibuprofen as needed for pain with mild relief.  Denies any numbness or tingling of the LE currently.  Past Medical History:  Diagnosis Date   Acid reflux    Patient reports Barretts   Bulging lumbar disc    High cholesterol    Hypothyroidism    Nerve damage    Osteopenia    Pelvic pain in female    due to bladder sling    Past Surgical History:  Procedure Laterality Date   APPENDECTOMY  1960   BIOPSY  11/03/2019   Procedure: BIOPSY;  Surgeon: Golda Claudis PENNER, MD;  [facility];  Service: Endoscopy;;   BLADDER SUSPENSION     CATARACT EXTRACTION W/PHACO Right 06/07/2019   Procedure: CATARACT EXTRACTION PHACO AND INTRAOCULAR LENS PLACEMENT (IOC);  Surgeon: Harrie Agent, MD;  Location: [facility];  Service: Ophthalmology;  Laterality: Right;  CDE: 10.87   CATARACT EXTRACTION W/PHACO Left 06/21/2019   Procedure: CATARACT EXTRACTION PHACO AND INTRAOCULAR LENS PLACEMENT (IOC);  Surgeon: Harrie Agent, MD;  Location: [facility];  Service: Ophthalmology;  Laterality: Left;  CDE: 11.59   COLONOSCOPY N/A 11/03/2019   Procedure: COLONOSCOPY;  Surgeon: Golda Claudis PENNER, MD;  [facility];  Service: Endoscopy;  Laterality: N/A;  1245   ESOPHAGEAL DILATION N/A 05/27/2018   Procedure: ESOPHAGEAL DILATION;  Surgeon: Golda Claudis PENNER, MD;  [facility];  Service:  Endoscopy;  Laterality: N/A;   ESOPHAGEAL DILATION N/A 02/12/2019   Procedure: ESOPHAGEAL DILATION;  Surgeon: Golda Claudis PENNER, MD;  [facility]: [facility];  Service: Endoscopy;  Laterality: N/A;   ESOPHAGEAL DILATION N/A 02/14/2022   Procedure: ESOPHAGEAL DILATION;  Surgeon: Golda Claudis PENNER, MD;  [facility];  Service: Endoscopy;  Laterality: N/A;   ESOPHAGEAL DILATION N/A 02/27/2022   Procedure: ESOPHAGEAL DILATION;  Surgeon: Eartha Angelia Sieving, MD;  [facility];  Service: Gastroenterology;  Laterality: N/A;   ESOPHAGEAL DILATION N/A 05/14/2023   Procedure: ESOPHAGEAL DILATION;  Surgeon: Eartha Angelia Sieving, MD;  [facility];  Service: Gastroenterology;  Laterality: N/A;   ESOPHAGOGASTRODUODENOSCOPY N/A 12/21/2014   Procedure: ESOPHAGOGASTRODUODENOSCOPY (EGD);  Surgeon: Claudis PENNER Golda, MD;  [facility];  Service: Endoscopy;  Laterality: N/A;  210   ESOPHAGOGASTRODUODENOSCOPY N/A 06/16/2015   Procedure: ESOPHAGOGASTRODUODENOSCOPY (EGD);  Surgeon: Claudis PENNER Golda, MD;  [facility];  Service: Endoscopy;  Laterality: N/A;  1250   ESOPHAGOGASTRODUODENOSCOPY N/A 05/27/2018   Procedure: ESOPHAGOGASTRODUODENOSCOPY (EGD);  Surgeon: Golda Claudis PENNER, MD;  [facility];  Service: Endoscopy;  Laterality: N/A;  7:30   ESOPHAGOGASTRODUODENOSCOPY N/A 02/12/2019   Procedure: ESOPHAGOGASTRODUODENOSCOPY (EGD);  Surgeon: Golda Claudis PENNER, MD;  [facility];  Service: Endoscopy;  Laterality: N/A;   ESOPHAGOGASTRODUODENOSCOPY (EGD) WITH PROPOFOL  N/A 02/14/2022  Procedure: ESOPHAGOGASTRODUODENOSCOPY (EGD) WITH PROPOFOL ;  Surgeon: Golda Claudis PENNER, MD;  [facility];  Service: Endoscopy;  Laterality: N/A;  920   ESOPHAGOGASTRODUODENOSCOPY (EGD) WITH PROPOFOL  N/A 02/27/2022   Procedure: ESOPHAGOGASTRODUODENOSCOPY (EGD) WITH PROPOFOL ;  Surgeon: Eartha Angelia Sieving, MD;  [facility];  Service:  Gastroenterology;  Laterality: N/A;  1:15 Per Dr Eartha asa 1   ESOPHAGOGASTRODUODENOSCOPY (EGD) WITH PROPOFOL  N/A 05/14/2023   Procedure: ESOPHAGOGASTRODUODENOSCOPY (EGD) WITH PROPOFOL ;  Surgeon: Eartha Angelia Sieving, MD;  [facility];  Service: Gastroenterology;  Laterality: N/A;  11:15 AM, ASA 2    Family History  Problem Relation Age of Onset   Breast cancer Mother    Heart disease Mother    Diabetes Mother    Cancer Mother        breast   Stroke Mother    Hypertension Mother    Lung cancer Father    Stroke Father    Cancer Father        oat cell cancer/lung   Kidney cancer Brother    Cancer Brother        renal cell cancer   Healthy Brother    Diabetes Son     Social History   Socioeconomic History   Marital status: Divorced    Spouse name: Not on file   Number of children: Not on file   Years of education: Not on file   Highest education level: Not on file  Occupational History   Not on file  Tobacco Use   Smoking status: Never    Passive exposure: Never   Smokeless tobacco: Never  Vaping Use   Vaping status: Never Used  Substance and Sexual Activity   Alcohol  use: No    Alcohol /week: 0.0 standard drinks of alcohol    Drug use: No   Sexual activity: Never  Other Topics Concern   Not on file  Social History Narrative   Not on file   Social Drivers of Health   Financial Resource Strain: Not on file  Food Insecurity: Not on file  Transportation Needs: Not on file  Physical Activity: Not on file  Stress: Not on file  Social Connections: Not on file  Intimate Partner Violence: Not on file    Outpatient Medications Prior to Visit  Medication Sig Dispense Refill   acetaminophen  (TYLENOL ) 325 MG tablet Take 2 tablets (650 mg total) by mouth every 6 (six) hours as needed for mild pain (or Fever >/= 101).     albuterol  (VENTOLIN  HFA) 108 (90 Base) MCG/ACT inhaler Inhale 2 puffs into the lungs every 6 (six) hours as needed for wheezing or  shortness of breath. 8 g 2   amitriptyline  (ELAVIL ) 50 MG tablet Take 50 mg by mouth at bedtime.   2   atorvastatin  (LIPITOR) 40 MG tablet Take 40 mg by mouth at bedtime.      Calcium  Carb-Cholecalciferol (CALCIUM  600 + D PO) Take 1 tablet by mouth 2 (two) times a day.     esomeprazole  (NEXIUM ) 40 MG capsule Take 1 capsule (40 mg total) by mouth daily before breakfast. 30 capsule 3   estradiol  (ESTRACE ) 0.1 MG/GM vaginal cream Discard plastic applicator. Insert a blueberry size amount (approximately 1 gram) of cream on fingertip inside vagina at bedtime every night for 1 week then 2 nights per week for long term use. 30 g 3   gabapentin  (NEURONTIN ) 300 MG capsule Take 1 capsule (300 mg total) by mouth 3 (three) times daily.  levothyroxine  (SYNTHROID ) 75 MCG tablet Take 1 tablet by mouth once daily 30 tablet 3   magic mouthwash (lidocaine , diphenhydrAMINE , alum & mag hydroxide) suspension Swish and swallow 5 mLs 3 (three) times daily as needed for mouth pain (throat discomfort/pain). 360 mL 1   meclizine  (ANTIVERT ) 12.5 MG tablet Take 1 tablet (12.5 mg total) by mouth 3 (three) times daily as needed for dizziness. 30 tablet 0   ondansetron  (ZOFRAN -ODT) 4 MG disintegrating tablet Take 1 tablet (4 mg total) by mouth every 8 (eight) hours as needed for nausea or vomiting. 60 tablet 2   polyethylene glycol (MIRALAX ) 17 g packet Take 17 g by mouth daily. 100 each 0   potassium chloride  SA (KLOR-CON  M) 20 MEQ tablet Take 1 tablet (20 mEq total) by mouth 2 (two) times daily. 4 tablet 0   sucralfate  (CARAFATE ) 1 g tablet Take 1 tablet (1 g total) by mouth 2 (two) times daily. 60 tablet 3   tretinoin (RETIN-A) 0.025 % cream Apply topically at bedtime as needed.     Vibegron  (GEMTESA ) 75 MG TABS Take 1 tablet (75 mg total) by mouth daily. 30 tablet 11   AREXVY 120 MCG/0.5ML injection      oxyCODONE -acetaminophen  (PERCOCET/ROXICET) 5-325 MG tablet Take 1 tablet by mouth every 6 (six) hours as needed for  severe pain (pain score 7-10). 15 tablet 0   No facility-administered medications prior to visit.    No Known Allergies  Review of Systems  Constitutional:  Negative for chills and fever.  HENT:  Negative for congestion, sinus pressure, sinus pain and sore throat.   Eyes:  Negative for pain and discharge.  Respiratory:  Negative for cough and shortness of breath.   Cardiovascular:  Negative for chest pain and palpitations.  Gastrointestinal:  Negative for abdominal pain, diarrhea, nausea and vomiting.  Endocrine: Negative for polydipsia and polyuria.  Genitourinary:  Negative for dysuria and hematuria.  Musculoskeletal:  Positive for arthralgias and back pain. Negative for neck pain and neck stiffness.  Skin:  Negative for rash.  Neurological:  Negative for dizziness and weakness.  Psychiatric/Behavioral:  Negative for agitation and behavioral problems.        Objective:    Physical Exam Vitals reviewed.  Constitutional:      General: She is not in acute distress.    Appearance: She is not diaphoretic.  HENT:     Head: Normocephalic and atraumatic.     Nose: Nose normal.     Mouth/Throat:     Mouth: Mucous membranes are moist.  Eyes:     General: No scleral icterus.    Extraocular Movements: Extraocular movements intact.  Cardiovascular:     Rate and Rhythm: Normal rate and regular rhythm.     Heart sounds: Normal heart sounds. No murmur heard. Pulmonary:     Breath sounds: Normal breath sounds. No wheezing or rales.  Musculoskeletal:     Lumbar back: Tenderness present. Decreased range of motion. Positive right straight leg raise test and positive left straight leg raise test.     Right lower leg: No edema.     Left lower leg: No edema.  Skin:    General: Skin is warm.     Findings: No rash.  Neurological:     General: No focal deficit present.     Mental Status: She is alert and oriented to person, place, and time.  Psychiatric:        Mood and Affect: Mood  normal.  Behavior: Behavior normal.     BP 136/63   Pulse 73   Ht 5' 2 (1.575 m)   Wt 109 lb 9.6 oz (49.7 kg)   SpO2 98%   BMI 20.05 kg/m  Wt Readings from Last 3 Encounters:  10/16/23 109 lb 9.6 oz (49.7 kg)  10/03/23 107 lb 1.3 oz (48.6 kg)  08/18/23 103 lb (46.7 kg)        Assessment & Plan:   Problem List Items Addressed This Visit       Musculoskeletal and Integument   Degeneration of intervertebral disc of lumbar region with discogenic back pain and lower extremity pain - Primary   Acute on chronic low back pain Previous x-ray of lumbar spine in chart showed DDD of lumbar spine with anterolisthesis L4-L5 She is currently taking gabapentin  300 mg 3 times daily Depo-Medrol  and Toradol  IM today Prednisone  40 mg once daily X 5 days Tylenol  as needed for mild to moderate pain Avoided muscle relaxer considering her age -if persistent discomfort, can consider low-dose muscle relaxer If persistent pain, will obtain imaging and PT evaluation      Relevant Medications   predniSONE  (DELTASONE ) 20 MG tablet     Meds ordered this encounter  Medications   predniSONE  (DELTASONE ) 20 MG tablet    Sig: Take 1 tablet (20 mg total) by mouth daily with breakfast.    Dispense:  10 tablet    Refill:  0     Denys Labree MARLA Blanch, MD

## 2023-11-12 ENCOUNTER — Telehealth: Payer: Self-pay | Admitting: Family Medicine

## 2023-11-12 NOTE — Telephone Encounter (Signed)
 Patient here wanting to speak with a nurse in regards to Amitriptyline says we have been signing off on it but on my end says it hasn't been refilled since 2019. Please speak with patient in regards to this medication Thank you

## 2023-11-13 NOTE — Telephone Encounter (Signed)
 Tried calling pt to discuss medication, unable to leave a vm.

## 2023-11-13 NOTE — Telephone Encounter (Signed)
 Hi Terry Allen it on her med list but I have not prescribed this does patient want a refill?

## 2023-11-17 ENCOUNTER — Telehealth: Payer: Self-pay | Admitting: Family Medicine

## 2023-11-17 NOTE — Telephone Encounter (Signed)
 error

## 2023-11-20 ENCOUNTER — Other Ambulatory Visit: Payer: Self-pay | Admitting: Family Medicine

## 2023-11-20 DIAGNOSIS — Z681 Body mass index (BMI) 19 or less, adult: Secondary | ICD-10-CM | POA: Diagnosis not present

## 2023-11-20 DIAGNOSIS — E7849 Other hyperlipidemia: Secondary | ICD-10-CM | POA: Diagnosis not present

## 2023-11-20 DIAGNOSIS — R319 Hematuria, unspecified: Secondary | ICD-10-CM | POA: Diagnosis not present

## 2023-11-20 DIAGNOSIS — N390 Urinary tract infection, site not specified: Secondary | ICD-10-CM | POA: Diagnosis not present

## 2023-11-20 DIAGNOSIS — R300 Dysuria: Secondary | ICD-10-CM | POA: Diagnosis not present

## 2023-11-20 DIAGNOSIS — E039 Hypothyroidism, unspecified: Secondary | ICD-10-CM | POA: Diagnosis not present

## 2023-11-20 DIAGNOSIS — E782 Mixed hyperlipidemia: Secondary | ICD-10-CM | POA: Diagnosis not present

## 2024-01-01 ENCOUNTER — Ambulatory Visit: Payer: Medicare HMO | Admitting: Family Medicine

## 2024-01-08 ENCOUNTER — Ambulatory Visit: Payer: Self-pay | Admitting: *Deleted

## 2024-01-08 NOTE — Telephone Encounter (Signed)
 Copied from CRM [id number]. Topic: Clinical - Medical Advice >> Jan 08, 2024 12:33 PM [employer] wrote: Reason for CRM: Patient would like to know if she should be fasting for her upcoming appointment on 01/13/24.  Callback #: [id number] Reason for Disposition  [1] Follow-up call to recent contact AND [2] information only call, no triage required  Answer Assessment - Initial Assessment Questions 1. REASON FOR CALL or QUESTION: "What is your reason for calling today?" or "How can I best help you?" or "What question do you have that I can help answer?"     Pt called the wrong practice.    She meant to call [facility] at [facility]. Who she has an appt with on 01/13/2024.   She was wanting to know if she needs to be fasting. I let her know she needed to call them because it would depend on what they wanted her to do.    She apologized several times.   "I got my numbers mixed up".    I let her know that was ok.  Protocols used: Information Only Call - No Triage-A-AH  Chief Complaint: Pt needing to know if she needed to fast for her appt on 01/13/2024 with [facility] at [facility].   She got her numbers mixed up and called [facility] instead of the other office.  She apologized and will call the GI practice.    Symptoms: N/A Frequency: N/A Pertinent Negatives: Patient denies N/A Disposition: [] ED /[] Urgent Care (no appt availability in office) / [] Appointment(In office/virtual)/ []  [facility] [facility]/ [x] Home Care/ [] Refused Recommended Disposition /[] [place] [facility]/ []  Follow-up with PCP Additional Notes:

## 2024-01-13 ENCOUNTER — Ambulatory Visit (INDEPENDENT_AMBULATORY_CARE_PROVIDER_SITE_OTHER): Admitting: Gastroenterology

## 2024-01-13 ENCOUNTER — Other Ambulatory Visit: Payer: Self-pay | Admitting: Family Medicine

## 2024-01-13 ENCOUNTER — Encounter: Payer: Self-pay | Admitting: Gastroenterology

## 2024-01-13 VITALS — BP 144/62 | HR 65 | Temp 97.3°F | Ht 62.0 in | Wt 109.2 lb

## 2024-01-13 DIAGNOSIS — Z860101 Personal history of adenomatous and serrated colon polyps: Secondary | ICD-10-CM

## 2024-01-13 DIAGNOSIS — K5900 Constipation, unspecified: Secondary | ICD-10-CM | POA: Diagnosis not present

## 2024-01-13 DIAGNOSIS — R110 Nausea: Secondary | ICD-10-CM

## 2024-01-13 DIAGNOSIS — R1312 Dysphagia, oropharyngeal phase: Secondary | ICD-10-CM | POA: Diagnosis not present

## 2024-01-13 DIAGNOSIS — R1013 Epigastric pain: Secondary | ICD-10-CM | POA: Insufficient documentation

## 2024-01-13 DIAGNOSIS — E039 Hypothyroidism, unspecified: Secondary | ICD-10-CM

## 2024-01-13 DIAGNOSIS — R12 Heartburn: Secondary | ICD-10-CM

## 2024-01-13 DIAGNOSIS — R197 Diarrhea, unspecified: Secondary | ICD-10-CM

## 2024-01-13 DIAGNOSIS — K582 Mixed irritable bowel syndrome: Secondary | ICD-10-CM | POA: Insufficient documentation

## 2024-01-13 MED ORDER — ONDANSETRON HCL 4 MG PO TABS: 4.0000 mg | ORAL_TABLET | Freq: Three times a day (TID) | ORAL | 1 refills | Status: AC | PRN

## 2024-01-13 NOTE — Progress Notes (Addendum)
 Gastroenterology Office Note     Primary Care Physician:  Rosanna Comment, FNP  Primary Gastroenterologist: Dr. Sammi Crick    Chief Complaint   Chief Complaint  Patient presents with   Follow-up    Patient here today due to her still having issues with swallowing and having bouts of nausea. She is taking Esomeprazole  40 mg daily.      History of Present Illness   Terry Allen is an 83 y.o. female presenting today with a history of GERD, esophageal web, hyperlipidemia, hypothyroidism, osteopenia, recurrent dysphagia, remote history of Barrett's but biopsies in 2013 and 2016 negative for Barrett's, no changes suggesting Barrett's in 2019/2020, multiple prior dilatations in the past, last undergoing dilation in Sept 2024. She is here for follow-up.  At time of last dilation Sept 2024 of esophageal web, recommendations had been made for 6 week repeat EGD. She was lost to follow-up. Had some improvement initially and then recurrent dysphagia.   Notes solid food dysphagia. Upper abdomen will hurt and radiate up. No postprandial component. No food fear or food aversions.  Nauseated most of time. Nausea not worsened after eating. Nausea underlying, whether she eats or not. Coughs after eating and talking at times. Hard to swallow pills most of time. Sore throat. Has regurgitation of reflux and phlegm.   Esomeprazole  daily before breakfast. Taking ibuprofen pretty much daily. Yesterday took 4 tablets. Chronic back pain and pain from waist down.   Will have alternating constipation and diarrhea. Not taking anything for constipation. No overt GI bleeding.   Weight is stable.    Prior GI evaluation:   BPE May 2023: Thin mucosal web at the cervical esophagus, increased in size since 2020, significantly narrowing the AP diameter of the cervical esophagus and obstructing the 12.5 mm diameter barium tablet.   Laryngeal penetration and aspiration of contrast wall swallowing  the 12.5 mm diameter barium tablet as it obstructed in the cervical esophagus, with spontaneous cough reflex present.   EGD Sept 2024: esophageal web s/p dilation, normal stomach and duodenum. Recommended to repeat EGD in 6 weeks. Had some improvement but now recurred.   Last Colonoscopy: 11/03/2019 - One small polyp in the cecum. Biopsied. - Diverticulosis in the sigmoid colon.   Path: A. COLON, CECAL, POLYPECTOMY:  - Sessile serrated polyp/adenoma (s) without cytologic dysplasia.  - No evidence of carcinoma.    Recommended repeat colonoscopy in 5 years if medically fit.  Past Medical History:  Diagnosis Date   Acid reflux    Patient reports Barretts   Bulging lumbar disc    High cholesterol    Hypothyroidism    Nerve damage    Osteopenia    Pelvic pain in female    due to bladder sling    Past Surgical History:  Procedure Laterality Date   APPENDECTOMY  1960   BIOPSY  11/03/2019   Procedure: BIOPSY;  Surgeon: Ruby Corporal, MD;  Location: [facility];  Service: Endoscopy;;   BLADDER SUSPENSION     CATARACT EXTRACTION W/PHACO Right 06/07/2019   Procedure: CATARACT EXTRACTION PHACO AND INTRAOCULAR LENS PLACEMENT (IOC);  Surgeon: Tarri Farm, MD;  Location: [facility];  Service: Ophthalmology;  Laterality: Right;  CDE: 10.87   CATARACT EXTRACTION W/PHACO Left 06/21/2019   Procedure: CATARACT EXTRACTION PHACO AND INTRAOCULAR LENS PLACEMENT (IOC);  Surgeon: Tarri Farm, MD;  Location: [facility];  Service: Ophthalmology;  Laterality: Left;  CDE: 11.59   COLONOSCOPY N/A 11/03/2019   Procedure: COLONOSCOPY;  Surgeon: Ruby Corporal, MD;  Location: [facility];  Service: Endoscopy;  Laterality: N/A;  1245   ESOPHAGEAL DILATION N/A 05/27/2018   Procedure: ESOPHAGEAL DILATION;  Surgeon: Ruby Corporal, MD;  [facility]: [facility];  Service: Endoscopy;  Laterality: N/A;   ESOPHAGEAL DILATION N/A 02/12/2019   Procedure: ESOPHAGEAL DILATION;  Surgeon: Ruby Corporal, MD;   Location: [facility];  Service: Endoscopy;  Laterality: N/A;   ESOPHAGEAL DILATION N/A 02/14/2022   Procedure: ESOPHAGEAL DILATION;  Surgeon: Ruby Corporal, MD;  Location: [facility];  Service: Endoscopy;  Laterality: N/A;   ESOPHAGEAL DILATION N/A 02/27/2022   Procedure: ESOPHAGEAL DILATION;  Surgeon: Urban Garden, MD;  Location: [facility];  Service: Gastroenterology;  Laterality: N/A;   ESOPHAGEAL DILATION N/A 05/14/2023   Procedure: ESOPHAGEAL DILATION;  Surgeon: Urban Garden, MD;  [facility]: [facility];  Service: Gastroenterology;  Laterality: N/A;   ESOPHAGOGASTRODUODENOSCOPY N/A 12/21/2014   Procedure: ESOPHAGOGASTRODUODENOSCOPY (EGD);  Surgeon: Ruby Corporal, MD;  [facility]: [facility];  Service: Endoscopy;  Laterality: N/A;  210   ESOPHAGOGASTRODUODENOSCOPY N/A 06/16/2015   Procedure: ESOPHAGOGASTRODUODENOSCOPY (EGD);  Surgeon: Ruby Corporal, MD;  [facility]: [facility];  Service: Endoscopy;  Laterality: N/A;  1250   ESOPHAGOGASTRODUODENOSCOPY N/A 05/27/2018   Procedure: ESOPHAGOGASTRODUODENOSCOPY (EGD);  Surgeon: Ruby Corporal, MD;  [facility]: [facility];  Service: Endoscopy;  Laterality: N/A;  7:30   ESOPHAGOGASTRODUODENOSCOPY N/A 02/12/2019   Procedure: ESOPHAGOGASTRODUODENOSCOPY (EGD);  Surgeon: Ruby Corporal, MD;  [facility]: [facility];  Service: Endoscopy;  Laterality: N/A;   ESOPHAGOGASTRODUODENOSCOPY (EGD) WITH PROPOFOL  N/A 02/14/2022   Procedure: ESOPHAGOGASTRODUODENOSCOPY (EGD) WITH PROPOFOL ;  Surgeon: Ruby Corporal, MD;  [facility]: [facility];  Service: Endoscopy;  Laterality: N/A;  920   ESOPHAGOGASTRODUODENOSCOPY (EGD) WITH PROPOFOL  N/A 02/27/2022   Procedure: ESOPHAGOGASTRODUODENOSCOPY (EGD) WITH PROPOFOL ;  Surgeon: Urban Garden, MD;  Location: [facility];  Service: Gastroenterology;  Laterality: N/A;  1:15 Per Dr Sammi Crick asa 1   ESOPHAGOGASTRODUODENOSCOPY (EGD) WITH PROPOFOL  N/A 05/14/2023   Procedure:  ESOPHAGOGASTRODUODENOSCOPY (EGD) WITH PROPOFOL ;  Surgeon: Urban Garden, MD;  Location: [facility];  Service: Gastroenterology;  Laterality: N/A;  11:15 AM, ASA 2    Current Outpatient Medications  Medication Sig Dispense Refill   acetaminophen  (TYLENOL ) 325 MG tablet Take 2 tablets (650 mg total) by mouth every 6 (six) hours as needed for mild pain (or Fever >/= 101).     albuterol  (VENTOLIN  HFA) 108 (90 Base) MCG/ACT inhaler Inhale 2 puffs into the lungs every 6 (six) hours as needed for wheezing or shortness of breath. 8 g 2   amitriptyline  (ELAVIL ) 50 MG tablet Take 50 mg by mouth at bedtime.   2   atorvastatin  (LIPITOR) 40 MG tablet Take 40 mg by mouth at bedtime.      Calcium  Carb-Cholecalciferol (CALCIUM  600 + D PO) Take 1 tablet by mouth 2 (two) times a day.     esomeprazole  (NEXIUM ) 40 MG capsule Take 1 capsule (40 mg total) by mouth daily before breakfast. 30 capsule 3   gabapentin  (NEURONTIN ) 300 MG capsule Take 1 capsule (300 mg total) by mouth 3 (three) times daily.     levothyroxine  (SYNTHROID ) 75 MCG tablet Take 1 tablet by mouth once daily 30 tablet 3   magic mouthwash (lidocaine , diphenhydrAMINE , alum & mag hydroxide) suspension Swish and swallow 5 mLs 3 (three) times daily as needed for mouth pain (throat discomfort/pain). 360 mL 1  ondansetron  (ZOFRAN -ODT) 4 MG disintegrating tablet Take 1 tablet (4 mg total) by mouth every 8 (eight) hours as needed for nausea or vomiting. 60 tablet 2   potassium chloride  SA (KLOR-CON  M) 20 MEQ tablet Take 1 tablet (20 mEq total) by mouth 2 (two) times daily. 4 tablet 0   sucralfate  (CARAFATE ) 1 g tablet Take 1 tablet (1 g total) by mouth 2 (two) times daily. 60 tablet 3   tretinoin (RETIN-A) 0.025 % cream Apply topically at bedtime as needed.     estradiol  (ESTRACE ) 0.1 MG/GM vaginal cream Discard plastic applicator. Insert a blueberry size amount (approximately 1 gram) of cream on fingertip inside vagina at bedtime every night  for 1 week then 2 nights per week for long term use. (Patient not taking: Reported on 01/13/2024) 30 g 3   meclizine  (ANTIVERT ) 12.5 MG tablet Take 1 tablet (12.5 mg total) by mouth 3 (three) times daily as needed for dizziness. (Patient not taking: Reported on 01/13/2024) 30 tablet 0   polyethylene glycol (MIRALAX ) 17 g packet Take 17 g by mouth daily. (Patient not taking: Reported on 01/13/2024) 100 each 0   Vibegron  (GEMTESA ) 75 MG TABS Take 1 tablet (75 mg total) by mouth daily. (Patient not taking: Reported on 01/13/2024) 30 tablet 11   No current facility-administered medications for this visit.    Allergies as of 01/13/2024   (No Known Allergies)    Family History  Problem Relation Age of Onset   Breast cancer Mother    Heart disease Mother    Diabetes Mother    Cancer Mother        breast   Stroke Mother    Hypertension Mother    Lung cancer Father    Stroke Father    Cancer Father        oat cell cancer/lung   Kidney cancer Brother    Cancer Brother        renal cell cancer   Healthy Brother    Diabetes Son     Social History   Socioeconomic History   Marital status: Divorced    Spouse name: Not on file   Number of children: Not on file   Years of education: Not on file   Highest education level: Not on file  Occupational History   Not on file  Tobacco Use   Smoking status: Never    Passive exposure: Never   Smokeless tobacco: Never  Vaping Use   Vaping status: Never Used  Substance and Sexual Activity   Alcohol use: No    Alcohol/week: 0.0 standard drinks of alcohol   Drug use: No   Sexual activity: Never  Other Topics Concern   Not on file  Social History Narrative   Not on file   Social Drivers of Health   Financial Resource Strain: Not on file  Food Insecurity: Not on file  Transportation Needs: Not on file  Physical Activity: Not on file  Stress: Not on file  Social Connections: Not on file  Intimate Partner Violence: Not on file     Review  of Systems   Gen: Denies any fever, chills, fatigue, weight loss, lack of appetite.  CV: Denies chest pain, heart palpitations, peripheral edema, syncope.  Resp: Denies shortness of breath at rest or with exertion. Denies wheezing or cough.  GI: see HPI GU : Denies urinary burning, urinary frequency, urinary hesitancy MS: Denies joint pain, muscle weakness, cramps, or limitation of movement.  Derm: Denies rash, itching,  dry skin Psych: Denies depression, anxiety, memory loss, and confusion Heme: Denies bruising, bleeding, and enlarged lymph nodes.   Physical Exam   BP (!) 144/62 (BP Location: Left Arm, Patient Position: Sitting, Cuff Size: Normal)   Pulse 65   Temp (!) 97.3 F (36.3 C) (Temporal)   Ht 5\' 2"  (1.575 m)   Wt 109 lb 3.2 oz (49.5 kg)   BMI 19.97 kg/m  General:   Alert and oriented. Pleasant and cooperative. Well-nourished and well-developed.  Head:  Normocephalic and atraumatic. Eyes:  Without icterus Abdomen:  +BS, soft, TTP LUQ and LLQ and non-distended. No HSM noted. No guarding or rebound. No masses appreciated.  Rectal:  Deferred  Msk:  Symmetrical without gross deformities. Normal posture. Extremities:  Without edema. Neurologic:  Alert and  oriented x4;  grossly normal neurologically. Skin:  Intact without significant lesions or rashes. Psych:  Alert and cooperative. Normal mood and affect.   Assessment   Terry Allen is an 83 y.o. female presenting today with a history of GERD, esophageal web s/p multiple dilations, hyperlipidemia, hypothyroidism, osteopenia, recurrent dysphagia, remote history of Barrett's but biopsies in 2013 and 2016 negative for Barrett's, no changes suggesting Barrett's in 2019/2020, multiple prior dilatations in the past, last undergoing dilation in Sept 2024, presenting with recurrent dysphagia.  Dysphagia: noting solid food and pill dysphagia. Known prior esophageal web with improvement s/p dilation and recommendations for  early interval dilation when last seen Sept 2024. She does have an oropharyngeal component as well, with prior BPE May 2023 noting laryngeal penetration and aspiration of contrast. Will arrange EGD/dilation in near future and likely refer to Speech as well for further evaluation such as MBSS and therapy as needed.   Dyspepsia: with associated nausea. Endorsing NSAIDs routinely. Suspect NSAID-gastritis. No postprandial worsening, no food fear of food aversions, no weight loss. Gallbladder is present but does not appear biliary related. EGD as planned and avoid NSAIDs. May need updated CT vs US  if biliary concern. Does not seem consistent with chronic mesenteric ischemia.   Alternating bowel habits: with constipation and diarrhea. Difficult historian. No overt GI bleeding. Will start Miralax  daily. Can address further at next visit as she is primarily concerned about UGI symptoms.   History sessile serrated adenoma in 2021: surveillance in 2026 if health permits.        PLAN    Proceed with upper endoscopy/dilation in near future: the risks, benefits, and alternatives have been discussed with the patient in detail. The patient states understanding and desires to proceed.  Continue nexium  once daily. Stop all NSAIDas Zofran  prn May need speech referral thereafter for oropharyngeal component Miralax  daily if no BM Follow-up in clinic after procedures, consider updated imaging if persistent dyspepsia despite avoidance of NSAIDs and if EGD negative  Delman Ferns, PhD, ANP-BC [facility] Gastroenterology   I have reviewed the note and agree with the APP's assessment as described in this progress note  Samantha Cress, MD Gastroenterology and Hepatology [facility] Gastroenterology

## 2024-01-13 NOTE — H&P (View-Only) (Signed)
 Gastroenterology Office Note     Primary Care Physician:  Rosanna Comment, FNP  Primary Gastroenterologist: Dr. Sammi Crick    Chief Complaint   Chief Complaint  Patient presents with   Follow-up    Patient here today due to her still having issues with swallowing and having bouts of nausea. She is taking Esomeprazole  40 mg daily.      History of Present Illness   Terry Allen is an 83 y.o. female presenting today with a history of GERD, esophageal web, hyperlipidemia, hypothyroidism, osteopenia, recurrent dysphagia, remote history of Barrett's but biopsies in 2013 and 2016 negative for Barrett's, no changes suggesting Barrett's in 2019/2020, multiple prior dilatations in the past, last undergoing dilation in Sept 2024. She is here for follow-up.  At time of last dilation Sept 2024 of esophageal web, recommendations had been made for 6 week repeat EGD. She was lost to follow-up. Had some improvement initially and then recurrent dysphagia.   Notes solid food dysphagia. Upper abdomen will hurt and radiate up. No postprandial component. No food fear or food aversions.  Nauseated most of time. Nausea not worsened after eating. Nausea underlying, whether she eats or not. Coughs after eating and talking at times. Hard to swallow pills most of time. Sore throat. Has regurgitation of reflux and phlegm.   Esomeprazole  daily before breakfast. Taking ibuprofen pretty much daily. Yesterday took 4 tablets. Chronic back pain and pain from waist down.   Will have alternating constipation and diarrhea. Not taking anything for constipation. No overt GI bleeding.   Weight is stable.    Prior GI evaluation:   BPE May 2023: Thin mucosal web at the cervical esophagus, increased in size since 2020, significantly narrowing the AP diameter of the cervical esophagus and obstructing the 12.5 mm diameter barium tablet.   Laryngeal penetration and aspiration of contrast wall swallowing  the 12.5 mm diameter barium tablet as it obstructed in the cervical esophagus, with spontaneous cough reflex present.   EGD Sept 2024: esophageal web s/p dilation, normal stomach and duodenum. Recommended to repeat EGD in 6 weeks. Had some improvement but now recurred.   Last Colonoscopy: 11/03/2019 - One small polyp in the cecum. Biopsied. - Diverticulosis in the sigmoid colon.   Path: A. COLON, CECAL, POLYPECTOMY:  - Sessile serrated polyp/adenoma (s) without cytologic dysplasia.  - No evidence of carcinoma.    Recommended repeat colonoscopy in 5 years if medically fit.  Past Medical History:  Diagnosis Date   Acid reflux    Patient reports Barretts   Bulging lumbar disc    High cholesterol    Hypothyroidism    Nerve damage    Osteopenia    Pelvic pain in female    due to bladder sling    Past Surgical History:  Procedure Laterality Date   APPENDECTOMY  1960   BIOPSY  11/03/2019   Procedure: BIOPSY;  Surgeon: Ruby Corporal, MD;  Location[facility];  Service: Endoscopy;;   BLADDER SUSPENSION     CATARACT EXTRACTION W/PHACO Right 06/07/2019   Procedure: CATARACT EXTRACTION PHACO AND INTRAOCULAR LENS PLACEMENT (IOC);  Surgeon: Tarri Farm, MD;  Location: [facility];  Service: Ophthalmology;  Laterality: Right;  CDE: 10.87   CATARACT EXTRACTION W/PHACO Left 06/21/2019   Procedure: CATARACT EXTRACTION PHACO AND INTRAOCULAR LENS PLACEMENT (IOC);  Surgeon: Tarri Farm, MD;  Location: [facility];  Service: Ophthalmology;  Laterality: Left;  CDE: 11.59   COLONOSCOPY N/A 11/03/2019   Procedure: COLONOSCOPY;  Surgeon: Ruby Corporal, MD;  [facility];  Service: Endoscopy;  Laterality: N/A;  1245   ESOPHAGEAL DILATION N/A 05/27/2018   Procedure: ESOPHAGEAL DILATION;  Surgeon: Ruby Corporal, MD;  [facility];  Service: Endoscopy;  Laterality: N/A;   ESOPHAGEAL DILATION N/A 02/12/2019   Procedure: ESOPHAGEAL DILATION;  Surgeon: Ruby Corporal, MD;   [facility];  Service: Endoscopy;  Laterality: N/A;   ESOPHAGEAL DILATION N/A 02/14/2022   Procedure: ESOPHAGEAL DILATION;  Surgeon: Ruby Corporal, MD;  [facility]: [facility];  Service: Endoscopy;  Laterality: N/A;   ESOPHAGEAL DILATION N/A 02/27/2022   Procedure: ESOPHAGEAL DILATION;  Surgeon: Urban Garden, MD;  [facility];  Service: Gastroenterology;  Laterality: N/A;   ESOPHAGEAL DILATION N/A 05/14/2023   Procedure: ESOPHAGEAL DILATION;  Surgeon: Urban Garden, MD;  [facility];  Service: Gastroenterology;  Laterality: N/A;   ESOPHAGOGASTRODUODENOSCOPY N/A 12/21/2014   Procedure: ESOPHAGOGASTRODUODENOSCOPY (EGD);  Surgeon: Ruby Corporal, MD;  [facility];  Service: Endoscopy;  Laterality: N/A;  210   ESOPHAGOGASTRODUODENOSCOPY N/A 06/16/2015   Procedure: ESOPHAGOGASTRODUODENOSCOPY (EGD);  Surgeon: Ruby Corporal, MD;  [facility];  Service: Endoscopy;  Laterality: N/A;  1250   ESOPHAGOGASTRODUODENOSCOPY N/A 05/27/2018   Procedure: ESOPHAGOGASTRODUODENOSCOPY (EGD);  Surgeon: Ruby Corporal, MD;  [facility];  Service: Endoscopy;  Laterality: N/A;  7:30   ESOPHAGOGASTRODUODENOSCOPY N/A 02/12/2019   Procedure: ESOPHAGOGASTRODUODENOSCOPY (EGD);  Surgeon: Ruby Corporal, MD;  [facility];  Service: Endoscopy;  Laterality: N/A;   ESOPHAGOGASTRODUODENOSCOPY (EGD) WITH PROPOFOL  N/A 02/14/2022   Procedure: ESOPHAGOGASTRODUODENOSCOPY (EGD) WITH PROPOFOL ;  Surgeon: Ruby Corporal, MD;  [facility];  Service: Endoscopy;  Laterality: N/A;  920   ESOPHAGOGASTRODUODENOSCOPY (EGD) WITH PROPOFOL  N/A 02/27/2022   Procedure: ESOPHAGOGASTRODUODENOSCOPY (EGD) WITH PROPOFOL ;  Surgeon: Urban Garden, MD;  [facility];  Service: Gastroenterology;  Laterality: N/A;  1:15 Per Dr Sammi Crick asa 1   ESOPHAGOGASTRODUODENOSCOPY (EGD) WITH PROPOFOL  N/A 05/14/2023   Procedure:  ESOPHAGOGASTRODUODENOSCOPY (EGD) WITH PROPOFOL ;  Surgeon: Urban Garden, MD;  [facility];  Service: Gastroenterology;  Laterality: N/A;  11:15 AM, ASA 2    Current Outpatient Medications  Medication Sig Dispense Refill   acetaminophen  (TYLENOL ) 325 MG tablet Take 2 tablets (650 mg total) by mouth every 6 (six) hours as needed for mild pain (or Fever >/= 101).     albuterol  (VENTOLIN  HFA) 108 (90 Base) MCG/ACT inhaler Inhale 2 puffs into the lungs every 6 (six) hours as needed for wheezing or shortness of breath. 8 g 2   amitriptyline  (ELAVIL ) 50 MG tablet Take 50 mg by mouth at bedtime.   2   atorvastatin  (LIPITOR) 40 MG tablet Take 40 mg by mouth at bedtime.      Calcium  Carb-Cholecalciferol (CALCIUM  600 + D PO) Take 1 tablet by mouth 2 (two) times a day.     esomeprazole  (NEXIUM ) 40 MG capsule Take 1 capsule (40 mg total) by mouth daily before breakfast. 30 capsule 3   gabapentin  (NEURONTIN ) 300 MG capsule Take 1 capsule (300 mg total) by mouth 3 (three) times daily.     levothyroxine  (SYNTHROID ) 75 MCG tablet Take 1 tablet by mouth once daily 30 tablet 3   magic mouthwash (lidocaine , diphenhydrAMINE , alum & mag hydroxide) suspension Swish and swallow 5 mLs 3 (three) times daily as needed for mouth pain (throat discomfort/pain). 360 mL 1  ondansetron  (ZOFRAN -ODT) 4 MG disintegrating tablet Take 1 tablet (4 mg total) by mouth every 8 (eight) hours as needed for nausea or vomiting. 60 tablet 2   potassium chloride  SA (KLOR-CON  M) 20 MEQ tablet Take 1 tablet (20 mEq total) by mouth 2 (two) times daily. 4 tablet 0   sucralfate  (CARAFATE ) 1 g tablet Take 1 tablet (1 g total) by mouth 2 (two) times daily. 60 tablet 3   tretinoin (RETIN-A) 0.025 % cream Apply topically at bedtime as needed.     estradiol  (ESTRACE ) 0.1 MG/GM vaginal cream Discard plastic applicator. Insert a blueberry size amount (approximately 1 gram) of cream on fingertip inside vagina at bedtime every night  for 1 week then 2 nights per week for long term use. (Patient not taking: Reported on 01/13/2024) 30 g 3   meclizine  (ANTIVERT ) 12.5 MG tablet Take 1 tablet (12.5 mg total) by mouth 3 (three) times daily as needed for dizziness. (Patient not taking: Reported on 01/13/2024) 30 tablet 0   polyethylene glycol (MIRALAX ) 17 g packet Take 17 g by mouth daily. (Patient not taking: Reported on 01/13/2024) 100 each 0   Vibegron  (GEMTESA ) 75 MG TABS Take 1 tablet (75 mg total) by mouth daily. (Patient not taking: Reported on 01/13/2024) 30 tablet 11   No current facility-administered medications for this visit.    Allergies as of 01/13/2024   (No Known Allergies)    Family History  Problem Relation Age of Onset   Breast cancer Mother    Heart disease Mother    Diabetes Mother    Cancer Mother        breast   Stroke Mother    Hypertension Mother    Lung cancer Father    Stroke Father    Cancer Father        oat cell cancer/lung   Kidney cancer Brother    Cancer Brother        renal cell cancer   Healthy Brother    Diabetes Son     Social History   Socioeconomic History   Marital status: Divorced    Spouse name: Not on file   Number of children: Not on file   Years of education: Not on file   Highest education level: Not on file  Occupational History   Not on file  Tobacco Use   Smoking status: Never    Passive exposure: Never   Smokeless tobacco: Never  Vaping Use   Vaping status: Never Used  Substance and Sexual Activity   Alcohol use: No    Alcohol/week: 0.0 standard drinks of alcohol   Drug use: No   Sexual activity: Never  Other Topics Concern   Not on file  Social History Narrative   Not on file   Social Drivers of Health   Financial Resource Strain: Not on file  Food Insecurity: Not on file  Transportation Needs: Not on file  Physical Activity: Not on file  Stress: Not on file  Social Connections: Not on file  Intimate Partner Violence: Not on file     Review  of Systems   Gen: Denies any fever, chills, fatigue, weight loss, lack of appetite.  CV: Denies chest pain, heart palpitations, peripheral edema, syncope.  Resp: Denies shortness of breath at rest or with exertion. Denies wheezing or cough.  GI: see HPI GU : Denies urinary burning, urinary frequency, urinary hesitancy MS: Denies joint pain, muscle weakness, cramps, or limitation of movement.  Derm: Denies rash, itching,  dry skin Psych: Denies depression, anxiety, memory loss, and confusion Heme: Denies bruising, bleeding, and enlarged lymph nodes.   Physical Exam   BP (!) 144/62 (BP Location: Left Arm, Patient Position: Sitting, Cuff Size: Normal)   Pulse 65   Temp (!) 97.3 F (36.3 C) (Temporal)   Ht 5\' 2"  (1.575 m)   Wt 109 lb 3.2 oz (49.5 kg)   BMI 19.97 kg/m  General:   Alert and oriented. Pleasant and cooperative. Well-nourished and well-developed.  Head:  Normocephalic and atraumatic. Eyes:  Without icterus Abdomen:  +BS, soft, TTP LUQ and LLQ and non-distended. No HSM noted. No guarding or rebound. No masses appreciated.  Rectal:  Deferred  Msk:  Symmetrical without gross deformities. Normal posture. Extremities:  Without edema. Neurologic:  Alert and  oriented x4;  grossly normal neurologically. Skin:  Intact without significant lesions or rashes. Psych:  Alert and cooperative. Normal mood and affect.   Assessment   Terry Allen is an 83 y.o. female presenting today with a history of GERD, esophageal web s/p multiple dilations, hyperlipidemia, hypothyroidism, osteopenia, recurrent dysphagia, remote history of Barrett's but biopsies in 2013 and 2016 negative for Barrett's, no changes suggesting Barrett's in 2019/2020, multiple prior dilatations in the past, last undergoing dilation in Sept 2024, presenting with recurrent dysphagia.  Dysphagia: noting solid food and pill dysphagia. Known prior esophageal web with improvement s/p dilation and recommendations for  early interval dilation when last seen Sept 2024. She does have an oropharyngeal component as well, with prior BPE May 2023 noting laryngeal penetration and aspiration of contrast. Will arrange EGD/dilation in near future and likely refer to Speech as well for further evaluation such as MBSS and therapy as needed.   Dyspepsia: with associated nausea. Endorsing NSAIDs routinely. Suspect NSAID-gastritis. No postprandial worsening, no food fear of food aversions, no weight loss. Gallbladder is present but does not appear biliary related. EGD as planned and avoid NSAIDs. May need updated CT vs US  if biliary concern. Does not seem consistent with chronic mesenteric ischemia.   Alternating bowel habits: with constipation and diarrhea. Difficult historian. No overt GI bleeding. Will start Miralax  daily. Can address further at next visit as she is primarily concerned about UGI symptoms.   History sessile serrated adenoma in 2021: surveillance in 2026 if health permits.        PLAN    Proceed with upper endoscopy/dilation in near future: the risks, benefits, and alternatives have been discussed with the patient in detail. The patient states understanding and desires to proceed.  Continue nexium  once daily. Stop all NSAIDas Zofran  prn May need speech referral thereafter for oropharyngeal component Miralax  daily if no BM Follow-up in clinic after procedures, consider updated imaging if persistent dyspepsia despite avoidance of NSAIDs and if EGD negative  Delman Ferns, PhD, ANP-BC [facility] Gastroenterology   I have reviewed the note and agree with the APP's assessment as described in this progress note  Samantha Cress, MD Gastroenterology and Hepatology [facility] Gastroenterology

## 2024-01-13 NOTE — Patient Instructions (Addendum)
 Continue esomeprazole  once each morning, 30 minutes before breakfast.  Stop taking any medications such as Ibuprofen, Advil, Aleve, Motrin, as this can hurt your stomach and cause ulcers.  I have sent in nausea medication to take every 8 hours as needed.  If no bowel movement on any given day, take one capful of Miralax .   We are arranging an upper endoscopy with dilation in near future.   I will see you in 3 months!  I enjoyed seeing you again today! I value our relationship and want to provide genuine, compassionate, and quality care. You may receive a survey regarding your visit with me, and I welcome your feedback! Thanks so much for taking the time to complete this. I look forward to seeing you again.      Delman Ferns, PhD, ANP-BC [facility] Gastroenterology

## 2024-01-14 ENCOUNTER — Telehealth (INDEPENDENT_AMBULATORY_CARE_PROVIDER_SITE_OTHER): Payer: Self-pay | Admitting: Gastroenterology

## 2024-01-14 NOTE — Telephone Encounter (Signed)
 Patient saw AB yesterday and is asking about scheduling her procedure. Bertell Broach said to send to you. Patient can be reached at [phone]

## 2024-01-14 NOTE — Telephone Encounter (Signed)
 Advised pt will be in touch to get her scheduled once provider has had a chance to talk with Dr.Ahmed. She asked about something that was scheduled for 5 pm today. Advised pt that I don't see any appointment scheduled for 5 pm today. Pt verbalized understanding.

## 2024-01-20 ENCOUNTER — Telehealth: Payer: Self-pay | Admitting: Gastroenterology

## 2024-01-20 DIAGNOSIS — H9313 Tinnitus, bilateral: Secondary | ICD-10-CM | POA: Diagnosis not present

## 2024-01-20 DIAGNOSIS — S80869A Insect bite (nonvenomous), unspecified lower leg, initial encounter: Secondary | ICD-10-CM | POA: Diagnosis not present

## 2024-01-20 DIAGNOSIS — Z6820 Body mass index (BMI) 20.0-20.9, adult: Secondary | ICD-10-CM | POA: Diagnosis not present

## 2024-01-20 NOTE — Telephone Encounter (Signed)
 Pt contacted and scheduled for 02/11/24. Instructions mailed to pt. PA approved via insurance.

## 2024-01-20 NOTE — Telephone Encounter (Signed)
 Tanya,  As June schedule is opened up, let's get patient on for EGD/dilation with Dr. Castaneda as soon as possible. Let me know if it's not within the next 4 weeks. Thanks!

## 2024-02-06 DIAGNOSIS — H1033 Unspecified acute conjunctivitis, bilateral: Secondary | ICD-10-CM | POA: Diagnosis not present

## 2024-02-06 DIAGNOSIS — Z6820 Body mass index (BMI) 20.0-20.9, adult: Secondary | ICD-10-CM | POA: Diagnosis not present

## 2024-02-06 DIAGNOSIS — H10023 Other mucopurulent conjunctivitis, bilateral: Secondary | ICD-10-CM | POA: Diagnosis not present

## 2024-02-11 ENCOUNTER — Ambulatory Visit (HOSPITAL_COMMUNITY)
Admission: RE | Admit: 2024-02-11 | Discharge: 2024-02-11 | Disposition: A | Discharge status: Home / Self Care | Attending: Gastroenterology | Admitting: Gastroenterology

## 2024-02-11 ENCOUNTER — Encounter (HOSPITAL_COMMUNITY)
Admission: RE | Disposition: A | Discharge status: Home / Self Care | Payer: Self-pay | Source: Home / Self Care | Attending: Gastroenterology

## 2024-02-11 ENCOUNTER — Telehealth (INDEPENDENT_AMBULATORY_CARE_PROVIDER_SITE_OTHER): Payer: Self-pay | Admitting: *Deleted

## 2024-02-11 ENCOUNTER — Ambulatory Visit (HOSPITAL_COMMUNITY): Admitting: Anesthesiology

## 2024-02-11 ENCOUNTER — Encounter (HOSPITAL_COMMUNITY): Payer: Self-pay | Admitting: Gastroenterology

## 2024-02-11 ENCOUNTER — Other Ambulatory Visit: Payer: Self-pay

## 2024-02-11 DIAGNOSIS — E039 Hypothyroidism, unspecified: Secondary | ICD-10-CM | POA: Diagnosis not present

## 2024-02-11 DIAGNOSIS — K219 Gastro-esophageal reflux disease without esophagitis: Secondary | ICD-10-CM | POA: Insufficient documentation

## 2024-02-11 DIAGNOSIS — Z79899 Other long term (current) drug therapy: Secondary | ICD-10-CM | POA: Insufficient documentation

## 2024-02-11 DIAGNOSIS — R110 Nausea: Secondary | ICD-10-CM | POA: Insufficient documentation

## 2024-02-11 DIAGNOSIS — R1310 Dysphagia, unspecified: Secondary | ICD-10-CM | POA: Diagnosis not present

## 2024-02-11 DIAGNOSIS — R1013 Epigastric pain: Secondary | ICD-10-CM

## 2024-02-11 DIAGNOSIS — E785 Hyperlipidemia, unspecified: Secondary | ICD-10-CM | POA: Diagnosis not present

## 2024-02-11 DIAGNOSIS — G8929 Other chronic pain: Secondary | ICD-10-CM | POA: Diagnosis not present

## 2024-02-11 HISTORY — PX: ESOPHAGOGASTRODUODENOSCOPY: SHX5428

## 2024-02-11 HISTORY — PX: ESOPHAGEAL DILATION: SHX303

## 2024-02-11 SURGERY — EGD (ESOPHAGOGASTRODUODENOSCOPY)
Anesthesia: General

## 2024-02-11 MED ORDER — PROPOFOL 10 MG/ML IV BOLUS
INTRAVENOUS | Status: DC | PRN
Start: 2024-02-11 — End: 2024-02-11
  Administered 2024-02-11: 40 mg via INTRAVENOUS
  Administered 2024-02-11: 60 mg via INTRAVENOUS

## 2024-02-11 MED ORDER — LACTATED RINGERS IV SOLN: INTRAVENOUS | Status: DC

## 2024-02-11 MED ORDER — LIDOCAINE 2% (20 MG/ML) 5 ML SYRINGE
INTRAMUSCULAR | Status: DC | PRN
  Administered 2024-02-11: 50 mg via INTRAVENOUS

## 2024-02-11 MED ORDER — PROPOFOL 500 MG/50ML IV EMUL
INTRAVENOUS | Status: DC | PRN
Start: 2024-02-11 — End: 2024-02-11
  Administered 2024-02-11: 150 ug/kg/min via INTRAVENOUS

## 2024-02-11 MED ORDER — LACTATED RINGERS IV SOLN
INTRAVENOUS | Status: DC | PRN
Start: 2024-02-11 — End: 2024-02-11

## 2024-02-11 MED ORDER — DIPHENHYDRAMINE HCL 12.5 MG/5ML PO LIQD: 5.0000 mL | Freq: Three times a day (TID) | ORAL | 1 refills | Status: AC | PRN

## 2024-02-11 NOTE — Anesthesia Procedure Notes (Signed)
 Date/Time: 02/11/2024 11:57 AM  Performed by: Sherwin Donate, CRNAPre-anesthesia Checklist: Patient identified, Emergency Drugs available, Suction available and Patient being monitored Patient Re-evaluated:Patient Re-evaluated prior to induction Oxygen Delivery Method: Nasal cannula Induction Type: IV induction Placement Confirmation: positive ETCO2 Comments: Optiflow High Flow [place] O2 used.

## 2024-02-11 NOTE — Interval H&P Note (Signed)
 History and Physical Interval Note:  02/11/2024 11:24 AM  Terry Allen  has presented today for surgery, with the diagnosis of DYSPEPSIA.  The various methods of treatment have been discussed with the patient and family. After consideration of risks, benefits and other options for treatment, the patient has consented to  Procedure(s) with comments: EGD (ESOPHAGOGASTRODUODENOSCOPY) (N/A) - 1:00PM;ASA 2 DILATION, ESOPHAGUS (N/A) - 1:00PM;ASA  2 as a surgical intervention.  The patient's history has been reviewed, patient examined, no change in status, stable for surgery.  I have reviewed the patient's chart and labs.  Questions were answered to the patient's satisfaction.     [name]

## 2024-02-11 NOTE — Anesthesia Preprocedure Evaluation (Addendum)
 Anesthesia Evaluation  Patient identified by MRN, date of birth, ID band Patient awake    Reviewed: Allergy & Precautions, H&P , NPO status , Patient's Chart, lab work & pertinent test results, reviewed documented beta blocker date and time   Airway Mallampati: II  TM Distance: >3 FB Neck ROM: full    Dental no notable dental hx.    Pulmonary neg pulmonary ROS   Pulmonary exam normal breath sounds clear to auscultation       Cardiovascular Exercise Tolerance: Good hypertension, negative cardio ROS  Rhythm:regular Rate:Normal     Neuro/Psych negative neurological ROS  negative psych ROS   GI/Hepatic negative GI ROS, Neg liver ROS,GERD  ,,  Endo/Other  negative endocrine ROSHypothyroidism    Renal/GU negative Renal ROS  negative genitourinary   Musculoskeletal   Abdominal   Peds  Hematology negative hematology ROS (+)   Anesthesia Other Findings   Reproductive/Obstetrics negative OB ROS                             Anesthesia Physical Anesthesia Plan  ASA: 2  Anesthesia Plan: General   Post-op Pain Management:    Induction:   PONV Risk Score and Plan: Propofol infusion  Airway Management Planned:   Additional Equipment:   Intra-op Plan:   Post-operative Plan:   Informed Consent: I have reviewed the patients History and Physical, chart, labs and discussed the procedure including the risks, benefits and alternatives for the proposed anesthesia with the patient or authorized representative who has indicated his/her understanding and acceptance.     Dental Advisory Given  Plan Discussed with: CRNA  Anesthesia Plan Comments:        Anesthesia Quick Evaluation

## 2024-02-11 NOTE — Telephone Encounter (Signed)
 Per EGD op note, needs repeat EGD in 1 month for treatment

## 2024-02-11 NOTE — Transfer of Care (Signed)
 Immediate Anesthesia Transfer of Care Note  Patient: Terry Allen  Procedure(s) Performed: EGD (ESOPHAGOGASTRODUODENOSCOPY) DILATION, ESOPHAGUS  Patient Location: Endoscopy Unit  Anesthesia Type:General  Level of Consciousness: drowsy  Airway & Oxygen Therapy: Patient Spontanous Breathing  Post-op Assessment: Report given to RN and Post -op Vital signs reviewed and stable  Post vital signs: Reviewed and stable  Last Vitals:  Vitals Value Taken Time  BP    Temp    Pulse    Resp    SpO2      Last Pain:  Vitals:   02/11/24 1157  TempSrc:   PainSc: 4       Patients Stated Pain Goal: 4 (02/11/24 1123)  Complications: No notable events documented.

## 2024-02-11 NOTE — Op Note (Addendum)
 [facility] Patient Name: Terry Allen Procedure Date: 02/11/2024 11:48 AM MRN: [id number] Date of Birth: 05/09/1941 Attending MD: Samantha Cress , , [id number] CSN: [id number] Age: 83 Admit Type: Outpatient Procedure:                Upper GI endoscopy Indications:              Dysphagia Providers:                Samantha Cress, Crystal Page, Sharlette Dayhoff                            Technician, Technician Referring MD:             Samantha Cress Medicines:                Monitored Anesthesia Care Complications:            No immediate complications. Estimated Blood Loss:     Estimated blood loss: none. Procedure:                Pre-Anesthesia Assessment:                           - Prior to the procedure, a History and Physical                            was performed, and patient medications, allergies                            and sensitivities were reviewed. The patient's                            tolerance of previous anesthesia was reviewed.                           - The risks and benefits of the procedure and the                            sedation options and risks were discussed with the                            patient. All questions were answered and informed                            consent was obtained.                           - ASA Grade Assessment: III - A patient with severe                            systemic disease.                           After obtaining informed consent, the endoscope was                            passed under direct vision. Throughout the  procedure, the patient's blood pressure, pulse, and                            oxygen saturations were monitored continuously. The                            [id number]-[id number] ([id number]) scope was introduced through the                            mouth, and advanced to the second part of duodenum.                            The upper GI endoscopy was accomplished  without                            difficulty. The patient tolerated the procedure                            well. Scope In: 12:02:21 PM Scope Out: 12:07:03 PM Total Procedure Duration: 0 hours 4 minutes 42 seconds  Findings:      No endoscopic abnormality was evident in the esophagus to explain the       patient's complaint of dysphagia. It was decided, however, to proceed       with dilation of the entire esophagus. A guidewire was placed and the       scope was withdrawn. Dilation was performed with a Savary dilator with       mild resistance at 18 mm. The dilation site was examined following       endoscope reinsertion and showed mild mucosal disruption.      The stomach was normal.      The examined duodenum was normal. Impression:               - No endoscopic esophageal abnormality to explain                            patient's dysphagia. Esophagus dilated. Dilated.                           - Normal stomach.                           - Normal examined duodenum.                           - No specimens collected. Moderate Sedation:      Per Anesthesia Care Recommendation:           - Discharge patient to home (ambulatory).                           - Resume previous diet.                           - Repeat upper endoscopy in 1 month for retreatment. Procedure Code(s):        --- Professional ---  16109, Esophagogastroduodenoscopy, flexible,                            transoral; with insertion of guide wire followed by                            passage of dilator(s) through esophagus over guide                            wire Diagnosis Code(s):        --- Professional ---                           R13.10, Dysphagia, unspecified CPT copyright 2022 American Medical Association. All rights reserved. The codes documented in this report are preliminary and upon coder review may  be revised to meet current compliance requirements. Samantha Cress,  MD Samantha Cress,  02/11/2024 12:16:25 PM This report has been signed electronically. Number of Addenda: 0

## 2024-02-11 NOTE — Discharge Instructions (Addendum)
 You are being discharged to home.  Resume your previous diet.  Your physician has recommended a repeat upper endoscopy in one month for retreatment.  Can take magic mouthwash for throat pain after procedure

## 2024-02-12 ENCOUNTER — Encounter (HOSPITAL_COMMUNITY): Payer: Self-pay | Admitting: Gastroenterology

## 2024-02-12 NOTE — Telephone Encounter (Signed)
 Dr. Sammi Crick, Do you want EGD or EGD W/ DIL? Thanks !

## 2024-02-13 NOTE — Anesthesia Postprocedure Evaluation (Signed)
 Anesthesia Post Note  Patient: Terry Allen  Procedure(s) Performed: EGD (ESOPHAGOGASTRODUODENOSCOPY) DILATION, ESOPHAGUS  Patient location during evaluation: Phase II Anesthesia Type: General Level of consciousness: awake Pain management: pain level controlled Vital Signs Assessment: post-procedure vital signs reviewed and stable Respiratory status: spontaneous breathing and respiratory function stable Cardiovascular status: blood pressure returned to baseline and stable Postop Assessment: no headache and no apparent nausea or vomiting Anesthetic complications: no Comments: Late entry   No notable events documented.   Last Vitals:  Vitals:   02/11/24 1123 02/11/24 1211  BP: 130/86 (!) 134/55  Pulse: 83 82  Resp: 13 (!) 25  Temp: 36.7 C 36.7 C  SpO2: 94% 99%    Last Pain:  Vitals:   02/11/24 1218  TempSrc:   PainSc: 0-No pain                 Coretha Dew

## 2024-02-14 NOTE — Telephone Encounter (Signed)
 EGD with dilation Thanks

## 2024-02-16 NOTE — Telephone Encounter (Signed)
 LMOVM to call back

## 2024-02-19 DIAGNOSIS — R300 Dysuria: Secondary | ICD-10-CM | POA: Diagnosis not present

## 2024-02-19 DIAGNOSIS — Z681 Body mass index (BMI) 19 or less, adult: Secondary | ICD-10-CM | POA: Diagnosis not present

## 2024-02-27 NOTE — Telephone Encounter (Signed)
 Called pt to schedule and she stated she can't schedule anything right now. She has uti and e coli and just don't want to right now. I advised her to call us  back when she was ready to schedule her repeat. She stated she would

## 2024-03-09 DIAGNOSIS — M479 Spondylosis, unspecified: Secondary | ICD-10-CM | POA: Diagnosis not present

## 2024-03-09 DIAGNOSIS — B9620 Unspecified Escherichia coli [E. coli] as the cause of diseases classified elsewhere: Secondary | ICD-10-CM | POA: Diagnosis not present

## 2024-03-09 DIAGNOSIS — N390 Urinary tract infection, site not specified: Secondary | ICD-10-CM | POA: Diagnosis not present

## 2024-03-09 DIAGNOSIS — M170 Bilateral primary osteoarthritis of knee: Secondary | ICD-10-CM | POA: Diagnosis not present

## 2024-03-09 DIAGNOSIS — R300 Dysuria: Secondary | ICD-10-CM | POA: Diagnosis not present

## 2024-03-09 DIAGNOSIS — Z681 Body mass index (BMI) 19 or less, adult: Secondary | ICD-10-CM | POA: Diagnosis not present

## 2024-03-31 DIAGNOSIS — N390 Urinary tract infection, site not specified: Secondary | ICD-10-CM | POA: Diagnosis not present

## 2024-03-31 DIAGNOSIS — M545 Low back pain, unspecified: Secondary | ICD-10-CM | POA: Diagnosis not present

## 2024-03-31 DIAGNOSIS — Z681 Body mass index (BMI) 19 or less, adult: Secondary | ICD-10-CM | POA: Diagnosis not present

## 2024-03-31 DIAGNOSIS — R300 Dysuria: Secondary | ICD-10-CM | POA: Diagnosis not present

## 2024-03-31 DIAGNOSIS — M79672 Pain in left foot: Secondary | ICD-10-CM | POA: Diagnosis not present

## 2024-04-01 ENCOUNTER — Ambulatory Visit: Payer: Medicare HMO | Admitting: Family Medicine

## 2024-04-15 DIAGNOSIS — R300 Dysuria: Secondary | ICD-10-CM | POA: Diagnosis not present

## 2024-04-15 DIAGNOSIS — E039 Hypothyroidism, unspecified: Secondary | ICD-10-CM | POA: Diagnosis not present

## 2024-04-15 DIAGNOSIS — N390 Urinary tract infection, site not specified: Secondary | ICD-10-CM | POA: Diagnosis not present

## 2024-04-15 DIAGNOSIS — B9620 Unspecified Escherichia coli [E. coli] as the cause of diseases classified elsewhere: Secondary | ICD-10-CM | POA: Diagnosis not present

## 2024-04-15 DIAGNOSIS — Z681 Body mass index (BMI) 19 or less, adult: Secondary | ICD-10-CM | POA: Diagnosis not present

## 2024-04-15 DIAGNOSIS — M2550 Pain in unspecified joint: Secondary | ICD-10-CM | POA: Diagnosis not present

## 2024-04-23 DIAGNOSIS — R102 Pelvic and perineal pain: Secondary | ICD-10-CM | POA: Diagnosis not present

## 2024-04-23 DIAGNOSIS — G8929 Other chronic pain: Secondary | ICD-10-CM | POA: Diagnosis not present

## 2024-04-23 DIAGNOSIS — R300 Dysuria: Secondary | ICD-10-CM | POA: Diagnosis not present

## 2024-04-28 ENCOUNTER — Ambulatory Visit: Admitting: Gastroenterology

## 2024-06-04 DIAGNOSIS — E876 Hypokalemia: Secondary | ICD-10-CM | POA: Diagnosis not present

## 2024-06-04 DIAGNOSIS — E7849 Other hyperlipidemia: Secondary | ICD-10-CM | POA: Diagnosis not present

## 2024-06-04 DIAGNOSIS — E039 Hypothyroidism, unspecified: Secondary | ICD-10-CM | POA: Diagnosis not present

## 2024-06-04 DIAGNOSIS — E559 Vitamin D deficiency, unspecified: Secondary | ICD-10-CM | POA: Diagnosis not present

## 2024-06-09 DIAGNOSIS — E782 Mixed hyperlipidemia: Secondary | ICD-10-CM | POA: Diagnosis not present

## 2024-06-09 DIAGNOSIS — Z1331 Encounter for screening for depression: Secondary | ICD-10-CM | POA: Diagnosis not present

## 2024-06-09 DIAGNOSIS — M479 Spondylosis, unspecified: Secondary | ICD-10-CM | POA: Diagnosis not present

## 2024-06-09 DIAGNOSIS — E039 Hypothyroidism, unspecified: Secondary | ICD-10-CM | POA: Diagnosis not present

## 2024-06-09 DIAGNOSIS — E876 Hypokalemia: Secondary | ICD-10-CM | POA: Diagnosis not present

## 2024-06-09 DIAGNOSIS — Z23 Encounter for immunization: Secondary | ICD-10-CM | POA: Diagnosis not present

## 2024-06-09 DIAGNOSIS — Z1389 Encounter for screening for other disorder: Secondary | ICD-10-CM | POA: Diagnosis not present

## 2024-06-09 DIAGNOSIS — Z0001 Encounter for general adult medical examination with abnormal findings: Secondary | ICD-10-CM | POA: Diagnosis not present

## 2024-06-09 DIAGNOSIS — E7849 Other hyperlipidemia: Secondary | ICD-10-CM | POA: Diagnosis not present

## 2024-06-15 ENCOUNTER — Other Ambulatory Visit (HOSPITAL_COMMUNITY): Payer: Self-pay | Admitting: Family Medicine

## 2024-06-15 DIAGNOSIS — R110 Nausea: Secondary | ICD-10-CM

## 2024-06-15 DIAGNOSIS — G8929 Other chronic pain: Secondary | ICD-10-CM

## 2024-06-24 DIAGNOSIS — Z681 Body mass index (BMI) 19 or less, adult: Secondary | ICD-10-CM | POA: Diagnosis not present

## 2024-06-24 DIAGNOSIS — R102 Pelvic and perineal pain unspecified side: Secondary | ICD-10-CM | POA: Diagnosis not present

## 2024-06-24 DIAGNOSIS — M2550 Pain in unspecified joint: Secondary | ICD-10-CM | POA: Diagnosis not present

## 2024-06-24 DIAGNOSIS — R300 Dysuria: Secondary | ICD-10-CM | POA: Diagnosis not present

## 2024-06-29 ENCOUNTER — Ambulatory Visit (HOSPITAL_COMMUNITY)
Admission: RE | Admit: 2024-06-29 | Discharge: 2024-06-29 | Disposition: A | Discharge status: Home / Self Care | Source: Ambulatory Visit | Attending: Family Medicine | Admitting: Family Medicine

## 2024-06-29 DIAGNOSIS — R102 Pelvic and perineal pain unspecified side: Secondary | ICD-10-CM | POA: Insufficient documentation

## 2024-06-29 DIAGNOSIS — R110 Nausea: Secondary | ICD-10-CM | POA: Diagnosis not present

## 2024-06-29 DIAGNOSIS — G8929 Other chronic pain: Secondary | ICD-10-CM | POA: Insufficient documentation

## 2024-06-29 DIAGNOSIS — K5730 Diverticulosis of large intestine without perforation or abscess without bleeding: Secondary | ICD-10-CM | POA: Diagnosis not present

## 2024-06-29 MED ORDER — IOHEXOL 300 MG/ML  SOLN
100.0000 mL | Freq: Once | INTRAMUSCULAR | Status: AC | PRN
  Administered 2024-06-29: 80 mL via INTRAVENOUS

## 2024-07-13 DIAGNOSIS — K5900 Constipation, unspecified: Secondary | ICD-10-CM | POA: Diagnosis not present

## 2024-07-13 DIAGNOSIS — Z87440 Personal history of urinary (tract) infections: Secondary | ICD-10-CM | POA: Diagnosis not present

## 2024-07-13 DIAGNOSIS — Z79890 Hormone replacement therapy (postmenopausal): Secondary | ICD-10-CM | POA: Diagnosis not present

## 2024-07-13 DIAGNOSIS — H9193 Unspecified hearing loss, bilateral: Secondary | ICD-10-CM | POA: Diagnosis not present

## 2024-07-13 DIAGNOSIS — E785 Hyperlipidemia, unspecified: Secondary | ICD-10-CM | POA: Diagnosis not present

## 2024-07-13 DIAGNOSIS — Z833 Family history of diabetes mellitus: Secondary | ICD-10-CM | POA: Diagnosis not present

## 2024-07-13 DIAGNOSIS — G629 Polyneuropathy, unspecified: Secondary | ICD-10-CM | POA: Diagnosis not present

## 2024-07-13 DIAGNOSIS — R32 Unspecified urinary incontinence: Secondary | ICD-10-CM | POA: Diagnosis not present

## 2024-07-13 DIAGNOSIS — E039 Hypothyroidism, unspecified: Secondary | ICD-10-CM | POA: Diagnosis not present

## 2024-07-15 ENCOUNTER — Encounter (INDEPENDENT_AMBULATORY_CARE_PROVIDER_SITE_OTHER): Payer: Self-pay | Admitting: Gastroenterology

## 2024-09-03 ENCOUNTER — Emergency Department (HOSPITAL_COMMUNITY)
Admission: EM | Admit: 2024-09-03 | Discharge: 2024-09-03 | Disposition: A | Discharge status: Home / Self Care | Source: Ambulatory Visit | Attending: Emergency Medicine | Admitting: Emergency Medicine

## 2024-09-03 ENCOUNTER — Encounter (HOSPITAL_COMMUNITY): Payer: Self-pay

## 2024-09-03 ENCOUNTER — Other Ambulatory Visit: Payer: Self-pay

## 2024-09-03 ENCOUNTER — Emergency Department (HOSPITAL_COMMUNITY)

## 2024-09-03 DIAGNOSIS — N390 Urinary tract infection, site not specified: Secondary | ICD-10-CM | POA: Insufficient documentation

## 2024-09-03 DIAGNOSIS — R079 Chest pain, unspecified: Secondary | ICD-10-CM | POA: Diagnosis present

## 2024-09-03 DIAGNOSIS — G4700 Insomnia, unspecified: Secondary | ICD-10-CM | POA: Diagnosis not present

## 2024-09-03 LAB — URINALYSIS, ROUTINE W REFLEX MICROSCOPIC
Bilirubin Urine: NEGATIVE
Glucose, UA: NEGATIVE mg/dL
Ketones, ur: 5 mg/dL — AB
Nitrite: NEGATIVE
Protein, ur: 30 mg/dL — AB
Specific Gravity, Urine: 1.019 (ref 1.005–1.030)
WBC, UA: >50 WBC/hpf (ref 0–5)
pH: 5 (ref 5.0–8.0)

## 2024-09-03 LAB — BASIC METABOLIC PANEL WITH GFR
Anion gap: 16 — ABNORMAL HIGH (ref 5–15)
BUN: 25 mg/dL — ABNORMAL HIGH (ref 8–23)
CO2: 24 mmol/L (ref 22–32)
Calcium: 9.4 mg/dL (ref 8.9–10.3)
Chloride: 97 mmol/L — ABNORMAL LOW (ref 98–111)
Creatinine, Ser: 0.71 mg/dL (ref 0.44–1.00)
GFR, Estimated: >60 mL/min
Glucose, Bld: 99 mg/dL (ref 70–99)
Potassium: 3.6 mmol/L (ref 3.5–5.1)
Sodium: 138 mmol/L (ref 135–145)

## 2024-09-03 LAB — CBC
HCT: 42.7 % (ref 36.0–46.0)
Hemoglobin: 13.6 g/dL (ref 12.0–15.0)
MCH: 31.3 pg (ref 26.0–34.0)
MCHC: 31.9 g/dL (ref 30.0–36.0)
MCV: 98.4 fL (ref 80.0–100.0)
Platelets: 276 K/uL (ref 150–400)
RBC: 4.34 MIL/uL (ref 3.87–5.11)
RDW: 13.7 % (ref 11.5–15.5)
WBC: 5.6 K/uL (ref 4.0–10.5)
nRBC: 0 % (ref 0.0–0.2)

## 2024-09-03 LAB — RESP PANEL BY RT-PCR (RSV, FLU A&B, COVID)  RVPGX2
Influenza A by PCR: NEGATIVE
Influenza B by PCR: NEGATIVE
Resp Syncytial Virus by PCR: NEGATIVE
SARS Coronavirus 2 by RT PCR: NEGATIVE

## 2024-09-03 LAB — TROPONIN T, HIGH SENSITIVITY
Troponin T High Sensitivity: <15 ng/L (ref 0–19)
Troponin T High Sensitivity: <15 ng/L (ref 0–19)

## 2024-09-03 MED ORDER — ONDANSETRON HCL 4 MG/2ML IJ SOLN
4.0000 mg | Freq: Once | INTRAMUSCULAR | Status: AC
  Administered 2024-09-03: 4 mg via INTRAVENOUS
  Filled 2024-09-03: qty 2

## 2024-09-03 MED ORDER — CEPHALEXIN 500 MG PO CAPS: 500.0000 mg | ORAL_CAPSULE | Freq: Two times a day (BID) | ORAL | 0 refills | Status: AC

## 2024-09-03 NOTE — ED Triage Notes (Signed)
 Pt reports intermittent mid center chest pain. Pt states this has been going on for over 6 months. Pt states yesterday was unable to sleep from the pain and when calling her PCP this morning they informed her to come to the hospital and be checked out.

## 2024-09-03 NOTE — Discharge Instructions (Addendum)
 Your urinalysis today shows signs of a urinary tract infection, start Keflex  and take 1 tablet by mouth twice daily for 7 days.  Continue to drink an adequate amount of water  daily to avoid dehydration. I have provided you with a referral to a cardiologist, their office will be in contact with you to schedule follow-up in regard to your recurrent chest pain that has been ongoing for 6 months, however if you do not hear from them please contact their office to schedule follow-up. You may try melatonin for relief of your insomnia, this is a supplement that can be found over-the-counter.  Talk to your primary care provider about your symptoms if they persist.  Return to the emergency department if your symptoms worsen.

## 2024-09-03 NOTE — ED Provider Notes (Signed)
 " [place] EMERGENCY DEPARTMENT AT [facility] Provider Note   CSN: [id number] Arrival date & time: 09/03/24  1007     Patient presents with: Chest Pain   Terry Allen is a 83 y.o. female.   83 year old female presenting with chest pain.  Patient notes almost daily chest pain for 6 months, has not seen/discussed her symptoms with another provider.  Reports that her chest pain feels like something needs to come out, denies sharp pain and reports it is more like an ache.  Pain does not seem to be worse with activity or at rest.  Endorses worsening nausea but no vomiting, reports pelvic pain that has been ongoing for a long time.  Reports that she has been unable to sleep well for a long time, states this is not related to the chest pain but just another ongoing issue that is bothering her, she has not tried any medications to help with her insomnia.  Denies shortness of breath, cough, lower extremity edema, dysuria/hematuria.   Chest Pain      Prior to Admission medications  Medication Sig Start Date End Date Taking? Authorizing Provider  acetaminophen  (TYLENOL ) 325 MG tablet Take 2 tablets (650 mg total) by mouth every 6 (six) hours as needed for mild pain (or Fever >/= 101). 04/28/23   Del Wilhelmena Lloyd Sola, FNP  albuterol  (VENTOLIN  HFA) 108 (90 Base) MCG/ACT inhaler Inhale 2 puffs into the lungs every 6 (six) hours as needed for wheezing or shortness of breath. 06/16/23   Del Orbe Polanco, Iliana, FNP  amitriptyline  (ELAVIL ) 50 MG tablet Take 50 mg by mouth at bedtime.  04/12/18   [provider]  atorvastatin  (LIPITOR) 40 MG tablet Take 40 mg by mouth at bedtime.     [provider]  Calcium  Carb-Cholecalciferol (CALCIUM  600 + D PO) Take 1 tablet by mouth 2 (two) times a day.    [provider]  esomeprazole  (NEXIUM ) 40 MG capsule TAKE 1 CAPSULE BY MOUTH ONCE DAILY BEFORE BREAKFAST 01/13/24   Del Wilhelmena Lloyd, Jemison, FNP  gabapentin   (NEURONTIN ) 300 MG capsule Take 1 capsule (300 mg total) by mouth 3 (three) times daily. 04/01/22   Vicci Afton CROME, MD  levothyroxine  (SYNTHROID ) 75 MCG tablet Take 1 tablet by mouth once daily 01/13/24   Del Orbe Polanco, Iliana, FNP  magic mouthwash (lidocaine , diphenhydrAMINE , alum & mag hydroxide) suspension Swish and swallow 5 mLs 3 (three) times daily as needed for mouth pain (throat discomfort/pain). 02/11/24   Castaneda Mayorga, Daniel, MD  ondansetron  (ZOFRAN ) 4 MG tablet Take 1 tablet (4 mg total) by mouth every 8 (eight) hours as needed for nausea or vomiting. 01/13/24   Shirlean Therisa ORN, NP  sucralfate  (CARAFATE ) 1 g tablet Take 1 tablet by mouth twice daily 01/13/24   Del Orbe Polanco, Iliana, FNP    Allergies: Patient has no known allergies.    Review of Systems  Cardiovascular:  Positive for chest pain.    Updated Vital Signs  Vitals:   09/03/24 1330 09/03/24 1345 09/03/24 1400 09/03/24 1415  BP: 129/76 131/72 119/74 (!) 140/68  Pulse: 66 70 71 63  Resp: 14 16 16 15   Temp:      TempSrc:      SpO2: 98% 99% 100% 97%  Weight:      Height:         Physical Exam Vitals and nursing note reviewed.  HENT:     Head: Normocephalic.  Eyes:  Extraocular Movements: Extraocular movements intact.  Cardiovascular:     Rate and Rhythm: Normal rate and regular rhythm.     Heart sounds: Normal heart sounds.  Pulmonary:     Effort: Pulmonary effort is normal.     Breath sounds: Normal breath sounds.  Abdominal:     Palpations: Abdomen is soft.     Tenderness: There is no abdominal tenderness. There is no guarding.  Musculoskeletal:     Cervical back: Normal range of motion.     Right lower leg: No edema.     Left lower leg: No edema.     Comments: Moves all extremities spontaneously without difficulty  Skin:    General: Skin is warm and dry.  Neurological:     Mental Status: She is alert and oriented to person, place, and time.     (all labs ordered are listed, but only  abnormal results are displayed) Labs Reviewed  BASIC METABOLIC PANEL WITH GFR - Abnormal; Notable for the following components:      Result Value   Chloride 97 (*)    BUN 25 (*)    Anion gap 16 (*)    All other components within normal limits  RESP PANEL BY RT-PCR (RSV, FLU A&B, COVID)  RVPGX2  CBC  TROPONIN T, HIGH SENSITIVITY    EKG: EKG Interpretation Date/Time:  Friday September 03 2024 10:24:04 EST Ventricular Rate:  88 PR Interval:  160 QRS Duration:  114 QT Interval:  380 QTC Calculation: 459 R Axis:   65  Text Interpretation: Normal sinus rhythm Right atrial enlargement Right bundle branch block Non-specific ST-t changes Confirmed by Bernard Drivers ([id number]) on 09/03/2024 10:31:11 AM  Radiology: DG Chest 2 View Result Date: 09/03/2024 EXAM: 2 VIEW(S) XRAY OF THE CHEST 09/03/2024 10:46:36 AM COMPARISON: 06/16/2023 CLINICAL HISTORY: chest pain FINDINGS: LUNGS AND PLEURA: No focal pulmonary opacity. No pleural effusion. No pneumothorax. HEART AND MEDIASTINUM: No acute abnormality of the cardiac and mediastinal silhouettes. BONES AND SOFT TISSUES: Thoracolumbar scoliosis. IMPRESSION: 1. No acute findings. Electronically signed by: Donnice Mania MD 09/03/2024 11:52 AM EST RP Workstation: [id number]     Procedures   Medications Ordered in the ED  ondansetron  (ZOFRAN ) injection 4 mg (4 mg Intravenous Given 09/03/24 1256)                                    Medical Decision Making This patient presents to the ED for concern of chest pain, this involves an extensive number of treatment options, and is a complaint that carries with it a high risk of complications and morbidity.  The differential diagnosis includes ACS, stable versus unstable angina, GERD, muscle pain/strain/sprain, UTI    Co morbidities that complicate the patient evaluation  hyperlipidemia, GERD, recurrent UTIs   Lab Tests:  I Ordered, and personally interpreted labs.  The pertinent results include: CBC  within normal limits.  BMP with anion gap of 16.  COVID/flu/RSV negative.  Initial troponin <15, repeat remains unchanged.  Urinalysis with moderate leukocytes and many bacteria, will send for culture.   Imaging Studies ordered:  I ordered imaging studies including CXR  I independently visualized and interpreted imaging which showed 1. No acute findings.  I agree with the radiologist interpretation   Cardiac Monitoring: / EKG:  The patient was maintained on a cardiac monitor.  I personally viewed and interpreted the cardiac monitored which showed an underlying rhythm of: NSR  Problem List / ED Course / Critical interventions / Medication management  I ordered medication including Zofran  for nausea Reevaluation of the patient after these medicines showed that the patient resolved I have reviewed the patients home medicines and have made adjustments as needed    Test / Admission - Considered:  Physical exam is unremarkable as above, patient is well-appearing and in no acute distress.  Reports ongoing chest pain for 6 months, does not seem to be related to exertion, is not associated with shortness of breath or any other respiratory symptoms. Denies cardiac history. ACS workup is unremarkable as above, normal troponin x 2.  Patient did complain of some pelvic discomfort, abdominal exam is benign as above however she was found to have a urinary tract infection, she notes a frequent recurrence of UTIs.  Course of Keflex  prescribed for UTI.   Etiology of chest pain is unclear as above, patient has not been seen/evaluated by a cardiologist, which I feel would be a reasonable next step.  Will provide her with a referral to cardiology for follow-up in regard to her ongoing symptoms. Patient mentions struggling with insomnia, has not tried any medications for management of this.  I recommend that she discuss her symptoms with her primary care provider, she may try OTC melatonin in the meantime to  help with her insomnia. Return precautions discussed, patient and her family ember voiced understanding and are in agreement this plan, she is appropriate for discharge at this time. Staffed with Dr. Bernard     Amount and/or Complexity of Data Reviewed Labs: ordered. Radiology: ordered.  Risk Prescription drug management.        Final diagnoses:  Chest pain, unspecified type  Urinary tract infection with hematuria, site unspecified  Insomnia, unspecified type    ED Discharge Orders          Ordered    Ambulatory referral to Cardiology       Comments: If you have not heard from the Cardiology office within the next 72 hours please call [phone].   09/03/24 1358    cephALEXin  (KEFLEX ) 500 MG capsule  2 times daily        09/03/24 [address] PA-C 09/03/24 1749    Bernard Drivers, MD 09/04/24 1447  "

## 2024-09-05 LAB — URINE CULTURE: Culture: >=100000 — AB

## 2024-09-06 ENCOUNTER — Telehealth (HOSPITAL_BASED_OUTPATIENT_CLINIC_OR_DEPARTMENT_OTHER): Payer: Self-pay | Admitting: *Deleted

## 2024-09-06 NOTE — Telephone Encounter (Signed)
 Post ED Visit - Positive Culture Follow-up  Culture report reviewed by antimicrobial stewardship pharmacist: [facility] [x]  Leonor Bash, [place].D. []  Venetia Gully, Pharm.D., BCPS AQ-ID []  Garrel Crews, Pharm.D., BCPS []  Almarie Lunger, Pharm.D., BCPS []  [place], [address].D., BCPS, AAHIVP []  Rosaline Bihari, Pharm.D., BCPS, AAHIVP []  Vernell Meier, PharmD, BCPS []  Latanya Hint, PharmD, BCPS []  Donald Medley, PharmD, BCPS []  Rocky Bold, PharmD []  Dorothyann Alert, PharmD, BCPS []  Morene Babe, PharmD  [facility] []  Rosaline Edison, PharmD []  Romona Bliss, PharmD []  Dolphus Roller, PharmD []  Veva Seip, Rph []  Vernell Daunt) Leonce, PharmD []  Eva Allis, PharmD []  Rosaline Millet, PharmD []  Iantha Batch, PharmD []  Arvin Gauss, PharmD []  Wanda Hasting, PharmD []  Ronal Rav, PharmD []  Rocky Slade, PharmD []  Bard Jeans, PharmD   Positive urine culture Treated with cephalexin , organism sensitive to the same and no further patient follow-up is required at this time.  Terry Allen 09/06/2024, 12:00 PM

## 2024-09-15 ENCOUNTER — Encounter: Payer: Self-pay | Admitting: Family Medicine

## 2024-09-16 ENCOUNTER — Other Ambulatory Visit (HOSPITAL_COMMUNITY): Payer: Self-pay | Admitting: Family Medicine

## 2024-09-16 DIAGNOSIS — Z1231 Encounter for screening mammogram for malignant neoplasm of breast: Secondary | ICD-10-CM

## 2024-09-24 ENCOUNTER — Ambulatory Visit (HOSPITAL_COMMUNITY)

## 2024-09-27 ENCOUNTER — Ambulatory Visit (HOSPITAL_COMMUNITY)
Admission: RE | Admit: 2024-09-27 | Discharge: 2024-09-27 | Disposition: A | Source: Ambulatory Visit | Attending: Family Medicine | Admitting: Family Medicine

## 2024-09-27 ENCOUNTER — Encounter (HOSPITAL_COMMUNITY): Payer: Self-pay

## 2024-09-27 DIAGNOSIS — Z1231 Encounter for screening mammogram for malignant neoplasm of breast: Secondary | ICD-10-CM | POA: Diagnosis present
